# Patient Record
Sex: Male | Born: 1975
Health system: Southern US, Community
[De-identification: ages and names within clinical notes are randomized; demographics above are authoritative.]

## PROBLEM LIST (undated history)

## (undated) DIAGNOSIS — E1165 Type 2 diabetes mellitus with hyperglycemia: Secondary | ICD-10-CM

## (undated) DIAGNOSIS — I509 Heart failure, unspecified: Secondary | ICD-10-CM

## (undated) DIAGNOSIS — G4733 Obstructive sleep apnea (adult) (pediatric): Secondary | ICD-10-CM

## (undated) DIAGNOSIS — I4892 Unspecified atrial flutter: Secondary | ICD-10-CM

## (undated) DIAGNOSIS — Z950 Presence of cardiac pacemaker: Secondary | ICD-10-CM

## (undated) DIAGNOSIS — I2729 Other secondary pulmonary hypertension: Secondary | ICD-10-CM

## (undated) HISTORY — DX: Obstructive sleep apnea (adult) (pediatric): G47.33

## (undated) HISTORY — DX: Type 2 diabetes mellitus with hyperglycemia: E11.65

## (undated) HISTORY — DX: Other secondary pulmonary hypertension: I27.29

## (undated) HISTORY — DX: Unspecified atrial flutter: I48.92

## (undated) HISTORY — DX: Presence of cardiac pacemaker: Z95.0

---

## 2007-03-13 ENCOUNTER — Emergency Department (HOSPITAL_COMMUNITY): Admission: EM | Admit: 2007-03-13 | Discharge: 2007-03-13 | Payer: Self-pay | Admitting: Emergency Medicine

## 2007-05-14 ENCOUNTER — Ambulatory Visit: Payer: Self-pay | Admitting: Family Medicine

## 2008-01-12 ENCOUNTER — Ambulatory Visit: Payer: Self-pay | Admitting: Internal Medicine

## 2011-04-12 LAB — COMPREHENSIVE METABOLIC PANEL
ALT: 19
AST: 25
Albumin: 3.9
CO2: 29
Calcium: 9
Chloride: 103
GFR calc Af Amer: >60
GFR calc non Af Amer: >60
Sodium: 137

## 2011-04-12 LAB — CBC
Platelets: 291
RBC: 4.92
WBC: 7.9

## 2011-04-12 LAB — URINALYSIS, ROUTINE W REFLEX MICROSCOPIC
Bilirubin Urine: NEGATIVE
Glucose, UA: NEGATIVE
Hgb urine dipstick: NEGATIVE
Ketones, ur: NEGATIVE
Leukocytes, UA: NEGATIVE
Protein, ur: 30 — AB
pH: 7

## 2011-04-12 LAB — DIFFERENTIAL
Eosinophils Absolute: 0.2
Eosinophils Relative: 2
Lymphs Abs: 1.4
Monocytes Absolute: 0.3

## 2011-04-12 LAB — URINE MICROSCOPIC-ADD ON

## 2011-04-12 LAB — RAPID URINE DRUG SCREEN, HOSP PERFORMED
Amphetamines: NOT DETECTED
Tetrahydrocannabinol: NOT DETECTED

## 2020-03-16 ENCOUNTER — Other Ambulatory Visit: Payer: Self-pay

## 2020-03-16 ENCOUNTER — Observation Stay (HOSPITAL_COMMUNITY)
Admission: EM | Admit: 2020-03-16 | Discharge: 2020-03-17 | Disposition: A | Discharge status: Home / Self Care | Payer: No Typology Code available for payment source | Attending: Family Medicine | Admitting: Family Medicine

## 2020-03-16 ENCOUNTER — Encounter (HOSPITAL_COMMUNITY): Payer: Self-pay

## 2020-03-16 DIAGNOSIS — H748X2 Other specified disorders of left middle ear and mastoid: Secondary | ICD-10-CM | POA: Diagnosis not present

## 2020-03-16 DIAGNOSIS — R0689 Other abnormalities of breathing: Secondary | ICD-10-CM

## 2020-03-16 DIAGNOSIS — R0602 Shortness of breath: Secondary | ICD-10-CM | POA: Diagnosis not present

## 2020-03-16 DIAGNOSIS — Z20822 Contact with and (suspected) exposure to covid-19: Secondary | ICD-10-CM | POA: Insufficient documentation

## 2020-03-16 DIAGNOSIS — J36 Peritonsillar abscess: Principal | ICD-10-CM

## 2020-03-16 DIAGNOSIS — D72829 Elevated white blood cell count, unspecified: Secondary | ICD-10-CM

## 2020-03-16 DIAGNOSIS — H9202 Otalgia, left ear: Secondary | ICD-10-CM | POA: Diagnosis present

## 2020-03-16 NOTE — ED Triage Notes (Addendum)
Pt arrives to ED w/ c/o L ear pain. Pt reports 8/10 pain. Pt also endorses sore throat. Pt denies fevers. Resp e/u.

## 2020-03-17 ENCOUNTER — Other Ambulatory Visit: Payer: Self-pay

## 2020-03-17 ENCOUNTER — Emergency Department (HOSPITAL_COMMUNITY): Payer: No Typology Code available for payment source

## 2020-03-17 ENCOUNTER — Encounter (HOSPITAL_COMMUNITY): Payer: Self-pay | Admitting: Family Medicine

## 2020-03-17 DIAGNOSIS — D72829 Elevated white blood cell count, unspecified: Secondary | ICD-10-CM

## 2020-03-17 DIAGNOSIS — J36 Peritonsillar abscess: Secondary | ICD-10-CM

## 2020-03-17 LAB — CBC WITH DIFFERENTIAL/PLATELET
Abs Immature Granulocytes: 0.05 10*3/uL (ref 0.00–0.07)
Basophils Absolute: 0.1 10*3/uL (ref 0.0–0.1)
Basophils Relative: 1 %
Eosinophils Absolute: 0.1 10*3/uL (ref 0.0–0.5)
Eosinophils Relative: 1 %
HCT: 43.7 % (ref 39.0–52.0)
Hemoglobin: 14 g/dL (ref 13.0–17.0)
Immature Granulocytes: 0 %
Lymphocytes Relative: 22 %
Lymphs Abs: 2.7 10*3/uL (ref 0.7–4.0)
MCH: 29.8 pg (ref 26.0–34.0)
MCHC: 32 g/dL (ref 30.0–36.0)
MCV: 93 fL (ref 80.0–100.0)
Monocytes Absolute: 0.7 10*3/uL (ref 0.1–1.0)
Monocytes Relative: 6 %
Neutro Abs: 8.7 10*3/uL — ABNORMAL HIGH (ref 1.7–7.7)
Neutrophils Relative %: 70 %
Platelets: 275 10*3/uL (ref 150–400)
RBC: 4.7 MIL/uL (ref 4.22–5.81)
RDW: 14.7 % (ref 11.5–15.5)
WBC: 12.4 10*3/uL — ABNORMAL HIGH (ref 4.0–10.5)
nRBC: 0 % (ref 0.0–0.2)

## 2020-03-17 LAB — HIV ANTIBODY (ROUTINE TESTING W REFLEX): HIV Screen 4th Generation wRfx: NONREACTIVE

## 2020-03-17 LAB — BASIC METABOLIC PANEL
Anion gap: 8 (ref 5–15)
BUN: 11 mg/dL (ref 6–20)
CO2: 24 mmol/L (ref 22–32)
Calcium: 8.6 mg/dL — ABNORMAL LOW (ref 8.9–10.3)
Chloride: 101 mmol/L (ref 98–111)
Creatinine, Ser: 0.76 mg/dL (ref 0.61–1.24)
GFR calc Af Amer: >60 mL/min (ref >60)
GFR calc non Af Amer: >60 mL/min (ref >60)
Glucose, Bld: 172 mg/dL — ABNORMAL HIGH (ref 70–99)
Potassium: 3.9 mmol/L (ref 3.5–5.1)
Sodium: 133 mmol/L — ABNORMAL LOW (ref 135–145)

## 2020-03-17 LAB — GROUP A STREP BY PCR: Group A Strep by PCR: DETECTED — AB

## 2020-03-17 LAB — SARS CORONAVIRUS 2 BY RT PCR (HOSPITAL ORDER, PERFORMED IN ~~LOC~~ HOSPITAL LAB): SARS Coronavirus 2: NEGATIVE

## 2020-03-17 MED ORDER — ACETAMINOPHEN 325 MG PO TABS: 650.0000 mg | ORAL_TABLET | Freq: Four times a day (QID) | ORAL | Status: DC | PRN

## 2020-03-17 MED ORDER — IOHEXOL 300 MG/ML  SOLN
75.0000 mL | Freq: Once | INTRAMUSCULAR | Status: AC | PRN
  Administered 2020-03-17: 75 mL via INTRAVENOUS

## 2020-03-17 MED ORDER — SODIUM CHLORIDE 0.9 % IV BOLUS
500.0000 mL | Freq: Once | INTRAVENOUS | Status: AC
  Administered 2020-03-17: 500 mL via INTRAVENOUS

## 2020-03-17 MED ORDER — DEXAMETHASONE SODIUM PHOSPHATE 10 MG/ML IJ SOLN
10.0000 mg | Freq: Two times a day (BID) | INTRAMUSCULAR | Status: DC
  Administered 2020-03-17: 10 mg via INTRAVENOUS
  Filled 2020-03-17 (×2): qty 1

## 2020-03-17 MED ORDER — ACETAMINOPHEN 650 MG RE SUPP: 650.0000 mg | Freq: Four times a day (QID) | RECTAL | Status: DC | PRN

## 2020-03-17 MED ORDER — DEXAMETHASONE SODIUM PHOSPHATE 10 MG/ML IJ SOLN
10.0000 mg | Freq: Once | INTRAMUSCULAR | Status: AC
  Administered 2020-03-17: 10 mg via INTRAVENOUS
  Filled 2020-03-17: qty 1

## 2020-03-17 MED ORDER — CLINDAMYCIN HCL 300 MG PO CAPS: 300.0000 mg | ORAL_CAPSULE | Freq: Four times a day (QID) | ORAL | 0 refills | Status: AC

## 2020-03-17 MED ORDER — HYDROMORPHONE HCL 1 MG/ML IJ SOLN
1.0000 mg | INTRAMUSCULAR | Status: DC | PRN
  Administered 2020-03-17: 1 mg via INTRAVENOUS
  Filled 2020-03-17: qty 1

## 2020-03-17 MED ORDER — CLINDAMYCIN PHOSPHATE 600 MG/50ML IV SOLN: 600.0000 mg | Freq: Three times a day (TID) | INTRAVENOUS | Status: DC

## 2020-03-17 MED ORDER — CLINDAMYCIN PHOSPHATE 600 MG/50ML IV SOLN
600.0000 mg | Freq: Once | INTRAVENOUS | Status: AC
  Administered 2020-03-17: 600 mg via INTRAVENOUS
  Filled 2020-03-17: qty 50

## 2020-03-17 NOTE — Consult Note (Signed)
Reason for Consult: Left peritonsillar abscess Referring Physician: Dierdre Forth, PA-C  HPI:  Kyle Pearson is an 44 y.o. male who was admitted this morning for treatment of his acute tonsillitis and left peritonsillar abscess. He was complaining of gradual, persistent, progressively worsening of left-sided sore throat for 3 days. Associated symptoms include left otalgia with onset 24 hours ago.  Patient reports pain worsens with talking, swallowing and chewing. He reports his throat feels swollen and his voice has changed.  Additionally endorses left cervical adenopathy.  Patient denies fevers, chills, neck pain, neck stiffness or other concerns.  He is fully vaccinated for Covid. His CT scan shows acute left tonsillitis and a small 10 mm left peritonsillar abscess. The patient was admitted due to his inability to handle his secretion.  History reviewed. No pertinent past medical history.  History reviewed. No pertinent surgical history.  History reviewed. No pertinent family history.  Social History:  reports that he quit smoking about 20 months ago. He has never used smokeless tobacco. He reports previous alcohol use. He reports that he does not use drugs.  Allergies:  Allergies  Allergen Reactions  . Penicillins     Prior to Admission medications   Not on File    Medications:  I have reviewed the patient's current medications. Scheduled: . dexamethasone (DECADRON) injection  10 mg Intravenous Q12H   Continuous: . clindamycin (CLEOCIN) IV      Results for orders placed or performed during the hospital encounter of 03/16/20 (from the past 48 hour(s))  SARS Coronavirus 2 by RT PCR (hospital order, performed in [facility] hospital lab) Nasopharyngeal Nasopharyngeal Swab     Status: None   Collection Time: 03/17/20  1:20 AM   Specimen: Nasopharyngeal Swab  Result Value Ref Range   SARS Coronavirus 2 NEGATIVE NEGATIVE    Comment: (NOTE) SARS-CoV-2 target nucleic  acids are NOT DETECTED.  The SARS-CoV-2 RNA is generally detectable in upper and lower respiratory specimens during the acute phase of infection. The lowest concentration of SARS-CoV-2 viral copies this assay can detect is 250 copies / mL. A negative result does not preclude SARS-CoV-2 infection and should not be used as the sole basis for treatment or other patient management decisions.  A negative result may occur with improper specimen collection / handling, submission of specimen other than nasopharyngeal swab, presence of viral mutation(s) within the areas targeted by this assay, and inadequate number of viral copies (<250 copies / mL). A negative result must be combined with clinical observations, patient history, and epidemiological information.  Fact Sheet for Patients:   BoilerBrush.com.cy  Fact Sheet for Healthcare Providers: https://pope.com/  This test is not yet approved or  cleared by the [place] FDA and has been authorized for detection and/or diagnosis of SARS-CoV-2 by FDA under an Emergency Use Authorization (EUA).  This EUA will remain in effect (meaning this test can be used) for the duration of the COVID-19 declaration under Section 564(b)(1) of the Act, 21 U.S.C. section 360bbb-3(b)(1), unless the authorization is terminated or revoked sooner.  Performed at [facility], [address] [place], [place] [address]   CBC with Differential     Status: Abnormal   Collection Time: 03/17/20  1:20 AM  Result Value Ref Range   WBC 12.4 (H) 4.0 - 10.5 K/uL   RBC 4.70 4.22 - 5.81 MIL/uL   Hemoglobin 14.0 13.0 - 17.0 g/dL   HCT 56.3 39 - 52 %   MCV 93.0 80.0 -  100.0 fL   MCH 29.8 26.0 - 34.0 pg   MCHC 32.0 30.0 - 36.0 g/dL   RDW 20.9 47.0 - 96.2 %   Platelets 275 150 - 400 K/uL   nRBC 0.0 0.0 - 0.2 %   Neutrophils Relative % 70 %   Neutro Abs 8.7 (H) 1.7 - 7.7 K/uL   Lymphocytes Relative 22 %   Lymphs  Abs 2.7 0.7 - 4.0 K/uL   Monocytes Relative 6 %   Monocytes Absolute 0.7 0 - 1 K/uL   Eosinophils Relative 1 %   Eosinophils Absolute 0.1 0 - 0 K/uL   Basophils Relative 1 %   Basophils Absolute 0.1 0 - 0 K/uL   Immature Granulocytes 0 %   Abs Immature Granulocytes 0.05 0.00 - 0.07 K/uL    Comment: Performed at [facility], [address], [place], [place] [address]  Basic metabolic panel     Status: Abnormal   Collection Time: 03/17/20  1:20 AM  Result Value Ref Range   Sodium 133 (L) 135 - 145 mmol/L   Potassium 3.9 3.5 - 5.1 mmol/L   Chloride 101 98 - 111 mmol/L   CO2 24 22 - 32 mmol/L   Glucose, Bld 172 (H) 70 - 99 mg/dL    Comment: Glucose reference range applies only to samples taken after fasting for at least 8 hours.   BUN 11 6 - 20 mg/dL   Creatinine, Ser 9.47 0.61 - 1.24 mg/dL   Calcium 8.6 (L) 8.9 - 10.3 mg/dL   GFR calc non Af Amer >60 >60 mL/min   GFR calc Af Amer >60 >60 mL/min   Anion gap 8 5 - 15    Comment: Performed at [facility], [address] [place], [place] [address]  Group A Strep by PCR     Status: Abnormal   Collection Time: 03/17/20  1:22 AM   Specimen: Nasopharyngeal Swab; Sterile Swab  Result Value Ref Range   Group A Strep by PCR DETECTED (A) NOT DETECTED    Comment: Performed at [facility], [address], [place], [place] [address]    CT Soft Tissue Neck W Contrast  Result Date: 03/17/2020 CLINICAL DATA:  Left ear pain and sore throat EXAM: CT NECK WITH CONTRAST TECHNIQUE: Multidetector CT imaging of the neck was performed using the standard protocol following the bolus administration of intravenous contrast. CONTRAST:  71mL OMNIPAQUE IOHEXOL 300 MG/ML  SOLN COMPARISON:  None. FINDINGS: Pharynx and larynx: The left palatine tonsil is enlarged with ill-defined fluid collection measuring 10 mm. There is a small retropharyngeal effusion but no retropharyngeal abscess. The epiglottis is normal. Normal larynx. Salivary glands: No  inflammation, mass, or stone. Thyroid: Normal. Lymph nodes: Reactive left level 2A cervical lymphadenopathy. Vascular: Negative. Limited intracranial: Negative. Visualized orbits: Negative. Mastoids and visualized paranasal sinuses: Clear. Skeleton: No acute or aggressive process. Upper chest: Negative. Other: None. IMPRESSION: 1. Acute left palatine tonsillitis with 10 mm left peritonsillar abscess. 2. Small retropharyngeal effusion but no retropharyngeal abscess. 3. Reactive left level 2A cervical lymphadenopathy. Electronically Signed   By: Deatra Robinson M.D.   On: 03/17/2020 03:08   Review of Systems  Constitutional: Negative for appetite change, diaphoresis, fatigue, fever and unexpected weight change.  HENT: Positive for ear pain, sore throat, trouble swallowing and voice change. Negative for mouth sores.   Eyes: Negative for visual disturbance.  Respiratory: Negative for cough, chest tightness, shortness of breath and wheezing.  Cardiovascular: Negative for chest pain.  Gastrointestinal: Negative for abdominal pain, constipation, diarrhea, nausea and vomiting.  Endocrine: Negative for polydipsia, polyphagia and polyuria.  Genitourinary: Negative for dysuria, frequency, hematuria and urgency.  Musculoskeletal: Negative for back pain and neck stiffness.  Skin: Negative for rash.  Allergic/Immunologic: Negative for immunocompromised state.  Neurological: Negative for syncope, light-headedness and headaches.  Hematological: Positive for adenopathy. Does not bruise/bleed easily.  Psychiatric/Behavioral: Negative for sleep disturbance. The patient is not nervous/anxious.    Blood pressure 126/87, pulse 94, temperature 98 F (36.7 C), temperature source Oral, resp. rate 18, weight 126 kg, SpO2 95 %. General appearance: alert, cooperative and appears stated age Eyes: Pupils are equal, round, reactive to light. Extraocular motion is intact.  Ears: Examination of the ears shows normal auricles  and external auditory canals bilaterally.  Nose: Nasal examination shows normal mucosa, septum, turbinates.  Face: Facial examination shows no asymmetry. Palpation of the face elicit no significant tenderness.  Mouth: Oral cavity examination shows edematous and erythematous left tonsil. No significant uvula deviation. Neck: No mass or lesion. The trachea is midline. The thyroid is not significantly enlarged.  Neuro: Cranial nerves 2-12 are all grossly in tact.  Assessment/Plan: Left acute tonsillitis with a small peritonsillar abscess. No previous history of recurrent tonsillitis. - Pt reports significant improvement in his throat pain after IV clindamycin and decadron. - May d/c home with clindamycin 300mg  QID for 10 days. - Pt may follow up with me as an outpatient PRN.  Aarish Rockers W Lynsee Wands 03/17/2020, 7:25 AM

## 2020-03-17 NOTE — Discharge Summary (Signed)
Physician Discharge Summary  Kyle Pearson OYD:[id number] DOB: [birth year] DOA: 03/16/2020  PCP: Patient, No Pcp Per  Admit date: 03/16/2020 Discharge date: 03/17/2020  Time spent: 50 minutes  Recommendations for Outpatient Follow-up:  1. Follow-up ENT as needed  Discharge Diagnoses:  Principal Problem:   Peritonsillar abscess Active Problems:   Leukocytosis   Discharge Condition: Stable  Diet recommendation: Regular diet  Filed Weights   03/17/20 0659  Weight: 126 kg    History of present illness:  44 year old male with no medical problems presented with gradually worsening sore throat and swelling mainly on left side.  Patient said that he had sore throat for about a week and for last 3 days pain has become severe and also involved pain in the left ear.  He also had difficulty with swallowing. In the ED CT of the neck showed 10 mm peritonsillar abscess with retropharyngeal effusion but no retropharyngeal abscess on CT scan.  Patient was given Decadron 10 mg IV and started on clindamycin.  ENT was consulted.  Hospital Course:  Left acute tonsillitis/small peritonsillar abscess-patient was seen by ENT, patient had significantly improved with clindamycin and Decadron.  ENT recommends to discharge home on 300 mg p.o. 4 times daily for 10 days.  Patient can follow-up with Dr. Suszanne Conners , ENT as outpatient as needed. Will discharge on clindamycin 3 mg p.o. 4 times daily for 10 days.  Procedures:    Consultations:  ENT  Discharge Exam: Vitals:   03/17/20 0625 03/17/20 0659  BP: (!) 118/93 126/87  Pulse: 93 94  Resp: 17 18  Temp: 99 F (37.2 C) 98 F (36.7 C)  SpO2: 94% 95%    General: Appears in no acute distress Cardiovascular: S1-S2, regular Respiratory: Clear to auscultation bilaterally  Discharge Instructions   Discharge Instructions    Diet - low sodium heart healthy   Complete by: As directed    Increase activity slowly   Complete by: As directed       Allergies as of 03/17/2020      Reactions   Penicillins       Medication List    TAKE these medications   clindamycin 300 MG capsule Commonly known as: CLEOCIN Take 1 capsule (300 mg total) by mouth 4 (four) times daily for 10 days.      Allergies  Allergen Reactions   Penicillins     Follow-up Information    Newman Pies, MD Follow up.   Specialty: Otolaryngology Why: Call as needed Contact information: [address] [place] [address] [phone]                The results of significant diagnostics from this hospitalization (including imaging, microbiology, ancillary and laboratory) are listed below for reference.    Significant Diagnostic Studies: CT Soft Tissue Neck W Contrast  Result Date: 03/17/2020 CLINICAL DATA:  Left ear pain and sore throat EXAM: CT NECK WITH CONTRAST TECHNIQUE: Multidetector CT imaging of the neck was performed using the standard protocol following the bolus administration of intravenous contrast. CONTRAST:  96mL OMNIPAQUE IOHEXOL 300 MG/ML  SOLN COMPARISON:  None. FINDINGS: Pharynx and larynx: The left palatine tonsil is enlarged with ill-defined fluid collection measuring 10 mm. There is a small retropharyngeal effusion but no retropharyngeal abscess. The epiglottis is normal. Normal larynx. Salivary glands: No inflammation, mass, or stone. Thyroid: Normal. Lymph nodes: Reactive left level 2A cervical lymphadenopathy. Vascular: Negative. Limited intracranial: Negative. Visualized orbits: Negative. Mastoids and visualized paranasal sinuses: Clear.  Skeleton: No acute or aggressive process. Upper chest: Negative. Other: None. IMPRESSION: 1. Acute left palatine tonsillitis with 10 mm left peritonsillar abscess. 2. Small retropharyngeal effusion but no retropharyngeal abscess. 3. Reactive left level 2A cervical lymphadenopathy. Electronically Signed   By: Deatra Robinson M.D.   On: 03/17/2020 03:08    Microbiology: Recent Results (from  the past 240 hour(s))  SARS Coronavirus 2 by RT PCR (hospital order, performed in [facility] hospital lab) Nasopharyngeal Nasopharyngeal Swab     Status: None   Collection Time: 03/17/20  1:20 AM   Specimen: Nasopharyngeal Swab  Result Value Ref Range Status   SARS Coronavirus 2 NEGATIVE NEGATIVE Final    Comment: (NOTE) SARS-CoV-2 target nucleic acids are NOT DETECTED.  The SARS-CoV-2 RNA is generally detectable in upper and lower respiratory specimens during the acute phase of infection. The lowest concentration of SARS-CoV-2 viral copies this assay can detect is 250 copies / mL. A negative result does not preclude SARS-CoV-2 infection and should not be used as the sole basis for treatment or other patient management decisions.  A negative result may occur with improper specimen collection / handling, submission of specimen other than nasopharyngeal swab, presence of viral mutation(s) within the areas targeted by this assay, and inadequate number of viral copies (<250 copies / mL). A negative result must be combined with clinical observations, patient history, and epidemiological information.  Fact Sheet for Patients:   BoilerBrush.com.cy  Fact Sheet for Healthcare Providers: https://pope.com/  This test is not yet approved or  cleared by the [place] FDA and has been authorized for detection and/or diagnosis of SARS-CoV-2 by FDA under an Emergency Use Authorization (EUA).  This EUA will remain in effect (meaning this test can be used) for the duration of the COVID-19 declaration under Section 564(b)(1) of the Act, 21 U.S.C. section 360bbb-3(b)(1), unless the authorization is terminated or revoked sooner.  Performed at [facility], [address], [place], [place] [address]   Group A Strep by PCR     Status: Abnormal   Collection Time: 03/17/20  1:22 AM   Specimen: Nasopharyngeal Swab; Sterile Swab  Result Value  Ref Range Status   Group A Strep by PCR DETECTED (A) NOT DETECTED Final    Comment: Performed at [facility], [address] [place], [place] [address]     Labs: Basic Metabolic Panel: Recent Labs  Lab 03/17/20 0120  NA 133*  K 3.9  CL 101  CO2 24  GLUCOSE 172*  BUN 11  CREATININE 0.76  CALCIUM 8.6*   CBC: Recent Labs  Lab 03/17/20 0120  WBC 12.4*  NEUTROABS 8.7*  HGB 14.0  HCT 43.7  MCV 93.0  PLT 275       Signed:  Meredeth Ide MD.  [facility] 03/17/2020, 11:48 AM

## 2020-03-17 NOTE — H&P (Signed)
History and Physical    Kyle Pearson GGY:[id number] DOB: 08/20/1975 DOA: 03/16/2020  PCP: Patient, No Pcp Per   Patient coming from: Home   Chief Complaint: Sore throat, pain with swallowing.   HPI: Kyle Pearson is a 44 y.o. male with no past medical conditions. He presents for evaluation of gradually worsening sore throat that is mostly in the left side.  He reports he has had a sore throat for about a week but the last 3 days the pain in his throat is become worse and he is also developed left ear pain.  He reports pain is worsened when he swallows and eats.  He reports he has not had food gets stuck in his throat.  He also reports it hurts when he lays flat and he feels like he has trouble breathing at times if he lays flat.  He has not had any bleeding or drainage from the back of his throat.  He denies any drainage from his left ear.  He denies any injury or trauma to his throat or ear.  He reports his voice has been hoarse the last 2 days.  He reports the pain is a severe pain that feels like a stabbing sensation when he swallows.  He reports that pain started in the middle of his throat and is moved over to the left side and he has swollen lymph nodes in his neck that are also tender to palpation.  He states he has not had any fevers, chills, chest pain, cough, shortness of breath, abdominal pain, nausea, vomiting, diarrhea. He reports that 2 of his children have had sore throat recently but they got better after a few days he states. He has used Tylenol and Motrin at home intermittently.  He reports he is fully vaccinated for Covid.  ED Course: He is found to have a significantly swollen left tonsil and posterior oropharynx and CT scan shows a 10 mm peritonsillar abscess with retropharyngeal effusion but no retropharyngeal abscess on CT scan.  Patient was given IV antibiotic and steroid in the emergency room.  ENT was consulted by the ER provider.  Hospital service been asked to  evaluate and assist with care of patient.  Review of Systems:  General: Denies weakness, fever, chills, weight loss, night sweats.  Denies dizziness.  Denies change in appetite HENT: Denies head trauma, headache, denies change in hearing, tinnitus.  Denies nasal congestion or bleeding.  Denies sore throat, sores in mouth.  Denies difficulty swallowing Eyes: Denies blurry vision, pain in eye, drainage.  Denies discoloration of eyes. Neck: Denies pain.  Denies swelling.  Denies pain with movement. Cardiovascular: Denies chest pain, palpitations.  Denies edema.  Denies orthopnea Respiratory: Denies shortness of breath, cough.  Denies wheezing.  Denies sputum production Gastrointestinal: Denies abdominal pain, swelling.  Denies nausea, vomiting, diarrhea.  Denies melena.  Denies hematemesis. Musculoskeletal: Denies limitation of movement.  Denies deformity or swelling.  Denies pain.  Denies arthralgias or myalgias. Genitourinary: Denies pelvic pain.  Denies urinary frequency or hesitancy.  Denies dysuria.  Skin: Denies rash.  Denies petechiae, purpura, ecchymosis. Neurological: Denies headache.  Denies syncope.  Denies seizure activity.  Denies weakness or paresthesia.  Denies slurred speech, drooping face.  Denies visual change. Psychiatric: Denies depression, anxiety.  Denies suicidal thoughts or ideation.  Denies hallucinations.  History reviewed. No pertinent past medical history.  History reviewed. No pertinent surgical history.  Social History  reports that he quit smoking about 20 months ago. He  has never used smokeless tobacco. He reports previous alcohol use. He reports that he does not use drugs.  Allergies  Allergen Reactions  . Penicillins     History reviewed. No pertinent family history.   Prior to Admission medications   Not on File    Physical Exam: Vitals:   03/17/20 0124 03/17/20 0130 03/17/20 0145 03/17/20 0204  BP:  (!) 121/102 (!) 129/103   Pulse: 99 95 94    Resp: (!) 23  20   Temp: 98.9 F (37.2 C)   98.2 F (36.8 C)  TempSrc: Oral   Oral  SpO2: 99% 98% 96%     Constitutional: NAD, calm, comfortable Vitals:   03/17/20 0124 03/17/20 0130 03/17/20 0145 03/17/20 0204  BP:  (!) 121/102 (!) 129/103   Pulse: 99 95 94   Resp: (!) 23  20   Temp: 98.9 F (37.2 C)   98.2 F (36.8 C)  TempSrc: Oral   Oral  SpO2: 99% 98% 96%    General: WDWN, Alert and oriented x3.  Eyes: EOMI, PERRL, lids and conjunctivae normal.  Sclera nonicteric HENT:  /AT, external ears normal.  Nares patent without epistasis.  Mucous membranes are moist. Posterior pharynx erythematous with left tonsil being profoundly swollen.  Oropharyngeal opening diminished secondary to swelling in posterior oropharynx.  No exudate or drainage noted.  Normal dentition.  Neck: Soft, normal range of motion, supple, no masses, Trachea midline.  Left anterior lymphadenopathy palpated and mildly tender Respiratory: clear to auscultation bilaterally, no wheezing, no crackles. Normal respiratory effort. No accessory muscle use.  Cardiovascular: Regular rate and rhythm, no murmurs / rubs / gallops. Gastrointestinal: Soft, nontender, nondistended, obese. No masses palpated. Bowel sounds normoactive Musculoskeletal: FROM. no clubbing / cyanosis. Normal muscle tone.  Skin: Warm, dry, intact no rashes, lesions, ulcers. No induration Neurologic: CN 2-12 grossly intact.  Normal speech. Strength 5/5 in all extremities.   Psychiatric: Normal judgment and insight.  Normal mood.    Labs on Admission: I have personally reviewed following labs and imaging studies  CBC: Recent Labs  Lab 03/17/20 0120  WBC 12.4*  NEUTROABS 8.7*  HGB 14.0  HCT 43.7  MCV 93.0  PLT 275    Basic Metabolic Panel: Recent Labs  Lab 03/17/20 0120  NA 133*  K 3.9  CL 101  CO2 24  GLUCOSE 172*  BUN 11  CREATININE 0.76  CALCIUM 8.6*    GFR: CrCl cannot be calculated (Unknown ideal weight.).  Liver  Function Tests: No results for input(s): AST, ALT, ALKPHOS, BILITOT, PROT, ALBUMIN in the last 168 hours.  Urine analysis:    Component Value Date/Time   COLORURINE YELLOW 03/13/2007 1222   APPEARANCEUR CLEAR 03/13/2007 1222   LABSPEC 1.025 03/13/2007 1222   PHURINE 7.0 03/13/2007 1222   GLUCOSEU NEGATIVE 03/13/2007 1222   HGBUR NEGATIVE 03/13/2007 1222   BILIRUBINUR NEGATIVE 03/13/2007 1222   KETONESUR NEGATIVE 03/13/2007 1222   PROTEINUR 30 (A) 03/13/2007 1222   UROBILINOGEN 0.2 03/13/2007 1222   NITRITE NEGATIVE 03/13/2007 1222   LEUKOCYTESUR NEGATIVE 03/13/2007 1222    Radiological Exams on Admission: CT Soft Tissue Neck W Contrast  Result Date: 03/17/2020 CLINICAL DATA:  Left ear pain and sore throat EXAM: CT NECK WITH CONTRAST TECHNIQUE: Multidetector CT imaging of the neck was performed using the standard protocol following the bolus administration of intravenous contrast. CONTRAST:  58mL OMNIPAQUE IOHEXOL 300 MG/ML  SOLN COMPARISON:  None. FINDINGS: Pharynx and larynx: The left palatine tonsil is enlarged  with ill-defined fluid collection measuring 10 mm. There is a small retropharyngeal effusion but no retropharyngeal abscess. The epiglottis is normal. Normal larynx. Salivary glands: No inflammation, mass, or stone. Thyroid: Normal. Lymph nodes: Reactive left level 2A cervical lymphadenopathy. Vascular: Negative. Limited intracranial: Negative. Visualized orbits: Negative. Mastoids and visualized paranasal sinuses: Clear. Skeleton: No acute or aggressive process. Upper chest: Negative. Other: None. IMPRESSION: 1. Acute left palatine tonsillitis with 10 mm left peritonsillar abscess. 2. Small retropharyngeal effusion but no retropharyngeal abscess. 3. Reactive left level 2A cervical lymphadenopathy. Electronically Signed   By: Deatra Robinson M.D.   On: 03/17/2020 03:08    Assessment/Plan Principal Problem:   Peritonsillar abscess Mr. Westley Hummer is placed on local surgical  floor for observation. He is started on clindamycin 600 mg IV every 12 hours antibiotic coverage.  He is also placed on Decadron 10 mg IV every 12 hours to help reduce inflammation. Is been consulted and will evaluate patient in the morning.  ER provider discussed with Dr. Suszanne Conners who stated he will see patient in the morning and did not think patient needed to go to the OR urgently. Patient placed on a soft diet secondary to pain with swallowing.  Active Problems:   Leukocytosis Check CBC in morning    DVT prophylaxis: Padua score is low.  DVT prophylaxis with TED hose and ambulation Code Status:   Full code Family Communication:  Diagnosis plan discussed with patient.  Patient verbalized understanding and agrees with plan.  Further recommendation to follow as clinically indicated Disposition Plan:   Patient is from:  Home  Anticipated DC to:  Home  Anticipated DC date:  Anticipate discharge in less than 2 midnights  Anticipated DC barriers: No barriers to discharge identified at this time  Consults called:  ENT, Dr. Suszanne Conners, consulted by ER provider and will see patient this am.  Admission status:  Observation  Severity of Illness: The appropriate patient status for this patient is OBSERVATION. Observation status is judged to be reasonable and necessary in order to provide the required intensity of service to ensure the patient's safety. The patient's presenting symptoms, physical exam findings, and initial radiographic and laboratory data in the context of their medical condition is felt to place them at decreased risk for further clinical deterioration. Furthermore, it is anticipated that the patient will be medically stable for discharge from the hospital within 2 midnights of admission. The following factors support the patient status of observation.     Claudean Severance Leisel Pinette MD [facility]  How to contact the [facility] Attending or Consulting provider 7A - 7P or covering provider during  after hours 7P -7A, for this patient?   1. Check the care team in [facility] and look for a) attending/consulting [facility] provider listed and b) the [facility] team listed 2. Log into [email or web address] and use [place]'s universal password to access. If you do not have the password, please contact the hospital operator. 3. Locate the [facility] provider you are looking for under [facility] and page to a number that you can be directly reached. 4. If you still have difficulty reaching the provider, please page the [facility] (Director on Call) for the Hospitalists listed on amion for assistance.  03/17/2020, 5:03 AM

## 2020-03-17 NOTE — ED Provider Notes (Signed)
[facility] EMERGENCY DEPARTMENT Provider Note   CSN: [id number] Arrival date & time: 03/16/20  1930     History Chief Complaint  Patient presents with  . Otalgia    Kyle Pearson is a 44 y.o. male with a hx of major medical problems presents to the Emergency Department complaining of gradual, persistent, progressively worsening left-sided sore throat onset 3 days ago. Associated symptoms include left otalgia onset 24 hours ago.  Patient reports pain worsens with talking, swallowing and chewing.  He reports severe pain described as stabbing with swallowing.  He reports his throat feels swollen and his voice is changed.  Additionally endorses left cervical adenopathy.  Patient denies fevers, chills, neck pain, neck stiffness or other concerns.  He is fully vaccinated for Covid.  No known sick contacts.  No treatments prior to arrival.   The history is provided by the patient and medical records. No language interpreter was used.       History reviewed. No pertinent past medical history.  Patient Active Problem List   Diagnosis Date Noted  . Peritonsillar abscess 03/17/2020  . Leukocytosis 03/17/2020    History reviewed. No pertinent surgical history.     No family history on file.  Social History   Tobacco Use  . Smoking status: Not on file  Substance Use Topics  . Alcohol use: Not on file  . Drug use: Not on file    Home Medications Prior to Admission medications   Not on File    Allergies    Penicillins  Review of Systems   Review of Systems  Constitutional: Negative for appetite change, diaphoresis, fatigue, fever and unexpected weight change.  HENT: Positive for ear pain, sore throat, trouble swallowing and voice change. Negative for mouth sores.   Eyes: Negative for visual disturbance.  Respiratory: Negative for cough, chest tightness, shortness of breath and wheezing.   Cardiovascular: Negative for chest pain.  Gastrointestinal:  Negative for abdominal pain, constipation, diarrhea, nausea and vomiting.  Endocrine: Negative for polydipsia, polyphagia and polyuria.  Genitourinary: Negative for dysuria, frequency, hematuria and urgency.  Musculoskeletal: Negative for back pain and neck stiffness.  Skin: Negative for rash.  Allergic/Immunologic: Negative for immunocompromised state.  Neurological: Negative for syncope, light-headedness and headaches.  Hematological: Positive for adenopathy. Does not bruise/bleed easily.  Psychiatric/Behavioral: Negative for sleep disturbance. The patient is not nervous/anxious.     Physical Exam Updated Vital Signs BP (!) 129/103   Pulse 94   Temp 98.2 F (36.8 C) (Oral)   Resp 20   SpO2 96%   Physical Exam Vitals and nursing note reviewed.  Constitutional:      General: He is not in acute distress.    Appearance: He is not diaphoretic.  HENT:     Head: Normocephalic.     Jaw: No trismus.     Right Ear: Tympanic membrane, ear canal and external ear normal. No middle ear effusion.     Left Ear: Ear canal and external ear normal. A middle ear effusion is present. No hemotympanum. Tympanic membrane is not erythematous, retracted or bulging.     Nose: Nose normal.     Right Sinus: No maxillary sinus tenderness or frontal sinus tenderness.     Left Sinus: No maxillary sinus tenderness or frontal sinus tenderness.     Mouth/Throat:     Lips: Pink.     Mouth: Mucous membranes are moist.     Pharynx: Pharyngeal swelling, posterior oropharyngeal erythema and uvula  swelling present. No oropharyngeal exudate.     Tonsils: No tonsillar exudate. 4+ on the left.   Eyes:     General: No scleral icterus.    Conjunctiva/sclera: Conjunctivae normal.  Neck:     Comments: Muffled voice Cardiovascular:     Rate and Rhythm: Normal rate and regular rhythm.     Pulses: Normal pulses.          Radial pulses are 2+ on the right side and 2+ on the left side.  Pulmonary:     Effort: No  tachypnea, accessory muscle usage, prolonged expiration, respiratory distress or retractions.     Breath sounds: No stridor.     Comments: Equal chest rise. No increased work of breathing. Abdominal:     General: There is no distension.     Palpations: Abdomen is soft.     Tenderness: There is no abdominal tenderness. There is no guarding or rebound.  Musculoskeletal:     Cervical back: Normal range of motion.     Comments: Moves all extremities equally and without difficulty.  Lymphadenopathy:     Cervical: Cervical adenopathy present.     Left cervical: Superficial cervical adenopathy present.  Skin:    General: Skin is warm and dry.     Capillary Refill: Capillary refill takes less than 2 seconds.  Neurological:     Mental Status: He is alert.     GCS: GCS eye subscore is 4. GCS verbal subscore is 5. GCS motor subscore is 6.     Comments: Speech is clear and goal oriented.  Psychiatric:        Mood and Affect: Mood normal.     ED Results / Procedures / Treatments   Labs (all labs ordered are listed, but only abnormal results are displayed) Labs Reviewed  GROUP A STREP BY PCR - Abnormal; Notable for the following components:      Result Value   Group A Strep by PCR DETECTED (*)    All other components within normal limits  CBC WITH DIFFERENTIAL/PLATELET - Abnormal; Notable for the following components:   WBC 12.4 (*)    Neutro Abs 8.7 (*)    All other components within normal limits  BASIC METABOLIC PANEL - Abnormal; Notable for the following components:   Sodium 133 (*)    Glucose, Bld 172 (*)    Calcium 8.6 (*)    All other components within normal limits  SARS CORONAVIRUS 2 BY RT PCR (HOSPITAL ORDER, PERFORMED IN [facility])     Radiology CT Soft Tissue Neck W Contrast  Result Date: 03/17/2020 CLINICAL DATA:  Left ear pain and sore throat EXAM: CT NECK WITH CONTRAST TECHNIQUE: Multidetector CT imaging of the neck was performed using the standard  protocol following the bolus administration of intravenous contrast. CONTRAST:  32mL OMNIPAQUE IOHEXOL 300 MG/ML  SOLN COMPARISON:  None. FINDINGS: Pharynx and larynx: The left palatine tonsil is enlarged with ill-defined fluid collection measuring 10 mm. There is a small retropharyngeal effusion but no retropharyngeal abscess. The epiglottis is normal. Normal larynx. Salivary glands: No inflammation, mass, or stone. Thyroid: Normal. Lymph nodes: Reactive left level 2A cervical lymphadenopathy. Vascular: Negative. Limited intracranial: Negative. Visualized orbits: Negative. Mastoids and visualized paranasal sinuses: Clear. Skeleton: No acute or aggressive process. Upper chest: Negative. Other: None. IMPRESSION: 1. Acute left palatine tonsillitis with 10 mm left peritonsillar abscess. 2. Small retropharyngeal effusion but no retropharyngeal abscess. 3. Reactive left level 2A cervical lymphadenopathy. Electronically Signed  By: Deatra Robinson M.D.   On: 03/17/2020 03:08    Procedures Procedures (including critical care time)  Medications Ordered in ED Medications  dexamethasone (DECADRON) injection 10 mg (has no administration in time range)  clindamycin (CLEOCIN) IVPB 600 mg (has no administration in time range)  sodium chloride 0.9 % bolus 500 mL (0 mLs Intravenous Stopped 03/17/20 0204)  iohexol (OMNIPAQUE) 300 MG/ML solution 75 mL (75 mLs Intravenous Contrast Given 03/17/20 0258)    ED Course  I have reviewed the triage vital signs and the nursing notes.  Pertinent labs & imaging results that were available during my care of the patient were reviewed by me and considered in my medical decision making (see chart for details).    MDM Rules/Calculators/A&P                           Presents with unilateral throat swelling and otalgia.  Significant swelling and deviation of the uvula with voice changes.  Concern for possible peritonsillar abscess however with severe swelling and uvula deviation  will obtain CT scan to ensure no deep space infections.  3:45AM CT shows 43mm abscess with edema of the retropharyngeal space.  I personally evaluated he images. Upon reassessment, pt sitting at bedside stating it is becoming harder to breath.  Concern about severe swelling.  Pt will need admission.  Abscess is small, but will discuss with hospitalist and ENT.   4:26 AM  Discussed with Dr. Rachael Darby who will admit.  4:45 AM Discussed with Dr, Suszanne Conners.  He will evaluate in the AM.  Recommends decadron 10mg  q12 hrs.    The patient was discussed with Dr. who agrees with the treatment plan.     Final Clinical Impression(s) / ED Diagnoses Final diagnoses:  Peritonsillar abscess  Difficulty breathing    Rx / DC Orders ED Discharge Orders    None       Blinda Leatherwood 03/17/20 0449    03/19/20, MD 03/17/20 [phone]

## 2020-03-17 NOTE — ED Notes (Signed)
Patient transported to CT 

## 2020-05-29 ENCOUNTER — Inpatient Hospital Stay (HOSPITAL_COMMUNITY)
Admission: EM | Admit: 2020-05-29 | Discharge: 2020-06-06 | DRG: 286 | Disposition: A | Discharge status: Home / Self Care | Payer: No Typology Code available for payment source | Attending: Cardiology | Admitting: Cardiology

## 2020-05-29 ENCOUNTER — Other Ambulatory Visit: Payer: Self-pay

## 2020-05-29 ENCOUNTER — Emergency Department (HOSPITAL_COMMUNITY): Payer: No Typology Code available for payment source

## 2020-05-29 ENCOUNTER — Encounter (HOSPITAL_COMMUNITY): Payer: Self-pay

## 2020-05-29 DIAGNOSIS — I2510 Atherosclerotic heart disease of native coronary artery without angina pectoris: Secondary | ICD-10-CM | POA: Diagnosis present

## 2020-05-29 DIAGNOSIS — Z880 Allergy status to penicillin: Secondary | ICD-10-CM

## 2020-05-29 DIAGNOSIS — I509 Heart failure, unspecified: Secondary | ICD-10-CM | POA: Diagnosis not present

## 2020-05-29 DIAGNOSIS — I514 Myocarditis, unspecified: Secondary | ICD-10-CM | POA: Diagnosis present

## 2020-05-29 DIAGNOSIS — I428 Other cardiomyopathies: Secondary | ICD-10-CM | POA: Diagnosis present

## 2020-05-29 DIAGNOSIS — I248 Other forms of acute ischemic heart disease: Secondary | ICD-10-CM | POA: Diagnosis present

## 2020-05-29 DIAGNOSIS — Z79899 Other long term (current) drug therapy: Secondary | ICD-10-CM | POA: Diagnosis not present

## 2020-05-29 DIAGNOSIS — K7469 Other cirrhosis of liver: Secondary | ICD-10-CM | POA: Diagnosis not present

## 2020-05-29 DIAGNOSIS — J36 Peritonsillar abscess: Secondary | ICD-10-CM | POA: Diagnosis present

## 2020-05-29 DIAGNOSIS — Z20822 Contact with and (suspected) exposure to covid-19: Secondary | ICD-10-CM | POA: Diagnosis present

## 2020-05-29 DIAGNOSIS — Z91018 Allergy to other foods: Secondary | ICD-10-CM | POA: Diagnosis not present

## 2020-05-29 DIAGNOSIS — K761 Chronic passive congestion of liver: Secondary | ICD-10-CM | POA: Diagnosis present

## 2020-05-29 DIAGNOSIS — Z87891 Personal history of nicotine dependence: Secondary | ICD-10-CM

## 2020-05-29 DIAGNOSIS — I5023 Acute on chronic systolic (congestive) heart failure: Secondary | ICD-10-CM | POA: Diagnosis not present

## 2020-05-29 DIAGNOSIS — I313 Pericardial effusion (noninflammatory): Secondary | ICD-10-CM | POA: Diagnosis present

## 2020-05-29 DIAGNOSIS — F1020 Alcohol dependence, uncomplicated: Secondary | ICD-10-CM | POA: Diagnosis present

## 2020-05-29 DIAGNOSIS — I5021 Acute systolic (congestive) heart failure: Secondary | ICD-10-CM | POA: Diagnosis not present

## 2020-05-29 DIAGNOSIS — E66813 Obesity, class 3: Secondary | ICD-10-CM | POA: Diagnosis present

## 2020-05-29 DIAGNOSIS — E876 Hypokalemia: Secondary | ICD-10-CM | POA: Diagnosis present

## 2020-05-29 DIAGNOSIS — G4733 Obstructive sleep apnea (adult) (pediatric): Secondary | ICD-10-CM | POA: Diagnosis present

## 2020-05-29 DIAGNOSIS — I110 Hypertensive heart disease with heart failure: Principal | ICD-10-CM | POA: Diagnosis present

## 2020-05-29 DIAGNOSIS — R0602 Shortness of breath: Secondary | ICD-10-CM | POA: Diagnosis present

## 2020-05-29 DIAGNOSIS — I5043 Acute on chronic combined systolic (congestive) and diastolic (congestive) heart failure: Secondary | ICD-10-CM | POA: Diagnosis not present

## 2020-05-29 DIAGNOSIS — Z6841 Body Mass Index (BMI) 40.0 and over, adult: Secondary | ICD-10-CM

## 2020-05-29 LAB — BASIC METABOLIC PANEL
Anion gap: 7 (ref 5–15)
BUN: 10 mg/dL (ref 6–20)
CO2: 25 mmol/L (ref 22–32)
Calcium: 8.6 mg/dL — ABNORMAL LOW (ref 8.9–10.3)
Chloride: 104 mmol/L (ref 98–111)
Creatinine, Ser: 1.02 mg/dL (ref 0.61–1.24)
GFR, Estimated: >60 mL/min (ref >60)
Glucose, Bld: 122 mg/dL — ABNORMAL HIGH (ref 70–99)
Potassium: 4 mmol/L (ref 3.5–5.1)
Sodium: 136 mmol/L (ref 135–145)

## 2020-05-29 LAB — CBC
HCT: 40.4 % (ref 39.0–52.0)
Hemoglobin: 13 g/dL (ref 13.0–17.0)
MCH: 28.6 pg (ref 26.0–34.0)
MCHC: 32.2 g/dL (ref 30.0–36.0)
MCV: 88.8 fL (ref 80.0–100.0)
Platelets: 216 10*3/uL (ref 150–400)
RBC: 4.55 MIL/uL (ref 4.22–5.81)
RDW: 15.1 % (ref 11.5–15.5)
WBC: 9.3 10*3/uL (ref 4.0–10.5)
nRBC: 0 % (ref 0.0–0.2)

## 2020-05-29 LAB — RESP PANEL BY RT-PCR (FLU A&B, COVID) ARPGX2
Influenza A by PCR: NEGATIVE
Influenza B by PCR: NEGATIVE
SARS Coronavirus 2 by RT PCR: NEGATIVE

## 2020-05-29 LAB — BRAIN NATRIURETIC PEPTIDE: B Natriuretic Peptide: 1020.9 pg/mL — ABNORMAL HIGH (ref 0.0–100.0)

## 2020-05-29 MED ORDER — POTASSIUM CHLORIDE CRYS ER 20 MEQ PO TBCR
20.0000 meq | EXTENDED_RELEASE_TABLET | Freq: Every day | ORAL | Status: DC
  Administered 2020-05-30 – 2020-05-31 (×2): 20 meq via ORAL
  Filled 2020-05-29 (×3): qty 1

## 2020-05-29 MED ORDER — METOPROLOL SUCCINATE ER 25 MG PO TB24
25.0000 mg | ORAL_TABLET | Freq: Every day | ORAL | Status: DC
  Administered 2020-05-30 – 2020-06-06 (×8): 25 mg via ORAL
  Filled 2020-05-29 (×9): qty 1

## 2020-05-29 MED ORDER — BISACODYL 5 MG PO TBEC: 10.0000 mg | DELAYED_RELEASE_TABLET | Freq: Every day | ORAL | Status: DC | PRN

## 2020-05-29 MED ORDER — ADULT MULTIVITAMIN W/MINERALS CH
1.0000 | ORAL_TABLET | Freq: Every day | ORAL | Status: DC
  Administered 2020-05-30 – 2020-06-06 (×8): 1 via ORAL
  Filled 2020-05-29 (×9): qty 1

## 2020-05-29 MED ORDER — FUROSEMIDE 10 MG/ML IJ SOLN
40.0000 mg | Freq: Once | INTRAMUSCULAR | Status: AC
  Administered 2020-05-29: 40 mg via INTRAVENOUS
  Filled 2020-05-29: qty 4

## 2020-05-29 MED ORDER — LISINOPRIL 5 MG PO TABS
5.0000 mg | ORAL_TABLET | Freq: Every day | ORAL | Status: DC
  Administered 2020-05-30 – 2020-05-31 (×2): 5 mg via ORAL
  Filled 2020-05-29 (×2): qty 1

## 2020-05-29 NOTE — ED Provider Notes (Signed)
[facility] EMERGENCY DEPARTMENT Provider Note   CSN: [id number] Arrival date & time: 05/29/20  1235     History Chief Complaint  Patient presents with  . Shortness of Breath  . Congestive Heart Failure    Kyle Pearson is a 44 y.o. male recently admitted to [facility] for shortness of breath in the context of acute HFrEF exacerbation with 2D echocardiogram revealing LVEF 15 to 20%. Patient was volume overloaded on examination with mildly elevated BNP to 562. He was also noted to have a mildly elevated troponin to 54, likely demand ischemia. Patient was successfully diuresed once admitted and plan was for outpatient cardiology follow-up.  I reviewed patient's medical record and he also had a US abdomen complete obtained 04/26/2020 which revealed findings consistent with cirrhosis, but only trace ascites. Plan was for outpatient follow-up with Dr. Karren Burly, [facility], 05/11/2020. He was discharged from the hospital with 40 mg furosemide daily in addition to 25 mg metoprolol.    On my examination, patient reports that he went to his primary care provider, Dr. Wynelle Link with [facility], this morning.  It was only his second time seeing him.  He never followed up with cardiology after his last hospital admission, but instead now has a cardiology appointment with [facility] for 06/02/2020.  He states that he has been taking all of his medications as directed and has been trying to follow a low-salt diet.  He states that he was discharged from the hospital weighing 265 pounds and is now weighing 291 pounds.  He states that 15 of those pounds has been accumulating in the past several days.  He began feeling particularly short of breath roughly 48 hours ago and he suspects that it is attributed to his distended abdomen.  He feels fluid overloaded everywhere.  He denies any chest pain, fevers or chills, nausea or vomiting, hemoptysis,  history of clots or clotting disorder, or other symptoms.  HPI     History reviewed. No pertinent past medical history.  Patient Active Problem List   Diagnosis Date Noted  . Peritonsillar abscess 03/17/2020  . Leukocytosis 03/17/2020    History reviewed. No pertinent surgical history.     No family history on file.  Social History   Tobacco Use  . Smoking status: Former Smoker    Quit date: 2020    Years since quitting: 1.9  . Smokeless tobacco: Never Used  Substance Use Topics  . Alcohol use: Not Currently  . Drug use: Never    Home Medications Prior to Admission medications   Medication Sig Start Date End Date Taking? Authorizing Provider  bisacodyl (DULCOLAX) 5 MG EC tablet Take 10 mg by mouth as needed for mild constipation.    Yes [provider]  furosemide (LASIX) 40 MG tablet Take 40 mg by mouth daily. 04/27/20  Yes [provider]  lisinopril (ZESTRIL) 5 MG tablet Take 5 mg by mouth daily. 04/27/20  Yes [provider]  metoprolol succinate (TOPROL-XL) 25 MG 24 hr tablet Take 25 mg by mouth daily. 04/27/20  Yes [provider]  Multiple Vitamin (MULTI-VITAMIN) tablet Take 1 tablet by mouth daily.   Yes [provider]  potassium chloride SA (KLOR-CON) 20 MEQ tablet Take 20 mEq by mouth daily. 04/27/20  Yes [provider]    Allergies    Raspberry and Penicillins  Review of Systems   Review of Systems  All other systems reviewed  and are negative.   Physical Exam Updated Vital Signs BP 115/89   Pulse 97   Temp 98 F (36.7 C) (Oral)   Resp 18   Ht 4\' 8"  (1.422 m)   Wt 132 kg   SpO2 94%   BMI 65.24 kg/m   Physical Exam Vitals and nursing note reviewed. Exam conducted with a chaperone present.  Constitutional:      Appearance: He is ill-appearing.  HENT:     Head: Normocephalic and atraumatic.  Eyes:     General: No scleral icterus.    Conjunctiva/sclera: Conjunctivae normal.   Cardiovascular:     Rate and Rhythm: Normal rate and regular rhythm.     Pulses: Normal pulses.     Heart sounds: Normal heart sounds.  Pulmonary:     Comments: No significant increased work of breathing.  Mild wheezing auscultated on exam.  No tachypnea or distress. Abdominal:     Comments: Significant protuberance.  No significant focal tenderness or guarding.  Generalized anasarca.  Musculoskeletal:     Right lower leg: Edema present.     Left lower leg: Edema present.     Comments: Significant 3+ bilateral edema.  Skin:    General: Skin is dry.     Capillary Refill: Capillary refill takes less than 2 seconds.  Neurological:     Mental Status: He is alert and oriented to person, place, and time.     GCS: GCS eye subscore is 4. GCS verbal subscore is 5. GCS motor subscore is 6.  Psychiatric:        Mood and Affect: Mood normal.        Behavior: Behavior normal.        Thought Content: Thought content normal.     ED Results / Procedures / Treatments   Labs (all labs ordered are listed, but only abnormal results are displayed) Labs Reviewed  BASIC METABOLIC PANEL - Abnormal; Notable for the following components:      Result Value   Glucose, Bld 122 (*)    Calcium 8.6 (*)    All other components within normal limits  BRAIN NATRIURETIC PEPTIDE - Abnormal; Notable for the following components:   B Natriuretic Peptide 1,020.9 (*)    All other components within normal limits  RESP PANEL BY RT-PCR (FLU A&B, COVID) [id number]  CBC  HEPATIC FUNCTION PANEL    EKG EKG Interpretation  Date/Time:  Monday May 29 2020 12:44:34 EST Ventricular Rate:  114 PR Interval:  188 QRS Duration: 90 QT Interval:  332 QTC Calculation: 457 R Axis:   -97 Text Interpretation: Sinus tachycardia with occasional Premature ventricular complexes Right superior axis deviation Low voltage QRS Septal infarct , age undetermined No previous tracing Confirmed by 12-24-2002 (Gwyneth Sprout) on 05/29/2020  5:27:59 PM   Radiology DG Chest 2 View  Result Date: 05/29/2020 CLINICAL DATA:  Chest pain. EXAM: CHEST - 2 VIEW COMPARISON:  None. FINDINGS: Two views of the chest demonstrate cardiac enlargement. No focal airspace disease or overt pulmonary edema. No large pleural effusions. Trachea is midline. Negative for a pneumothorax. Bone structures are unremarkable. IMPRESSION: Cardiomegaly without acute lung findings. Electronically Signed   By: 05/31/2020 M.D.   On: 05/29/2020 13:11    Procedures Procedures (including critical care time)  Medications Ordered in ED Medications  furosemide (LASIX) injection 40 mg (40 mg Intravenous Given 05/29/20 1619)    ED Course  I have reviewed the triage vital signs and the nursing notes.  Pertinent  labs & imaging results that were available during my care of the patient were reviewed by me and considered in my medical decision making (see chart for details).    MDM Rules/Calculators/A&P                          Patient presents to the ED for shortness of breath and anasarca in the context of severe HFrEF to 15-20% and global hypokinesis. He states that he had been taking his recently prescribed medications, as directed.  He also states that he has been compliant with fluid restrictions and low-salt diet.  He has been checking his weights regularly and noticed a 30 pound weight gain since discharge from the hospital 04/27/2020.  Labs CBC: Unremarkable. BMP: Potassium improved at 4.0. BNP: Elevated at 1,020.9.  Plain films of chest are personally reviewed and demonstrate cardiomegaly without any other acute cardiopulmonary disease.  EKG with sinus tachycardia and intermittent PVCs.  On subsequent evaluation, patient is starting to feel some improvement after 40 mg IV Lasix injection here in the ED.  He has already voided approximately 2 L urine.  However, he remains dyspneic, particular with exertion.  Given his severe HFrEF the 15 to 20% LVEF and  global hypokinesis, suspect that he would benefit from admission for observation and continued diuresis given his acute CHF exacerbation.  Spoke with Dr. Hanley Hays who will see the patient for CHF exacerbation.  Final Clinical Impression(s) / ED Diagnoses Final diagnoses:  Acute on chronic systolic congestive heart failure [facility])    Rx / DC Orders ED Discharge Orders    None       Lorelee New, PA-C 05/29/20 1900    Gwyneth Sprout, MD 05/29/20 2155

## 2020-05-29 NOTE — H&P (Signed)
History and Physical   Kyle Pearson WNU:[id number] DOB: 10-31-1975 DOA: 05/29/2020  Referring MD/NP/PA: Dr. Gwyneth Sprout PCP: Patient, No Pcp Per   Outpatient Specialists: None  Patient coming from: Home  Chief Complaint: Shortness of breath  HPI: Kyle Pearson is a 44 y.o. male with medical history significant of systolic dysfunction CHF recently diagnosed at [facility] with EF of 15 to 20%.  Patient was set up with primary care at [facility].  He was also referred to Dr. Karren Burly of the [facility] heart center.  Patient was also diagnosed with liver cirrhosis on ultrasound on October 27.  No significant ascites at the time.  He has noticed progressive worsening of swelling in his legs his abdomen shortness of breath.  He went to see his new primary care physician today for the second time.  He was set up to have cardiology follow-up on December 3.  He has been taking all his medication but he is frustrated that the swelling has continued.  He was on Lasix 40 mg daily after the discharge.  Patient wants to have follow-up here in [place].  He has gained more than 30 pounds apparently.  All of this within the last month.  His shortness of breath is with exertion.  Some cough but no hemoptysis.  Denied any fever or chills denied any exposure to COVID-19.  Patient is being admitted with decompensated congestive heart failure systolic in nature.  He is to be an alcoholic but has not been drinking.  The cause of his cardiomyopathy has not been determined yet..  ED Course: Temperature 98 blood pressure 134/89 pulse 124 respirate 24 and oxygen sat 89% on room air.Sodium 136 glucose 122 with BNP of 1020.  CBC within normal viral screen negative chest x-ray consistent with congestive heart failure.  Patient being admitted to the hospital for decompensated systolic dysfunction CHF  Review of Systems: As per HPI otherwise 10 point review of systems  negative.    History reviewed. No pertinent past medical history.  History reviewed. No pertinent surgical history.   reports that he quit smoking about 22 months ago. He has never used smokeless tobacco. He reports previous alcohol use. He reports that he does not use drugs.  Allergies  Allergen Reactions  . Raspberry Anaphylaxis  . Penicillins Rash    No family history on file.   Prior to Admission medications   Medication Sig Start Date End Date Taking? Authorizing Provider  bisacodyl (DULCOLAX) 5 MG EC tablet Take 10 mg by mouth as needed for mild constipation.    Yes [provider]  furosemide (LASIX) 40 MG tablet Take 40 mg by mouth daily. 04/27/20  Yes [provider]  lisinopril (ZESTRIL) 5 MG tablet Take 5 mg by mouth daily. 04/27/20  Yes [provider]  metoprolol succinate (TOPROL-XL) 25 MG 24 hr tablet Take 25 mg by mouth daily. 04/27/20  Yes [provider]  Multiple Vitamin (MULTI-VITAMIN) tablet Take 1 tablet by mouth daily.   Yes [provider]  potassium chloride SA (KLOR-CON) 20 MEQ tablet Take 20 mEq by mouth daily. 04/27/20  Yes [provider]    Physical Exam: Vitals:   05/29/20 1930 05/29/20 2000 05/29/20 2030 05/29/20 2130  BP: (!) 120/109 (!) 116/94 113/84 (!) 117/97  Pulse: (!) 124 (!) 101 100 (!) 102  Resp: 17 20 17  (!) 24  Temp:      TempSrc:  SpO2: 100% 99% 99% 94%  Weight:      Height:          Constitutional: Morbidly obese, in mild respiratory distress with anasarca Vitals:   05/29/20 1930 05/29/20 2000 05/29/20 2030 05/29/20 2130  BP: (!) 120/109 (!) 116/94 113/84 (!) 117/97  Pulse: (!) 124 (!) 101 100 (!) 102  Resp: 17 20 17  (!) 24  Temp:      TempSrc:      SpO2: 100% 99% 99% 94%  Weight:      Height:       Eyes: PERRL, lids and conjunctivae normal ENMT: Mucous membranes are moist. Posterior pharynx clear of any exudate or lesions.Normal dentition.  Neck: normal,  supple, no masses, no thyromegaly Respiratory: Coarse breath sound bilaterally with marked crackles at the bases and increased respiratory effort. No accessory muscle use.  Cardiovascular: Sinus tachycardia, no murmurs / rubs / gallops.  2+ extremity edema. 2+ pedal pulses. No carotid bruits.  Abdomen: no tenderness, no masses palpated. No hepatosplenomegaly. Bowel sounds positive.  Musculoskeletal: no clubbing / cyanosis. No joint deformity upper and lower extremities. Good ROM, no contractures. Normal muscle tone.  Skin: no rashes, lesions, ulcers. No induration Neurologic: CN 2-12 grossly intact. Sensation intact, DTR normal. Strength 5/5 in all 4.  Psychiatric: Normal judgment and insight. Alert and oriented x 3. Normal mood.     Labs on Admission: I have personally reviewed following labs and imaging studies  CBC: Recent Labs  Lab 05/29/20 1255  WBC 9.3  HGB 13.0  HCT 40.4  MCV 88.8  PLT 216   Basic Metabolic Panel: Recent Labs  Lab 05/29/20 1255  NA 136  K 4.0  CL 104  CO2 25  GLUCOSE 122*  BUN 10  CREATININE 1.02  CALCIUM 8.6*   GFR: Estimated Creatinine Clearance: 102.1 mL/min (by C-G formula based on SCr of 1.02 mg/dL). Liver Function Tests: No results for input(s): AST, ALT, ALKPHOS, BILITOT, PROT, ALBUMIN in the last 168 hours. No results for input(s): LIPASE, AMYLASE in the last 168 hours. No results for input(s): AMMONIA in the last 168 hours. Coagulation Profile: No results for input(s): INR, PROTIME in the last 168 hours. Cardiac Enzymes: No results for input(s): CKTOTAL, CKMB, CKMBINDEX, TROPONINI in the last 168 hours. BNP (last 3 results) No results for input(s): PROBNP in the last 8760 hours. HbA1C: No results for input(s): HGBA1C in the last 72 hours. CBG: No results for input(s): GLUCAP in the last 168 hours. Lipid Profile: No results for input(s): CHOL, HDL, LDLCALC, TRIG, CHOLHDL, LDLDIRECT in the last 72 hours. Thyroid Function Tests: No  results for input(s): TSH, T4TOTAL, FREET4, T3FREE, THYROIDAB in the last 72 hours. Anemia Panel: No results for input(s): VITAMINB12, FOLATE, FERRITIN, TIBC, IRON, RETICCTPCT in the last 72 hours. Urine analysis:    Component Value Date/Time   COLORURINE YELLOW 03/13/2007 1222   APPEARANCEUR CLEAR 03/13/2007 1222   LABSPEC 1.025 03/13/2007 1222   PHURINE 7.0 03/13/2007 1222   GLUCOSEU NEGATIVE 03/13/2007 1222   HGBUR NEGATIVE 03/13/2007 1222   BILIRUBINUR NEGATIVE 03/13/2007 1222   KETONESUR NEGATIVE 03/13/2007 1222   PROTEINUR 30 (A) 03/13/2007 1222   UROBILINOGEN 0.2 03/13/2007 1222   NITRITE NEGATIVE 03/13/2007 1222   LEUKOCYTESUR NEGATIVE 03/13/2007 1222   Sepsis Labs: @LABRCNTIP (procalcitonin:4,lacticidven:4) ) Recent Results (from the past 240 hour(s))  Resp Panel by RT-PCR (Flu A&B, Covid) Nasopharyngeal Swab     Status: None   Collection Time: 05/29/20  6:53 PM  Specimen: Nasopharyngeal Swab; Nasopharyngeal(NP) swabs in vial transport medium  Result Value Ref Range Status   SARS Coronavirus 2 by RT PCR NEGATIVE NEGATIVE Final    Comment: (NOTE) SARS-CoV-2 target nucleic acids are NOT DETECTED.  The SARS-CoV-2 RNA is generally detectable in upper respiratory specimens during the acute phase of infection. The lowest concentration of SARS-CoV-2 viral copies this assay can detect is 138 copies/mL. A negative result does not preclude SARS-Cov-2 infection and should not be used as the sole basis for treatment or other patient management decisions. A negative result may occur with  improper specimen collection/handling, submission of specimen other than nasopharyngeal swab, presence of viral mutation(s) within the areas targeted by this assay, and inadequate number of viral copies(<138 copies/mL). A negative result must be combined with clinical observations, patient history, and epidemiological information. The expected result is Negative.  Fact Sheet for Patients:   BloggerCourse.com  Fact Sheet for Healthcare Providers:  SeriousBroker.it  This test is no t yet approved or cleared by the [place] FDA and  has been authorized for detection and/or diagnosis of SARS-CoV-2 by FDA under an Emergency Use Authorization (EUA). This EUA will remain  in effect (meaning this test can be used) for the duration of the COVID-19 declaration under Section 564(b)(1) of the Act, 21 U.S.C.section 360bbb-3(b)(1), unless the authorization is terminated  or revoked sooner.       Influenza A by PCR NEGATIVE NEGATIVE Final   Influenza B by PCR NEGATIVE NEGATIVE Final    Comment: (NOTE) The Xpert Xpress SARS-CoV-2/FLU/RSV plus assay is intended as an aid in the diagnosis of influenza from Nasopharyngeal swab specimens and should not be used as a sole basis for treatment. Nasal washings and aspirates are unacceptable for Xpert Xpress SARS-CoV-2/FLU/RSV testing.  Fact Sheet for Patients: BloggerCourse.com  Fact Sheet for Healthcare Providers: SeriousBroker.it  This test is not yet approved or cleared by the [place] FDA and has been authorized for detection and/or diagnosis of SARS-CoV-2 by FDA under an Emergency Use Authorization (EUA). This EUA will remain in effect (meaning this test can be used) for the duration of the COVID-19 declaration under Section 564(b)(1) of the Act, 21 U.S.C. section 360bbb-3(b)(1), unless the authorization is terminated or revoked.  Performed at [facility], [address] [place], [place] [address]      Radiological Exams on Admission: DG Chest 2 View  Result Date: 05/29/2020 CLINICAL DATA:  Chest pain. EXAM: CHEST - 2 VIEW COMPARISON:  None. FINDINGS: Two views of the chest demonstrate cardiac enlargement. No focal airspace disease or overt pulmonary edema. No large pleural effusions. Trachea is midline.  Negative for a pneumothorax. Bone structures are unremarkable. IMPRESSION: Cardiomegaly without acute lung findings. Electronically Signed   By: Richarda Overlie M.D.   On: 05/29/2020 13:11    EKG: Independently reviewed.  It shows sinus tachycardia with occasional premature ventricular complexes with slight right superior axis deviation and low voltage throughout.  Assessment/Plan Principal Problem:   Acute exacerbation of CHF (congestive heart failure) (HCC) Active Problems:   Obesity, Class III, BMI 40-49.9 (morbid obesity) (HCC)   Other cirrhosis of liver (HCC)     #1 acute exacerbation of systolic dysfunction CHF: Patient with acute exacerbation of CHF as indicated.  We will admit the patient and aggressively diuresis.  His EF of 15 to 20% means advanced disease.  Explained to patient that he will always have fluid overload.  He has not been educated on CHF apparently.  He will need to be on beta-blockers ACE inhibitor in addition to the Lasix at least.  Cardiology consultation outpatient was scheduled for this week.  We will get in-house consult and initiate patient on right medications with cardiology follow-up and CHF clinic.  #2 morbid obesity: Dietary counseling especially in the setting of CHF  #3 liver cirrhosis on ultrasound: Probably due to hepatic congestion from CHF.  Continue monitoring   DVT prophylaxis: Lovenox Code Status: Full code Family Communication: No family at bedside Disposition Plan: Home Consults called: None but consult cardiology in the morning Admission status: Inpatient  Severity of Illness: The appropriate patient status for this patient is INPATIENT. Inpatient status is judged to be reasonable and necessary in order to provide the required intensity of service to ensure the patient's safety. The patient's presenting symptoms, physical exam findings, and initial radiographic and laboratory data in the context of their chronic comorbidities is felt to place  them at high risk for further clinical deterioration. Furthermore, it is not anticipated that the patient will be medically stable for discharge from the hospital within 2 midnights of admission. The following factors support the patient status of inpatient.   " The patient's presenting symptoms include shortness of breath and swelling. " The worrisome physical exam findings include anasarca. " The initial radiographic and laboratory data are worrisome because of elevated BNP. " The chronic co-morbidities include systolic CHF.   * I certify that at the point of admission it is my clinical judgment that the patient will require inpatient hospital care spanning beyond 2 midnights from the point of admission due to high intensity of service, high risk for further deterioration and high frequency of surveillance required.Lonia Blood MD [facility] Pager [phone]  If 7PM-7AM, please contact night-coverage [email or web address] Password [facility]  05/29/2020, 10:46 PM

## 2020-05-29 NOTE — ED Triage Notes (Signed)
Pt reports sob, increased leg swelling and and swelling worsening over the past month. Hx of CHF. Saw his pcp this morning and refilled his lasix. Resp e.u at this time.

## 2020-05-30 ENCOUNTER — Inpatient Hospital Stay (HOSPITAL_COMMUNITY): Payer: No Typology Code available for payment source

## 2020-05-30 DIAGNOSIS — I5021 Acute systolic (congestive) heart failure: Secondary | ICD-10-CM

## 2020-05-30 LAB — CBC
HCT: 41.5 % (ref 39.0–52.0)
Hemoglobin: 13.3 g/dL (ref 13.0–17.0)
MCH: 28.7 pg (ref 26.0–34.0)
MCHC: 32 g/dL (ref 30.0–36.0)
MCV: 89.4 fL (ref 80.0–100.0)
Platelets: 220 10*3/uL (ref 150–400)
RBC: 4.64 MIL/uL (ref 4.22–5.81)
RDW: 15.3 % (ref 11.5–15.5)
WBC: 9.7 10*3/uL (ref 4.0–10.5)
nRBC: 0 % (ref 0.0–0.2)

## 2020-05-30 LAB — HEPATIC FUNCTION PANEL
ALT: 27 U/L (ref 0–44)
AST: 47 U/L — ABNORMAL HIGH (ref 15–41)
Albumin: 3.5 g/dL (ref 3.5–5.0)
Alkaline Phosphatase: 60 U/L (ref 38–126)
Bilirubin, Direct: 0.4 mg/dL — ABNORMAL HIGH (ref 0.0–0.2)
Indirect Bilirubin: 1.7 mg/dL — ABNORMAL HIGH (ref 0.3–0.9)
Total Bilirubin: 2.1 mg/dL — ABNORMAL HIGH (ref 0.3–1.2)
Total Protein: 7.3 g/dL (ref 6.5–8.1)

## 2020-05-30 LAB — ECHOCARDIOGRAM COMPLETE
Area-P 1/2: 3 cm2
Height: 56 in
S' Lateral: 6.2 cm
Weight: 4656 oz

## 2020-05-30 LAB — BASIC METABOLIC PANEL
Anion gap: 13 (ref 5–15)
BUN: 11 mg/dL (ref 6–20)
CO2: 27 mmol/L (ref 22–32)
Calcium: 9.1 mg/dL (ref 8.9–10.3)
Chloride: 99 mmol/L (ref 98–111)
Creatinine, Ser: 1.03 mg/dL (ref 0.61–1.24)
GFR, Estimated: >60 mL/min (ref >60)
Glucose, Bld: 102 mg/dL — ABNORMAL HIGH (ref 70–99)
Potassium: 3.6 mmol/L (ref 3.5–5.1)
Sodium: 139 mmol/L (ref 135–145)

## 2020-05-30 LAB — POTASSIUM: Potassium: 3.4 mmol/L — ABNORMAL LOW (ref 3.5–5.1)

## 2020-05-30 MED ORDER — PERFLUTREN LIPID MICROSPHERE
1.0000 mL | INTRAVENOUS | Status: AC | PRN
  Administered 2020-05-30: 2 mL via INTRAVENOUS
  Filled 2020-05-30: qty 10

## 2020-05-30 MED ORDER — ENOXAPARIN SODIUM 40 MG/0.4ML ~~LOC~~ SOLN
40.0000 mg | SUBCUTANEOUS | Status: DC
  Administered 2020-05-30 – 2020-06-01 (×3): 40 mg via SUBCUTANEOUS
  Filled 2020-05-30 (×3): qty 0.4

## 2020-05-30 MED ORDER — SODIUM CHLORIDE 0.9% FLUSH
3.0000 mL | INTRAVENOUS | Status: DC | PRN
  Administered 2020-05-30: 3 mL via INTRAVENOUS

## 2020-05-30 MED ORDER — SODIUM CHLORIDE 0.9% FLUSH
3.0000 mL | Freq: Two times a day (BID) | INTRAVENOUS | Status: DC
  Administered 2020-05-30 – 2020-05-31 (×4): 3 mL via INTRAVENOUS

## 2020-05-30 MED ORDER — ACETAMINOPHEN 325 MG PO TABS
650.0000 mg | ORAL_TABLET | ORAL | Status: DC | PRN
  Administered 2020-05-31 – 2020-06-03 (×6): 650 mg via ORAL
  Filled 2020-05-30 (×6): qty 2

## 2020-05-30 MED ORDER — ONDANSETRON HCL 4 MG/2ML IJ SOLN: 4.0000 mg | Freq: Four times a day (QID) | INTRAMUSCULAR | Status: DC | PRN

## 2020-05-30 MED ORDER — SODIUM CHLORIDE 0.9 % IV SOLN: 250.0000 mL | INTRAVENOUS | Status: DC | PRN

## 2020-05-30 MED ORDER — FUROSEMIDE 10 MG/ML IJ SOLN
40.0000 mg | Freq: Two times a day (BID) | INTRAMUSCULAR | Status: DC
  Administered 2020-05-30 – 2020-05-31 (×3): 40 mg via INTRAVENOUS
  Filled 2020-05-30 (×3): qty 4

## 2020-05-30 NOTE — Progress Notes (Signed)
Pt stated he was having painful arm and leg cramps. MD contacted and labs were drawn. Potassium 3.4  Will continue to monitor

## 2020-05-30 NOTE — Progress Notes (Signed)
PROGRESS NOTE  Kyle Pearson  DOB: Nov 23, 1975  PCP: Patient, No Pcp Per DPO:[id number]  DOA: 05/29/2020  LOS: 1 day   Chief Complaint  Patient presents with  . Shortness of Breath  . Congestive Heart Failure   Brief narrative: Kyle Pearson is a 43 y.o. male with PMH significant for morbid obesity, chronic alcoholic, recently diagnosed with liver cirrhosis, severe systolic CHF, last EF 15 to 20%, admitted at [facility]. At discharge he was started on CHF meds including 40 mg Lasix daily. A new PCP was established and cardiology referral was given for outpatient follow-up. Seen by PCP or the first time on 11/29.  First cardiology appointment was scheduled for December 3.  On 05/29/2020, patient presented with progressively worsening shortness of breath, edema of both legs and abdomen and 30 pound weight gain in a month despite medication compliance.  In the ED, patient was afebrile, heart rate 105, oxygen saturation was 100% on room air. Labs showed BNP more than 1000. EKG with sinus tach at 114 bpm, QTC 457 ms. Chest x-ray showed cardiomegaly without acute lung findings.  Patient was admitted for decompensated systolic CHF.  Subjective: Patient was seen and examined this morning. Lying down in bed.  Was snoring.  Oxygen saturation down to 85% on room air.  On waking up, his oxygen saturation improved to more than 90% Net negative balance of 3 L since admission.  Assessment/Plan: Acute exacerbation of severe systolic CHF  -Recently diagnosed with CHF, EF 15 to 20%. -Presented with worsening shortness of breath, pedal edema and weight gain despite medication compliance. -Home meds include Toprol 25 mg daily, Lasix 40 mg daily, lisinopril 5 mg daily. -Currently on IV Lasix 40 mg daily.  Continue Toprol and lisinopril. -May need further medication optimization by heart failure team. -Continue to monitor intake output, daily weight, blood pressure, renal  function and electrolytes. -New echo ordered  Cardiomyopathy -Ischemic versus nonischemic.  -Defer to cardiology for ischemic evaluation  History of chronic alcoholism. -Reportedly quit a month ago. -Continue monitor for CIWA protocol  Liver cirrhosis -Likely due to alcohol.  -AST only mildly elevated to 47, albumin 3.5.  Morbid obesity - Body mass index is 65.24 kg/m. Patient has been advised to make an attempt to improve diet and exercise patterns to aid in weight loss.  Obstructive sleep apnea -Reports snoring, frequent awakenings at night and not feeling fresh during the day -Recommend sleep study as an outpatient.  Mobility: Encourage ambulation Code Status:   Code Status: Full Code  Nutritional status: Body mass index is 65.24 kg/m.     Diet Order            Diet 2 gram sodium Room service appropriate? Yes; Fluid consistency: Thin  Diet effective now                 DVT prophylaxis: enoxaparin (LOVENOX) injection 40 mg Start: 05/30/20 1000   Antimicrobials:  None Fluid: None Consultants: None Family Communication:  None at bedside  Status is: Inpatient  Remains inpatient appropriate because: Ongoing IV diuresis   Dispo: The patient is from: Home              Anticipated d/c is to: Home              Anticipated d/c date is: 2 to 3 days              Patient currently is not medically stable to d/c.  Infusions:  . sodium chloride      Scheduled Meds: . enoxaparin (LOVENOX) injection  40 mg Subcutaneous Q24H  . furosemide  40 mg Intravenous BID  . lisinopril  5 mg Oral Daily  . metoprolol succinate  25 mg Oral Daily  . multivitamin with minerals  1 tablet Oral Daily  . potassium chloride SA  20 mEq Oral Daily  . sodium chloride flush  3 mL Intravenous Q12H    Antimicrobials: Anti-infectives (From admission, onward)   None      PRN meds: sodium chloride, acetaminophen, bisacodyl, ondansetron (ZOFRAN) IV, sodium chloride flush    Objective: Vitals:   05/30/20 0700 05/30/20 0800  BP: (!) 128/98 (!) 125/95  Pulse: (!) 110 100  Resp: (!) 21 17  Temp:    SpO2: 92% 95%    Intake/Output Summary (Last 24 hours) at 05/30/2020 0950 Last data filed at 05/29/2020 2059 Gross per 24 hour  Intake --  Output 3000 ml  Net -3000 ml   Filed Weights   05/29/20 1250  Weight: 132 kg   Weight change:  Body mass index is 65.24 kg/m.   Physical Exam: General exam: Pleasant.  Not in distress. Skin: No rashes, lesions or ulcers. HEENT: Atraumatic, normocephalic, no obvious bleeding Lungs: Clear to auscultation bilaterally CVS: Slightly tachycardic, no murmur GI/Abd soft, nontender, distended from obesity, bowel sound present CNS: Alert, awake, oriented x3 Psychiatry: Mood appropriate Extremities: Pedal edema 1-2+ bilaterally, no calf tenderness  Data Review: I have personally reviewed the laboratory data and studies available.  Recent Labs  Lab 05/29/20 1255 05/30/20 0359  WBC 9.3 9.7  HGB 13.0 13.3  HCT 40.4 41.5  MCV 88.8 89.4  PLT 216 220   Recent Labs  Lab 05/29/20 1255 05/30/20 0359  NA 136 139  K 4.0 3.6  CL 104 99  CO2 25 27  GLUCOSE 122* 102*  BUN 10 11  CREATININE 1.02 1.03  CALCIUM 8.6* 9.1    F/u labs ordered  Signed, Lorin Glass, MD [facility] 05/30/2020

## 2020-05-30 NOTE — ED Notes (Signed)
Lunch Tray Ordered @ 1028. 

## 2020-05-30 NOTE — Progress Notes (Signed)
  Echocardiogram 2D Echocardiogram has been performed.  Kyle Pearson 05/30/2020, 10:56 AM

## 2020-05-30 NOTE — ED Notes (Addendum)
Patient transported to echo ?

## 2020-05-30 NOTE — ED Notes (Signed)
Patient returned from echo

## 2020-05-30 NOTE — ED Notes (Signed)
Dinner Tray Ordered @ 1714. 

## 2020-05-31 DIAGNOSIS — I5043 Acute on chronic combined systolic (congestive) and diastolic (congestive) heart failure: Secondary | ICD-10-CM | POA: Diagnosis not present

## 2020-05-31 LAB — BASIC METABOLIC PANEL
Anion gap: 12 (ref 5–15)
BUN: 11 mg/dL (ref 6–20)
CO2: 29 mmol/L (ref 22–32)
Calcium: 9.1 mg/dL (ref 8.9–10.3)
Chloride: 95 mmol/L — ABNORMAL LOW (ref 98–111)
Creatinine, Ser: 1.07 mg/dL (ref 0.61–1.24)
GFR, Estimated: >60 mL/min (ref >60)
Glucose, Bld: 115 mg/dL — ABNORMAL HIGH (ref 70–99)
Potassium: 3.5 mmol/L (ref 3.5–5.1)
Sodium: 136 mmol/L (ref 135–145)

## 2020-05-31 LAB — MAGNESIUM: Magnesium: 1.6 mg/dL — ABNORMAL LOW (ref 1.7–2.4)

## 2020-05-31 LAB — PHOSPHORUS: Phosphorus: 4.5 mg/dL (ref 2.5–4.6)

## 2020-05-31 LAB — HIV ANTIBODY (ROUTINE TESTING W REFLEX): HIV Screen 4th Generation wRfx: NONREACTIVE

## 2020-05-31 LAB — PROTIME-INR
INR: 1.3 — ABNORMAL HIGH (ref 0.8–1.2)
Prothrombin Time: 15.7 seconds — ABNORMAL HIGH (ref 11.4–15.2)

## 2020-05-31 MED ORDER — GUAIFENESIN 100 MG/5ML PO SOLN
5.0000 mL | ORAL | Status: DC | PRN
  Administered 2020-05-31 – 2020-06-03 (×4): 100 mg via ORAL
  Filled 2020-05-31 (×4): qty 5

## 2020-05-31 MED ORDER — POTASSIUM CHLORIDE CRYS ER 20 MEQ PO TBCR
40.0000 meq | EXTENDED_RELEASE_TABLET | Freq: Every day | ORAL | Status: DC
  Administered 2020-06-01 – 2020-06-06 (×6): 40 meq via ORAL
  Filled 2020-05-31 (×7): qty 2

## 2020-05-31 MED ORDER — ATORVASTATIN CALCIUM 80 MG PO TABS: 80.0000 mg | ORAL_TABLET | Freq: Every day | ORAL | Status: DC

## 2020-05-31 MED ORDER — ATORVASTATIN CALCIUM 80 MG PO TABS
80.0000 mg | ORAL_TABLET | ORAL | Status: AC
  Administered 2020-05-31: 80 mg via ORAL
  Filled 2020-05-31: qty 1

## 2020-05-31 MED ORDER — MAGNESIUM SULFATE 2 GM/50ML IV SOLN
2.0000 g | Freq: Once | INTRAVENOUS | Status: AC
  Administered 2020-05-31: 2 g via INTRAVENOUS
  Filled 2020-05-31: qty 50

## 2020-05-31 MED ORDER — SODIUM CHLORIDE 0.9 % IV SOLN: INTRAVENOUS | Status: DC

## 2020-05-31 MED ORDER — SODIUM CHLORIDE 0.9 % IV SOLN: 250.0000 mL | INTRAVENOUS | Status: DC | PRN

## 2020-05-31 MED ORDER — FOLIC ACID 1 MG PO TABS
1.0000 mg | ORAL_TABLET | Freq: Every day | ORAL | Status: DC
  Administered 2020-05-31 – 2020-06-06 (×7): 1 mg via ORAL
  Filled 2020-05-31 (×8): qty 1

## 2020-05-31 MED ORDER — LOSARTAN POTASSIUM 25 MG PO TABS
25.0000 mg | ORAL_TABLET | Freq: Every day | ORAL | Status: DC
  Filled 2020-05-31 (×2): qty 1

## 2020-05-31 MED ORDER — SODIUM CHLORIDE 0.9% FLUSH: 3.0000 mL | Freq: Two times a day (BID) | INTRAVENOUS | Status: DC

## 2020-05-31 MED ORDER — SPIRONOLACTONE 12.5 MG HALF TABLET
12.5000 mg | ORAL_TABLET | Freq: Every day | ORAL | Status: DC
  Administered 2020-05-31: 12.5 mg via ORAL
  Filled 2020-05-31 (×2): qty 1

## 2020-05-31 MED ORDER — POTASSIUM CHLORIDE 20 MEQ PO PACK
40.0000 meq | PACK | Freq: Once | ORAL | Status: AC
  Administered 2020-05-31: 40 meq via ORAL
  Filled 2020-05-31: qty 2

## 2020-05-31 MED ORDER — ASPIRIN 81 MG PO CHEW
81.0000 mg | CHEWABLE_TABLET | ORAL | Status: AC
  Administered 2020-06-01: 81 mg via ORAL
  Filled 2020-05-31: qty 1

## 2020-05-31 MED ORDER — SODIUM CHLORIDE 0.9% FLUSH: 3.0000 mL | INTRAVENOUS | Status: DC | PRN

## 2020-05-31 MED ORDER — THIAMINE HCL 100 MG PO TABS
100.0000 mg | ORAL_TABLET | Freq: Every day | ORAL | Status: DC
  Administered 2020-05-31 – 2020-06-06 (×7): 100 mg via ORAL
  Filled 2020-05-31 (×8): qty 1

## 2020-05-31 MED ORDER — FUROSEMIDE 10 MG/ML IJ SOLN
80.0000 mg | Freq: Two times a day (BID) | INTRAMUSCULAR | Status: DC
  Administered 2020-05-31: 80 mg via INTRAVENOUS
  Filled 2020-05-31 (×2): qty 8

## 2020-05-31 MED ORDER — SPIRONOLACTONE 12.5 MG HALF TABLET: 12.5000 mg | ORAL_TABLET | Freq: Every day | ORAL | Status: DC

## 2020-05-31 NOTE — Progress Notes (Addendum)
PROGRESS NOTE  Kyle Pearson  DOB: December 25, 1975  PCP: Patient, No Pcp Per UEA:[id number]  DOA: 05/29/2020  LOS: 2 days   Chief Complaint  Patient presents with  . Shortness of Breath  . Congestive Heart Failure   Brief narrative: Kyle Pearson is a 44 y.o. male with PMH significant for morbid obesity, chronic alcoholic, recently diagnosed with liver cirrhosis, severe systolic CHF, last EF 15 to 20%, admitted at [facility]. At discharge he was started on CHF meds including 40 mg Lasix daily. A new PCP was established and cardiology referral was given for outpatient follow-up. Seen by PCP or the first time on 11/29.  First cardiology appointment was scheduled for December 3.  On 05/29/2020, patient presented with progressively worsening shortness of breath, edema of both legs and abdomen and 30 pound weight gain in a month despite medication compliance.  In the ED, patient was afebrile, heart rate 105, oxygen saturation was 100% on room air. Labs showed BNP more than 1000. EKG with sinus tach at 114 bpm, QTC 457 ms. Chest x-ray showed cardiomegaly without acute lung findings.  Patient was admitted for decompensated systolic CHF.  Subjective: Patient was seen and examined this morning. Lying down in bed.   Not in distress.  Not in supplemental oxygen.   Earlier this morning, patient had cramping pain in his legs.  Potassium was noted to be low at 3.4.  Net negative balance of 1.5 L in 24 hours and 5.3 L since admission.  Assessment/Plan: Acute exacerbation of severe systolic CHF  -Recently diagnosed with CHF, EF 15 to 20%. -Presented with worsening shortness of breath, pedal edema and weight gain despite medication compliance. -Home meds include Toprol 25 mg daily, Lasix 40 mg daily, lisinopril 5 mg daily. -Currently on IV Lasix 40 mg daily.   Net negative balance of 1.5 L in 24 hours and 5.3 L since admission. -Continue Toprol and lisinopril. -Continue  to monitor intake output, daily weight, blood pressure, renal function and electrolytes. -Repeat echo 11/30 showed EF low at less than 20%, global hypokinesis, severely dilated LV cavity and grade 3 diastolic dysfunction. -May need further medication optimization as well as cardiac cath.  Heart failure team consulted.  Hypokalemia/hypomagnesemia -Replacement ordered.  Recheck tomorrow. Recent Labs  Lab 05/29/20 1255 05/30/20 0359 05/30/20 2140 05/31/20 0047  K 4.0 3.6 3.4* 3.5  MG  --   --   --  1.6*  PHOS  --   --   --  4.5   Cardiomyopathy -Ischemic versus nonischemic.  -Defer to cardiology for ischemic evaluation  History of chronic alcoholism. -Reportedly quit a month ago. -Continue monitor for CIWA protocol  Liver cirrhosis -Likely due to alcohol.  -AST only mildly elevated to 47, albumin 3.5.  Morbid obesity - Body mass index is 64.25 kg/m. Patient has been advised to make an attempt to improve diet and exercise patterns to aid in weight loss.  Obstructive sleep apnea -Reports snoring, frequent awakenings at night and not feeling fresh during the day -Recommend sleep study as an outpatient.  Mobility: Encourage ambulation Code Status:   Code Status: Full Code  Nutritional status: Body mass index is 64.25 kg/m.     Diet Order            Diet 2 gram sodium Room service appropriate? Yes; Fluid consistency: Thin  Diet effective now                 DVT prophylaxis: enoxaparin (  LOVENOX) injection 40 mg Start: 05/30/20 1000   Antimicrobials:  None Fluid: None Consultants: None Family Communication:  None at bedside  Status is: Inpatient  Remains inpatient appropriate because: Ongoing IV diuresis, cardiology evaluation   Dispo: The patient is from: Home              Anticipated d/c is to: Home              Anticipated d/c date is: 2 to 3 days              Patient currently is not medically stable to d/c.   Infusions:  . sodium chloride       Scheduled Meds: . enoxaparin (LOVENOX) injection  40 mg Subcutaneous Q24H  . folic acid  1 mg Oral Daily  . furosemide  40 mg Intravenous BID  . lisinopril  5 mg Oral Daily  . metoprolol succinate  25 mg Oral Daily  . multivitamin with minerals  1 tablet Oral Daily  . potassium chloride  40 mEq Oral Once  . [START ON 06/01/2020] potassium chloride SA  40 mEq Oral Daily  . sodium chloride flush  3 mL Intravenous Q12H  . thiamine  100 mg Oral Daily    Antimicrobials: Anti-infectives (From admission, onward)   None      PRN meds: sodium chloride, acetaminophen, bisacodyl, guaiFENesin, ondansetron (ZOFRAN) IV, sodium chloride flush   Objective: Vitals:   05/31/20 0547 05/31/20 0824  BP: 105/86 121/81  Pulse: 94 97  Resp: 15 20  Temp: 98 F (36.7 C) 98.1 F (36.7 C)  SpO2: 96% 96%    Intake/Output Summary (Last 24 hours) at 05/31/2020 1143 Last data filed at 05/31/2020 1047 Gross per 24 hour  Intake 1330 ml  Output 3651 ml  Net -2321 ml   Filed Weights   05/29/20 1250 05/31/20 0002 05/31/20 0500  Weight: 132 kg 130 kg 130 kg   Weight change: -1.95 kg Body mass index is 64.25 kg/m.   Physical Exam: General exam: Pleasant.  Morbidly obese.  Not in distress. Skin: No rashes, lesions or ulcers. HEENT: Atraumatic, normocephalic, no obvious bleeding Lungs: Clear to auscultate bilaterally CVS: Slightly tachycardic, no murmur GI/Abd soft, nontender, distended from obesity, bowel sound present CNS: Alert, awake, oriented x3 Psychiatry: Mood appropriate Extremities: Pedal edema 2+ bilaterally, no calf tenderness  Data Review: I have personally reviewed the laboratory data and studies available.  Recent Labs  Lab 05/29/20 1255 05/30/20 0359  WBC 9.3 9.7  HGB 13.0 13.3  HCT 40.4 41.5  MCV 88.8 89.4  PLT 216 220   Recent Labs  Lab 05/29/20 1255 05/30/20 0359 05/30/20 2140 05/31/20 0047  NA 136 139  --  136  K 4.0 3.6 3.4* 3.5  CL 104 99  --  95*  CO2  25 27  --  29  GLUCOSE 122* 102*  --  115*  BUN 10 11  --  11  CREATININE 1.02 1.03  --  1.07  CALCIUM 8.6* 9.1  --  9.1  MG  --   --   --  1.6*  PHOS  --   --   --  4.5    F/u labs ordered  Signed, Lorin Glass, MD [facility] 05/31/2020

## 2020-05-31 NOTE — Consult Note (Signed)
  Advanced Heart Failure Team Consult Note   Primary Physician: Patient, No Pcp Per PCP-Cardiologist:  No primary care provider on file.  Reason for Consultation: Acute systolic heart failure  HPI:    Kyle Pearson is a 44 y.o. M PMH: tobacco and alcohol use, recent diagnosed with heart failure and cirrhosis.  Kyle Pearson reports no past medical history used to smoke less than 1/2 pack per day and drank about 6 pack of beer on the weekend.  He denies family history of heart disease but reported his father used to drink alcohol heavily but quit as well.  He states in 03/16/2020 he started to develop a cough and didn't want to mix medication with alcohol so he stopped drinking and started to take cough medication.  He noticed one day a very severe pain on his left side and pain with swallowing.  He was diagnosed with peritonsillar abscess and placed on clindamycin (300 mg po QID x 10 days) and decadron.  He reports feeling better throat wise but noticed increase swelling to his abdomen and difficulty breathing.  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  He continued to experience trouble breathing and walking short distances were difficulty.  He went to [facility] on 04/24/2020 for shortness of breath, abdominal distention and left sided chest pain. 2D echo showed EF 15-20% with severe LVH, BNP 562 and troponin 54.  He was started on lisinopril 5 mg, toprol-XL 25 mg PO daily and lasix IV. Blood pressure was low and required albumin 12.5 mg IV x 3 doses to help with BP.  He also had an abdominal ultrasound which showed no significant asites but note mild liver cirrhosis with mild gallbladder wall thickening. He was discharge home on 40 mg PO lasix, KCL 20 mEq, lisinopril 5 and toprol-XL 25.    He presented to 1st PCP appt on 11/29 found to have shortness of breath, bilaterally lower extremity edema and abdominal distention.  Reporting  30lb weight gain despite compliance with medication.  He was sent to [facility], where his BNP was 1020 ([facility] BNP 562), CXR showed cardiomegaly only.  Repeat echo revealed EF < 20%, severe decrease function with global hypokinesis, grade III DD, dilated LA/RA and small pericardial effusion.    He was admitted to Hospitalist services, started on IV lasix 40 mg BID, lisinopril 5 mg and toprol-XL 25 with a total of -5.3L and -5lbs since admission.    Kyle Pearson reports continued shortness of breath w/o change in leg swelling and some non-radiating chest pain and feeling hot with Mg2+ infusion.  Reports having had cath done and told he had a blockage but unable to locate study in the EMR.  He denies hx of carpal tunnel.  Denies family hx of heart disease, reports both parents are healthy just getting older.  Denies recent viral infections, reports getting a covid vaccine in June.    Kyle Pearson is seen today for evaluation of heart failure at the request of Dr. Dahal.    Review of Systems: [y] = yes, [ ] = no   . General: Weight gain [ Y]; Weight loss [ ]; Anorexia [ ]; Fatigue [ ]; Fever [ ]; Chills [ ]; Weakness [ ]  . Cardiac: Chest pain/pressure [ ]; Resting SOB [ ]; Exertional SOB [Y ]; Orthopnea [Y ]; Pedal Edema [Y ]; Palpitations [ ]; Syncope [ ]; Presyncope [ ];   Paroxysmal nocturnal dyspnea[ ]  . Pulmonary: Cough [Y ]; Wheezing[ ]; Hemoptysis[ ]; Sputum [ ]; Snoring [ ]  . GI: Vomiting[ ]; Dysphagia[ ]; Melena[ ]; Hematochezia [ ]; Heartburn[ ]; Abdominal pain [ ]; Constipation [ ]; Diarrhea [ ]; BRBPR [ ]  . GU: Hematuria[ ]; Dysuria [ ]; Nocturia[ ]  . Vascular: Pain in legs with walking [ ]; Pain in feet with lying flat [ ]; Non-healing sores [ ]; Stroke [ ]; TIA [ ]; Slurred speech [ ];  . Neuro: Headaches[ ]; Vertigo[ ]; Seizures[ ]; Paresthesias[ ];Blurred vision [ ]; Diplopia [ ]; Vision changes [ ]  . Ortho/Skin: Arthritis [Y ]; Joint pain [ ]; Muscle pain [ ]; Joint swelling [ ]; Back  Pain [ ]; Rash [ ]  . Psych: Depression[ ]; Anxiety[ ]  . Heme: Bleeding problems [ ]; Clotting disorders [ ]; Anemia [ ]  . Endocrine: Diabetes [ ]; Thyroid dysfunction[ ]  Home Medications Prior to Admission medications   Medication Sig Start Date End Date Taking? Authorizing Provider  bisacodyl (DULCOLAX) 5 MG EC tablet Take 10 mg by mouth as needed for mild constipation.    Yes [provider]  furosemide (LASIX) 40 MG tablet Take 40 mg by mouth daily. 04/27/20  Yes [provider]  lisinopril (ZESTRIL) 5 MG tablet Take 5 mg by mouth daily. 04/27/20  Yes [provider]  metoprolol succinate (TOPROL-XL) 25 MG 24 hr tablet Take 25 mg by mouth daily. 04/27/20  Yes [provider]  Multiple Vitamin (MULTI-VITAMIN) tablet Take 1 tablet by mouth daily.   Yes [provider]  potassium chloride SA (KLOR-CON) 20 MEQ tablet Take 20 mEq by mouth daily. 04/27/20  Yes [provider]    Past Medical History: History reviewed. No pertinent past medical history.  Past Surgical History: History reviewed. No pertinent surgical history.  Family History: No family history on file.  Social History: Social History   Socioeconomic History  . Marital status: Single    Spouse name: Not on file  . Number of children: Not on file  . Years of education: Not on file  . Highest education level: Not on file  Occupational History  . Not on file  Tobacco Use  . Smoking status: Former Smoker    Quit date: 2020    Years since quitting: 1.9  . Smokeless tobacco: Never Used  Substance and Sexual Activity  . Alcohol use: Not Currently  . Drug use: Never  . Sexual activity: Not on file  Other Topics Concern  . Not on file  Social History Narrative  . Not on file   Social Determinants of Health   Financial Resource Strain:   . Difficulty of Paying Living Expenses: Not on file  Food Insecurity:   . Worried About Running Out of Food in the  Last Year: Not on file  . Ran Out of Food in the Last Year: Not on file  Transportation Needs:   . Lack of Transportation (Medical): Not on file  . Lack of Transportation (Non-Medical): Not on file  Physical Activity:   . Days of Exercise per Week: Not on file  . Minutes of Exercise per Session: Not on file  Stress:   . Feeling of Stress : Not on file  Social Connections:   . Frequency of Communication with Friends and Family: Not on file  . Frequency of Social Gatherings with Friends and Family: Not on file  .   Attends Religious Services: Not on file  . Active Member of Clubs or Organizations: Not on file  . Attends Club or Organization Meetings: Not on file  . Marital Status: Not on file    Allergies:  Allergies  Allergen Reactions  . Raspberry Anaphylaxis  . Penicillins Rash    Objective:    Vital Signs:   Temp:  [97.4 F (36.3 C)-98.8 F (37.1 C)] 98.1 F (36.7 C) (12/01 0824) Pulse Rate:  [92-116] 97 (12/01 0824) Resp:  [14-24] 20 (12/01 0824) BP: (105-124)/(81-92) 121/81 (12/01 0824) SpO2:  [94 %-100 %] 96 % (12/01 0824) Weight:  [130 kg] 130 kg (12/01 0500) Last BM Date: 05/30/20  Weight change: Filed Weights   05/29/20 1250 05/31/20 0002 05/31/20 0500  Weight: 132 kg 130 kg 130 kg    Intake/Output:   Intake/Output Summary (Last 24 hours) at 05/31/2020 1005 Last data filed at 05/31/2020 0900 Gross per 24 hour  Intake 1280 ml  Output 2651 ml  Net -1371 ml      Physical Exam    General:  Well appearing. No resp difficulty HEENT: icteric sclera Neck: supple. + JVP . Carotids 2+ bilat; no bruits. No lymphadenopathy or thyromegaly appreciated. Cor: PMI nondisplaced. Regular rate & rhythm. No rubs, gallops or murmurs. Lungs: clear bilaterally.  Tattoo noted to back Abdomen: soft, obese, nontender, nondistended. No bruits or masses. Good bowel sounds. Extremities: no cyanosis, clubbing, rash.  +2 bilaterally lower extremity edema Neuro: alert & oriented  x 3, cranial nerves grossly intact. moves all 4 extremities w/o difficulty. Affect pleasant   Telemetry   1st AVB with PR 21, rates 90s.  Personally reviewed.    EKG    ST with PVC, rates 114, QTc 457.  Personally reviewed.   Labs   Basic Metabolic Panel: Recent Labs  Lab 05/29/20 1255 05/30/20 0359 05/30/20 2140 05/31/20 0047  NA 136 139  --  136  K 4.0 3.6 3.4* 3.5  CL 104 99  --  95*  CO2 25 27  --  29  GLUCOSE 122* 102*  --  115*  BUN 10 11  --  11  CREATININE 1.02 1.03  --  1.07  CALCIUM 8.6* 9.1  --  9.1  MG  --   --   --  1.6*  PHOS  --   --   --  4.5    Liver Function Tests: Recent Labs  Lab 05/30/20 0359  AST 47*  ALT 27  ALKPHOS 60  BILITOT 2.1*  PROT 7.3  ALBUMIN 3.5   No results for input(s): LIPASE, AMYLASE in the last 168 hours. No results for input(s): AMMONIA in the last 168 hours.  CBC: Recent Labs  Lab 05/29/20 1255 05/30/20 0359  WBC 9.3 9.7  HGB 13.0 13.3  HCT 40.4 41.5  MCV 88.8 89.4  PLT 216 220    Cardiac Enzymes: No results for input(s): CKTOTAL, CKMB, CKMBINDEX, TROPONINI in the last 168 hours.  BNP: BNP (last 3 results) Recent Labs    05/29/20 1255  BNP 1,020.9*    ProBNP (last 3 results) No results for input(s): PROBNP in the last 8760 hours.   CBG: No results for input(s): GLUCAP in the last 168 hours.  Coagulation Studies: No results for input(s): LABPROT, INR in the last 72 hours.   Imaging   ECHOCARDIOGRAM COMPLETE  Result Date: 05/30/2020    ECHOCARDIOGRAM REPORT   Patient Name:   Kyle Pearson Date of Exam: 05/30/2020 Medical Rec #:    [id number]           Height:       56.0 in Accession #:    [id number]          Weight:       291.0 lb Date of Birth:  10/03/1975          BSA:          2.082 m Patient Age:    43 years            BP:           131/96 mmHg Patient Gender: M                   HR:           104 bpm. Exam Location:  Inpatient Procedure: 2D Echo, Cardiac Doppler, Color Doppler and  Intracardiac            Opacification Agent STAT ECHO Indications:    CHF  History:        Patient has no prior history of Echocardiogram examinations.                 CHF, Signs/Symptoms:Shortness of Breath and LE edema; Risk                 Factors:morbid obesitry. Alcohol obese, cirrhosis.  Sonographer:    Brooke Strickland Referring Phys: [id number] BINAYA DAHAL  Sonographer Comments: Patient is morbidly obese. Image acquisition challenging due to patient body habitus. IMPRESSIONS  1. Left ventricular ejection fraction, by estimation, is <20%. The left ventricle has severely decreased function. The left ventricle demonstrates global hypokinesis. The left ventricular internal cavity size was severely dilated. Left ventricular diastolic parameters are consistent with Grade III diastolic dysfunction (restrictive).  2. Right ventricular systolic function is severely reduced. The right ventricular size is severely enlarged. There is normal pulmonary artery systolic pressure.  3. Left atrial size was severely dilated.  4. Right atrial size was severely dilated.  5. A small pericardial effusion is present. The pericardial effusion is circumferential.  6. The mitral valve is normal in structure. Mild mitral valve regurgitation. No evidence of mitral stenosis.  7. The aortic valve is tricuspid. There is mild calcification of the aortic valve. There is mild thickening of the aortic valve. Aortic valve regurgitation is trivial. No aortic stenosis is present. Comparison(s): No prior Echocardiogram. Conclusion(s)/Recommendation(s): Severely reduced LV and RV function, per chart this was previously diagnosed. FINDINGS  Left Ventricle: LV thrombus excluded by contrast. Left ventricular ejection fraction, by estimation, is <20%. The left ventricle has severely decreased function. The left ventricle demonstrates global hypokinesis. Definity contrast agent was given IV to  delineate the left ventricular endocardial borders. The left  ventricular internal cavity size was severely dilated. There is no left ventricular hypertrophy. Left ventricular diastolic parameters are consistent with Grade III diastolic dysfunction (restrictive). Right Ventricle: The right ventricular size is severely enlarged. Right vetricular wall thickness was not well visualized. Right ventricular systolic function is severely reduced. There is normal pulmonary artery systolic pressure. The tricuspid regurgitant velocity is 2.62 m/s, and with an assumed right atrial pressure of 8 mmHg, the estimated right ventricular systolic pressure is 35.5 mmHg. Left Atrium: Left atrial size was severely dilated. Right Atrium: Right atrial size was severely dilated. Pericardium: A small pericardial effusion is present. The pericardial effusion is circumferential. Mitral Valve: The mitral valve is normal in structure. Mild mitral valve regurgitation. No evidence of mitral valve stenosis. Tricuspid   Valve: The tricuspid valve is normal in structure. Tricuspid valve regurgitation is mild. Aortic Valve: The aortic valve is tricuspid. There is mild calcification of the aortic valve. There is mild thickening of the aortic valve. Aortic valve regurgitation is trivial. No aortic stenosis is present. Pulmonic Valve: The pulmonic valve was grossly normal. Pulmonic valve regurgitation is trivial. No evidence of pulmonic stenosis. Aorta: The aortic root, ascending aorta and aortic arch are all structurally normal, with no evidence of dilitation or obstruction. Venous: The inferior vena cava was not well visualized. IAS/Shunts: The interatrial septum was not well visualized.  LEFT VENTRICLE PLAX 2D LVIDd:         6.70 cm  Diastology LVIDs:         6.20 cm  LV e' medial:    4.13 cm/s LV PW:         1.10 cm  LV E/e' medial:  15.3 LV IVS:        0.90 cm  LV e' lateral:   11.60 cm/s LVOT diam:     2.80 cm  LV E/e' lateral: 5.4 LV SV:         67 LV SV Index:   32 LVOT Area:     6.16 cm  RIGHT VENTRICLE  RV Basal diam:  5.00 cm RV S prime:     5.98 cm/s TAPSE (M-mode): 2.4 cm LEFT ATRIUM              Index       RIGHT ATRIUM           Index LA diam:        5.50 cm  2.64 cm/m  RA Area:     28.90 cm LA Vol (A2C):   99.5 ml  47.79 ml/m RA Volume:   111.00 ml 53.32 ml/m LA Vol (A4C):   134.0 ml 64.36 ml/m LA Biplane Vol: 123.0 ml 59.08 ml/m  AORTIC VALVE LVOT Vmax:   62.00 cm/s LVOT Vmean:  45.900 cm/s LVOT VTI:    0.109 m  AORTA Ao Root diam: 3.40 cm MITRAL VALVE               TRICUSPID VALVE MV Area (PHT): 3.00 cm    TR Peak grad:   27.5 mmHg MV Decel Time: 253 msec    TR Vmax:        262.00 cm/s MV E velocity: 63.00 cm/s MV A velocity: 30.00 cm/s  SHUNTS MV E/A ratio:  2.10        Systemic VTI:  0.11 m                            Systemic Diam: 2.80 cm Bridgette Christopher MD Electronically signed by Bridgette Christopher MD Signature Date/Time: 05/30/2020/2:14:12 PM    Final       Medications:     Current Medications: . enoxaparin (LOVENOX) injection  40 mg Subcutaneous Q24H  . furosemide  40 mg Intravenous BID  . lisinopril  5 mg Oral Daily  . metoprolol succinate  25 mg Oral Daily  . multivitamin with minerals  1 tablet Oral Daily  . potassium chloride SA  20 mEq Oral Daily  . sodium chloride flush  3 mL Intravenous Q12H     Infusions: . sodium chloride    . magnesium sulfate bolus IVPB 2 g (05/31/20 0907)       Patient Profile   Kyle Pearson is a 43 y.o.   male with PMH significant for morbid obesity, chronic alcoholic, recently diagnosed with liver cirrhosis, severe systolic CHF EF 15 to 20% in October 2021.  Presented to [facility] on 11/29 for shortness of breaht, increase leg swelling.    Assessment/Plan   1. Acute systolic heart failure  -Presented to [facility] 04/24/2020 with shortness of breath, chest pain and abdominal distention, found to have EF 15-20% with severe LVH and global hypokinesis, restrictive filling pattern and reduced right ventricle systolic  function, mild MR/TR did not have cath, was started on lisinopril 5, toprol 25 and lasix 40.  -Chagas test pending at [facility] -Echo 11/30 showed EF < 20%, severe decrease function, global hypokinesis, grade III DD, dilated LA/RA, small pericardial effusion.   -will need R/LHC, set up for tomorrow morning -change lisinopril 5 mg to losartan 25 mg for 36H washout prior to starting entresto -Add thiamine due to hx of ETOH use -check HIV -Add spiro 12.5 mg -Increase lasix 80 BID IV  2. Liver cirrhosis  3. Hx of alcohol use  4.  OSA   Length of Stay: 2  Laura E Liggett, NP  05/31/2020, 10:05 AM  Advanced Heart Failure Team Pager [phone] (M-F; 7a - 4p)  Please contact [facility] for night-coverage after hours (4p -7a ) and weekends on [email or web address]  Patient seen with NP, agree with the above note.   History as above.  Prior to 9/21, normal exercise tolerance and no complaints.  Drank fifth of Jack Daniels in a weekend but only 1-2 drinks the entire rest of the week.  No drugs.  No family history of CHF.  He had a peri-tonsillar abscess in 9/21.  After that, he became steadily more short of breath and orthopneic. In 10/21, admitted to [facility], echo showed EF 15-20%.  Started on Lasix and lisinopril, sent home.  Still short of breath, again steadily worse and sent by his PCP to [facility] ER.    Echo here was reviewed, EF < 20% with severe LV dilation, moderate RVE with severely decreased systolic function, severe biatrial enlargement, mild MR.    General: NAD Neck: JVP 14+ cm, no thyromegaly or thyroid nodule.  Lungs: Clear to auscultation bilaterally with normal respiratory effort. CV: Lateral PMI.  Heart mildly tachy, regular S1/S2, no S3/S4, no murmur.  2+ edema to knees.  No carotid bruit.  Normal pedal pulses.  Abdomen: Soft, nontender, no hepatosplenomegaly, no distention.  Skin: Intact without lesions or rashes.  Neurologic: Alert and oriented x 3.  Psych: Normal  affect. Extremities: No clubbing or cyanosis.  HEENT: Normal.   Assessment/Plan:  1. Acute on chronic systolic CHF: Symptomatic since 9/21 (after episode of peri-tonsillar abscess).  He was a prior drinker (none since 9/21) but does not seem to describe heavy enough ETOH to cause profound cardiomyopathy (though he did have mild cirrhosis on abdominal US at [facility]).  No FH of cardiomyopathy.  Born in [place], has been in [place] x many years.  T cruzi testing sent at [facility].  HIV negative 9/21. He has atypical chest pain, RFs for CAD.  Echo this admission with EF < 20% with severe LV dilation, moderate RVE with severely decreased systolic function, severe biatrial enlargement, mild MR. He remains significantly volume overloaded on exam.  - Increase Lasix to 80 mg IV bid.  - Stop lisinopril, start losartan 25 mg daily.  After 36 hrs, transition to Entresto.  - Add spironolactone 12.5 daily.  - Send TSH.  - He will   need RHC/LHC, arrange for tomorrow. Discussed risks/benefits, he agrees to procedure.  - Cardiac MRI if no significant coronary disease.  2. ETOH: No longer drinks.  Does not seem to have been heavy enough drinker to cause CMP, but he did have early cirrhosis on abdominal US at [facility].  However, with obesity could be NASH.  3. Suspect OSA.   Jamaris Theard 05/31/2020 1:37 PM  

## 2020-05-31 NOTE — H&P (View-Only) (Signed)
Advanced Heart Failure Team Consult Note   Primary Physician: Patient, No Pcp Per PCP-Cardiologist:  No primary care provider on file.  Reason for Consultation: Acute systolic heart failure  HPI:    Kyle Pearson is a 44 y.o. M PMH: tobacco and alcohol use, recent diagnosed with heart failure and cirrhosis.  [name] reports no past medical history used to smoke less than 1/2 pack per day and drank about 6 pack of beer on the weekend.  He denies family history of heart disease but reported his father used to drink alcohol heavily but quit as well.  He states in 03/16/2020 he started to develop a cough and didn't want to mix medication with alcohol so he stopped drinking and started to take cough medication.  He noticed one day a very severe pain on his left side and pain with swallowing.  He was diagnosed with peritonsillar abscess and placed on clindamycin (300 mg po QID x 10 days) and decadron.  He reports feeling better throat wise but noticed increase swelling to his abdomen and difficulty breathing.  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  He continued to experience trouble breathing and walking short distances were difficulty.  He went to [facility] on 04/24/2020 for shortness of breath, abdominal distention and left sided chest pain. 2D echo showed EF 15-20% with severe LVH, BNP 562 and troponin 54.  He was started on lisinopril 5 mg, toprol-XL 25 mg PO daily and lasix IV. Blood pressure was low and required albumin 12.5 mg IV x 3 doses to help with BP.  He also had an abdominal ultrasound which showed no significant asites but note mild liver cirrhosis with mild gallbladder wall thickening. He was discharge home on 40 mg PO lasix, KCL 20 mEq, lisinopril 5 and toprol-XL 25.    He presented to 1st PCP appt on 11/29 found to have shortness of breath, bilaterally lower extremity edema and abdominal distention.  Reporting  30lb weight gain despite compliance with medication.  He was sent to [facility], where his BNP was [facility] BNP 562), CXR showed cardiomegaly only.  Repeat echo revealed EF < 20%, severe decrease function with global hypokinesis, grade III DD, dilated LA/RA and small pericardial effusion.    He was admitted to Hospitalist services, started on IV lasix 40 mg BID, lisinopril 5 mg and toprol-XL 25 with a total of -5.3L and -5lbs since admission.    [name] reports continued shortness of breath w/o change in leg swelling and some non-radiating chest pain and feeling hot with Mg2+ infusion.  Reports having had cath done and told he had a blockage but unable to locate study in the EMR.  He denies hx of carpal tunnel.  Denies family hx of heart disease, reports both parents are healthy just getting older.  Denies recent viral infections, reports getting a covid vaccine in June.    [name] is seen today for evaluation of heart failure at the request of Dr. Pola Corn.    Review of Systems: [y] = yes, [ ]  = no   . General: Weight gain [ Y]; Weight loss [ ] ; Anorexia [ ] ; Fatigue [ ] ; Fever [ ] ; Chills [ ] ; Weakness [ ]   . Cardiac: Chest pain/pressure [ ] ; Resting SOB [ ] ; Exertional SOB [Y ]; Orthopnea ]; Pedal Edema [Y ]; Palpitations [ ] ; Syncope [ ] ; Presyncope [ ] ;  Paroxysmal nocturnal dyspnea[ ]   . Pulmonary: Cough [Y ]; Wheezing[ ] ; Hemoptysis[ ] ; Sputum [ ] ; Snoring [ ]   . GI: Vomiting[ ] ; Dysphagia[ ] ; Melena[ ] ; Hematochezia [ ] ; Heartburn[ ] ; Abdominal pain [ ] ; Constipation [ ] ; Diarrhea [ ] ; BRBPR [ ]   . GU: Hematuria[ ] ; Dysuria [ ] ; Nocturia[ ]   . Vascular: Pain in legs with walking [ ] ; Pain in feet with lying flat [ ] ; Non-healing sores [ ] ; Stroke [ ] ; TIA [ ] ; Slurred speech [ ] ;  . Neuro: Headaches[ ] ; Vertigo[ ] ; Seizures[ ] ; Paresthesias[ ] ;Blurred vision [ ] ; Diplopia [ ] ; Vision changes [ ]   . Ortho/Skin: Arthritis [Y ]; Joint pain [ ] ; Muscle pain [ ] ; Joint swelling [ ] ; Back  Pain [ ] ; Rash [ ]   . Psych: Depression[ ] ; Anxiety[ ]   . Heme: Bleeding problems [ ] ; Clotting disorders [ ] ; Anemia [ ]   . Endocrine: Diabetes [ ] ; Thyroid dysfunction[ ]   Home Medications Prior to Admission medications   Medication Sig Start Date End Date Taking? Authorizing Provider  bisacodyl (DULCOLAX) 5 MG EC tablet Take 10 mg by mouth as needed for mild constipation.    Yes [provider]  furosemide (LASIX) 40 MG tablet Take 40 mg by mouth daily. 04/27/20  Yes [provider]  lisinopril (ZESTRIL) 5 MG tablet Take 5 mg by mouth daily. 04/27/20  Yes [provider]  metoprolol succinate (TOPROL-XL) 25 MG 24 hr tablet Take 25 mg by mouth daily. 04/27/20  Yes [provider]  Multiple Vitamin (MULTI-VITAMIN) tablet Take 1 tablet by mouth daily.   Yes [provider]  potassium chloride SA (KLOR-CON) 20 MEQ tablet Take 20 mEq by mouth daily. 04/27/20  Yes [provider]    Past Medical History: History reviewed. No pertinent past medical history.  Past Surgical History: History reviewed. No pertinent surgical history.  Family History: No family history on file.  Social History: Social History   Socioeconomic History  . Marital status: Single    Spouse name: Not on file  . Number of children: Not on file  . Years of education: Not on file  . Highest education level: Not on file  Occupational History  . Not on file  Tobacco Use  . Smoking status: Former Smoker    Quit date: 2020    Years since quitting: 1.9  . Smokeless tobacco: Never Used  Substance and Sexual Activity  . Alcohol use: Not Currently  . Drug use: Never  . Sexual activity: Not on file  Other Topics Concern  . Not on file  Social History Narrative  . Not on file   Social Determinants of Health   Financial Resource Strain:   . Difficulty of Paying Living Expenses: Not on file  Food Insecurity:   . Worried About Programme researcher, broadcasting/film/video in the  Last Year: Not on file  . Ran Out of Food in the Last Year: Not on file  Transportation Needs:   . Lack of Transportation (Medical): Not on file  . Lack of Transportation (Non-Medical): Not on file  Physical Activity:   . Days of Exercise per Week: Not on file  . Minutes of Exercise per Session: Not on file  Stress:   . Feeling of Stress : Not on file  Social Connections:   . Frequency of Communication with Friends and Family: Not on file  . Frequency of Social Gatherings with Friends and Family: Not on file  .  Attends Religious Services: Not on file  . Active Member of Clubs or Organizations: Not on file  . Attends Banker Meetings: Not on file  . Marital Status: Not on file    Allergies:  Allergies  Allergen Reactions  . Raspberry Anaphylaxis  . Penicillins Rash    Objective:    Vital Signs:   Temp:  [97.4 F (36.3 C)-98.8 F (37.1 C)] 98.1 F (36.7 C) (12/01 0824) Pulse Rate:  [92-116] 97 (12/01 0824) Resp:  [14-24] 20 (12/01 0824) BP: (105-124)/(81-92) 121/81 (12/01 0824) SpO2:  [94 %-100 %] 96 % (12/01 0824) Weight:  [130 kg] 130 kg (12/01 0500) Last BM Date: 05/30/20  Weight change: Filed Weights   05/29/20 1250 05/31/20 0002 05/31/20 0500  Weight: 132 kg 130 kg 130 kg    Intake/Output:   Intake/Output Summary (Last 24 hours) at 05/31/2020 1005 Last data filed at 05/31/2020 0900 Gross per 24 hour  Intake 1280 ml  Output 2651 ml  Net -1371 ml      Physical Exam    General:  Well appearing. No resp difficulty HEENT: icteric sclera Neck: supple. + JVP . Carotids 2+ bilat; no bruits. No lymphadenopathy or thyromegaly appreciated. Cor: PMI nondisplaced. Regular rate & rhythm. No rubs, gallops or murmurs. Lungs: clear bilaterally.  Tattoo noted to back Abdomen: soft, obese, nontender, nondistended. No bruits or masses. Good bowel sounds. Extremities: no cyanosis, clubbing, rash.  +2 bilaterally lower extremity edema Neuro: alert & oriented  x 3, cranial nerves grossly intact. moves all 4 extremities w/o difficulty. Affect pleasant   Telemetry   1st AVB with PR 21, rates 90s.  Personally reviewed.    EKG    ST with PVC, rates 114, QTc 457.  Personally reviewed.   Labs   Basic Metabolic Panel: Recent Labs  Lab 05/29/20 1255 05/30/20 0359 05/30/20 2140 05/31/20 0047  NA 136 139  --  136  K 4.0 3.6 3.4* 3.5  CL 104 99  --  95*  CO2 25 27  --  29  GLUCOSE 122* 102*  --  115*  BUN 10 11  --  11  CREATININE 1.02 1.03  --  1.07  CALCIUM 8.6* 9.1  --  9.1  MG  --   --   --  1.6*  PHOS  --   --   --  4.5    Liver Function Tests: Recent Labs  Lab 05/30/20 0359  AST 47*  ALT 27  ALKPHOS 60  BILITOT 2.1*  PROT 7.3  ALBUMIN 3.5   No results for input(s): LIPASE, AMYLASE in the last 168 hours. No results for input(s): AMMONIA in the last 168 hours.  CBC: Recent Labs  Lab 05/29/20 1255 05/30/20 0359  WBC 9.3 9.7  HGB 13.0 13.3  HCT 40.4 41.5  MCV 88.8 89.4  PLT 216 220    Cardiac Enzymes: No results for input(s): CKTOTAL, CKMB, CKMBINDEX, TROPONINI in the last 168 hours.  BNP: BNP (last 3 results) Recent Labs    05/29/20 1255  BNP 1,020.9*    ProBNP (last 3 results) No results for input(s): PROBNP in the last 8760 hours.   CBG: No results for input(s): GLUCAP in the last 168 hours.  Coagulation Studies: No results for input(s): LABPROT, INR in the last 72 hours.   Imaging   ECHOCARDIOGRAM COMPLETE  Result Date: 05/30/2020    ECHOCARDIOGRAM REPORT   Patient Name:   Kyle Pearson Date of Exam: 05/30/2020 Medical Rec #:  [id number]           Height:       56.0 in Accession #:    [id number]          Weight:       291.0 lb Date of Birth:  Apr 08, 1976          BSA:          2.082 m Patient Age:    43 years            BP:           131/96 mmHg Patient Gender: M                   HR:           104 bpm. Exam Location:  Inpatient Procedure: 2D Echo, Cardiac Doppler, Color Doppler and  Intracardiac            Opacification Agent STAT ECHO Indications:    CHF  History:        Patient has no prior history of Echocardiogram examinations.                 CHF, Signs/Symptoms:Shortness of Breath and LE edema; Risk                 Factors:morbid obesitry. Alcohol obese, cirrhosis.  Sonographer:    Lavenia Atlas Referring Phys: [id number] [facility]  Sonographer Comments: Patient is morbidly obese. Image acquisition challenging due to patient body habitus. IMPRESSIONS  1. Left ventricular ejection fraction, by estimation, is <20%. The left ventricle has severely decreased function. The left ventricle demonstrates global hypokinesis. The left ventricular internal cavity size was severely dilated. Left ventricular diastolic parameters are consistent with Grade III diastolic dysfunction (restrictive).  2. Right ventricular systolic function is severely reduced. The right ventricular size is severely enlarged. There is normal pulmonary artery systolic pressure.  3. Left atrial size was severely dilated.  4. Right atrial size was severely dilated.  5. A small pericardial effusion is present. The pericardial effusion is circumferential.  6. The mitral valve is normal in structure. Mild mitral valve regurgitation. No evidence of mitral stenosis.  7. The aortic valve is tricuspid. There is mild calcification of the aortic valve. There is mild thickening of the aortic valve. Aortic valve regurgitation is trivial. No aortic stenosis is present. Comparison(s): No prior Echocardiogram. Conclusion(s)/Recommendation(s): Severely reduced LV and RV function, per chart this was previously diagnosed. FINDINGS  Left Ventricle: LV thrombus excluded by contrast. Left ventricular ejection fraction, by estimation, is <20%. The left ventricle has severely decreased function. The left ventricle demonstrates global hypokinesis. Definity contrast agent was given IV to  delineate the left ventricular endocardial borders. The left  ventricular internal cavity size was severely dilated. There is no left ventricular hypertrophy. Left ventricular diastolic parameters are consistent with Grade III diastolic dysfunction (restrictive). Right Ventricle: The right ventricular size is severely enlarged. Right vetricular wall thickness was not well visualized. Right ventricular systolic function is severely reduced. There is normal pulmonary artery systolic pressure. The tricuspid regurgitant velocity is 2.62 m/s, and with an assumed right atrial pressure of 8 mmHg, the estimated right ventricular systolic pressure is 35.5 mmHg. Left Atrium: Left atrial size was severely dilated. Right Atrium: Right atrial size was severely dilated. Pericardium: A small pericardial effusion is present. The pericardial effusion is circumferential. Mitral Valve: The mitral valve is normal in structure. Mild mitral valve regurgitation. No evidence of mitral valve stenosis. Tricuspid  Valve: The tricuspid valve is normal in structure. Tricuspid valve regurgitation is mild. Aortic Valve: The aortic valve is tricuspid. There is mild calcification of the aortic valve. There is mild thickening of the aortic valve. Aortic valve regurgitation is trivial. No aortic stenosis is present. Pulmonic Valve: The pulmonic valve was grossly normal. Pulmonic valve regurgitation is trivial. No evidence of pulmonic stenosis. Aorta: The aortic root, ascending aorta and aortic arch are all structurally normal, with no evidence of dilitation or obstruction. Venous: The inferior vena cava was not well visualized. IAS/Shunts: The interatrial septum was not well visualized.  LEFT VENTRICLE PLAX 2D LVIDd:         6.70 cm  Diastology LVIDs:         6.20 cm  LV e' medial:    4.13 cm/s LV PW:         1.10 cm  LV E/e' medial:  15.3 LV IVS:        0.90 cm  LV e' lateral:   11.60 cm/s LVOT diam:     2.80 cm  LV E/e' lateral: 5.4 LV SV:         67 LV SV Index:   32 LVOT Area:     6.16 cm  RIGHT VENTRICLE  RV Basal diam:  5.00 cm RV S prime:     5.98 cm/s TAPSE (M-mode): 2.4 cm LEFT ATRIUM              Index       RIGHT ATRIUM           Index LA diam:        5.50 cm  2.64 cm/m  RA Area:     28.90 cm LA Vol (A2C):   99.5 ml  47.79 ml/m RA Volume:   111.00 ml 53.32 ml/m LA Vol (A4C):   134.0 ml 64.36 ml/m LA Biplane Vol: 123.0 ml 59.08 ml/m  AORTIC VALVE LVOT Vmax:   62.00 cm/s LVOT Vmean:  45.900 cm/s LVOT VTI:    0.109 m  AORTA Ao Root diam: 3.40 cm MITRAL VALVE               TRICUSPID VALVE MV Area (PHT): 3.00 cm    TR Peak grad:   27.5 mmHg MV Decel Time: 253 msec    TR Vmax:        262.00 cm/s MV E velocity: 63.00 cm/s MV A velocity: 30.00 cm/s  SHUNTS MV E/A ratio:  2.10        Systemic VTI:  0.11 m                            Systemic Diam: 2.80 cm Jodelle Red MD Electronically signed by Jodelle Red MD Signature Date/Time: 05/30/2020/2:14:12 PM    Final       Medications:     Current Medications: . enoxaparin (LOVENOX) injection  40 mg Subcutaneous Q24H  . furosemide  40 mg Intravenous BID  . lisinopril  5 mg Oral Daily  . metoprolol succinate  25 mg Oral Daily  . multivitamin with minerals  1 tablet Oral Daily  . potassium chloride SA  20 mEq Oral Daily  . sodium chloride flush  3 mL Intravenous Q12H     Infusions: . sodium chloride    . magnesium sulfate bolus IVPB 2 g (05/31/20 0907)       Patient Profile   Kyle Pearson is a 44 y.o.  male with PMH significant for morbid obesity, chronic alcoholic, recently diagnosed with liver cirrhosis, severe systolic CHF EF 15 to 20% in October 2021.  Presented to [facility] on 11/29 for shortness of breaht, increase leg swelling.    Assessment/Plan   1. Acute systolic heart failure  -Presented to [facility]/2020 with shortness of breath, chest pain and abdominal distention, found to have EF 15-20% with severe LVH and global hypokinesis, restrictive filling pattern and reduced right ventricle systolic  function, mild MR/TR did not have cath, was started on lisinopril 5, toprol 25 and lasix 40.  -Chagas test pending at [facility] -Echo 11/30 showed EF < 20%, severe decrease function, global hypokinesis, grade III DD, dilated LA/RA, small pericardial effusion.   -will need R/LHC, set up for tomorrow morning -change lisinopril 5 mg to losartan 25 mg for 36H washout prior to starting entresto -Add thiamine due to hx of ETOH use -check HIV -Add spiro 12.5 mg -Increase lasix 80 BID IV  2. Liver cirrhosis  3. Hx of alcohol use  4.  OSA   Length of Stay: 2  Terance Ice, NP  05/31/2020, 10:05 AM  Advanced Heart Failure Team Pager [phone] (M-F; 7a - 4p)  Please contact [facility] for night-coverage after hours (4p -7a ) and weekends on [email or web address]  Patient seen with NP, agree with the above note.   History as above.  Prior to 9/21, normal exercise tolerance and no complaints.  Drank fifth of [employer] in a weekend but only 1-2 drinks the entire rest of the week.  No drugs.  No family history of CHF.  He had a peri-tonsillar abscess in 9/21.  After that, he became steadily more short of breath and orthopneic. In 10/21, admitted to [facility], echo showed EF 15-20%.  Started on Lasix and lisinopril, sent home.  Still short of breath, again steadily worse and sent by his PCP to [facility] ER.    Echo here was reviewed, EF < 20% with severe LV dilation, moderate RVE with severely decreased systolic function, severe biatrial enlargement, mild MR.    General: NAD Neck: JVP 14+ cm, no thyromegaly or thyroid nodule.  Lungs: Clear to auscultation bilaterally with normal respiratory effort. CV: Lateral PMI.  Heart mildly tachy, regular S1/S2, no S3/S4, no murmur.  2+ edema to knees.  No carotid bruit.  Normal pedal pulses.  Abdomen: Soft, nontender, no hepatosplenomegaly, no distention.  Skin: Intact without lesions or rashes.  Neurologic: Alert and oriented x 3.  Psych: Normal  affect. Extremities: No clubbing or cyanosis.  HEENT: Normal.   Assessment/Plan:  1. Acute on chronic systolic CHF: Symptomatic since 9/21 (after episode of peri-tonsillar abscess).  He was a prior drinker (none since 9/21) but does not seem to describe heavy enough ETOH to cause profound cardiomyopathy (though he did have mild cirrhosis on abdominal US at [facility]).  No FH of cardiomyopathy.  Born in [place], has been in [place] x many years.  T cruzi testing sent at [facility].  HIV negative 9/21. He has atypical chest pain, RFs for CAD.  Echo this admission with EF < 20% with severe LV dilation, moderate RVE with severely decreased systolic function, severe biatrial enlargement, mild MR. He remains significantly volume overloaded on exam.  - Increase Lasix to 80 mg IV bid.  - Stop lisinopril, start losartan 25 mg daily.  After 36 hrs, transition to [employer].  - Add spironolactone 12.5 daily.  - Send TSH.  - He will  need RHC/LHC, arrange for tomorrow. Discussed risks/benefits, he agrees to procedure.  - Cardiac MRI if no significant coronary disease.  2. ETOH: No longer drinks.  Does not seem to have been heavy enough drinker to cause CMP, but he did have early cirrhosis on abdominal US at [facility].  However, with obesity could be NASH.  3. Suspect OSA.   Marca Ancona 05/31/2020 1:37 PM

## 2020-06-01 ENCOUNTER — Encounter (HOSPITAL_COMMUNITY)
Admission: EM | Disposition: A | Discharge status: Home / Self Care | Payer: Self-pay | Source: Home / Self Care | Attending: Cardiology

## 2020-06-01 ENCOUNTER — Inpatient Hospital Stay: Payer: Self-pay

## 2020-06-01 DIAGNOSIS — I5043 Acute on chronic combined systolic (congestive) and diastolic (congestive) heart failure: Secondary | ICD-10-CM | POA: Diagnosis not present

## 2020-06-01 HISTORY — PX: RIGHT/LEFT HEART CATH AND CORONARY ANGIOGRAPHY: CATH118266

## 2020-06-01 LAB — BASIC METABOLIC PANEL
Anion gap: 14 (ref 5–15)
Anion gap: 11 (ref 5–15)
BUN: 13 mg/dL (ref 6–20)
BUN: 11 mg/dL (ref 6–20)
CO2: 27 mmol/L (ref 22–32)
CO2: 33 mmol/L — ABNORMAL HIGH (ref 22–32)
Calcium: 8.9 mg/dL (ref 8.9–10.3)
Calcium: 9.2 mg/dL (ref 8.9–10.3)
Chloride: 92 mmol/L — ABNORMAL LOW (ref 98–111)
Chloride: 91 mmol/L — ABNORMAL LOW (ref 98–111)
Creatinine, Ser: 1.06 mg/dL (ref 0.61–1.24)
Creatinine, Ser: 1.08 mg/dL (ref 0.61–1.24)
GFR, Estimated: >60 mL/min (ref >60)
GFR, Estimated: >60 mL/min (ref >60)
Glucose, Bld: 107 mg/dL — ABNORMAL HIGH (ref 70–99)
Glucose, Bld: 112 mg/dL — ABNORMAL HIGH (ref 70–99)
Potassium: 3.7 mmol/L (ref 3.5–5.1)
Potassium: 3.2 mmol/L — ABNORMAL LOW (ref 3.5–5.1)
Sodium: 133 mmol/L — ABNORMAL LOW (ref 135–145)
Sodium: 135 mmol/L (ref 135–145)

## 2020-06-01 LAB — MAGNESIUM: Magnesium: 1.7 mg/dL (ref 1.7–2.4)

## 2020-06-01 LAB — POCT I-STAT EG7
Acid-Base Excess: 7 mmol/L — ABNORMAL HIGH (ref 0.0–2.0)
Acid-Base Excess: 5 mmol/L — ABNORMAL HIGH (ref 0.0–2.0)
Bicarbonate: 34.8 mmol/L — ABNORMAL HIGH (ref 20.0–28.0)
Bicarbonate: 32.3 mmol/L — ABNORMAL HIGH (ref 20.0–28.0)
Calcium, Ion: 1.17 mmol/L (ref 1.15–1.40)
Calcium, Ion: 1.09 mmol/L — ABNORMAL LOW (ref 1.15–1.40)
HCT: 42 % (ref 39.0–52.0)
HCT: 41 % (ref 39.0–52.0)
Hemoglobin: 14.3 g/dL (ref 13.0–17.0)
Hemoglobin: 13.9 g/dL (ref 13.0–17.0)
O2 Saturation: 62 %
O2 Saturation: 64 %
Potassium: 3.5 mmol/L (ref 3.5–5.1)
Potassium: 3.4 mmol/L — ABNORMAL LOW (ref 3.5–5.1)
Sodium: 137 mmol/L (ref 135–145)
Sodium: 136 mmol/L (ref 135–145)
TCO2: 37 mmol/L — ABNORMAL HIGH (ref 22–32)
TCO2: 34 mmol/L — ABNORMAL HIGH (ref 22–32)
pCO2, Ven: 59 mmHg (ref 44.0–60.0)
pCO2, Ven: 56.8 mmHg (ref 44.0–60.0)
pH, Ven: 7.378 (ref 7.250–7.430)
pH, Ven: 7.363 (ref 7.250–7.430)
pO2, Ven: 34 mmHg (ref 32.0–45.0)
pO2, Ven: 36 mmHg (ref 32.0–45.0)

## 2020-06-01 LAB — CBC
HCT: 40.1 % (ref 39.0–52.0)
Hemoglobin: 13.2 g/dL (ref 13.0–17.0)
MCH: 28.5 pg (ref 26.0–34.0)
MCHC: 32.9 g/dL (ref 30.0–36.0)
MCV: 86.6 fL (ref 80.0–100.0)
Platelets: 216 10*3/uL (ref 150–400)
RBC: 4.63 MIL/uL (ref 4.22–5.81)
RDW: 14.6 % (ref 11.5–15.5)
WBC: 7.8 10*3/uL (ref 4.0–10.5)
nRBC: 0 % (ref 0.0–0.2)

## 2020-06-01 LAB — TSH: TSH: 2.095 u[IU]/mL (ref 0.350–4.500)

## 2020-06-01 SURGERY — RIGHT/LEFT HEART CATH AND CORONARY ANGIOGRAPHY
Anesthesia: LOCAL

## 2020-06-01 MED ORDER — SODIUM CHLORIDE 0.9% FLUSH: 3.0000 mL | Freq: Two times a day (BID) | INTRAVENOUS | Status: DC

## 2020-06-01 MED ORDER — DIGOXIN 125 MCG PO TABS
0.1250 mg | ORAL_TABLET | Freq: Every day | ORAL | Status: DC
  Administered 2020-06-01 – 2020-06-06 (×6): 0.125 mg via ORAL
  Filled 2020-06-01 (×6): qty 1

## 2020-06-01 MED ORDER — METOLAZONE 2.5 MG PO TABS
2.5000 mg | ORAL_TABLET | Freq: Once | ORAL | Status: AC
  Administered 2020-06-01: 2.5 mg via ORAL
  Filled 2020-06-01: qty 1

## 2020-06-01 MED ORDER — FUROSEMIDE 10 MG/ML IJ SOLN
INTRAMUSCULAR | Status: AC
  Filled 2020-06-01: qty 4

## 2020-06-01 MED ORDER — SODIUM CHLORIDE 0.9% FLUSH: 3.0000 mL | INTRAVENOUS | Status: DC | PRN

## 2020-06-01 MED ORDER — SODIUM CHLORIDE 0.9% FLUSH
10.0000 mL | Freq: Two times a day (BID) | INTRAVENOUS | Status: DC
  Administered 2020-06-02 – 2020-06-06 (×8): 10 mL

## 2020-06-01 MED ORDER — FUROSEMIDE 10 MG/ML IJ SOLN
INTRAMUSCULAR | Status: DC | PRN
  Administered 2020-06-01: 80 mg via INTRAVENOUS

## 2020-06-01 MED ORDER — HEPARIN (PORCINE) IN NACL 1000-0.9 UT/500ML-% IV SOLN
INTRAVENOUS | Status: AC
  Filled 2020-06-01: qty 500

## 2020-06-01 MED ORDER — IOHEXOL 350 MG/ML SOLN
INTRAVENOUS | Status: DC | PRN
  Administered 2020-06-01: 30 mL

## 2020-06-01 MED ORDER — LIDOCAINE HCL (PF) 1 % IJ SOLN
INTRAMUSCULAR | Status: DC | PRN
  Administered 2020-06-01 (×2): 2 mL

## 2020-06-01 MED ORDER — ONDANSETRON HCL 4 MG/2ML IJ SOLN: 4.0000 mg | Freq: Four times a day (QID) | INTRAMUSCULAR | Status: DC | PRN

## 2020-06-01 MED ORDER — MILRINONE LACTATE IN DEXTROSE 20-5 MG/100ML-% IV SOLN
0.1250 ug/kg/min | INTRAVENOUS | Status: DC
  Administered 2020-06-01 – 2020-06-03 (×6): 0.25 ug/kg/min via INTRAVENOUS
  Administered 2020-06-04: 0.125 ug/kg/min via INTRAVENOUS
  Filled 2020-06-01 (×7): qty 100

## 2020-06-01 MED ORDER — MAGNESIUM SULFATE 2 GM/50ML IV SOLN: 2.0000 g | Freq: Once | INTRAVENOUS | Status: DC

## 2020-06-01 MED ORDER — VERAPAMIL HCL 2.5 MG/ML IV SOLN
INTRAVENOUS | Status: DC | PRN
  Administered 2020-06-01: 10 mL via INTRA_ARTERIAL

## 2020-06-01 MED ORDER — FENTANYL CITRATE (PF) 100 MCG/2ML IJ SOLN
INTRAMUSCULAR | Status: AC
  Filled 2020-06-01: qty 2

## 2020-06-01 MED ORDER — ACETAMINOPHEN 325 MG PO TABS: 650.0000 mg | ORAL_TABLET | ORAL | Status: DC | PRN

## 2020-06-01 MED ORDER — SODIUM CHLORIDE 0.9 % IV SOLN: 250.0000 mL | INTRAVENOUS | Status: DC | PRN

## 2020-06-01 MED ORDER — FUROSEMIDE 10 MG/ML IJ SOLN
12.0000 mg/h | INTRAVENOUS | Status: DC
  Administered 2020-06-01 – 2020-06-03 (×4): 12 mg/h via INTRAVENOUS
  Filled 2020-06-01 (×5): qty 20

## 2020-06-01 MED ORDER — DAPAGLIFLOZIN PROPANEDIOL 10 MG PO TABS
10.0000 mg | ORAL_TABLET | Freq: Every day | ORAL | Status: DC
  Administered 2020-06-02 – 2020-06-06 (×5): 10 mg via ORAL
  Filled 2020-06-01 (×7): qty 1

## 2020-06-01 MED ORDER — HEPARIN (PORCINE) IN NACL 1000-0.9 UT/500ML-% IV SOLN
INTRAVENOUS | Status: DC | PRN
  Administered 2020-06-01 (×2): 500 mL

## 2020-06-01 MED ORDER — LIDOCAINE HCL (PF) 1 % IJ SOLN
INTRAMUSCULAR | Status: AC
  Filled 2020-06-01: qty 30

## 2020-06-01 MED ORDER — CHLORHEXIDINE GLUCONATE CLOTH 2 % EX PADS
6.0000 | MEDICATED_PAD | Freq: Every day | CUTANEOUS | Status: DC
  Administered 2020-06-02 – 2020-06-06 (×5): 6 via TOPICAL

## 2020-06-01 MED ORDER — FENTANYL CITRATE (PF) 100 MCG/2ML IJ SOLN
INTRAMUSCULAR | Status: DC | PRN
  Administered 2020-06-01: 25 ug via INTRAVENOUS

## 2020-06-01 MED ORDER — ENOXAPARIN SODIUM 40 MG/0.4ML ~~LOC~~ SOLN
40.0000 mg | SUBCUTANEOUS | Status: DC
  Administered 2020-06-02: 40 mg via SUBCUTANEOUS
  Filled 2020-06-01: qty 0.4

## 2020-06-01 MED ORDER — LABETALOL HCL 5 MG/ML IV SOLN: 10.0000 mg | INTRAVENOUS | Status: AC | PRN

## 2020-06-01 MED ORDER — HYDRALAZINE HCL 20 MG/ML IJ SOLN: 10.0000 mg | INTRAMUSCULAR | Status: AC | PRN

## 2020-06-01 MED ORDER — VERAPAMIL HCL 2.5 MG/ML IV SOLN
INTRAVENOUS | Status: AC
  Filled 2020-06-01: qty 2

## 2020-06-01 MED ORDER — MIDAZOLAM HCL 2 MG/2ML IJ SOLN
INTRAMUSCULAR | Status: AC
  Filled 2020-06-01: qty 2

## 2020-06-01 MED ORDER — HEPARIN SODIUM (PORCINE) 1000 UNIT/ML IJ SOLN
INTRAMUSCULAR | Status: DC | PRN
  Administered 2020-06-01: 6000 [IU] via INTRAVENOUS

## 2020-06-01 MED ORDER — HEPARIN SODIUM (PORCINE) 1000 UNIT/ML IJ SOLN
INTRAMUSCULAR | Status: AC
  Filled 2020-06-01: qty 1

## 2020-06-01 MED ORDER — SODIUM CHLORIDE 0.9% FLUSH: 10.0000 mL | INTRAVENOUS | Status: DC | PRN

## 2020-06-01 MED ORDER — SPIRONOLACTONE 25 MG PO TABS
25.0000 mg | ORAL_TABLET | Freq: Every day | ORAL | Status: DC
  Administered 2020-06-01 – 2020-06-06 (×6): 25 mg via ORAL
  Filled 2020-06-01 (×6): qty 1

## 2020-06-01 MED ORDER — MIDAZOLAM HCL 2 MG/2ML IJ SOLN
INTRAMUSCULAR | Status: DC | PRN
  Administered 2020-06-01: 1 mg via INTRAVENOUS

## 2020-06-01 MED ORDER — POTASSIUM CHLORIDE CRYS ER 20 MEQ PO TBCR
40.0000 meq | EXTENDED_RELEASE_TABLET | Freq: Once | ORAL | Status: AC
  Administered 2020-06-01: 40 meq via ORAL
  Filled 2020-06-01: qty 2

## 2020-06-01 SURGICAL SUPPLY — 11 items
CATH 5FR JL3.5 JR4 ANG PIG MP (CATHETERS) ×1 IMPLANT
CATH SWAN GANZ 7F STRAIGHT (CATHETERS) ×1 IMPLANT
DEVICE RAD COMP TR BAND LRG (VASCULAR PRODUCTS) ×1 IMPLANT
GLIDESHEATH SLEND SS 6F .021 (SHEATH) ×1 IMPLANT
GLIDESHEATH SLENDER 7FR .021G (SHEATH) ×1 IMPLANT
GUIDEWIRE INQWIRE 1.5J.035X260 (WIRE) IMPLANT
INQWIRE 1.5J .035X260CM (WIRE) ×2
KIT HEART LEFT (KITS) ×2 IMPLANT
PACK CARDIAC CATHETERIZATION (CUSTOM PROCEDURE TRAY) ×2 IMPLANT
TRANSDUCER W/STOPCOCK (MISCELLANEOUS) ×2 IMPLANT
WIRE EMERALD 3MM-J .025X260CM (WIRE) ×1 IMPLANT

## 2020-06-01 NOTE — Progress Notes (Signed)
Peripherally Inserted Central Catheter Placement  The IV Nurse has discussed with the patient and/or persons authorized to consent for the patient, the purpose of this procedure and the potential benefits and risks involved with this procedure.  The benefits include less needle sticks, lab draws from the catheter, and the patient may be discharged home with the catheter. Risks include, but not limited to, infection, bleeding, blood clot (thrombus formation), and puncture of an artery; nerve damage and irregular heartbeat and possibility to perform a PICC exchange if needed/ordered by physician.  Alternatives to this procedure were also discussed.  Bard Power PICC patient education guide, fact sheet on infection prevention and patient information card has been provided to patient /or left at bedside.    PICC Placement Documentation  PICC Triple Lumen 06/01/20 PICC Right Brachial 45 cm 1 cm (Active)  Indication for Insertion or Continuance of Line Prolonged intravenous therapies 06/01/20 2140  Exposed Catheter (cm) 1 cm 06/01/20 2140  Site Assessment Clean;Dry;Intact 06/01/20 2140  Lumen #1 Status Capped (Central line);Flushed;Blood return noted 06/01/20 2140  Lumen #2 Status Capped (Central line);Flushed;Blood return noted 06/01/20 2140  Lumen #3 Status Capped (Central line);Flushed;Blood return noted 06/01/20 2140  Dressing Type Transparent;Occlusive 06/01/20 2140  Dressing Status Intact;Dry;Clean 06/01/20 2140  Antimicrobial disc in place? Yes 06/01/20 2140  Safety Lock Not Applicable 06/01/20 2140  Line Care Connections checked and tightened 06/01/20 2140  Line Adjustment (NICU/IV Team Only) No 06/01/20 2140  Dressing Intervention New dressing 06/01/20 2140  Dressing Change Due 06/08/20 06/01/20 2140       Burnard Bunting Chenice 06/01/2020, 10:20 PM

## 2020-06-01 NOTE — Progress Notes (Signed)
3 cc removed from TR band- pt r hand is neurovascularly intact- pt. Without c/o.

## 2020-06-01 NOTE — Interval H&P Note (Signed)
History and Physical Interval Note:  06/01/2020 10:25 AM  Kyle Pearson  has presented today for surgery, with the diagnosis of chest pain, heart failure.  The various methods of treatment have been discussed with the patient and family. After consideration of risks, benefits and other options for treatment, the patient has consented to  Procedure(s): RIGHT/LEFT HEART CATH AND CORONARY ANGIOGRAPHY (N/A) as a surgical intervention.  The patient's history has been reviewed, patient examined, no change in status, stable for surgery.  I have reviewed the patient's chart and labs.  Questions were answered to the patient's satisfaction.     Chiamaka Latka [employer]

## 2020-06-01 NOTE — Progress Notes (Signed)
PROGRESS NOTE  Kyle Pearson  DOB: [birth year]  PCP: Patient, No Pcp Per ZOX:[id number]  DOA: 05/29/2020  LOS: 3 days   Chief Complaint  Patient presents with  . Shortness of Breath  . Congestive Heart Failure   Brief narrative: Kyle Pearson is a 44 y.o. male with PMH significant for morbid obesity, chronic alcoholic, recently diagnosed with liver cirrhosis, severe systolic CHF, last EF 15 to 20%, admitted at [facility]. At discharge he was started on CHF meds including 40 mg Lasix daily. A new PCP was established and cardiology referral was given for outpatient follow-up. Seen by PCP or the first time on 11/29.  First cardiology appointment was scheduled for December 3.  On 05/29/2020, patient presented with progressively worsening shortness of breath, edema of both legs and abdomen and 30 pound weight gain in a month despite medication compliance.  In the ED, patient was afebrile, heart rate 105, oxygen saturation was 100% on room air. Labs showed BNP more than 1000. EKG with sinus tach at 114 bpm, QTC 457 ms. Chest x-ray showed cardiomegaly without acute lung findings.  Patient was admitted for decompensated systolic CHF.  Subjective: Patient was seen and examined this morning. Lying down in bed.   Not in distress.  Not in supplemental oxygen.    But he still remains grossly volume overloaded. Heart failure team consultation appreciated. Patient underwent cardiac cath later this morning.  No coronary disease.  Assessment/Plan: Acute exacerbation of severe systolic CHF  Nonischemic cardiomyopathy -Recently diagnosed with CHF, EF 15 to 20%. -Presented with worsening shortness of breath, pedal edema and weight gain despite medication compliance. -Heart failure team following.   -Currently on Lasix drip, milrinone drip, Aldactone, Farxiga.  Noted a plan to start Entresto.   -Unna boots for compression. -Noted a plan to do a cardiac MRI when  patient is more diuresed. -Repeat echo 11/30 showed EF low at less than 20%, global hypokinesis, severely dilated LV cavity and grade 3 diastolic dysfunction. -RHC analysis today did not show any coronary disease, showed moderately elevated filling pressures and low cardiac output. -Net negative balance of 1.9 L in 24 hours and 8.1 L since admission. -Continue to monitor daily weight, intake output, blood pressure, renal function and electrolytes.  Hypokalemia/hypomagnesemia -Replacement ordered.  Recheck tomorrow. Recent Labs  Lab 05/30/20 0359 05/30/20 2140 05/31/20 0047 06/01/20 0510 06/01/20 1047  K 3.6 3.4* 3.5 3.7 3.5  MG  --   --  1.6* 1.7  --   PHOS  --   --  4.5  --   --    History of chronic alcoholism. -Reportedly quit a month ago. -Continue monitor for CIWA protocol  Liver cirrhosis -Likely due to alcohol.  -AST only mildly elevated to 47, albumin 3.5.  Morbid obesity - Body mass index is 46.58 kg/m. Patient has been advised to make an attempt to improve diet and exercise patterns to aid in weight loss.  Obstructive sleep apnea -Reports snoring, frequent awakenings at night and not feeling fresh during the day -Recommend sleep study as an outpatient.  Mobility: Encourage ambulation Code Status:   Code Status: Full Code  Nutritional status: Body mass index is 46.58 kg/m.     Diet Order            Diet 2 gram sodium Room service appropriate? Yes; Fluid consistency: Thin  Diet effective now                 DVT  prophylaxis: enoxaparin (LOVENOX) injection 40 mg Start: 06/02/20 0800 SCD's Start: 06/01/20 1529 enoxaparin (LOVENOX) injection 40 mg Start: 05/30/20 1000   Antimicrobials:  None Fluid: None Consultants: None Family Communication:  None at bedside  Status is: Inpatient  Remains inpatient appropriate because: Ongoing IV diuresis, cardiology evaluation   Dispo: The patient is from: Home              Anticipated d/c is to: Home               Anticipated d/c date is: 2 to 3 days              Patient currently is not medically stable to d/c.   Infusions:  . sodium chloride    . sodium chloride    . furosemide (LASIX) 200 mg in dextrose 5% 100 mL (2mg /mL) infusion 12 mg/hr (06/01/20 1125)  . magnesium sulfate bolus IVPB    . milrinone 0.25 mcg/kg/min (06/01/20 1126)    Scheduled Meds: . dapagliflozin propanediol  10 mg Oral Daily  . digoxin  0.125 mg Oral Daily  . enoxaparin (LOVENOX) injection  40 mg Subcutaneous Q24H  . [START ON 06/02/2020] enoxaparin (LOVENOX) injection  40 mg Subcutaneous Q24H  . folic acid  1 mg Oral Daily  . losartan  25 mg Oral Daily  . metolazone  2.5 mg Oral Once  . metoprolol succinate  25 mg Oral Daily  . multivitamin with minerals  1 tablet Oral Daily  . potassium chloride SA  40 mEq Oral Daily  . potassium chloride  40 mEq Oral Once  . sodium chloride flush  3 mL Intravenous Q12H  . sodium chloride flush  3 mL Intravenous Q12H  . sodium chloride flush  3 mL Intravenous Q12H  . spironolactone  25 mg Oral Daily  . thiamine  100 mg Oral Daily    Antimicrobials: Anti-infectives (From admission, onward)   None      PRN meds: sodium chloride, sodium chloride, acetaminophen, acetaminophen, bisacodyl, guaiFENesin, hydrALAZINE, labetalol, ondansetron (ZOFRAN) IV, ondansetron (ZOFRAN) IV, sodium chloride flush, sodium chloride flush   Objective: Vitals:   06/01/20 1445 06/01/20 1450  BP: 128/73 128/73  Pulse: 96 (!) 104  Resp: 18 (!) 24  Temp:    SpO2: (!) 88% 95%    Intake/Output Summary (Last 24 hours) at 06/01/2020 1538 Last data filed at 06/01/2020 1500 Gross per 24 hour  Intake 480 ml  Output 3125 ml  Net -2645 ml   Filed Weights   05/31/20 0002 05/31/20 0500 06/01/20 0136  Weight: 130 kg 130 kg 130.9 kg   Weight change: 0.854 kg Body mass index is 46.58 kg/m.   Physical Exam: General exam: Pleasant.  Morbidly obese.  Not on supplemental oxygen but orthopneic. Skin:  No rashes, lesions or ulcers. HEENT: Atraumatic, normocephalic, no obvious bleeding Lungs: Clear to auscultation bilaterally CVS: Slightly tachycardic, no murmur GI/Abd soft, nontender, distended from obesity, bowel sound present CNS: Alert, awake, oriented x3 Psychiatry: Mood appropriate Extremities: Pedal edema 2+ bilaterally, no calf tenderness  Data Review: I have personally reviewed the laboratory data and studies available.  Recent Labs  Lab 05/29/20 1255 05/30/20 0359 06/01/20 1047  WBC 9.3 9.7  --   HGB 13.0 13.3 14.3  HCT 40.4 41.5 42.0  MCV 88.8 89.4  --   PLT 216 220  --    Recent Labs  Lab 05/29/20 1255 05/29/20 1255 05/30/20 0359 05/30/20 2140 05/31/20 0047 06/01/20 0510 06/01/20 1047  NA 136  --  139  --  136 133* 137  K 4.0   < > 3.6 3.4* 3.5 3.7 3.5  CL 104  --  99  --  95* 92*  --   CO2 25  --  27  --  29 27  --   GLUCOSE 122*  --  102*  --  115* 107*  --   BUN 10  --  11  --  11 13  --   CREATININE 1.02  --  1.03  --  1.07 1.06  --   CALCIUM 8.6*  --  9.1  --  9.1 8.9  --   MG  --   --   --   --  1.6* 1.7  --   PHOS  --   --   --   --  4.5  --   --    < > = values in this interval not displayed.    F/u labs ordered  Signed, Lorin Glass, MD [facility] 06/01/2020

## 2020-06-01 NOTE — Progress Notes (Signed)
3 cc removed from TR band; r hand neurovascular status intact to r hand- pt. Offers no c/o Iv sites clear ( r hand and l hand with iv infusing without difficulty on alaris pump.

## 2020-06-01 NOTE — Progress Notes (Addendum)
Patient ID: Kyle Pearson, male   DOB: [birth year], 44 y.o.   MRN: [id number]     Advanced Heart Failure Rounding Note  PCP-Cardiologist: No primary care provider on file.   Subjective:    Patient diuresed some overnight but is still short of breath and orthopneic.  Significant abdominal distention. Creatinine stable.   LHC/RHC:  Coronary Findings  Diagnostic Dominance: Right Left Main  No significant coronary disease.  Left Anterior Descending  No significant coronary disease.  Ramus Intermedius  No significant coronary disease.  Left Circumflex  Relatively small vessel in setting of large RCA. No significant coronary disease.  Right Coronary Artery  No significant coronary disease.  Intervention  No interventions have been documented. Right Heart  Right Heart Pressures RHC Procedural Findings: Hemodynamics (mmHg) RA mean 29 RV 66/32 PA 69/32, mean 49 PCWP mean 43 LV 111/47 AO 111/89  Oxygen saturations: PA 63% AO 98%  Cardiac Output (Fick) 4.91  Cardiac Index (Fick) 2.1 PVR 1.22   Cardiac Output (Thermo) 3.96  Cardiac Index (Thermo) 1.7     Objective:   Weight Range: 130.9 kg Body mass index is 46.58 kg/m.   Vital Signs:   Temp:  [97.3 F (36.3 C)-98.7 F (37.1 C)] 97.6 F (36.4 C) (12/02 0414) Pulse Rate:  [91-101] 91 (12/02 0414) Resp:  [18-20] 18 (12/02 0414) BP: (104-129)/(77-102) 123/102 (12/02 0414) SpO2:  [94 %-100 %] 100 % (12/02 1016) Weight:  [130.9 kg] 130.9 kg (12/02 0136) Last BM Date: 05/30/20  Weight change: Filed Weights   05/31/20 0002 05/31/20 0500 06/01/20 0136  Weight: 130 kg 130 kg 130.9 kg    Intake/Output:   Intake/Output Summary (Last 24 hours) at 06/01/2020 1121 Last data filed at 06/01/2020 0136 Gross per 24 hour  Intake 360 ml  Output 1625 ml  Net -1265 ml      Physical Exam    General:  Well appearing. No resp difficulty HEENT: Normal Neck: Supple. JVP 16+. Carotids 2+ bilat; no bruits. No  lymphadenopathy or thyromegaly appreciated. Cor: PMI lateral. Regular rate & rhythm. No rubs, gallops or murmurs. Lungs: Clear Abdomen: Soft, nontender, moderately distended. No hepatosplenomegaly. No bruits or masses. Good bowel sounds. Extremities: No cyanosis, clubbing, rash. 2+ edema to knees.  Neuro: Alert & orientedx3, cranial nerves grossly intact. moves all 4 extremities w/o difficulty. Affect pleasant   Telemetry   NSR 100s (personally reviewed)  Labs    CBC Recent Labs    05/29/20 1255 05/30/20 0359  WBC 9.3 9.7  HGB 13.0 13.3  HCT 40.4 41.5  MCV 88.8 89.4  PLT 216 220   Basic Metabolic Panel Recent Labs    85/63/14 0047 06/01/20 0510  NA 136 133*  K 3.5 3.7  CL 95* 92*  CO2 29 27  GLUCOSE 115* 107*  BUN 11 13  CREATININE 1.07 1.06  CALCIUM 9.1 8.9  MG 1.6* 1.7  PHOS 4.5  --    Liver Function Tests Recent Labs    05/30/20 0359  AST 47*  ALT 27  ALKPHOS 60  BILITOT 2.1*  PROT 7.3  ALBUMIN 3.5   No results for input(s): LIPASE, AMYLASE in the last 72 hours. Cardiac Enzymes No results for input(s): CKTOTAL, CKMB, CKMBINDEX, TROPONINI in the last 72 hours.  BNP: BNP (last 3 results) Recent Labs    05/29/20 1255  BNP 1,020.9*    ProBNP (last 3 results) No results for input(s): PROBNP in the last 8760 hours.   D-Dimer No results for  input(s): DDIMER in the last 72 hours. Hemoglobin A1C No results for input(s): HGBA1C in the last 72 hours. Fasting Lipid Panel No results for input(s): CHOL, HDL, LDLCALC, TRIG, CHOLHDL, LDLDIRECT in the last 72 hours. Thyroid Function Tests No results for input(s): TSH, T4TOTAL, T3FREE, THYROIDAB in the last 72 hours.  Invalid input(s): FREET3  Other results:   Imaging    CARDIAC CATHETERIZATION  Result Date: 06/01/2020 1. Markedly elevated right and left heart filling pressures. 2. Pulmonary venous hypertension. 3. Low cardiac output. 4. No significant coronary disease (minimal contrast given).  Nonischemic cardiomyopathy.   [place] EKG SITE RITE  Result Date: 06/01/2020 If Site Rite image not attached, placement could not be confirmed due to current cardiac rhythm.     Medications:     Scheduled Medications: . [START ON 06/02/2020] atorvastatin  80 mg Oral Q0600  . dapagliflozin propanediol  10 mg Oral Daily  . [MAR Hold] enoxaparin (LOVENOX) injection  40 mg Subcutaneous Q24H  . [MAR Hold] folic acid  1 mg Oral Daily  . [MAR Hold] losartan  25 mg Oral Daily  . [MAR Hold] metoprolol succinate  25 mg Oral Daily  . [MAR Hold] multivitamin with minerals  1 tablet Oral Daily  . [MAR Hold] potassium chloride SA  40 mEq Oral Daily  . [MAR Hold] sodium chloride flush  3 mL Intravenous Q12H  . [MAR Hold] sodium chloride flush  3 mL Intravenous Q12H  . spironolactone  25 mg Oral Daily  . [MAR Hold] thiamine  100 mg Oral Daily     Infusions: . [MAR Hold] sodium chloride    . sodium chloride    . sodium chloride 10 mL/hr at 06/01/20 0507  . furosemide (LASIX) 200 mg in dextrose 5% 100 mL (2mg /mL) infusion    . [MAR Hold] magnesium sulfate bolus IVPB    . milrinone       PRN Medications:  [MAR Hold] sodium chloride, sodium chloride, [MAR Hold] acetaminophen, [MAR Hold] bisacodyl, fentaNYL, furosemide, [MAR Hold] guaiFENesin, Heparin (Porcine) in NaCl, heparin sodium (porcine), iohexol, lidocaine (PF), midazolam, [MAR Hold] ondansetron (ZOFRAN) IV, Radial Cocktail/Verapamil only, [MAR Hold] sodium chloride flush, sodium chloride flush   Assessment/Plan   1. Acute on chronic systolic CHF: Symptomatic since 9/21 (after episode of peri-tonsillar abscess).  He was a prior drinker (none since 9/21) but does not seem to describe heavy enough ETOH to cause profound cardiomyopathy (though he did have mild cirrhosis on abdominal 10/21 at [facility]).  No FH of cardiomyopathy.  Born in [facility], has been in [place] x many years.  T cruzi testing sent at [facility].  HIV negative 9/21.  Echo this admission  with EF < 20% with severe LV dilation, moderate RVE with severely decreased systolic function, severe biatrial enlargement, mild MR. RHC/LHC today showed no coronary disease, markedly elevated filling pressures, and low cardiac output.  He remains short of breath and orthopneic.  - Lasix 80 mg IV given in cath lab, start 12 mg/hr.  Will also give metolazone 2.5 mg x 1.  Replace K.  - Place PICC to follow co-ox and CVP.  - Start milrinone 0.25 mcg/kg/min.  - Start digoxin 0.125 daily.  - Continue losartan today, transition to [facility] tomorrow if BP stable.  - Increase spironolactone to 25 mg daily.  - Dapagliflozin 10 mg daily.   - Unna boots - Send TSH.  - Cardiac MRI when more diuresed and can lie flat.  2. ETOH: No longer drinks.  Does  not seem to have been heavy enough drinker to cause CMP, but he did have early cirrhosis on abdominal US at [facility].  However, with obesity could be NASH.  3. Suspect OSA.   Think he may be best in ICU setting with very markedly elevated PCWP.   Length of Stay: 3  Marca Ancona, MD  06/01/2020, 11:21 AM  Advanced Heart Failure Team Pager [phone] (M-F; 7a - 4p)  Please contact [facility] for night-coverage after hours (4p -7a ) and weekends on [email or web address]

## 2020-06-01 NOTE — Progress Notes (Signed)
3 cc removed (5 cc remain), r TR band and r hand neurovascular stus  Is intact - pt offers no c/o

## 2020-06-02 ENCOUNTER — Encounter (HOSPITAL_COMMUNITY): Payer: Self-pay | Admitting: Cardiology

## 2020-06-02 DIAGNOSIS — I5023 Acute on chronic systolic (congestive) heart failure: Secondary | ICD-10-CM | POA: Diagnosis not present

## 2020-06-02 LAB — BASIC METABOLIC PANEL
Anion gap: 12 (ref 5–15)
BUN: 14 mg/dL (ref 6–20)
CO2: 35 mmol/L — ABNORMAL HIGH (ref 22–32)
Calcium: 9.1 mg/dL (ref 8.9–10.3)
Chloride: 87 mmol/L — ABNORMAL LOW (ref 98–111)
Creatinine, Ser: 1.09 mg/dL (ref 0.61–1.24)
GFR, Estimated: >60 mL/min (ref >60)
Glucose, Bld: 136 mg/dL — ABNORMAL HIGH (ref 70–99)
Potassium: 3 mmol/L — ABNORMAL LOW (ref 3.5–5.1)
Sodium: 134 mmol/L — ABNORMAL LOW (ref 135–145)

## 2020-06-02 LAB — COOXEMETRY PANEL
Carboxyhemoglobin: 1.3 % (ref 0.5–1.5)
Carboxyhemoglobin: 1.3 % (ref 0.5–1.5)
Methemoglobin: 0.7 % (ref 0.0–1.5)
Methemoglobin: 0.8 % (ref 0.0–1.5)
O2 Saturation: 72.6 %
O2 Saturation: 82.2 %
Total hemoglobin: 12.8 g/dL (ref 12.0–16.0)
Total hemoglobin: 13.1 g/dL (ref 12.0–16.0)

## 2020-06-02 LAB — MRSA PCR SCREENING: MRSA by PCR: NEGATIVE

## 2020-06-02 LAB — POTASSIUM: Potassium: 3.6 mmol/L (ref 3.5–5.1)

## 2020-06-02 LAB — MAGNESIUM: Magnesium: 1.6 mg/dL — ABNORMAL LOW (ref 1.7–2.4)

## 2020-06-02 MED ORDER — POTASSIUM CHLORIDE CRYS ER 20 MEQ PO TBCR
60.0000 meq | EXTENDED_RELEASE_TABLET | ORAL | Status: AC
  Administered 2020-06-02: 60 meq via ORAL
  Filled 2020-06-02: qty 3

## 2020-06-02 MED ORDER — ENOXAPARIN SODIUM 60 MG/0.6ML ~~LOC~~ SOLN
60.0000 mg | SUBCUTANEOUS | Status: DC
  Administered 2020-06-03 – 2020-06-06 (×4): 60 mg via SUBCUTANEOUS
  Filled 2020-06-02 (×4): qty 0.6

## 2020-06-02 MED ORDER — POTASSIUM CHLORIDE CRYS ER 20 MEQ PO TBCR
40.0000 meq | EXTENDED_RELEASE_TABLET | Freq: Once | ORAL | Status: AC
  Administered 2020-06-02: 40 meq via ORAL
  Filled 2020-06-02: qty 2

## 2020-06-02 MED ORDER — SACUBITRIL-VALSARTAN 24-26 MG PO TABS
1.0000 | ORAL_TABLET | Freq: Two times a day (BID) | ORAL | Status: DC
  Administered 2020-06-02 – 2020-06-06 (×9): 1 via ORAL
  Filled 2020-06-02 (×10): qty 1

## 2020-06-02 MED ORDER — MAGNESIUM SULFATE 4 GM/100ML IV SOLN
4.0000 g | Freq: Once | INTRAVENOUS | Status: AC
  Administered 2020-06-02: 4 g via INTRAVENOUS
  Filled 2020-06-02: qty 100

## 2020-06-02 NOTE — Progress Notes (Signed)
Pt transferred to 2C02 via wheelchair in stable condition.  All personal belongings accounted for.

## 2020-06-02 NOTE — Plan of Care (Signed)

## 2020-06-02 NOTE — Progress Notes (Signed)
Patient ID: Kyle Pearson, male   DOB: 19-Oct-1975, 44 y.o.   MRN: [id number]     Advanced Heart Failure Rounding Note  PCP-Cardiologist: No primary care provider on file.   Subjective:    Great diuresis overnight on milrinone 0.25 and Lasix gtt 12 mg/hr + metolazone.  Co-ox 82% this morning, CVP 15.   Breathing better. Still with orthopnea.   LHC/RHC:  Coronary Findings  Diagnostic Dominance: Right Left Main  No significant coronary disease.  Left Anterior Descending  No significant coronary disease.  Ramus Intermedius  No significant coronary disease.  Left Circumflex  Relatively small vessel in setting of large RCA. No significant coronary disease.  Right Coronary Artery  No significant coronary disease.  Intervention  No interventions have been documented. Right Heart  Right Heart Pressures RHC Procedural Findings: Hemodynamics (mmHg) RA mean 29 RV 66/32 PA 69/32, mean 49 PCWP mean 43 LV 111/47 AO 111/89  Oxygen saturations: PA 63% AO 98%  Cardiac Output (Fick) 4.91  Cardiac Index (Fick) 2.1 PVR 1.22   Cardiac Output (Thermo) 3.96  Cardiac Index (Thermo) 1.7     Objective:   Weight Range: 124.8 kg Body mass index is 44.41 kg/m.   Vital Signs:   Temp:  [97.8 F (36.6 C)-98.2 F (36.8 C)] 98.1 F (36.7 C) (12/03 0400) Pulse Rate:  [85-146] 95 (12/03 0700) Resp:  [8-25] 16 (12/03 0700) BP: (86-163)/(58-126) 103/77 (12/03 0700) SpO2:  [88 %-100 %] 91 % (12/03 0700) Weight:  [124.8 kg] 124.8 kg (12/03 0627) Last BM Date: 06/01/20  Weight change: Filed Weights   05/31/20 0500 06/01/20 0136 06/02/20 0627  Weight: 130 kg 130.9 kg 124.8 kg    Intake/Output:   Intake/Output Summary (Last 24 hours) at 06/02/2020 0735 Last data filed at 06/02/2020 0700 Gross per 24 hour  Intake 1311.64 ml  Output 9276 ml  Net -7964.36 ml      Physical Exam    General: NAD Neck: JVP 14 cm, no thyromegaly or thyroid nodule.  Lungs: Clear to  auscultation bilaterally with normal respiratory effort. CV: Nondisplaced PMI.  Heart regular S1/S2, no S3/S4, no murmur.  1+ edema 3/4 to knees.  Abdomen: Soft, nontender, no hepatosplenomegaly, no distention.  Skin: Intact without lesions or rashes.  Neurologic: Alert and oriented x 3.  Psych: Normal affect. Extremities: No clubbing or cyanosis.  HEENT: Normal.    Telemetry   NSR 100s (personally reviewed)  Labs    CBC Recent Labs    06/01/20 1047 06/01/20 1720  WBC  --  7.8  HGB 14.3 13.2  HCT 42.0 40.1  MCV  --  86.6  PLT  --  216   Basic Metabolic Panel Recent Labs    62/95/28 0047 05/31/20 0047 06/01/20 0510 06/01/20 1046 06/01/20 1720 06/01/20 1720 06/02/20 0016 06/02/20 0427  NA 136   < > 133*   < > 135  --   --  134*  K 3.5   < > 3.7   < > 3.2*   < > 3.6 3.0*  CL 95*   < > 92*  --  91*  --   --  87*  CO2 29   < > 27  --  33*  --   --  35*  GLUCOSE 115*   < > 107*  --  112*  --   --  136*  BUN 11   < > 13  --  11  --   --  14  CREATININE 1.07   < > 1.06  --  1.08  --   --  1.09  CALCIUM 9.1   < > 8.9  --  9.2  --   --  9.1  MG 1.6*  --  1.7  --   --   --   --   --   PHOS 4.5  --   --   --   --   --   --   --    < > = values in this interval not displayed.   Liver Function Tests No results for input(s): AST, ALT, ALKPHOS, BILITOT, PROT, ALBUMIN in the last 72 hours. No results for input(s): LIPASE, AMYLASE in the last 72 hours. Cardiac Enzymes No results for input(s): CKTOTAL, CKMB, CKMBINDEX, TROPONINI in the last 72 hours.  BNP: BNP (last 3 results) Recent Labs    05/29/20 1255  BNP 1,020.9*    ProBNP (last 3 results) No results for input(s): PROBNP in the last 8760 hours.   D-Dimer No results for input(s): DDIMER in the last 72 hours. Hemoglobin A1C No results for input(s): HGBA1C in the last 72 hours. Fasting Lipid Panel No results for input(s): CHOL, HDL, LDLCALC, TRIG, CHOLHDL, LDLDIRECT in the last 72 hours. Thyroid Function  Tests Recent Labs    06/01/20 1720  TSH 2.095    Other results:   Imaging    CARDIAC CATHETERIZATION  Result Date: 06/01/2020 1. Markedly elevated right and left heart filling pressures. 2. Pulmonary venous hypertension. 3. Low cardiac output. 4. No significant coronary disease (minimal contrast given). Nonischemic cardiomyopathy.   [place] EKG SITE RITE  Result Date: 06/01/2020 If Site Rite image not attached, placement could not be confirmed due to current cardiac rhythm.    Medications:     Scheduled Medications: . Chlorhexidine Gluconate Cloth  6 each Topical Daily  . dapagliflozin propanediol  10 mg Oral Daily  . digoxin  0.125 mg Oral Daily  . enoxaparin (LOVENOX) injection  40 mg Subcutaneous Q24H  . folic acid  1 mg Oral Daily  . losartan  25 mg Oral Daily  . metoprolol succinate  25 mg Oral Daily  . multivitamin with minerals  1 tablet Oral Daily  . potassium chloride SA  40 mEq Oral Daily  . sodium chloride flush  10-40 mL Intracatheter Q12H  . sodium chloride flush  3 mL Intravenous Q12H  . sodium chloride flush  3 mL Intravenous Q12H  . sodium chloride flush  3 mL Intravenous Q12H  . spironolactone  25 mg Oral Daily  . thiamine  100 mg Oral Daily    Infusions: . sodium chloride    . sodium chloride    . furosemide (LASIX) 200 mg in dextrose 5% 100 mL (2mg /mL) infusion 12 mg/hr (06/02/20 0700)  . magnesium sulfate bolus IVPB    . milrinone 0.25 mcg/kg/min (06/02/20 0700)    PRN Medications: sodium chloride, sodium chloride, acetaminophen, bisacodyl, guaiFENesin, ondansetron (ZOFRAN) IV, sodium chloride flush, sodium chloride flush, sodium chloride flush   Assessment/Plan   1. Acute on chronic systolic CHF: Nonischemic cardiomyopathy.  Symptomatic since 9/21 (after episode of peri-tonsillar abscess).  He was a prior drinker (none since 9/21) but does not seem to describe heavy enough ETOH to cause profound cardiomyopathy (though he did have mild  cirrhosis on abdominal 10/21 at [facility]).  No FH of cardiomyopathy.  Born in [facility], has been in [place] x many years.  T cruzi testing sent at [facility].  HIV negative 9/21.  Echo this admission with EF < 20% with severe LV dilation, moderate RVE with severely decreased systolic function, severe biatrial enlargement, mild MR. RHC/LHC 12/2 showed no coronary disease, markedly elevated filling pressures, and low cardiac output.  Milrinone 0.25 + Lasix gtt 12 mg/hr started.  Excellent diuresis yesterday, weight down. CVP remains 15.  Symptomatically improving, still orthopneic.  Co-ox 82%.  - Continue Lasix 12 mg/hr and replace K.  - Continue milrinone 0.25 while aggressively diuresing, hope to stop over weekend.  - Stop losartan and start Entresto 24/26 bid.   - Continue digoxin 0.125 daily.  - Continue Toprol XL 25 daily.   - Continue spironolactone 25 mg daily.  - Dapagliflozin 10 mg daily.   - Unna boots - Cardiac MRI when more diuresed and can lie flat => probably Monday.  2. ETOH: No longer drinks.  Does not seem to have been heavy enough drinker to cause CMP, but he did have early cirrhosis on abdominal US at [facility].  However, with obesity could be NASH.  3. Suspect OSA.   Can go to step down.   Length of Stay: 4  Marca Ancona, MD  06/02/2020, 7:35 AM  Advanced Heart Failure Team Pager [phone] (M-F; 7a - 4p)  Please contact [facility] for night-coverage after hours (4p -7a ) and weekends on [email or web address]

## 2020-06-02 NOTE — TOC Progression Note (Signed)
Transition of Care [facility]) - Progression Note    Patient Details  Name: Kyle Pearson MRN: [id number] Date of Birth: [birth year]  Transition of Care [facility]) CM/SW Contact  Leone Haven, RN Phone Number: 06/02/2020, 6:01 PM  Clinical Narrative:    CHF ex, on milrinone with picc  , cvp, unaboots. TOC team will cont to follow.         Expected Discharge Plan and Services                                                 Social Determinants of Health (SDOH) Interventions    Readmission Risk Interventions No flowsheet data found.

## 2020-06-03 DIAGNOSIS — I5023 Acute on chronic systolic (congestive) heart failure: Secondary | ICD-10-CM | POA: Diagnosis not present

## 2020-06-03 LAB — COOXEMETRY PANEL
Carboxyhemoglobin: 1.2 % (ref 0.5–1.5)
Methemoglobin: 0.8 % (ref 0.0–1.5)
O2 Saturation: 72.4 %
Total hemoglobin: 14.7 g/dL (ref 12.0–16.0)

## 2020-06-03 LAB — BASIC METABOLIC PANEL
Anion gap: 13 (ref 5–15)
BUN: 12 mg/dL (ref 6–20)
CO2: 37 mmol/L — ABNORMAL HIGH (ref 22–32)
Calcium: 9.9 mg/dL (ref 8.9–10.3)
Chloride: 81 mmol/L — ABNORMAL LOW (ref 98–111)
Creatinine, Ser: 1.08 mg/dL (ref 0.61–1.24)
GFR, Estimated: >60 mL/min (ref >60)
Glucose, Bld: 127 mg/dL — ABNORMAL HIGH (ref 70–99)
Potassium: 3.3 mmol/L — ABNORMAL LOW (ref 3.5–5.1)
Sodium: 131 mmol/L — ABNORMAL LOW (ref 135–145)

## 2020-06-03 LAB — MAGNESIUM: Magnesium: 1.9 mg/dL (ref 1.7–2.4)

## 2020-06-03 MED ORDER — FUROSEMIDE 40 MG PO TABS
40.0000 mg | ORAL_TABLET | Freq: Every day | ORAL | Status: DC
  Administered 2020-06-04 – 2020-06-06 (×3): 40 mg via ORAL
  Filled 2020-06-03 (×3): qty 1

## 2020-06-03 MED ORDER — POTASSIUM CHLORIDE CRYS ER 20 MEQ PO TBCR
40.0000 meq | EXTENDED_RELEASE_TABLET | Freq: Once | ORAL | Status: AC
  Administered 2020-06-03: 40 meq via ORAL
  Filled 2020-06-03: qty 2

## 2020-06-03 NOTE — Progress Notes (Signed)
Patient ID: Kyle Pearson, male   DOB: Apr 04, 1976, 44 y.o.   MRN: [id number]     Advanced Heart Failure Rounding Note  PCP-Cardiologist: No primary care provider on file.   Subjective:    Diuresed almost 10L overnight on 0.25 and Lasix gtt 12 mg/hr + metolazone.  Co-ox 72% this morning, CVP 6-7 Weight down 16 pounds overnight. 32 pounds total  Creatinine stable.   Breathing better. Denies orthopnea or PND. Having severe cramps.   LHC/RHC:  Coronary Findings  Diagnostic Dominance: Right Left Main  No significant coronary disease.  Left Anterior Descending  No significant coronary disease.  Ramus Intermedius  No significant coronary disease.  Left Circumflex  Relatively small vessel in setting of large RCA. No significant coronary disease.  Right Coronary Artery  No significant coronary disease.  Intervention  No interventions have been documented. Right Heart  Right Heart Pressures RHC Procedural Findings: Hemodynamics (mmHg) RA mean 29 RV 66/32 PA 69/32, mean 49 PCWP mean 43 LV 111/47 AO 111/89  Oxygen saturations: PA 63% AO 98%  Cardiac Output (Fick) 4.91  Cardiac Index (Fick) 2.1 PVR 1.22   Cardiac Output (Thermo) 3.96  Cardiac Index (Thermo) 1.7     Objective:   Weight Range: 117.9 kg Body mass index is 41.95 kg/m.   Vital Signs:   Temp:  [97.8 F (36.6 C)-99 F (37.2 C)] 98.2 F (36.8 C) (12/04 1106) Pulse Rate:  [93-108] 94 (12/04 1106) Resp:  [13-20] 17 (12/04 1106) BP: (99-111)/(53-83) 100/53 (12/04 1106) SpO2:  [91 %-94 %] 93 % (12/04 1106) Weight:  [117.9 kg] 117.9 kg (12/04 0430) Last BM Date: 06/02/20 (per patient RN did not witness)  Weight change: Filed Weights   06/01/20 0136 06/02/20 0627 06/03/20 0430  Weight: 130.9 kg 124.8 kg 117.9 kg    Intake/Output:   Intake/Output Summary (Last 24 hours) at 06/03/2020 1337 Last data filed at 06/03/2020 1107 Gross per 24 hour  Intake 955.57 ml  Output 91638 ml  Net -9069.43  ml      Physical Exam    General:  Well appearing. No resp difficulty HEENT: normal Neck: supple. JVP 6-7. Carotids 2+ bilat; no bruits. No lymphadenopathy or thryomegaly appreciated. Cor: PMI nondisplaced. Regular rate & rhythm. No rubs, gallops or murmurs. Lungs: clear Abdomen: obese soft, nontender, nondistended. No hepatosplenomegaly. No bruits or masses. Good bowel sounds. Extremities: no cyanosis, clubbing, rash, edema Neuro: alert & orientedx3, cranial nerves grossly intact. moves all 4 extremities w/o difficulty. Affect pleasant    Telemetry   NSR 90s Personally reviewed  Labs    CBC Recent Labs    06/01/20 1047 06/01/20 1720  WBC  --  7.8  HGB 14.3 13.2  HCT 42.0 40.1  MCV  --  86.6  PLT  --  216   Basic Metabolic Panel Recent Labs    46/65/99 0510 06/02/20 0427 06/02/20 0825 06/03/20 0330  NA  --  134*  --  131*  K  --  3.0*  --  3.3*  CL  --  87*  --  81*  CO2  --  35*  --  37*  GLUCOSE  --  136*  --  127*  BUN  --  14  --  12  CREATININE  --  1.09  --  1.08  CALCIUM  --  9.1  --  9.9  MG   < >  --  1.6* 1.9   < > = values in this interval not displayed.  Liver Function Tests No results for input(s): AST, ALT, ALKPHOS, BILITOT, PROT, ALBUMIN in the last 72 hours. No results for input(s): LIPASE, AMYLASE in the last 72 hours. Cardiac Enzymes No results for input(s): CKTOTAL, CKMB, CKMBINDEX, TROPONINI in the last 72 hours.  BNP: BNP (last 3 results) Recent Labs    05/29/20 1255  BNP 1,020.9*    ProBNP (last 3 results) No results for input(s): PROBNP in the last 8760 hours.   D-Dimer No results for input(s): DDIMER in the last 72 hours. Hemoglobin A1C No results for input(s): HGBA1C in the last 72 hours. Fasting Lipid Panel No results for input(s): CHOL, HDL, LDLCALC, TRIG, CHOLHDL, LDLDIRECT in the last 72 hours. Thyroid Function Tests Recent Labs    06/01/20 1720  TSH 2.095    Other results:   Imaging    No results  found.   Medications:     Scheduled Medications: . Chlorhexidine Gluconate Cloth  6 each Topical Daily  . dapagliflozin propanediol  10 mg Oral Daily  . digoxin  0.125 mg Oral Daily  . enoxaparin (LOVENOX) injection  60 mg Subcutaneous Q24H  . folic acid  1 mg Oral Daily  . metoprolol succinate  25 mg Oral Daily  . multivitamin with minerals  1 tablet Oral Daily  . potassium chloride SA  40 mEq Oral Daily  . sacubitril-valsartan  1 tablet Oral BID  . sodium chloride flush  10-40 mL Intracatheter Q12H  . spironolactone  25 mg Oral Daily  . thiamine  100 mg Oral Daily    Infusions: . sodium chloride    . furosemide (LASIX) 200 mg in dextrose 5% 100 mL (2mg /mL) infusion 12 mg/hr (06/03/20 0400)  . milrinone 0.25 mcg/kg/min (06/03/20 0939)    PRN Medications: sodium chloride, acetaminophen, bisacodyl, guaiFENesin, ondansetron (ZOFRAN) IV, sodium chloride flush   Assessment/Plan   1. Acute on chronic systolic CHF: Nonischemic cardiomyopathy.  Symptomatic since 9/21 (after episode of peri-tonsillar abscess).  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  No FH of cardiomyopathy.  Born in [facility], has been in [place] x many years.  T cruzi testing sent at [facility].  HIV negative 9/21.  Echo this admission with EF < 20% with severe LV dilation, moderate RVE with severely decreased systolic function, severe biatrial enlargement, mild MR. RHC/LHC 12/2 showed no coronary disease, markedly elevated filling pressures, and low cardiac output.  Milrinone 0.25 + Lasix gtt 12 mg/hr started.  Diuresed almost 10L overnight on 0.25 and Lasix gtt 12 mg/hr + metolazone.  Co-ox 72% this morning, CVP86-7. Weight down 16 pounds overnight. 32 pounds total  Creatinine stable.  - Will stop lasix gtt. Switch to po. Supp K - Drop milrinone to 0.125 - Continue Entresto 24/26 bid.    - Continue digoxin 0.125 daily.  - Continue Toprol XL 25 daily.   - Continue spironolactone 25 mg daily.  - Dapagliflozin 10 mg daily.   - Unna boots - Cardiac MRI on Monday  2. ETOH: No longer drinks.  Does not seem to have been heavy enough drinker to cause CMP, but he did have early cirrhosis on abdominal Thursday at [facility].  However, with obesity could be NASH.  3. Suspect OSA.  4. Hypokalemia - supp    Length of Stay: 5  [facility], MD  06/03/2020, 1:37 PM  Advanced Heart Failure  Team Pager [phone] (M-F; [place])  Please contact [facility] for night-coverage after hours (4p -7a ) and weekends on [email or web address]

## 2020-06-03 NOTE — Plan of Care (Signed)

## 2020-06-04 DIAGNOSIS — I5023 Acute on chronic systolic (congestive) heart failure: Secondary | ICD-10-CM | POA: Diagnosis not present

## 2020-06-04 LAB — BASIC METABOLIC PANEL
Anion gap: 12 (ref 5–15)
Anion gap: 11 (ref 5–15)
BUN: 13 mg/dL (ref 6–20)
BUN: 13 mg/dL (ref 6–20)
CO2: 35 mmol/L — ABNORMAL HIGH (ref 22–32)
CO2: 32 mmol/L (ref 22–32)
Calcium: 9.3 mg/dL (ref 8.9–10.3)
Calcium: 9 mg/dL (ref 8.9–10.3)
Chloride: 82 mmol/L — ABNORMAL LOW (ref 98–111)
Chloride: 85 mmol/L — ABNORMAL LOW (ref 98–111)
Creatinine, Ser: 1.06 mg/dL (ref 0.61–1.24)
Creatinine, Ser: 1.07 mg/dL (ref 0.61–1.24)
GFR, Estimated: >60 mL/min (ref >60)
GFR, Estimated: >60 mL/min (ref >60)
Glucose, Bld: 162 mg/dL — ABNORMAL HIGH (ref 70–99)
Glucose, Bld: 148 mg/dL — ABNORMAL HIGH (ref 70–99)
Potassium: 3.3 mmol/L — ABNORMAL LOW (ref 3.5–5.1)
Potassium: 3.8 mmol/L (ref 3.5–5.1)
Sodium: 129 mmol/L — ABNORMAL LOW (ref 135–145)
Sodium: 128 mmol/L — ABNORMAL LOW (ref 135–145)

## 2020-06-04 LAB — COOXEMETRY PANEL
Carboxyhemoglobin: 1.2 % (ref 0.5–1.5)
Methemoglobin: 0.7 % (ref 0.0–1.5)
O2 Saturation: 66.4 %
Total hemoglobin: 15.5 g/dL (ref 12.0–16.0)

## 2020-06-04 MED ORDER — MAGNESIUM SULFATE 2 GM/50ML IV SOLN
2.0000 g | Freq: Once | INTRAVENOUS | Status: AC
  Administered 2020-06-04: 2 g via INTRAVENOUS
  Filled 2020-06-04: qty 50

## 2020-06-04 MED ORDER — POTASSIUM CHLORIDE CRYS ER 20 MEQ PO TBCR
40.0000 meq | EXTENDED_RELEASE_TABLET | Freq: Once | ORAL | Status: AC
  Administered 2020-06-04: 40 meq via ORAL
  Filled 2020-06-04: qty 2

## 2020-06-04 MED ORDER — MENTHOL 3 MG MT LOZG
1.0000 | LOZENGE | OROMUCOSAL | Status: DC | PRN
  Administered 2020-06-04 – 2020-06-05 (×2): 3 mg via ORAL
  Filled 2020-06-04: qty 9

## 2020-06-04 NOTE — Progress Notes (Signed)
Patient ID: Kyle Pearson, male   DOB: Feb 19, 1976, 44 y.o.   MRN: [id number]     Advanced Heart Failure Rounding Note  PCP-Cardiologist: No primary care provider on file.   Subjective:    IV lasix stopped yesterday. Milrinone turned down to 0.125.   Feels good except for ab cramping. Denies orthopnea or PND. Mild DOE.   CVP 6. Co-ox 66% K 3.3. Creatinine stable  LHC/RHC:  Coronary Findings  Diagnostic Dominance: Right Left Main  No significant coronary disease.  Left Anterior Descending  No significant coronary disease.  Ramus Intermedius  No significant coronary disease.  Left Circumflex  Relatively small vessel in setting of large RCA. No significant coronary disease.  Right Coronary Artery  No significant coronary disease.  Intervention  No interventions have been documented. Right Heart  Right Heart Pressures RHC Procedural Findings: Hemodynamics (mmHg) RA mean 29 RV 66/32 PA 69/32, mean 49 PCWP mean 43 LV 111/47 AO 111/89  Oxygen saturations: PA 63% AO 98%  Cardiac Output (Fick) 4.91  Cardiac Index (Fick) 2.1 PVR 1.22   Cardiac Output (Thermo) 3.96  Cardiac Index (Thermo) 1.7     Objective:   Weight Range: 116.9 kg Body mass index is 41.59 kg/m.   Vital Signs:   Temp:  [97.9 F (36.6 C)-98.6 F (37 C)] 97.9 F (36.6 C) (12/05 1031) Pulse Rate:  [81-94] 92 (12/05 1031) Resp:  [15-19] 19 (12/05 1031) BP: (99-117)/(54-80) 100/68 (12/05 1031) SpO2:  [91 %-95 %] 91 % (12/05 1031) Weight:  [116.9 kg] 116.9 kg (12/05 0322) Last BM Date: 06/02/20 (per patient RN did not witness)  Weight change: Filed Weights   06/02/20 0627 06/03/20 0430 06/04/20 0322  Weight: 124.8 kg 117.9 kg 116.9 kg    Intake/Output:   Intake/Output Summary (Last 24 hours) at 06/04/2020 1112 Last data filed at 06/04/2020 1015 Gross per 24 hour  Intake 973.69 ml  Output 3900 ml  Net -2926.31 ml      Physical Exam    General:  Well appearing. No resp  difficulty HEENT: normal Neck: supple. CVP 6-7 Carotids 2+ bilat; no bruits. No lymphadenopathy or thryomegaly appreciated. Cor: PMI nondisplaced. Regular rate & rhythm. No rubs, gallops or murmurs. Lungs: clear Abdomen: obese soft, nontender, nondistended. No hepatosplenomegaly. No bruits or masses. Good bowel sounds. Extremities: no cyanosis, clubbing, rash, edema Neuro: alert & orientedx3, cranial nerves grossly intact. moves all 4 extremities w/o difficulty. Affect pleasant   Telemetry   NSR 90s Personally reviewed  Labs    CBC Recent Labs    06/01/20 1720  WBC 7.8  HGB 13.2  HCT 40.1  MCV 86.6  PLT 216   Basic Metabolic Panel Recent Labs    64/40/34 0427 06/02/20 0825 06/03/20 0330 06/04/20 0110  NA   < >  --  131* 129*  K   < >  --  3.3* 3.3*  CL   < >  --  81* 82*  CO2   < >  --  37* 35*  GLUCOSE   < >  --  127* 162*  BUN   < >  --  12 13  CREATININE   < >  --  1.08 1.06  CALCIUM   < >  --  9.9 9.3  MG  --  1.6* 1.9  --    < > = values in this interval not displayed.   Liver Function Tests No results for input(s): AST, ALT, ALKPHOS, BILITOT, PROT, ALBUMIN in the  last 72 hours. No results for input(s): LIPASE, AMYLASE in the last 72 hours. Cardiac Enzymes No results for input(s): CKTOTAL, CKMB, CKMBINDEX, TROPONINI in the last 72 hours.  BNP: BNP (last 3 results) Recent Labs    05/29/20 1255  BNP 1,020.9*    ProBNP (last 3 results) No results for input(s): PROBNP in the last 8760 hours.   D-Dimer No results for input(s): DDIMER in the last 72 hours. Hemoglobin A1C No results for input(s): HGBA1C in the last 72 hours. Fasting Lipid Panel No results for input(s): CHOL, HDL, LDLCALC, TRIG, CHOLHDL, LDLDIRECT in the last 72 hours. Thyroid Function Tests Recent Labs    06/01/20 1720  TSH 2.095    Other results:   Imaging    No results found.   Medications:     Scheduled Medications: . Chlorhexidine Gluconate Cloth  6 each  Topical Daily  . dapagliflozin propanediol  10 mg Oral Daily  . digoxin  0.125 mg Oral Daily  . enoxaparin (LOVENOX) injection  60 mg Subcutaneous Q24H  . folic acid  1 mg Oral Daily  . furosemide  40 mg Oral Daily  . metoprolol succinate  25 mg Oral Daily  . multivitamin with minerals  1 tablet Oral Daily  . potassium chloride SA  40 mEq Oral Daily  . sacubitril-valsartan  1 tablet Oral BID  . sodium chloride flush  10-40 mL Intracatheter Q12H  . spironolactone  25 mg Oral Daily  . thiamine  100 mg Oral Daily    Infusions: . sodium chloride      PRN Medications: sodium chloride, acetaminophen, bisacodyl, guaiFENesin, ondansetron (ZOFRAN) IV, sodium chloride flush   Assessment/Plan   1. Acute on chronic systolic CHF: Nonischemic cardiomyopathy.  Symptomatic since 9/21 (after episode of peri-tonsillar abscess).  He was a prior drinker (none since 9/21) but does not seem to describe heavy enough ETOH to cause profound cardiomyopathy (though he did have mild cirrhosis on abdominal US at [facility]).  No FH of cardiomyopathy.  Born in [place], has been in [place] x many years.  T cruzi testing sent at [facility].  HIV negative 9/21.  Echo this admission with EF < 20% with severe LV dilation, moderate RVE with severely decreased systolic function, severe biatrial enlargement, mild MR. RHC/LHC 12/2 showed no coronary disease, markedly elevated filling pressures, and low cardiac output.  Milrinone 0.25 + Lasix gtt 12 mg/hr started.  Lasix gtt stopped 12/4. Milrinone turned down to 0.125. Co-ox 66% this morning, CVP 6. Weight down 34 pounds total  Creatinine stable.  - Stop milrinone - Hold po lasix for now (got a dose this am) - Continue Entresto 24/26 bid.   - Continue digoxin 0.125 daily.  - Continue Toprol XL 25 daily.   - Continue spironolactone 25 mg daily.  - Dapagliflozin 10 mg daily.   - Unna boots - Cardiac MRI on Monday  2. ETOH: No longer drinks.  Does not seem to have been heavy enough  drinker to cause CMP, but he did have early cirrhosis on abdominal US at [facility].  However, with obesity could be NASH.  3. Suspect OSA.  4. Hypokalemia - supp this am   Length of Stay: 6  Arvilla Meres, MD  06/04/2020, 11:12 AM  Advanced Heart Failure Team Pager [phone] (M-F; 7a - 4p)  Please contact [facility] for night-coverage after hours (4p -7a ) and weekends on [email or web address]

## 2020-06-04 NOTE — Care Management (Signed)
06-04-20 5789 Benefits check submitted for [place]. Case Manager will follow for cost. Graves-Bigelow, Lamar Laundry, RN,BSN Case Manager

## 2020-06-04 NOTE — Plan of Care (Signed)

## 2020-06-04 NOTE — Progress Notes (Signed)
RN noticed the patient had a 24 beat run of V-tach followed by a 14 beat run of V tach at 1143, RN went to the room and assessed the patient who was sleeping and asymptomatic. Dr. Gala Romney called who gave the RN verbal orders for 2g of magnesium sulfate IV and 40 mEq of K with a BMP when the magesium was completed. RN will continue to monitor.

## 2020-06-05 ENCOUNTER — Inpatient Hospital Stay (HOSPITAL_COMMUNITY): Payer: No Typology Code available for payment source

## 2020-06-05 ENCOUNTER — Telehealth (HOSPITAL_COMMUNITY): Payer: Self-pay | Admitting: Pharmacist

## 2020-06-05 DIAGNOSIS — I509 Heart failure, unspecified: Secondary | ICD-10-CM

## 2020-06-05 DIAGNOSIS — I5021 Acute systolic (congestive) heart failure: Secondary | ICD-10-CM | POA: Diagnosis not present

## 2020-06-05 LAB — BASIC METABOLIC PANEL
Anion gap: 10 (ref 5–15)
BUN: 13 mg/dL (ref 6–20)
CO2: 30 mmol/L (ref 22–32)
Calcium: 9.2 mg/dL (ref 8.9–10.3)
Chloride: 93 mmol/L — ABNORMAL LOW (ref 98–111)
Creatinine, Ser: 0.96 mg/dL (ref 0.61–1.24)
GFR, Estimated: >60 mL/min (ref >60)
Glucose, Bld: 114 mg/dL — ABNORMAL HIGH (ref 70–99)
Potassium: 3.9 mmol/L (ref 3.5–5.1)
Sodium: 133 mmol/L — ABNORMAL LOW (ref 135–145)

## 2020-06-05 LAB — COOXEMETRY PANEL
Carboxyhemoglobin: 0.9 % (ref 0.5–1.5)
Methemoglobin: 0.6 % (ref 0.0–1.5)
O2 Saturation: 73.8 %
Total hemoglobin: 15.7 g/dL (ref 12.0–16.0)

## 2020-06-05 MED ORDER — IVABRADINE HCL 5 MG PO TABS
5.0000 mg | ORAL_TABLET | Freq: Two times a day (BID) | ORAL | Status: DC
  Administered 2020-06-05 – 2020-06-06 (×3): 5 mg via ORAL
  Filled 2020-06-05 (×4): qty 1

## 2020-06-05 MED ORDER — GADOBUTROL 1 MMOL/ML IV SOLN
10.0000 mL | Freq: Once | INTRAVENOUS | Status: AC | PRN
  Administered 2020-06-05: 10 mL via INTRAVENOUS

## 2020-06-05 NOTE — Progress Notes (Signed)
Orthopedic Tech Progress Note Patient Details:  Kyle Pearson July 24, 1975 [id number]  Ortho Devices Type of Ortho Device: Roland Rack boot Ortho Device/Splint Location: Bilateral Lower Extremity Ortho Device/Splint Interventions: Other (comment)   Post Interventions Patient Tolerated: Refused intervention, Patient did not want unna boots applied because he said he is being discharged tomorrow and plans on taking them off as soon as he leaves.   Gerald Stabs 06/05/2020, 8:41 PM

## 2020-06-05 NOTE — Progress Notes (Signed)
CARDIAC REHAB PHASE I   PRE:  Rate/Rhythm: 110 ST    BP: sitting 101/75    SaO2: 95 RA  MODE:  Ambulation: 480 ft   POST:  Rate/Rhythm: 118 ST    BP: sitting 130/73     SaO2: 96 RA  Pt ambulated without c/o. HR 110s. BP stable. Gave HF booklet to begin reading, will discuss tomorrow as he is eating right now. [id number]   Harriet Masson CES, ACSM 06/05/2020 2:20 PM

## 2020-06-05 NOTE — TOC Benefit Eligibility Note (Signed)
Transition of Care [facility]) Benefit Eligibility Note    Patient Details  Name: Kyle Pearson MRN: [id number] Date of Birth: 10-09-1975   Medication/Dose: 1.FARXIGA 10mg  // 2. ENTRESTO  Covered?: Yes     Prescription Coverage Preferred Pharmacy: patient can use [facility] or [facility]  Spoke with Person/Company/Phone Number:: [facility] [phone]  Co-Pay: 1. 25% of total cost of medication aat pharmacy // 2.25% of total cost of medication aat pharmacy  Prior Approval: No     [id number] Dia Donate Phone Number: 06/05/2020, 9:22 AM

## 2020-06-05 NOTE — Telephone Encounter (Signed)
Advanced Heart Failure Patient Advocate Encounter  Prior Authorization for Corlanor has been approved.    Karle Plumber, PharmD, BCPS, BCCP, CPP Heart Failure Clinic Pharmacist [phone]

## 2020-06-05 NOTE — Progress Notes (Signed)
Patient ID: Kyle Pearson, male   DOB: 1976/05/09, 44 y.o.   MRN: [id number]     Advanced Heart Failure Rounding Note  PCP-Cardiologist: No primary care provider on file.   Subjective:    Milrinone now off.  CVP 6, co-ox 74%.    No complaints today, walked in hall without dyspnea yesterday.    LHC/RHC:  Coronary Findings  Diagnostic Dominance: Right Left Main  No significant coronary disease.  Left Anterior Descending  No significant coronary disease.  Ramus Intermedius  No significant coronary disease.  Left Circumflex  Relatively small vessel in setting of large RCA. No significant coronary disease.  Right Coronary Artery  No significant coronary disease.  Intervention  No interventions have been documented. Right Heart  Right Heart Pressures RHC Procedural Findings: Hemodynamics (mmHg) RA mean 29 RV 66/32 PA 69/32, mean 49 PCWP mean 43 LV 111/47 AO 111/89  Oxygen saturations: PA 63% AO 98%  Cardiac Output (Fick) 4.91  Cardiac Index (Fick) 2.1 PVR 1.22   Cardiac Output (Thermo) 3.96  Cardiac Index (Thermo) 1.7     Objective:   Weight Range: 116.8 kg Body mass index is 41.56 kg/m.   Vital Signs:   Temp:  [97.6 F (36.4 C)-98.1 F (36.7 C)] 97.6 F (36.4 C) (12/06 0736) Pulse Rate:  [92-104] 98 (12/06 0716) Resp:  [14-20] 14 (12/06 0716) BP: (99-111)/(68-81) 99/74 (12/06 0716) SpO2:  [91 %-100 %] 96 % (12/06 0716) Weight:  [116.8 kg] 116.8 kg (12/06 0300) Last BM Date: 06/02/20 (per patient RN did not witness)  Weight change: Filed Weights   06/03/20 0430 06/04/20 0322 06/05/20 0300  Weight: 117.9 kg 116.9 kg 116.8 kg    Intake/Output:   Intake/Output Summary (Last 24 hours) at 06/05/2020 0944 Last data filed at 06/05/2020 0716 Gross per 24 hour  Intake 82.97 ml  Output 4800 ml  Net -4717.03 ml      Physical Exam    General: NAD Neck: No JVD, no thyromegaly or thyroid nodule.  Lungs: Clear to auscultation bilaterally  with normal respiratory effort. CV: Nondisplaced PMI.  Heart regular S1/S2, no S3/S4, no murmur.  No peripheral edema.   Abdomen: Soft, nontender, no hepatosplenomegaly, no distention.  Skin: Intact without lesions or rashes.  Neurologic: Alert and oriented x 3.  Psych: Normal affect. Extremities: No clubbing or cyanosis.  HEENT: Normal.    Telemetry   NSR 100s Personally reviewed  Labs    CBC No results for input(s): WBC, NEUTROABS, HGB, HCT, MCV, PLT in the last 72 hours. Basic Metabolic Panel Recent Labs    40/98/11 0330 06/04/20 0110 06/04/20 1340 06/05/20 0443  NA 131*   < > 128* 133*  K 3.3*   < > 3.8 3.9  CL 81*   < > 85* 93*  CO2 37*   < > 32 30  GLUCOSE 127*   < > 148* 114*  BUN 12   < > 13 13  CREATININE 1.08   < > 1.07 0.96  CALCIUM 9.9   < > 9.0 9.2  MG 1.9  --   --   --    < > = values in this interval not displayed.   Liver Function Tests No results for input(s): AST, ALT, ALKPHOS, BILITOT, PROT, ALBUMIN in the last 72 hours. No results for input(s): LIPASE, AMYLASE in the last 72 hours. Cardiac Enzymes No results for input(s): CKTOTAL, CKMB, CKMBINDEX, TROPONINI in the last 72 hours.  BNP: BNP (last 3 results) Recent  Labs    05/29/20 1255  BNP 1,020.9*    ProBNP (last 3 results) No results for input(s): PROBNP in the last 8760 hours.   D-Dimer No results for input(s): DDIMER in the last 72 hours. Hemoglobin A1C No results for input(s): HGBA1C in the last 72 hours. Fasting Lipid Panel No results for input(s): CHOL, HDL, LDLCALC, TRIG, CHOLHDL, LDLDIRECT in the last 72 hours. Thyroid Function Tests No results for input(s): TSH, T4TOTAL, T3FREE, THYROIDAB in the last 72 hours.  Invalid input(s): FREET3  Other results:   Imaging    No results found.   Medications:     Scheduled Medications: . Chlorhexidine Gluconate Cloth  6 each Topical Daily  . dapagliflozin propanediol  10 mg Oral Daily  . digoxin  0.125 mg Oral Daily  .  enoxaparin (LOVENOX) injection  60 mg Subcutaneous Q24H  . folic acid  1 mg Oral Daily  . furosemide  40 mg Oral Daily  . metoprolol succinate  25 mg Oral Daily  . multivitamin with minerals  1 tablet Oral Daily  . potassium chloride SA  40 mEq Oral Daily  . sacubitril-valsartan  1 tablet Oral BID  . sodium chloride flush  10-40 mL Intracatheter Q12H  . spironolactone  25 mg Oral Daily  . thiamine  100 mg Oral Daily    Infusions: . sodium chloride      PRN Medications: sodium chloride, acetaminophen, bisacodyl, guaiFENesin, menthol-cetylpyridinium, ondansetron (ZOFRAN) IV, sodium chloride flush   Assessment/Plan   1. Acute on chronic systolic CHF: Nonischemic cardiomyopathy.  Symptomatic since 9/21 (after episode of peri-tonsillar abscess).  He was a prior drinker (none since 9/21) but does not seem to describe heavy enough ETOH to cause profound cardiomyopathy (though he did have mild cirrhosis on abdominal US at [facility]).  No FH of cardiomyopathy.  Born in [place], has been in [place] x many years.  T cruzi testing sent at [facility].  HIV negative 9/21.  Echo this admission with EF < 20% with severe LV dilation, moderate RVE with severely decreased systolic function, severe biatrial enlargement, mild MR. RHC/LHC 12/2 showed no coronary disease, markedly elevated filling pressures, and low cardiac output.  Milrinone 0.25 + Lasix gtt 12 mg/hr started.  Lasix gtt stopped 12/4. Milrinone stopped 12/5. Co-ox 74% this morning, CVP 6. Creatinine stable. - Can hold Lasix today, start po tomorrow. - Continue Entresto 24/26 bid.   - Continue digoxin 0.125 daily.  - Continue Toprol XL 25 daily.   - Continue spironolactone 25 mg daily.  - Dapagliflozin 10 mg daily.   - Unna boots - Add ivabradine with mild sinus tachycardia, would not push beta blocker further with soft BP and just stopped milrinone.  - Cardiac MRI ordered.  2. ETOH: No longer drinks.  Does not seem to have been heavy enough drinker to  cause CMP, but he did have early cirrhosis on abdominal US at [facility].  However, with obesity could be NASH.  3. Suspect OSA - Outpatient sleep study.   Length of Stay: 7  Marca Ancona, MD  06/05/2020, 9:44 AM  Advanced Heart Failure Team Pager [phone] (M-F; 7a - 4p)  Please contact [facility] for night-coverage after hours (4p -7a ) and weekends on [email or web address]

## 2020-06-05 NOTE — Telephone Encounter (Signed)
Patient Advocate Encounter   Received notification from [facility] that prior authorization for Corlanor is required.   PA submitted on CoverMyMeds Key [id number] Status is pending   Will continue to follow.   Karle Plumber, PharmD, BCPS, BCCP, CPP Heart Failure Clinic Pharmacist [phone]

## 2020-06-06 LAB — BASIC METABOLIC PANEL
Anion gap: 9 (ref 5–15)
BUN: 13 mg/dL (ref 6–20)
CO2: 30 mmol/L (ref 22–32)
Calcium: 8.9 mg/dL (ref 8.9–10.3)
Chloride: 95 mmol/L — ABNORMAL LOW (ref 98–111)
Creatinine, Ser: 0.92 mg/dL (ref 0.61–1.24)
GFR, Estimated: >60 mL/min (ref >60)
Glucose, Bld: 115 mg/dL — ABNORMAL HIGH (ref 70–99)
Potassium: 3.9 mmol/L (ref 3.5–5.1)
Sodium: 134 mmol/L — ABNORMAL LOW (ref 135–145)

## 2020-06-06 LAB — COOXEMETRY PANEL
Carboxyhemoglobin: 0.9 % (ref 0.5–1.5)
Methemoglobin: 0.7 % (ref 0.0–1.5)
O2 Saturation: 69.9 %
Total hemoglobin: 15.7 g/dL (ref 12.0–16.0)

## 2020-06-06 MED ORDER — DAPAGLIFLOZIN PROPANEDIOL 10 MG PO TABS: 10.0000 mg | ORAL_TABLET | Freq: Every day | ORAL | 6 refills | Status: DC

## 2020-06-06 MED ORDER — IVABRADINE HCL 5 MG PO TABS: 5.0000 mg | ORAL_TABLET | Freq: Two times a day (BID) | ORAL | 6 refills | Status: DC

## 2020-06-06 MED ORDER — SPIRONOLACTONE 25 MG PO TABS: 25.0000 mg | ORAL_TABLET | Freq: Every day | ORAL | 6 refills | Status: DC

## 2020-06-06 MED ORDER — THIAMINE HCL 100 MG PO TABS: 100.0000 mg | ORAL_TABLET | Freq: Every day | ORAL | 1 refills | Status: DC

## 2020-06-06 MED ORDER — SACUBITRIL-VALSARTAN 24-26 MG PO TABS: 1.0000 | ORAL_TABLET | Freq: Two times a day (BID) | ORAL | 6 refills | Status: DC

## 2020-06-06 MED ORDER — POTASSIUM CHLORIDE CRYS ER 20 MEQ PO TBCR: 40.0000 meq | EXTENDED_RELEASE_TABLET | Freq: Every day | ORAL | 6 refills | Status: DC

## 2020-06-06 MED ORDER — DIGOXIN 125 MCG PO TABS: 0.1250 mg | ORAL_TABLET | Freq: Every day | ORAL | 6 refills | Status: DC

## 2020-06-06 MED FILL — SPIRONOLACTONE 25 MG TABLET: 25 | 30 days supply | Qty: 30 | Fill #0

## 2020-06-06 MED FILL — DIGOXIN 0.125 MG TABLET: 125 | 30 days supply | Qty: 30 | Fill #0

## 2020-06-06 MED FILL — FARXIGA 10 MG TABLET: 10 | 30 days supply | Qty: 30 | Fill #0

## 2020-06-06 MED FILL — CORLANOR 5 MG TABLET: 5 | 30 days supply | Qty: 60 | Fill #0

## 2020-06-06 MED FILL — ENTRESTO 24 MG-26 MG TABLET: 24-26 | 30 days supply | Qty: 60 | Fill #0

## 2020-06-06 MED FILL — VITAMIN B-1 100 MG TABS: 100 | 30 days supply | Qty: 30 | Fill #0

## 2020-06-06 NOTE — Plan of Care (Signed)
?  Problem: Education: ?Goal: Ability to demonstrate management of disease process will improve ?Outcome: Progressing ?Goal: Ability to verbalize understanding of medication therapies will improve ?Outcome: Progressing ?Goal: Individualized Educational Video(s) ?Outcome: Progressing ?  ?Problem: Activity: ?Goal: Capacity to carry out activities will improve ?Outcome: Progressing ?  ?Problem: Cardiac: ?Goal: Ability to achieve and maintain adequate cardiopulmonary perfusion will improve ?Outcome: Progressing ?  ?Problem: Education: ?Goal: Knowledge of General Education information will improve ?Description: Including pain rating scale, medication(s)/side effects and non-pharmacologic comfort measures ?Outcome: Progressing ?  ?Problem: Health Behavior/Discharge Planning: ?Goal: Ability to manage health-related needs will improve ?Outcome: Progressing ?  ?Problem: Clinical Measurements: ?Goal: Ability to maintain clinical measurements within normal limits will improve ?Outcome: Progressing ?Goal: Will remain free from infection ?Outcome: Progressing ?Goal: Diagnostic test results will improve ?Outcome: Progressing ?Goal: Respiratory complications will improve ?Outcome: Progressing ?Goal: Cardiovascular complication will be avoided ?Outcome: Progressing ?  ?Problem: Nutrition: ?Goal: Adequate nutrition will be maintained ?Outcome: Progressing ?  ?Problem: Coping: ?Goal: Level of anxiety will decrease ?Outcome: Progressing ?  ?Problem: Elimination: ?Goal: Will not experience complications related to bowel motility ?Outcome: Progressing ?Goal: Will not experience complications related to urinary retention ?Outcome: Progressing ?  ?Problem: Pain Managment: ?Goal: General experience of comfort will improve ?Outcome: Progressing ?  ?Problem: Safety: ?Goal: Ability to remain free from injury will improve ?Outcome: Progressing ?  ?Problem: Skin Integrity: ?Goal: Risk for impaired skin integrity will decrease ?Outcome:  Progressing ?  ?

## 2020-06-06 NOTE — Progress Notes (Addendum)
Patient ID: Kyle Pearson, male   DOB: Mar 01, 1976, 44 y.o.   MRN: [id number]     Advanced Heart Failure Rounding Note  PCP-Cardiologist: No primary care provider on file.   Subjective:   Feeling good today. Denies SOB.  LHC/RHC:  Coronary Findings  Diagnostic Dominance: Right Left Main  No significant coronary disease.  Left Anterior Descending  No significant coronary disease.  Ramus Intermedius  No significant coronary disease.  Left Circumflex  Relatively small vessel in setting of large RCA. No significant coronary disease.  Right Coronary Artery  No significant coronary disease.  Intervention  No interventions have been documented. Right Heart  Right Heart Pressures RHC Procedural Findings: Hemodynamics (mmHg) RA mean 29 RV 66/32 PA 69/32, mean 49 PCWP mean 43 LV 111/47 AO 111/89  Oxygen saturations: PA 63% AO 98%  Cardiac Output (Fick) 4.91  Cardiac Index (Fick) 2.1 PVR 1.22   Cardiac Output (Thermo) 3.96  Cardiac Index (Thermo) 1.7     Objective:   Weight Range: 116.2 kg Body mass index is 41.35 kg/m.   Vital Signs:   Temp:  [97.7 F (36.5 C)-98.6 F (37 C)] 97.7 F (36.5 C) (12/07 0400) Pulse Rate:  [99-104] 99 (12/07 0400) Resp:  [15-20] 16 (12/07 0400) BP: (93-107)/(76-81) 93/81 (12/07 0400) SpO2:  [93 %-96 %] 93 % (12/07 0400) Weight:  [116.2 kg] 116.2 kg (12/07 0705) Last BM Date: 06/02/20  Weight change: Filed Weights   06/04/20 0322 06/05/20 0300 06/06/20 0705  Weight: 116.9 kg 116.8 kg 116.2 kg    Intake/Output:   Intake/Output Summary (Last 24 hours) at 06/06/2020 0738 Last data filed at 06/06/2020 0320 Gross per 24 hour  Intake 10 ml  Output 2575 ml  Net -2565 ml      Physical Exam   General:  No resp difficulty HEENT: normal Neck: supple. no JVD. Carotids 2+ bilat; no bruits. No lymphadenopathy or thryomegaly appreciated. Cor: PMI nondisplaced. Regular rate & rhythm. No rubs, gallops or murmurs. Lungs:  clear Abdomen: soft, nontender, nondistended. No hepatosplenomegaly. No bruits or masses. Good bowel sounds. Extremities: no cyanosis, clubbing, rash, edema. RUE PICC  Neuro: alert & orientedx3, cranial nerves grossly intact. moves all 4 extremities w/o difficulty. Affect pleasant  Telemetry   NSR with occasional PVCs. 90-100s   Labs    CBC No results for input(s): WBC, NEUTROABS, HGB, HCT, MCV, PLT in the last 72 hours. Basic Metabolic Panel Recent Labs    97/35/32 0443 06/06/20 0347  NA 133* 134*  K 3.9 3.9  CL 93* 95*  CO2 30 30  GLUCOSE 114* 115*  BUN 13 13  CREATININE 0.96 0.92  CALCIUM 9.2 8.9   Liver Function Tests No results for input(s): AST, ALT, ALKPHOS, BILITOT, PROT, ALBUMIN in the last 72 hours. No results for input(s): LIPASE, AMYLASE in the last 72 hours. Cardiac Enzymes No results for input(s): CKTOTAL, CKMB, CKMBINDEX, TROPONINI in the last 72 hours.  BNP: BNP (last 3 results) Recent Labs    05/29/20 1255  BNP 1,020.9*    ProBNP (last 3 results) No results for input(s): PROBNP in the last 8760 hours.   D-Dimer No results for input(s): DDIMER in the last 72 hours. Hemoglobin A1C No results for input(s): HGBA1C in the last 72 hours. Fasting Lipid Panel No results for input(s): CHOL, HDL, LDLCALC, TRIG, CHOLHDL, LDLDIRECT in the last 72 hours. Thyroid Function Tests No results for input(s): TSH, T4TOTAL, T3FREE, THYROIDAB in the last 72 hours.  Invalid input(s): FREET3  Other results:   Imaging    MR CARDIAC MORPHOLOGY W WO CONTRAST  Result Date: 06/05/2020 CLINICAL DATA:  Cardiomyopathy of uncertain etiology EXAM: CARDIAC MRI TECHNIQUE: The patient was scanned on a 1.5 Tesla GE magnet. A dedicated cardiac coil was used. Functional imaging was done using Fiesta sequences. 2,3, and 4 chamber views were done to assess for RWMA's. Modified Simpson's rule using a short axis stack was used to calculate an ejection fraction on a dedicated work  [employer]. The patient received 10 cc of Gadavist. After 10 minutes inversion recovery sequences were used to assess for infiltration and scar tissue. CONTRAST:  Gadavist 10 cc FINDINGS: Limited images of the lung fields showed no gross abnormalities. Trivial pericardial effusion. Severely dilated left ventricle with diffuse hypokinesis, EF 17%. Normal wall thickness. No evidence for LV noncompaction or LV thrombus. Moderately dilated right ventricle with EF 20%. Moderately dilated left atrium, mildly dilated right atrium. Mild mitral regurgitation. Trileaflet aortic valve, no significant regurgitation or stenosis. On delayed enhancement imaging, there was diffuse mid-wall late gadolinium enhancement throughout the interventricular septum and in the basal to mid inferior and inferoseptal walls. Measurements: T2 46 in the septum LVEDV 371 mL LVSV 64 mL LVEF 17% RVEDV 217 mL RVSV 42 mL RVEF 20% IMPRESSION: 1.  Severely dilated LV with diffuse hypokinesis, EF 17%. 2.  Moderately dilated RV with EF 20%. 3. Diffuse mid-wall LGE in the septum, inferior wall, and inferolateral wall. Consider prior myocarditis with diffuse mid-wall LGE. T2 not elevated, but could be old myocarditis. Daysy Santini Electronically Signed   By: Marca Ancona M.D.   On: 06/05/2020 18:36     Medications:     Scheduled Medications: . Chlorhexidine Gluconate Cloth  6 each Topical Daily  . dapagliflozin propanediol  10 mg Oral Daily  . digoxin  0.125 mg Oral Daily  . enoxaparin (LOVENOX) injection  60 mg Subcutaneous Q24H  . folic acid  1 mg Oral Daily  . furosemide  40 mg Oral Daily  . ivabradine  5 mg Oral BID WC  . metoprolol succinate  25 mg Oral Daily  . multivitamin with minerals  1 tablet Oral Daily  . potassium chloride SA  40 mEq Oral Daily  . sacubitril-valsartan  1 tablet Oral BID  . sodium chloride flush  10-40 mL Intracatheter Q12H  . spironolactone  25 mg Oral Daily  . thiamine  100 mg Oral  Daily    Infusions: . sodium chloride      PRN Medications: sodium chloride, acetaminophen, bisacodyl, guaiFENesin, menthol-cetylpyridinium, ondansetron (ZOFRAN) IV, sodium chloride flush   Assessment/Plan   1. Acute on chronic systolic CHF: Nonischemic cardiomyopathy.  Symptomatic since 9/21 (after episode of peri-tonsillar abscess).  He was a prior drinker (none since 9/21) but does not seem to describe heavy enough ETOH to cause profound cardiomyopathy (though he did have mild cirrhosis on abdominal US at [facility]).  No FH of cardiomyopathy.  Born in [place], has been in [place] x many years.  T cruzi testing sent at [facility].  HIV negative 9/21.  Echo this admission with EF < 20% with severe LV dilation, moderate RVE with severely decreased systolic function, severe biatrial enlargement, mild MR. RHC/LHC 12/2 showed no coronary disease, markedly elevated filling pressures, and low cardiac output.  Milrinone 0.25 + Lasix gtt 12 mg/hr started.  Lasix gtt stopped 12/4. Milrinone stopped 12/5. Co-ox 70% this morning. Renal function stable.  - CVP 6. Start lasix 40 mg daily.  Continue Entresto 24/26 bid.   - Continue digoxin 0.125 daily.  - Continue Toprol XL 25 daily.   - Continue spironolactone 25 mg daily.  - Dapagliflozin 10 mg daily.   - Unna boots - Continue ivabradine with mild sinus tachycardia, would not push beta blocker further with soft BP and just stopped milrinone.  - Cardiac MRI- EF 17^ RV 20%. Possible prior myocarditis and scar noted.  2. ETOH: No longer drinks.  Does not seem to have been heavy enough drinker to cause CMP, but he did have early cirrhosis on abdominal US at [facility].  However, with obesity could be NASH.  3. Suspect OSA - Outpatient sleep study.   Due to scar will need Life Vest. Will contact [employer] rep.   Home today. Will set up follow up. .   Length of Stay: 8  Amy Clegg, NP  06/06/2020, 7:38 AM  Advanced Heart Failure Team Pager [phone] (M-F; 7a - 4p)   Please contact [facility] for night-coverage after hours (4p -7a ) and weekends on [email or web address]  Patient seen with NP, agree with the above note.   Co-ox 70%, CVP 6.  No complaints.    Cardiac MRI was done yesterday:  1.  Severely dilated LV with diffuse hypokinesis, EF 17%. 2.  Moderately dilated RV with EF 20%. 3. Diffuse mid-wall LGE in the septum, inferior wall, and inferolateral wall.  Consider prior myocarditis with diffuse mid-wall LGE. T2 not elevated, but could be old myocarditis.  General: NAD Neck: No JVD, no thyromegaly or thyroid nodule.  Lungs: Clear to auscultation bilaterally with normal respiratory effort. CV: Nondisplaced PMI.  Heart regular S1/S2, no S3/S4, no murmur.  No peripheral edema.   Abdomen: Soft, nontender, no hepatosplenomegaly, no distention.  Skin: Intact without lesions or rashes.  Neurologic: Alert and oriented x 3.  Psych: Normal affect. Extremities: No clubbing or cyanosis.  HEENT: Normal.   Doing well today, no BP room to titrate meds.   - Continue current meds without change for home.  He will be on Lasix 40 mg po daily.   Significant scar burden on MRI, will arrange for Lifevest at home while we wait to see if he needs ICD.   He will need aggressive medication titration and close followup with profound cardiomyopathy.  Discussed with patient.   Marca Ancona 06/06/2020 7:54 AM

## 2020-06-06 NOTE — Progress Notes (Signed)
Pt has been independent, feeling well. He read materials. Discussed Lifevest video, HF booklet, low sodium, exercise and CRPII. Pt very receptive. Will refer to [place] CRPII. Gave videos to view today (HF and Lifevest). [id number] Ethelda Chick CES, ACSM 11:34 AM 06/06/2020

## 2020-06-06 NOTE — Care Management (Signed)
Case management spoke with Fredirick Maudlin, CM with life vest and requested clinicals sent to [phone] to assist in obtaining life vest.  H&P, MD note and CV procedure notes sent to Fredirick Maudlin.

## 2020-06-06 NOTE — Progress Notes (Signed)
   Ordered Retail banker.   I spoke [employer] rep. He will be fitted today.  Avriel Kandel NP-C  7:51 AM

## 2020-06-06 NOTE — Discharge Summary (Signed)
Advanced Heart Failure Team  Discharge Summary   Patient ID: Kyle Pearson MRN: [id number], DOB/AGE: 08-22-1975 44 y.o. Admit date: 05/29/2020 D/C date:     06/06/2020   Primary Discharge Diagnoses:  1. A/C Systolic Heart Failure 2. ETOH 3. Suspected Sleep Apnea.   Hospital Course:  Kyle Pearson is a 44 year old with history of tobacco and alcohol use, recent diagnosed with heart failure and cirrhosis.  He went to [facility] on 04/24/2020 for shortness of breath, abdominal distention and left sided chest pain. 2D echo showed EF 15-20% with severe LVH, BNP 562 and troponin 54.  He was started on lisinopril 5 mg, toprol-XL 25 mg PO daily and lasix IV. Blood pressure was low and required albumin 12.5 mg IV x 3 doses to help with BP.  He also had an abdominal ultrasound which showed no significant asites but note mild liver cirrhosis with mild gallbladder wall thickening. He was discharge Pearson on 40 mg PO lasix, KCL 20 mEq, lisinopril 5 and toprol-XL 25.    Admitted with marked volume overload. Diuresed with IV lasix and set for cath. Cath showed elevated filling pressures and low ouput. Milrinone was added and he continued to diurese on IV. Milrinone later weaned off with stable mixed venous saturation. Started on  GDMT with 4 corner stone Kyle meds. Had CMRI with severely reduced EF and extensive scar. Due to concern for ectopy from scare he was discharged with Life Vest.   See below for detailed problems list. He will contine to be followed closely in the [facility].     1. Acute on chronic systolic CHF: Nonischemic cardiomyopathy.  Symptomatic since 9/21 (after episode of peri-tonsillar abscess). He was a prior drinker (none since 9/21) but does not seem to describe heavy enough ETOH to cause profound cardiomyopathy (though he did have mild cirrhosis on abdominal [facility] at [facility]). No FH of cardiomyopathy. Born in [place], has been in [place] x many years. T cruzi testing sent at [facility]. HIV  negative 9/21. Echo this admission with EF <20% with severe LV dilation, moderate RVE with severely decreased systolic function, severe biatrial enlargement, mild Kyle. RHC/LHC 12/2 showed no coronary disease, markedly elevated filling pressures, and low cardiac output.  Milrinone 0.25 + Lasix gtt 12 mg/hr started.  Lasix gtt stopped 12/4. Milrinone stopped 12/5.  - CO-OX remained stable. Renal function stable.  - Started back on lasix 40 mg dialy.   -Continue Entresto 24/26 bid.   - Continue digoxin 0.125 daily.  - Continue Toprol XL 25 daily.   - Continue spironolactone 25 mg daily.  - Dapagliflozin 10 mg daily.   - Continue ivabradine with mild sinus tachycardia, would not push beta blocker further with soft BP and just stopped milrinone. Prior authorization approved.  - Cardiac MRI- EF 17^ RV 20%. Possible prior myocarditis and scar noted. --> Life Vest ordered for d/c  2. ETOH: No longer drinks. Does not seem to have been heavy enough drinker to cause CMP, but he did have early cirrhosis on abdominal [facility] at [facility]. However, with obesity could be NASH.  3. Suspect OSA - Outpatient sleep study. - Set up sleep study at discharge.      Discharge Vitals: Blood pressure 104/77, pulse 90, temperature 97.6 F (36.4 C), temperature source Oral, resp. rate (!) 22, height 5\' 6"  (1.676 m), weight 116.2 kg, SpO2 94 %.  Labs: Lab Results  Component Value Date   WBC 7.8 06/01/2020   HGB 13.2  06/01/2020   HCT 40.1 06/01/2020   MCV 86.6 06/01/2020   PLT 216 06/01/2020    Recent Labs  Lab 06/06/20 0347  NA 134*  K 3.9  CL 95*  CO2 30  BUN 13  CREATININE 0.92  CALCIUM 8.9  GLUCOSE 115*   No results found for: CHOL, HDL, LDLCALC, TRIG BNP (last 3 results) Recent Labs    05/29/20 1255  BNP 1,020.9*    ProBNP (last 3 results) No results for input(s): PROBNP in the last 8760 hours.   Diagnostic Studies/Procedures   Kyle CARDIAC MORPHOLOGY W WO CONTRAST  Result Date:  06/05/2020 CLINICAL DATA:  Cardiomyopathy of uncertain etiology EXAM: CARDIAC MRI TECHNIQUE: The patient was scanned on a 1.5 Tesla GE magnet. A dedicated cardiac coil was used. Functional imaging was done using Fiesta sequences. 2,3, and 4 chamber views were done to assess for RWMA's. Modified Simpson's rule using a short axis stack was used to calculate an ejection fraction on a dedicated work Kyle Pearson. The patient received 10 cc of Gadavist. After 10 minutes inversion recovery sequences were used to assess for infiltration and scar tissue. CONTRAST:  Gadavist 10 cc FINDINGS: Limited images of the lung fields showed no gross abnormalities. Trivial pericardial effusion. Severely dilated left ventricle with diffuse hypokinesis, EF 17%. Normal wall thickness. No evidence for LV noncompaction or LV thrombus. Moderately dilated right ventricle with EF 20%. Moderately dilated left atrium, mildly dilated right atrium. Mild mitral regurgitation. Trileaflet aortic valve, no significant regurgitation or stenosis. On delayed enhancement imaging, there was diffuse mid-wall late gadolinium enhancement throughout the interventricular septum and in the basal to mid inferior and inferoseptal walls. Measurements: T2 46 in the septum LVEDV 371 mL LVSV 64 mL LVEF 17% RVEDV 217 mL RVSV 42 mL RVEF 20% IMPRESSION: 1.  Severely dilated LV with diffuse hypokinesis, EF 17%. 2.  Moderately dilated RV with EF 20%. 3. Diffuse mid-wall LGE in the septum, inferior wall, and inferolateral wall. Consider prior myocarditis with diffuse mid-wall LGE. T2 not elevated, but could be old myocarditis. Kyle Pearson Electronically Signed   By: Kyle Ancona M.D.   On: 06/05/2020 18:36   RHC 06/01/2020  RA mean 29 RV 66/32 PA 69/32, mean 49 PCWP mean 43 LV 111/47 AO 111/89 Oxygen saturations: PA 63% AO 98% Cardiac Output (Fick) 4.91  Cardiac Index (Fick) 2.1 PVR 1.22  Cardiac Output (Thermo) 3.96  Cardiac Index  (Thermo) 1. Discharge Medications   Allergies as of 06/06/2020      Reactions   Raspberry Anaphylaxis   Penicillins Rash      Medication List    STOP taking these medications   lisinopril 5 MG tablet Commonly known as: ZESTRIL     TAKE these medications   bisacodyl 5 MG EC tablet Commonly known as: DULCOLAX Take 10 mg by mouth as needed for mild constipation.   dapagliflozin propanediol 10 MG Tabs tablet Commonly known as: FARXIGA Take 1 tablet (10 mg total) by mouth daily.   digoxin 0.125 MG tablet Commonly known as: LANOXIN Take 1 tablet (0.125 mg total) by mouth daily.   furosemide 40 MG tablet Commonly known as: LASIX Take 40 mg by mouth daily.   ivabradine 5 MG Tabs tablet Commonly known as: CORLANOR Take 1 tablet (5 mg total) by mouth 2 (two) times daily with a meal.   metoprolol succinate 25 MG 24 hr tablet Commonly known as: TOPROL-XL Take 25 mg by mouth daily.   Multi-Vitamin tablet Take  1 tablet by mouth daily.   potassium chloride SA 20 MEQ tablet Commonly known as: KLOR-CON Take 2 tablets (40 mEq total) by mouth daily. What changed: how much to take   sacubitril-valsartan 24-26 MG Commonly known as: ENTRESTO Take 1 tablet by mouth 2 (two) times daily.   spironolactone 25 MG tablet Commonly known as: ALDACTONE Take 1 tablet (25 mg total) by mouth daily.   thiamine 100 MG tablet Take 1 tablet (100 mg total) by mouth daily.            Durable Medical Equipment  (From admission, onward)         Start     Ordered   06/06/20 0749  For [facility] use only DME Vest life vest  Once       Comments: Indication EF < 25%. NICM VT 150 bpm VF 200 BPM 150Jx5 Length of need: 3 months  Start Date: 06/06/2020   06/06/20 6270          Disposition   The patient will be discharged in stable condition to [facility] with Life Vest on.  Discharge Instructions    (HEART FAILURE PATIENTS) Call MD:  Anytime you have any of the following symptoms: 1) 3 pound  weight gain in 24 hours or 5 pounds in 1 week 2) shortness of breath, with or without a dry hacking cough 3) swelling in the hands, feet or stomach 4) if you have to sleep on extra pillows at night in order to breathe.   Complete by: As directed    Diet - low sodium heart healthy   Complete by: As directed    Heart Failure patients record your daily weight using the same scale at the same time of day   Complete by: As directed    Increase activity slowly   Complete by: As directed       Follow-up Information    [facility] Follow up on 06/21/2020.   Specialty: Cardiology Why: at 1000.  Contact information: [address] [id number] [place] [address] [phone]                Duration of Discharge Encounter: Greater than 35 minutes   Signed, Tonye Becket NP-C  06/06/2020, 9:28 AM

## 2020-06-06 NOTE — TOC Transition Note (Signed)
Transition of Care [facility]) - CM/SW Discharge Note   Patient Details  Name: Kyle Pearson MRN: [id number] Date of Birth: 12/14/1975  [facility] of [facility] CM/SW Contact:  Leone Haven, RN Phone Number: 06/06/2020, 1:22 PM   Clinical Narrative:    Patient is for dc today, patient does not want una boots, asked the RN to give him ted hose.  He is receiving life vest today.  He has no other needs.   Final next level of care: Home/Self Care Barriers to Discharge: No Barriers Identified   Patient Goals and CMS Choice     Choice offered to / list presented to : NA  Discharge Placement                       Discharge Plan and Services                  DME Agency: NA       HH Arranged: NA          Social Determinants of Health (SDOH) Interventions     Readmission Risk Interventions No flowsheet data found.

## 2020-06-06 NOTE — Plan of Care (Signed)

## 2020-06-07 ENCOUNTER — Encounter (HOSPITAL_COMMUNITY): Payer: Self-pay | Admitting: *Deleted

## 2020-06-07 ENCOUNTER — Encounter (HOSPITAL_COMMUNITY): Payer: Self-pay | Admitting: Adult Health

## 2020-06-07 ENCOUNTER — Telehealth (HOSPITAL_COMMUNITY): Payer: Self-pay

## 2020-06-07 NOTE — Telephone Encounter (Signed)
Attempted to call patient in regards to Cardiac Rehab - LM on VM 

## 2020-06-20 NOTE — Progress Notes (Signed)
Advanced Heart Failure Clinic Note  PCP: [facility] HF physician: Dr. Shirlee Latch  HPI: Kyle Pearson is a 44 y.o. M PMH: tobacco and alcohol use, recent diagnosed with heart failure and cirrhosis.   Kyle Pearson reports no past medical history used to smoke less than 1/2 pack per day and drank about 6 pack of beer on the weekend.  He denies family history of heart disease but reported his father used to drink alcohol heavily but quit as well.  He states in 03/16/2020 he started to develop a cough and didn't want to mix medication with alcohol so he stopped drinking and started to take cough medication.  He noticed one day a very severe pain on his left side and pain with swallowing.  He was diagnosed with peritonsillar abscess and placed on clindamycin (300 mg po QID x 10 days) and decadron.  He reports feeling better throat wise but noticed increase swelling to his abdomen and difficulty breathing.  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  He continued to experience trouble breathing and walking short distances were difficulty.    He went to [facility] on 04/24/2020 for shortness of breath, abdominal distention and left sided chest pain. 2D echo showed EF 15-20% with severe LVH, BNP 562 and troponin 54.  He was started on lisinopril 5 mg, toprol-XL 25 mg PO daily and lasix IV. Blood pressure was low and required albumin 12.5 mg IV x 3 doses to help with BP.  He also had an abdominal ultrasound which showed no significant asites but note mild liver cirrhosis with mild gallbladder wall thickening. He was discharge home on 40 mg PO lasix, KCL 20 mEq, lisinopril 5 and toprol-XL 25.    He presented to 1st PCP appt on 11/29 found to have shortness of breath, bilaterally lower extremity edema and abdominal distention.  Reporting 30lb weight gain despite compliance with medication.  He was sent to [facility] , where his BNP was 1020  [facility] BNP 562), CXR showed cardiomegaly only.  Repeat echo revealed EF < 20%, severe decrease function with global hypokinesis, grade III DD, dilated LA/RA and small pericardial effusion. He was admitted 05/29/20-06/06/20 with marked volume overload. Diuresed with IV lasix and set for cath. Cath showed elevated filling pressures and low ouput. Milrinone was added and he continued to diurese on IV. Milrinone later weaned off with stable mixed venous saturation. Started on GDMT with 4 corner stone HF meds. Had cMRI with severely reduced EF and extensive scar. Due to concern for ectopy from scar he was discharged with Life Vest.  Diuresed 35 lbs. Discharge weight 255.6 lbs.  Today he returns for post hospital follow up. Overall feeling much better. Denies SOB, dizziness, CP, and PND/Orthopnea. Walking 5-10 minutes a day. Appetite ok. No fever or chills. Weight at home ~252 pounds. Taking all medications. Stopped smoking, drinks1-2 glasses wine on weekends. 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 Wearing LifeVest. Wife says he snores.   Cardiac Studies: RHC/LHC: 06/01/20 showed no coronary disease, markedly elevated filling pressures, and low cardiac output.  Echo: 05/30/20 with EF <20% with severe LV dilation, moderate RVE with severely decreased systolic function, severe biatrial enlargement, mild MR. cMRI: 06/05/20 Severely dilated LV with diffuse hypokinesis, EF 17%., mod dilated RV with EF 20%, diffuse mid-wall LGE in  septum, inferior wall & inferolateral wall  ROS: All systems negative except as listed in HPI, PMH and Problem List.  SH:  Social History   Socioeconomic History  . Marital status: Single    Spouse name: Not on file  . Number of children: Not on file  . Years of education: Not on file  . Highest education level: Not on file  Occupational History  . Not on file  Tobacco Use  . Smoking status:  Former Smoker    Quit date: 2020    Years since quitting: 1.9  . Smokeless tobacco: Never Used  Vaping Use  . Vaping Use: Never used  Substance and Sexual Activity  . Alcohol use: Not Currently  . Drug use: Never  . Sexual activity: Not on file  Other Topics Concern  . Not on file  Social History Narrative  . Not on file   Social Determinants of Health   Financial Resource Strain: Not on file  Food Insecurity: Not on file  Transportation Needs: Not on file  Physical Activity: Not on file  Stress: Not on file  Social Connections: Not on file  Intimate Partner Violence: Not on file    FH: No family history on file.  No past medical history on file.  Current Outpatient Medications  Medication Sig Dispense Refill  . bisacodyl (DULCOLAX) 5 MG EC tablet Take 10 mg by mouth as needed for mild constipation.     . dapagliflozin propanediol (FARXIGA) 10 MG TABS tablet Take 1 tablet (10 mg total) by mouth daily. 30 tablet 6  . digoxin (LANOXIN) 0.125 MG tablet Take 1 tablet (0.125 mg total) by mouth daily. 30 tablet 6  . furosemide (LASIX) 40 MG tablet Take 40 mg by mouth daily.    . ivabradine (CORLANOR) 5 MG TABS tablet Take 1 tablet (5 mg total) by mouth 2 (two) times daily with a meal. 60 tablet 6  . metoprolol succinate (TOPROL-XL) 25 MG 24 hr tablet Take 25 mg by mouth daily.    . Multiple Vitamin (MULTI-VITAMIN) tablet Take 1 tablet by mouth daily.    . potassium chloride SA (KLOR-CON) 20 MEQ tablet Take 2 tablets (40 mEq total) by mouth daily. (Patient taking differently: Take 20 mEq by mouth daily.) 60 tablet 6  . spironolactone (ALDACTONE) 25 MG tablet Take 1 tablet (25 mg total) by mouth daily. 30 tablet 6  . thiamine 100 MG tablet Take 1 tablet (100 mg total) by mouth daily. 30 tablet 1  . sacubitril-valsartan (ENTRESTO) 49-51 MG Take 1 tablet by mouth 2 (two) times daily. 60 tablet 6   No current facility-administered medications for this encounter.   Vitals:    06/21/20 1008  BP: 109/78  Pulse: 83  SpO2: 96%  Weight: 117.2 kg (258 lb 6.4 oz)     Wt Readings from Last 3 Encounters:  06/21/20 117.2 kg (258 lb 6.4 oz)  06/06/20 116.2 kg (256 lb 2.8 oz)  03/17/20 126 kg (277 lb 12.5 oz)   ECG: SR with 1st degree, 95 bpm (personally reviewed). LifeVest: no events, average daily use 20 hours.  PHYSICAL EXAM: General:  Well appearing. No resp difficulty HEENT: normal Neck: supple. JVP flat. Carotids 2+ bilaterally; no bruits. No lymphadenopathy or thryomegaly appreciated. Cor: PMI normal. Regular rate & rhythm. No rubs, gallops or murmurs. Lungs: clear Abdomen: obese, soft, nontender, nondistended. No hepatosplenomegaly. No bruits or masses. Good bowel sounds. Extremities: no cyanosis, clubbing, rash, edema Neuro: alert & oriented x 3,  cranial nerves grossly intact. Moves all 4 extremities w/o difficulty. Affect pleasant.  ASSESSMENT & PLAN: 1. Chronic systolic CHF: Nonischemic cardiomyopathy. Symptomatic since 9/21 (after episode of peri-tonsillar abscess). He was a prior drinker (none since 9/21) but does not seem to describe heavy enough ETOH to cause profound cardiomyopathy (though he did have mild cirrhosis on abdominal US at [facility]). No FH of cardiomyopathy. Born in [place], has been in [place] x many years. T cruzi testing sent at [facility]. HIV negative 9/21. Echo this admission with EF <20% with severe LV dilation, moderate RVE with severely decreased systolic function, severe biatrial enlargement, mild MR. RHC/LHC 12/2 showed no coronary disease, markedly elevated filling pressures, and low cardiac output.  - Stable NYHA II-III, volume status good today.  - Increase Entresto to 49/51 bid.  -Continue lasix 40 mg daily.  - Continue digoxin 0.125 daily.  - Continue Toprol XL 25 daily. Consider increasing dose at next appt if HR and BP allow. - Continue spironolactone 25 mg daily.  - Continue Dapagliflozin 10 mg daily.   -Continueivabradine with mild sinus tachycardia, would not push beta blocker further with soft BP. Prior authorization approved.  - Cardiac MRI- EF 17% RV 20%. Possible prior myocarditis and scar noted.--> Continue Life Vest for 3 months. No work until seen back by provider. - BMET, dig level today; repeat in 7-10 days.  2. ETOH: No longer drinks. Does not seem to have been heavy enough drinker to cause CMP, but he did have early cirrhosis on abdominal US at [facility]. However, with obesity could be NASH.  - LFTs 11/21 ok. - Encouraged abstinence from ETOH. - No complaints of abdominal pain today.  3. Suspect OSA - Wife says he snores. - Schedule sleep study.  4. Obesity Body mass index is 41.71 kg/m.  - encouraged dietary and lifestyle changes to aid in weight loss.  Follow up in 3 weeks with PharmD for medication titration and 6 weeks in APP clinic.  Anderson [place], FNP-BC 06/21/20 2:42 PM

## 2020-06-21 ENCOUNTER — Ambulatory Visit (HOSPITAL_COMMUNITY)
Admit: 2020-06-21 | Discharge: 2020-06-21 | Disposition: A | Discharge status: Home / Self Care | Payer: No Typology Code available for payment source | Source: Ambulatory Visit | Attending: Internal Medicine | Admitting: Internal Medicine

## 2020-06-21 ENCOUNTER — Other Ambulatory Visit: Payer: Self-pay

## 2020-06-21 ENCOUNTER — Encounter (HOSPITAL_COMMUNITY): Payer: Self-pay

## 2020-06-21 ENCOUNTER — Encounter (HOSPITAL_COMMUNITY): Payer: Self-pay | Admitting: Cardiology

## 2020-06-21 VITALS — BP 109/78 | HR 83 | Wt 258.4 lb

## 2020-06-21 DIAGNOSIS — I5022 Chronic systolic (congestive) heart failure: Secondary | ICD-10-CM | POA: Insufficient documentation

## 2020-06-21 DIAGNOSIS — K7460 Unspecified cirrhosis of liver: Secondary | ICD-10-CM | POA: Diagnosis not present

## 2020-06-21 DIAGNOSIS — Z7901 Long term (current) use of anticoagulants: Secondary | ICD-10-CM | POA: Insufficient documentation

## 2020-06-21 DIAGNOSIS — I428 Other cardiomyopathies: Secondary | ICD-10-CM | POA: Insufficient documentation

## 2020-06-21 DIAGNOSIS — Z87891 Personal history of nicotine dependence: Secondary | ICD-10-CM | POA: Diagnosis not present

## 2020-06-21 DIAGNOSIS — Z6841 Body Mass Index (BMI) 40.0 and over, adult: Secondary | ICD-10-CM | POA: Insufficient documentation

## 2020-06-21 DIAGNOSIS — E669 Obesity, unspecified: Secondary | ICD-10-CM | POA: Diagnosis not present

## 2020-06-21 DIAGNOSIS — Z7289 Other problems related to lifestyle: Secondary | ICD-10-CM | POA: Diagnosis not present

## 2020-06-21 DIAGNOSIS — Z789 Other specified health status: Secondary | ICD-10-CM

## 2020-06-21 DIAGNOSIS — Z79899 Other long term (current) drug therapy: Secondary | ICD-10-CM | POA: Insufficient documentation

## 2020-06-21 DIAGNOSIS — L905 Scar conditions and fibrosis of skin: Secondary | ICD-10-CM | POA: Insufficient documentation

## 2020-06-21 DIAGNOSIS — R0683 Snoring: Secondary | ICD-10-CM | POA: Insufficient documentation

## 2020-06-21 LAB — BASIC METABOLIC PANEL
Anion gap: 10 (ref 5–15)
BUN: 10 mg/dL (ref 6–20)
CO2: 23 mmol/L (ref 22–32)
Calcium: 9.1 mg/dL (ref 8.9–10.3)
Chloride: 99 mmol/L (ref 98–111)
Creatinine, Ser: 0.84 mg/dL (ref 0.61–1.24)
GFR, Estimated: >60 mL/min (ref >60)
Glucose, Bld: 115 mg/dL — ABNORMAL HIGH (ref 70–99)
Potassium: 3.9 mmol/L (ref 3.5–5.1)
Sodium: 132 mmol/L — ABNORMAL LOW (ref 135–145)

## 2020-06-21 LAB — DIGOXIN LEVEL: Digoxin Level: 0.5 ng/mL — ABNORMAL LOW (ref 0.8–2.0)

## 2020-06-21 MED ORDER — ENTRESTO 49-51 MG PO TABS: 1.0000 | ORAL_TABLET | Freq: Two times a day (BID) | ORAL | 6 refills | Status: DC

## 2020-06-21 NOTE — Patient Instructions (Signed)
INCREASE Entresto to 49/51 mg, one tab twice a day  Labs today We will only contact you if something comes back abnormal or we need to make some changes. Otherwise no news is good news!  Labs needed in 7-10 days  Your physician has recommended that you have a sleep study. This test records several body functions during sleep, including: brain activity, eye movement, oxygen and carbon dioxide blood levels, heart rate and rhythm, breathing rate and rhythm, the flow of air through your mouth and nose, snoring, body muscle movements, and chest and belly movement.  Your physician recommends that you schedule a follow-up appointment in: 3 weeks  With the pharmacy team  Your physician recommends that you schedule a follow-up appointment in: 6 weeks  in the Advanced Practitioners (PA/NP) Clinic   If you have any questions or concerns before your next appointment please send [place] a message through [place] or call our office at [phone].    TO LEAVE A MESSAGE FOR THE NURSE SELECT OPTION 2, PLEASE LEAVE A MESSAGE INCLUDING:  YOUR NAME  DATE OF BIRTH  CALL BACK NUMBER  REASON FOR CALL**this is important as we prioritize the call backs  YOU WILL RECEIVE A CALL BACK THE SAME DAY AS LONG AS YOU CALL BEFORE 4:00 PM

## 2020-06-21 NOTE — Progress Notes (Signed)
Patient Name: Kyle Pearson        DOB: [birth year]      Height: 258    Weight: 5'6"  Office Name:Advanced Heart Failure Clinic         Referring Provider:Jessica Port Dickinson, NP/ Marca Ancona, MD  Today's Date:06/21/20     STOP BANG RISK ASSESSMENT S (snore) Have you been told that you snore?     YES   T (tired) Are you often tired, fatigued, or sleepy during the day?   NO  O (obstruction) Do you stop breathing, choke, or gasp during sleep? NO   P (pressure) Do you have or are you being treated for high blood pressure? YES   B (BMI) Is your body index greater than 35 kg/m? YES   A (age) Are you 6 years old or older? NO   N (neck) Do you have a neck circumference greater than 16 inches?   NO   G (gender) Are you a male? YES   TOTAL STOP/BANG "YES" ANSWERS                                                                        For Office Use Only              Procedure Order Form    YES to 3+ Stop Bang questions OR two clinical symptoms - patient qualifies for WatchPAT (CPT 95800)     Submit: This Form + Patient Face Sheet + Clinical Note via CloudPAT or Fax: [phone]         Clinical Notes: Will consult Sleep Specialist and refer for management of therapy due to patient increased risk of Sleep Apnea. Ordering a sleep study due to the following two clinical symptoms: Excessive daytime sleepiness G47.10Personality changes or irritability R45.4 / Loud snoring   History of high blood pressure R03.0 /    I understand that I am proceeding with a home sleep apnea test as ordered by my treating physician. I understand that untreated sleep apnea is a serious cardiovascular risk factor and it is my responsibility to perform the test and seek management for sleep apnea. I will be contacted with the results and be managed for sleep apnea by a local sleep physician. I will be receiving equipment and further instructions from [facility]. I shall promptly ship back the equipment  via the included mailing label. I understand my insurance will be billed for the test and as the patient I am responsible for any insurance related out-of-pocket costs incurred. I have been provided with written instructions and can call for additional video or telephonic instruction, with 24-hour availability of qualified personnel to answer any questions: Patient Help Desk [phone].  Patient Signature ______________________________________________________   Date______________________ Patient Telemedicine Verbal Consent

## 2020-06-28 ENCOUNTER — Telehealth (HOSPITAL_COMMUNITY): Payer: Self-pay

## 2020-06-28 ENCOUNTER — Encounter (HOSPITAL_COMMUNITY): Payer: Self-pay

## 2020-06-28 NOTE — Telephone Encounter (Signed)
Attempted to call patient in regards to Cardiac Rehab - unable to leave VM, VM full.   Mailed letter 

## 2020-06-29 ENCOUNTER — Ambulatory Visit (HOSPITAL_COMMUNITY)
Admission: RE | Admit: 2020-06-29 | Discharge: 2020-06-29 | Disposition: A | Discharge status: Home / Self Care | Payer: No Typology Code available for payment source | Source: Ambulatory Visit | Attending: Cardiology | Admitting: Cardiology

## 2020-06-29 ENCOUNTER — Other Ambulatory Visit (HOSPITAL_COMMUNITY): Payer: Self-pay | Admitting: *Deleted

## 2020-06-29 ENCOUNTER — Other Ambulatory Visit: Payer: Self-pay

## 2020-06-29 DIAGNOSIS — I5022 Chronic systolic (congestive) heart failure: Secondary | ICD-10-CM | POA: Diagnosis not present

## 2020-06-29 LAB — BASIC METABOLIC PANEL
Anion gap: 10 (ref 5–15)
BUN: 10 mg/dL (ref 6–20)
CO2: 22 mmol/L (ref 22–32)
Calcium: 9 mg/dL (ref 8.9–10.3)
Chloride: 102 mmol/L (ref 98–111)
Creatinine, Ser: 0.81 mg/dL (ref 0.61–1.24)
GFR, Estimated: >60 mL/min (ref >60)
Glucose, Bld: 109 mg/dL — ABNORMAL HIGH (ref 70–99)
Potassium: 4.2 mmol/L (ref 3.5–5.1)
Sodium: 134 mmol/L — ABNORMAL LOW (ref 135–145)

## 2020-06-29 MED ORDER — ENTRESTO 49-51 MG PO TABS
1.0000 | ORAL_TABLET | Freq: Two times a day (BID) | ORAL | 6 refills | Status: DC
Start: 2020-06-29 — End: 2020-07-31

## 2020-07-13 ENCOUNTER — Telehealth (HOSPITAL_COMMUNITY): Payer: Self-pay | Admitting: Pharmacist

## 2020-07-13 ENCOUNTER — Telehealth (HOSPITAL_COMMUNITY): Payer: Self-pay | Admitting: *Deleted

## 2020-07-13 ENCOUNTER — Telehealth (HOSPITAL_COMMUNITY): Payer: Self-pay | Admitting: Pharmacy Technician

## 2020-07-13 MED ORDER — EMPAGLIFLOZIN 10 MG PO TABS: 10.0000 mg | ORAL_TABLET | Freq: Every day | ORAL | 11 refills | Status: DC

## 2020-07-13 MED FILL — JARDIANCE 10 MG TABLET: 10 | 14 days supply | Qty: 14 | Fill #0 | Status: TO

## 2020-07-13 MED FILL — ENTRESTO 24 MG-26 MG TABLET: 24-26 | 30 days supply | Qty: 60 | Fill #1 | Status: TO

## 2020-07-13 NOTE — Telephone Encounter (Signed)
Patient Advocate Encounter   Received notification from [employer] that prior authorization for Corlanor is required.   PA submitted on CoverMyMeds Key [id number] Status is pending   Will continue to follow.   Karle Plumber, PharmD, BCPS, BCCP, CPP Heart Failure Clinic Pharmacist [phone]

## 2020-07-13 NOTE — Telephone Encounter (Signed)
Advanced Heart Failure Patient Advocate Encounter  Prior Authorizations for Entresto and Corlanor have been approved.    Effective dates: 07/13/20 through 07/13/21  Karle Plumber, PharmD, BCPS, BCCP, CPP Heart Failure Clinic Pharmacist [phone]

## 2020-07-13 NOTE — Telephone Encounter (Signed)
Medication Samples have been provided to the patient.  Drug name: Sherryll Burger       Strength: 49/51 mg        Qty: 1  LOT: [id number]  Exp.Date: 12/23  Dosing instructions: take 1 tab Twice daily   The patient has been instructed regarding the correct time, dose, and frequency of taking this medication, including desired effects and most common side effects.   Kyle Pearson 1:57 PM 07/13/2020  Medication Samples have been provided to the patient.  Drug name: Corlanor       Strength: 5 mg        Qty: 2  LOT: [id number]  Exp.Date: 7/25  Dosing instructions: take 1 tab Twice daily   The patient has been instructed regarding the correct time, dose, and frequency of taking this medication, including desired effects and most common side effects.   Kyle Pearson 1:57 PM 07/13/2020  Farxiga free-card and samples given to patient

## 2020-07-13 NOTE — Telephone Encounter (Signed)
Sent Jardiance refill to [facility]. Farxiga discontinued due to insurance preference.   Karle Plumber, PharmD, BCPS, BCCP, CPP Heart Failure Clinic Pharmacist [phone]

## 2020-07-13 NOTE — Telephone Encounter (Signed)
Patient Advocate Encounter   Received notification from [employer] that prior authorization for Sherryll Burger is required.   PA submitted on CoverMyMeds Key [id number] Status is pending   Will continue to follow.  Karle Plumber, PharmD, BCPS, BCCP, CPP Heart Failure Clinic Pharmacist [phone]

## 2020-07-13 NOTE — Telephone Encounter (Signed)
Advanced Heart Failure Patient Advocate Encounter  Prior Authorization for Sherryll Burger has been submitted and approved through [facility].    PA# [id number] Effective dates: 07/13/20 through 07/13/21  Patients co-pay is $80  Advanced Heart Failure Patient Advocate Encounter  Prior Authorization for Corlanor has been approved.    PA# [id number] Effective dates: 07/13/20 through 07/13/21  Patients co-pay is $80  Advanced Heart Failure Patient Advocate Encounter  Prior Authorization for London Pepper has been approved.    PA# [id number] Effective dates: 07/13/20 through 07/13/21  Patients co-pay is $40

## 2020-07-13 NOTE — Telephone Encounter (Signed)
Advanced Heart Failure Patient Advocate Encounter  Prior Authorization for London Pepper has been approved.    PA# [id number] Effective dates: 07/13/20 through 07/13/21  Karle Plumber, PharmD, BCPS, BCCP, CPP Heart Failure Clinic Pharmacist [phone]

## 2020-07-17 ENCOUNTER — Inpatient Hospital Stay (HOSPITAL_COMMUNITY): Admission: RE | Admit: 2020-07-17 | Payer: No Typology Code available for payment source | Source: Ambulatory Visit

## 2020-07-29 NOTE — Progress Notes (Incomplete)
***In Progress*** PCP: [facility] HF physician: Dr. Shirlee Latch  HPI:  Kyle Mcglocklin Lopezis a 45 y.o. M with a PMH of tobacco and alcohol use, recently diagnosed with heart failure and cirrhosis.   Kyle Pearson reports smoking less than 1/2 pack per day and drank about 6 pack of beer on the weekend. He denies family history of heart disease but reported his father used to drink alcohol heavily but quit as well. He states in 03/16/2020 he started to develop a cough and didn't want to mix medication with alcohol so he stopped drinking and started to take cough medication. He noticed one day a very severe pain on his left side and pain with swallowing. He was diagnosed with peritonsillar abscess and placed on clindamycin and dexamethasone. He reports feeling better throat wise but noticed increase swelling to his abdomen and difficulty breathing. 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 He continued to experience trouble breathing and walking short distances were difficulty.   He went to [facility] on 04/24/2020 for shortness of breath, abdominal distention and left sided chest pain. 2D echo showed EF 15-20% with severe LVH, BNP 562 and troponin 54. He was started on lisinopril 5 mg, metoprolol succinate 25 mg PO daily and IV furosemide. Blood pressure was low and required albumin 12.5 mg IV x 3 doses to help with BP. He also had an abdominal ultrasound which showed no significant asites but noted mild liver cirrhosis with mild gallbladder wall thickening. He was discharged home on 40 mg PO furosemide, KCL 20 mEq, lisinopril 5 mg and metoprolol succinate 25 mg.   He presented to 1st PCP appt on 11/29 found to have SOB, bilateral LEE and abdominal distention. Reporting 30-lb weight gain despite compliance with medication. He was sent to [facility], where his BNP was 10[facility] BNP 562), CXR showed cardiomegaly only.  Repeat echo revealed EF <20%, severe decrease function with global hypokinesis, grade III DD, dilated LA/RA and small pericardial effusion. He was admitted 05/29/20-06/06/20 with marked volume overload. Diuresed with IV furosemide. Cath showed elevated filling pressures and low ouput. Milrinone was added and he continued to diurese on IV furosemide. Milrinone later weaned off with stable mixed venous saturation. Started on GDMT with 4 corner stone HF meds. Had cMRI with severely reduced EF and extensive scar. Due to concern for ectopy from scar he was discharged with LifeVest. Diuresed 35 lbs. Discharge weight 255.6 lbs.  He recently returned for post hospital follow up on 06/21/20 with Kyle Rome, FNP. Overall was feeling much better. Denied SOB, dizziness, CP, or PND/Orthopnea. Walked 5-10 minutes daily. Appetite was ok. No fever or chills. Weight at home ~252 pounds. Was taking all medications. Stopped smoking, drank 1-2 glasses of wine on weekends. Was wearing LifeVest. Wife said he snores.   Today he returns to HF clinic for pharmacist medication titration. At last visit with FNP, Sherryll Burger was increased to 49/51 mg BID, .   109/78, 83, 258 lbs - don't need labs, double check furos dose Plan A: Increase metop to 25/50 pending BP Later: Increase Entresto and decr furos pend BP F/u APP 08/14/20   Overall feeling ***. Dizziness, lightheadedness, fatigue:  Chest pain or palpitations:  How is your breathing?: *** SOB: Able to complete all ADLs. Activity level ***  Weight at home pounds. Takes furosemide/torsemide/bumex *** mg *** daily.  LEE PND/Orthopnea  Appetite *** Low-salt diet:  Physical Exam Cost/affordability of meds  HF Medications: Metoprolol succinate 25 mg daily Entresto 49/51 mg BID Spironolactone 25 mg daily Jardiance 10 mg daily Digoxin 0.125 mg daily Corlanor 5 mg BID Furosemide 40 mg daily KCl 40 mEq daily  Has the patient been experiencing any side  effects to the medications prescribed?  {YES NO:22349}  Does the patient have any problems obtaining medications due to transportation or finances?   {YES GG:83662} - Has [employer]  Understanding of regimen: {excellent/good/fair/poor:19665} Understanding of indications: {excellent/good/fair/poor:19665} Potential of compliance: {excellent/good/fair/poor:19665} Patient understands to avoid NSAIDs. Patient understands to avoid decongestants.    Pertinent Lab Values: . Serum creatinine ***, BUN ***, Potassium ***, Sodium ***, BNP ***, Magnesium ***, Digoxin ***   Vital Signs: . Weight: *** (last clinic weight: ***) . Blood pressure: ***  . Heart rate: ***   Assessment/Plan: 1. Chronic systolic CHF: Nonischemic cardiomyopathy. Symptomatic since 9/21 (after episode of peri-tonsillar abscess). He was a prior drinker (none since 9/21) but does not seem to describe heavy enough ETOH to cause profound cardiomyopathy (though he did have mild cirrhosis on abdominal US at [facility]). No FH of cardiomyopathy. Born in [place], has been in [place] x many years. T cruzi testing sent at [facility]. HIV negative 9/21. Echo this admission with EF <20% with severe LV dilation, moderate RVE with severely decreased systolic function, severe biatrial enlargement, mild MR. RHC/LHC 12/2 showed no coronary disease, markedly elevated filling pressures, and low cardiac output.  - Stable NYHA II-III, volume status good today.  - Continue furosemide 40 mg daily - Continue KCl 40 mEq daily - ***Increase metoprolol succinate to 50 mg daily - Continue Entresto 49/51 mg BID - Continue spironolactone 25 mg daily - Continue Jardiance 10 mg daily - Continue digoxin 0.125 mg daily - Continue Corlanor 5 mg BID - Cardiac MRI- EF 17% RV 20%. Possible prior myocarditis and scar noted.--> Continue Life Vest for 3 months. No work until seen back by provider.  2. ETOH: No longer drinks. Does not seem to have been heavy  enough drinker to cause CMP, but he did have early cirrhosis on abdominal US at [facility]. However, with obesity could be NASH.  - LFTs 11/21 ok. - Encouraged abstinence from ETOH. - No complaints of abdominal pain today.  3. Suspect OSA - Wife says he snores. - Sleep study scheduled previously  4. Obesity Body mass index is 41.71 kg/m.  - encouraged dietary and lifestyle changes to aid in weight loss.   Karle Plumber, PharmD, BCPS, BCCP, CPP Heart Failure Clinic Pharmacist [phone]

## 2020-07-31 ENCOUNTER — Ambulatory Visit (HOSPITAL_COMMUNITY)
Admission: RE | Admit: 2020-07-31 | Discharge: 2020-07-31 | Disposition: A | Discharge status: Home / Self Care | Payer: 59 | Source: Ambulatory Visit | Attending: Cardiology | Admitting: Cardiology

## 2020-07-31 ENCOUNTER — Telehealth (HOSPITAL_COMMUNITY): Payer: Self-pay | Admitting: Pharmacist

## 2020-07-31 ENCOUNTER — Other Ambulatory Visit: Payer: Self-pay

## 2020-07-31 VITALS — BP 118/82 | HR 99 | Wt 264.0 lb

## 2020-07-31 DIAGNOSIS — K7460 Unspecified cirrhosis of liver: Secondary | ICD-10-CM | POA: Insufficient documentation

## 2020-07-31 DIAGNOSIS — R0683 Snoring: Secondary | ICD-10-CM | POA: Diagnosis not present

## 2020-07-31 DIAGNOSIS — Z79899 Other long term (current) drug therapy: Secondary | ICD-10-CM | POA: Insufficient documentation

## 2020-07-31 DIAGNOSIS — F17210 Nicotine dependence, cigarettes, uncomplicated: Secondary | ICD-10-CM | POA: Insufficient documentation

## 2020-07-31 DIAGNOSIS — I5022 Chronic systolic (congestive) heart failure: Secondary | ICD-10-CM | POA: Diagnosis present

## 2020-07-31 DIAGNOSIS — E669 Obesity, unspecified: Secondary | ICD-10-CM | POA: Insufficient documentation

## 2020-07-31 DIAGNOSIS — Z6841 Body Mass Index (BMI) 40.0 and over, adult: Secondary | ICD-10-CM | POA: Insufficient documentation

## 2020-07-31 DIAGNOSIS — I428 Other cardiomyopathies: Secondary | ICD-10-CM | POA: Diagnosis not present

## 2020-07-31 DIAGNOSIS — Z7984 Long term (current) use of oral hypoglycemic drugs: Secondary | ICD-10-CM | POA: Diagnosis not present

## 2020-07-31 MED ORDER — ENTRESTO 97-103 MG PO TABS
1.0000 | ORAL_TABLET | Freq: Two times a day (BID) | ORAL | 3 refills | Status: DC
Start: 2020-07-31 — End: 2021-01-11

## 2020-07-31 MED ORDER — IVABRADINE HCL 5 MG PO TABS
5.0000 mg | ORAL_TABLET | Freq: Two times a day (BID) | ORAL | 3 refills | Status: DC
Start: 2020-07-31 — End: 2020-08-08

## 2020-07-31 MED ORDER — EMPAGLIFLOZIN 10 MG PO TABS
10.0000 mg | ORAL_TABLET | Freq: Every day | ORAL | 3 refills | Status: DC
Start: 2020-07-31 — End: 2021-02-20

## 2020-07-31 NOTE — Telephone Encounter (Signed)
Was able to obtain a $10 Entresto co-pay card  BIN: [id number] PCN: [id number] ID:  [id number] Group:  [id number]  Jardiance co-pay card RxBIN: [id number]  RxPCN: [id number]  RxGRP: [id number]  ID: [id number]

## 2020-07-31 NOTE — Telephone Encounter (Signed)
Was able to obtain a $20 Corlanor co-pay card  BIN: [id number] PCN: [id number] ID:  [id number] Group:  [id number]  Communicated to [facility] co-pay card information for Corlanor, Entresto, and Jardiance. Patient aware of co-pays.

## 2020-07-31 NOTE — Patient Instructions (Addendum)
It was a pleasure seeing you today!  MEDICATIONS: -We are changing your medications today -Increase Entresto to 97/103 mg (1 tablet) twice daily.  -Call if you have questions about your medications.   NEXT APPOINTMENT: Return to clinic in 2 weeks with APP Clinic.  In general, to take care of your heart failure: -Limit your fluid intake to 2 Liters (half-gallon) per day.   -Limit your salt intake to ideally 2-3 grams (2000-3000 mg) per day. -Weigh yourself daily and record, and bring that "weight diary" to your next appointment.  (Weight gain of 2-3 pounds in 1 day typically means fluid weight.) -The medications for your heart are to help your heart and help you live longer.   -Please contact us before stopping any of your heart medications.  Call the clinic at [phone] with questions or to reschedule future appointments.

## 2020-07-31 NOTE — Progress Notes (Signed)
PCP: [facility] HF physician: Dr. Shirlee Latch  HPI:  Kyle Hakim Lopezis a 45 y.o. M with a PMH of tobacco and alcohol use, recently diagnosed with heart failure and cirrhosis.   Kyle Pearson reports smoking less than 1/2 pack per day and drank about 6 pack of beer on the weekend. He denies family history of heart disease but reported his father used to drink alcohol heavily but quit as well. He states in 03/16/2020 he started to develop a cough and didn't want to mix medication with alcohol so he stopped drinking and started to take cough medication. He noticed one day a very severe pain on his left side and pain with swallowing. He was diagnosed with peritonsillar abscess and placed on clindamycin and dexamethasone. He reports feeling better throat wise but noticed increase swelling to his abdomen and difficulty breathing. 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 He continued to experience trouble breathing and walking short distances were difficulty.  He went to [facility] on 04/24/2020 for shortness of breath, abdominal distention and left sided chest pain. 2D echo showed EF 15-20% with severe LVH, BNP 562 and troponin 54. He was started on lisinopril 5 mg, metoprolol succinate 25 mg PO daily and IV furosemide. Blood pressure was low and required albumin 12.5 mg IV x 3 doses to help with BP. He also had an abdominal ultrasound which showed no significant asites but noted mild liver cirrhosis with mild gallbladder wall thickening. He was discharged home on 40 mg PO furosemide, KCL 20 mEq, lisinopril 5 mg and metoprolol succinate 25 mg.   He presented to 1st PCP appt on 11/29 found to have SOB, bilateral LEE and abdominal distention. Reporting 30-lb weight gain despite compliance with medication. He was sent to [facility], where his BNP was [facility] BNP 562), CXR showed cardiomegaly only. Repeat echo revealed EF  <20%, severe decrease function with global hypokinesis, grade III DD, dilated LA/RA and small pericardial effusion. He was admitted 05/29/20-06/06/20 with marked volume overload. Diuresed with IV furosemide. Cath showed elevated filling pressures and low ouput. Milrinone was added and he continued to diurese on IV furosemide. Milrinone later weaned off with stable mixed venous saturation. Started on GDMT with 4 corner stone HF meds. Had cMRI with severely reduced EF and extensive scar. Due to concern for ectopy from scar he was discharged with LifeVest. Diuresed 35 lbs. Discharge weight 255.6 lbs.  He recently returned for post hospital follow up on 06/21/20 with [name], FNP. Overall was feeling much better. Denied SOB, dizziness, CP, or PND/Orthopnea. Walked 5-10 minutes daily. Appetite was ok. No fever or chills. Weight at home ~252 pounds. Was taking all medications. Stopped smoking, drank 1-2 glasses of wine on weekends. Was wearing LifeVest. Wife said he snores.   Today he returns to HF clinic for pharmacist medication titration. At last visit with FNP, Sherryll Burger was increased to 49/51 mg BID. Overall, he is feeling well and symptoms improved. He is now able to carry and take care of his baby which he was not able to do before. He complains of medication affordability issues. Able to get him copay cards for Jardiance, Corlanor and Jasper, so this is now resolved. He ran out of Corlanor last night and did not take it this morning.  Denies dizziness, lightheadedness, or fatigue. No chest pain or palpitations. His breathing is improved. He is not able to lie flat  on his back without getting SOB and uses 2-3 pillows at night, but this has improved as well. Weight up 6 lbs since last clinic visit. 1+ bilateral LEE. He tries to avoid salt in diet but has been drinking more water and tea recently.  HF Medications: Metoprolol succinate 25 mg daily Entresto 49/51 mg BID Spironolactone 25 mg  daily Jardiance 10 mg daily Digoxin 0.125 mg daily Corlanor 5 mg BID Furosemide 40 mg daily KCl 40 mEq daily  Has the patient been experiencing any side effects to the medications prescribed?  no  Does the patient have any problems obtaining medications due to transportation or finances?   Has [employer]. Was able to obtain copay cards for Valla Leaver, and Corlanor. Communicated information to pharmacy and copays will be $10 for Entresto (90 days), $10 for Jardiance (90 days), and $20 for Corlanor (30 days)  Understanding of regimen: fair Understanding of indications: fair Potential of compliance: fair Patient understands to avoid NSAIDs. Patient understands to avoid decongestants.    Pertinent Lab Values (06/29/20): Marland Kitchen Serum creatinine 0.81, BUN 10, Potassium 4.2, Sodium 134  Vital Signs: . Weight: 264 lbs (last clinic weight: 258 lbs) . Blood pressure: 118/82  . Heart rate: 99   Assessment/Plan: 1. Chronic systolic CHF: Nonischemic cardiomyopathy. Symptomatic since 9/21 (after episode of peri-tonsillar abscess). He was a prior drinker (none since 9/21) but does not seem to describe heavy enough ETOH to cause profound cardiomyopathy (though he did have mild cirrhosis on abdominal US at [facility]). No FH of cardiomyopathy. Born in [place], has been in [place] x many years. T cruzi testing sent at [facility]. HIV negative 9/21. Echo this admission with EF <20% with severe LV dilation, moderate RVE with severely decreased systolic function, severe biatrial enlargement, mild MR. RHC/LHC 12/2 showed no coronary disease, markedly elevated filling pressures, and low cardiac output.  - Stable NYHA II-III, volume status up today with 1+ pitting edema. - Continue furosemide 40 mg daily - Continue KCl 40 mEq daily - Continue metoprolol succinate 25 mg daily - Increase Entresto to 97/103 mg BID. Hopefully this will help with excess volume. Repeat BMET in 2 weeks.  -  Continue spironolactone 25 mg daily - Continue Jardiance 10 mg daily. Changed from [place] due to insurance coverage.  - Continue digoxin 0.125 mg daily - Continue Corlanor 5 mg BID - Cardiac MRI- EF 17% RV 20%. Possible prior myocarditis and scar noted.Plans noted to continue Life Vest for 3 months. No work until seen back by provider.  2. ETOH: No longer drinks. Does not seem to have been heavy enough drinker to cause CMP, but he did have early cirrhosis on abdominal US at [facility]. However, with obesity could be NASH.  - LFTs 11/21 ok. - Encouraged abstinence from ETOH. - No complaints of abdominal pain today.  3. Suspect OSA - Wife says he snores. - Sleep study scheduled previously  4. Obesity Body mass index is 41.71 kg/m.  - encouraged dietary and lifestyle changes to aid in weight loss.  Tama Headings, PharmD, BCPS PGY2 Cardiology Pharmacy Resident  Karle Plumber, PharmD, BCPS, [facility], CPP Heart Failure Clinic Pharmacist [phone]

## 2020-08-08 ENCOUNTER — Telehealth (HOSPITAL_COMMUNITY): Payer: Self-pay

## 2020-08-08 ENCOUNTER — Telehealth (HOSPITAL_COMMUNITY): Payer: Self-pay | Admitting: Cardiology

## 2020-08-08 MED ORDER — POTASSIUM CHLORIDE CRYS ER 20 MEQ PO TBCR: 40.0000 meq | EXTENDED_RELEASE_TABLET | Freq: Every day | ORAL | 6 refills | Status: DC

## 2020-08-08 MED ORDER — SPIRONOLACTONE 25 MG PO TABS: 25.0000 mg | ORAL_TABLET | Freq: Every day | ORAL | 6 refills | Status: DC

## 2020-08-08 MED ORDER — THIAMINE HCL 100 MG PO TABS: 100.0000 mg | ORAL_TABLET | Freq: Every day | ORAL | 1 refills | Status: DC

## 2020-08-08 MED ORDER — DIGOXIN 125 MCG PO TABS: 0.1250 mg | ORAL_TABLET | Freq: Every day | ORAL | 6 refills | Status: DC

## 2020-08-08 MED ORDER — METOPROLOL SUCCINATE ER 25 MG PO TB24
25.0000 mg | ORAL_TABLET | Freq: Every day | ORAL | 3 refills | Status: DC
Start: 2020-08-08 — End: 2020-09-27

## 2020-08-08 MED ORDER — IVABRADINE HCL 5 MG PO TABS
5.0000 mg | ORAL_TABLET | Freq: Two times a day (BID) | ORAL | 3 refills | Status: DC
Start: 2020-08-08 — End: 2022-03-15

## 2020-08-08 NOTE — Telephone Encounter (Signed)
Guardian disability forms filled out and faxed to [phone]. Tried calling patient to inform him but no answer,lmtrc. A copy will be scanned into patients chart,orginial will be left up front for patient pick up

## 2020-08-08 NOTE — Telephone Encounter (Signed)
Patient and RN with [facility] called with multiple medication concerns Reports majority of medications are almost out Refills returned to pharmacy  Advised thiamine will be refilled as a courtesy additional refills should come from PCP

## 2020-08-13 NOTE — Progress Notes (Signed)
Advanced Heart Failure Clinic Note  PCP: [facility] HF physician: Dr. Shirlee Latch  HPI: Kyle Pearson is a 45 y.o. M PMH: tobacco and alcohol use, recent diagnosed with heart failure and cirrhosis.   Kyle Pearson reports no past medical history used to smoke less than 1/2 pack per day and drank about 6 pack of beer on the weekend.  He denies family history of heart disease but reported his father used to drink alcohol heavily but quit as well.  He states in 03/16/2020 he started to develop a cough and didn't want to mix medication with alcohol so he stopped drinking and started to take cough medication.  He noticed one day a very severe pain on his left side and pain with swallowing.  He was diagnosed with peritonsillar abscess and placed on clindamycin (300 mg po QID x 10 days) and decadron.  He reports feeling better throat wise but noticed increase swelling to his abdomen and difficulty breathing.  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  He continued to experience trouble breathing and walking short distances were difficulty.    He went to [facility] on 04/24/2020 for shortness of breath, abdominal distention and left sided chest pain. 2D echo showed EF 15-20% with severe LVH, BNP 562 and troponin 54.  He was started on lisinopril 5 mg, toprol-XL 25 mg PO daily and lasix IV. Blood pressure was low and required albumin 12.5 mg IV x 3 doses to help with BP.  He also had an abdominal ultrasound which showed no significant asites but note mild liver cirrhosis with mild gallbladder wall thickening. He was discharge home on 40 mg PO lasix, KCL 20 mEq, lisinopril 5 and toprol-XL 25.    He presented to 1st PCP appt on 11/29 found to have shortness of breath, bilaterally lower extremity edema and abdominal distention.  Reporting 30lb weight gain despite compliance with medication.  He was sent to [facility] , where his BNP was [address]  [facility] BNP 562), CXR showed cardiomegaly only.  Repeat echo revealed EF < 20%, severe decrease function with global hypokinesis, grade III DD, dilated LA/RA and small pericardial effusion. He was admitted 05/29/20-06/06/20 with marked volume overload. Diuresed with IV lasix and set for cath. Cath showed elevated filling pressures and low ouput. Milrinone was added and he continued to diurese on IV. Milrinone later weaned off with stable mixed venous saturation. Started on GDMT with 4 corner stone HF meds. Had cMRI with severely reduced EF and extensive scar. Due to concern for ectopy from scar he was discharged with Life Vest.  Diuresed 35 lbs. Discharge weight 255.6 lbs.  He returned for post hospital follow up 12/21. Overall feeling much better. Denies SOB, dizziness, CP, and PND/Orthopnea. Walking 5-10 minutes a day. Appetite ok. No fever or chills. Weight at home ~252 pounds. Taking all medications. Stopped smoking, drinks1-2 glasses wine on weekends. 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 Wearing LifeVest. Wife says he snores.   Today he returns for HF follow up. Overall feeling fine. Fatigued with walking more than 20 minutes.Denies increasing SOB, CP, dizziness, edema, or PND/Orthopnea. Appetite ok. No fever or chills. Weight at home ~252-255 pounds. Taking all medications. Drinking 4 beers/day on weekends. Scheduled for renal U/S next week at [facility]. Wearing LifeVest.  Cardiac Studies:  RHC/LHC: 06/01/20 showed no coronary disease, markedly elevated filling pressures, and low cardiac output.  Echo: 05/30/20 with EF <20% with severe LV dilation, moderate RVE with severely decreased systolic function, severe biatrial enlargement, mild MR. cMRI: 06/05/20 Severely dilated LV with diffuse hypokinesis, EF 17%., mod dilated RV with EF 20%, diffuse mid-wall LGE in septum, inferior wall & inferolateral  wall  ROS: All systems negative except as listed in HPI, PMH and Problem List.  SH:  Social History   Socioeconomic History   Marital status: Single    Spouse name: Not on file   Number of children: Not on file   Years of education: Not on file   Highest education level: Not on file  Occupational History   Not on file  Tobacco Use   Smoking status: Former Smoker    Quit date: 2020    Years since quitting: 2.1   Smokeless tobacco: Never Used  Building services engineer Use: Never used  Substance and Sexual Activity   Alcohol use: Not Currently   Drug use: Never   Sexual activity: Not on file  Other Topics Concern   Not on file  Social History Narrative   Not on file   Social Determinants of Health   Financial Resource Strain: Not on file  Food Insecurity: Not on file  Transportation Needs: Not on file  Physical Activity: Not on file  Stress: Not on file  Social Connections: Not on file  Intimate Partner Violence: Not on file    FH: No family history on file.  No past medical history on file.  Current Outpatient Medications  Medication Sig Dispense Refill   bisacodyl (DULCOLAX) 5 MG EC tablet Take 10 mg by mouth as needed for mild constipation.      digoxin (LANOXIN) 0.125 MG tablet Take 1 tablet (0.125 mg total) by mouth daily. 30 tablet 6   empagliflozin (JARDIANCE) 10 MG TABS tablet Take 1 tablet (10 mg total) by mouth daily before breakfast. 90 tablet 3   furosemide (LASIX) 40 MG tablet Take 40 mg by mouth daily.     ivabradine (CORLANOR) 5 MG TABS tablet Take 1 tablet (5 mg total) by mouth 2 (two) times daily with a meal. 180 tablet 3   metoprolol succinate (TOPROL-XL) 25 MG 24 hr tablet Take 1 tablet (25 mg total) by mouth daily. 30 tablet 3   Multiple Vitamin (MULTI-VITAMIN) tablet Take 1 tablet by mouth daily.     potassium chloride SA (KLOR-CON) 20 MEQ tablet Take 20 mEq by mouth once.     sacubitril-valsartan (ENTRESTO) 97-103 MG Take 1  tablet by mouth 2 (two) times daily. 180 tablet 3   spironolactone (ALDACTONE) 25 MG tablet Take 1 tablet (25 mg total) by mouth daily. 30 tablet 6   thiamine 100 MG tablet Take 1 tablet (100 mg total) by mouth daily. 30 tablet 1   No current facility-administered medications for this encounter.   Vitals:   08/14/20 1337  BP: 100/80  Pulse: 80  SpO2: 98%  Weight: 121.3 kg     Wt Readings from Last 3 Encounters:  08/14/20 121.3 kg  07/31/20 119.7 kg  06/21/20 117.2 kg   LifeVest: no events, average daily use 12 hours, wearing 50% of the time, average daily steps 1653.  PHYSICAL EXAM: General:  NAD. No resp difficulty HEENT: Normal Neck: Supple. No JVD. Carotids 2+ bilat; no bruits. No lymphadenopathy or thryomegaly appreciated. Cor: PMI nondisplaced. Regular rate & rhythm. No rubs,  gallops or murmurs. Lungs: Clear Abdomen: Soft, nontender, nondistended. No hepatosplenomegaly. No bruits or masses. Good bowel sounds. Extremities: No cyanosis, clubbing, rash, edema Neuro: Aert & oriented x 3, cranial nerves grossly intact. Moves all 4 extremities w/o difficulty. Affect pleasant.  ASSESSMENT & PLAN: 1. Chronic systolic CHF: Nonischemic cardiomyopathy. Symptomatic since 9/21 (after episode of peri-tonsillar abscess). He was a prior drinker (none since 9/21) but does not seem to describe heavy enough ETOH to cause profound cardiomyopathy (though he did have mild cirrhosis on abdominal US at [facility]). No FH of cardiomyopathy. Born in [place], has been in [place] x many years. T cruzi testing sent at [facility]. HIV negative 9/21. Echo this admission with EF <20% with severe LV dilation, moderate RVE with severely decreased systolic function, severe biatrial enlargement, mild MR. RHC/LHC 12/2 showed no coronary disease, markedly elevated filling pressures, and low cardiac output.  - Stable NYHA II, functional status confounded by body habitus and general physical inactivity, volume status  good today.  - Continue Entresto to 97/103 mg bid.  -Continue lasix 40 mg daily.  - Continue digoxin 0.125 daily.  - Continue Toprol XL 25 daily. No BP room to increase today. - Continue spironolactone 25 mg daily.  - Continue Dapagliflozin 10 mg daily.  -Continueivabradine with mild sinus tachycardia, would not push beta blocker further with soft BP. Prior authorization approved.  - Cardiac MRI- EF 17% RV 20%. Possible prior myocarditis and scar noted.--> Continue Life Vest for 3 months. No work until seen back by provider. - BMET, dig level today. - Repeat Echo next month. 2. ETOH: Cut back on ETOH but still drinking on weekends. He did have early cirrhosis on abdominal US at [facility]. However, with obesity could be NASH.  - LFTs 11/21 ok. - Encouraged abstinence from ETOH. 3. Suspect OSA - Wife says he snores. - Sleep study ordered. 4. Obesity Body mass index is 43.16 kg/m.  - Encouraged dietary and lifestyle changes to aid in weight loss.  Follow up next month with Dr. Shirlee Latch w/ echo.  Anderson Malta [place], FNP-BC 08/14/20 1:50 PM

## 2020-08-14 ENCOUNTER — Other Ambulatory Visit: Payer: Self-pay

## 2020-08-14 ENCOUNTER — Ambulatory Visit (HOSPITAL_COMMUNITY)
Admission: RE | Admit: 2020-08-14 | Discharge: 2020-08-14 | Disposition: A | Discharge status: Home / Self Care | Payer: 59 | Source: Ambulatory Visit | Attending: Family Medicine | Admitting: Family Medicine

## 2020-08-14 ENCOUNTER — Encounter (HOSPITAL_COMMUNITY): Payer: Self-pay

## 2020-08-14 ENCOUNTER — Encounter (HOSPITAL_COMMUNITY): Payer: Self-pay | Admitting: Cardiology

## 2020-08-14 VITALS — BP 100/80 | HR 80 | Wt 267.4 lb

## 2020-08-14 DIAGNOSIS — Z7289 Other problems related to lifestyle: Secondary | ICD-10-CM | POA: Diagnosis not present

## 2020-08-14 DIAGNOSIS — Z6841 Body Mass Index (BMI) 40.0 and over, adult: Secondary | ICD-10-CM | POA: Diagnosis not present

## 2020-08-14 DIAGNOSIS — I428 Other cardiomyopathies: Secondary | ICD-10-CM | POA: Insufficient documentation

## 2020-08-14 DIAGNOSIS — K7460 Unspecified cirrhosis of liver: Secondary | ICD-10-CM | POA: Diagnosis not present

## 2020-08-14 DIAGNOSIS — Z789 Other specified health status: Secondary | ICD-10-CM

## 2020-08-14 DIAGNOSIS — I509 Heart failure, unspecified: Secondary | ICD-10-CM

## 2020-08-14 DIAGNOSIS — R0683 Snoring: Secondary | ICD-10-CM | POA: Insufficient documentation

## 2020-08-14 DIAGNOSIS — I5022 Chronic systolic (congestive) heart failure: Secondary | ICD-10-CM | POA: Insufficient documentation

## 2020-08-14 DIAGNOSIS — Z87891 Personal history of nicotine dependence: Secondary | ICD-10-CM | POA: Diagnosis not present

## 2020-08-14 DIAGNOSIS — E669 Obesity, unspecified: Secondary | ICD-10-CM | POA: Diagnosis not present

## 2020-08-14 DIAGNOSIS — Z79899 Other long term (current) drug therapy: Secondary | ICD-10-CM | POA: Diagnosis not present

## 2020-08-14 DIAGNOSIS — R000 Tachycardia, unspecified: Secondary | ICD-10-CM | POA: Diagnosis not present

## 2020-08-14 LAB — DIGOXIN LEVEL: Digoxin Level: 0.3 ng/mL — ABNORMAL LOW (ref 0.8–2.0)

## 2020-08-14 LAB — BASIC METABOLIC PANEL
Anion gap: 7 (ref 5–15)
BUN: 10 mg/dL (ref 6–20)
CO2: 25 mmol/L (ref 22–32)
Calcium: 8.9 mg/dL (ref 8.9–10.3)
Chloride: 105 mmol/L (ref 98–111)
Creatinine, Ser: 0.85 mg/dL (ref 0.61–1.24)
GFR, Estimated: >60 mL/min (ref >60)
Glucose, Bld: 113 mg/dL — ABNORMAL HIGH (ref 70–99)
Potassium: 4.3 mmol/L (ref 3.5–5.1)
Sodium: 137 mmol/L (ref 135–145)

## 2020-08-14 NOTE — Patient Instructions (Signed)
It was great to see you today! No medication changes are needed at this time.  Labs today We will only contact you if something comes back abnormal or we need to make some changes. Otherwise no news is good news!  Your physician recommends that you schedule a follow-up appointment in: 4 weeks with Dr Shirlee Latch and echo  Your physician has requested that you have an echocardiogram. Echocardiography is a painless test that uses sound waves to create images of your heart. It provides your doctor with information about the size and shape of your heart and how well your heart's chambers and valves are working. This procedure takes approximately one hour. There are no restrictions for this procedure.   At the Advanced Heart Failure Clinic, you and your health needs are our priority. As part of our continuing mission to provide you with exceptional heart care, we have created designated Provider Care Teams. These Care Teams include your primary Cardiologist (physician) and Advanced Practice Providers (APPs- Physician Assistants and Nurse Practitioners) who all work together to provide you with the care you need, when you need it.   You may see any of the following providers on your designated Care Team at your next follow up: Marland Kitchen Dr Arvilla Meres . Dr Marca Ancona . Tonye Becket, NP . Robbie Lis, PA . Shanda Bumps [place],NP . Karle Plumber, PharmD   Please be sure to bring in all your medications bottles to every appointment.     If you have any questions or concerns before your next appointment please send [place] a message through [place] or call our office at [phone].    TO LEAVE A MESSAGE FOR THE NURSE SELECT OPTION 2, PLEASE LEAVE A MESSAGE INCLUDING: . YOUR NAME . DATE OF BIRTH . CALL BACK NUMBER . REASON FOR CALL**this is important as we prioritize the call backs  YOU WILL RECEIVE A CALL BACK THE SAME DAY AS LONG AS YOU CALL BEFORE 4:00 PM

## 2020-08-19 ENCOUNTER — Other Ambulatory Visit: Payer: Self-pay

## 2020-08-19 ENCOUNTER — Ambulatory Visit (HOSPITAL_BASED_OUTPATIENT_CLINIC_OR_DEPARTMENT_OTHER): Payer: 59 | Attending: Family Medicine | Admitting: Cardiology

## 2020-08-19 DIAGNOSIS — R0683 Snoring: Secondary | ICD-10-CM | POA: Diagnosis present

## 2020-08-19 DIAGNOSIS — R0902 Hypoxemia: Secondary | ICD-10-CM | POA: Diagnosis not present

## 2020-08-19 DIAGNOSIS — G4733 Obstructive sleep apnea (adult) (pediatric): Secondary | ICD-10-CM | POA: Insufficient documentation

## 2020-08-19 DIAGNOSIS — G4736 Sleep related hypoventilation in conditions classified elsewhere: Secondary | ICD-10-CM | POA: Insufficient documentation

## 2020-08-22 NOTE — Procedures (Signed)
   Patient Name: Kyle Pearson, Kyle Pearson Date: 08/19/2020 Gender: Male D.O.B: January 08, 1976 Age (years): 39 Referring Provider: [name] FNP Height (inches): 71 Interpreting Physician: Armanda Magic MD, ABSM Weight (lbs): 260 RPSGT: Lowry Ram BMI: 36 MRN: [id number] Neck Size: 17.00  CLINICAL INFORMATION Sleep Study Type: NPSG  Indication for sleep study: Congestive Heart Failure, Daytime Fatigue, Fatigue, Morbid Obesity, Snoring, Witnesses Apnea / Gasping During Sleep  Epworth Sleepiness Score: 5  SLEEP STUDY TECHNIQUE As per the AASM Manual for the Scoring of Sleep and Associated Events v2.3 (April 2016) with a hypopnea requiring 4% desaturations.  The channels recorded and monitored were frontal, central and occipital EEG, electrooculogram (EOG), submentalis EMG (chin), nasal and oral airflow, thoracic and abdominal wall motion, anterior tibialis EMG, snore microphone, electrocardiogram, and pulse oximetry.  MEDICATIONS Medications self-administered by patient taken the night of the study : N/A  SLEEP ARCHITECTURE The study was initiated at 10:43:20 PM and ended at 5:48:07 AM.  Sleep onset time was 15.1 minutes and the sleep efficiency was 74.5%. The total sleep time was 316.5 minutes.  Stage REM latency was 128.5 minutes.  The patient spent 20.7% of the night in stage N1 sleep, 54.7% in stage N2 sleep, 0.0% in stage N3 and 24.6% in REM.  Alpha intrusion was absent.  Supine sleep was 57.96%.  RESPIRATORY PARAMETERS The overall apnea/hypopnea index (AHI) was 31.1 per hour. There were 2 total apneas, including 2 obstructive, 0 central and 0 mixed apneas. There were 162 hypopneas and 64 RERAs.  The AHI during Stage REM sleep was 40.0 per hour.  AHI while supine was 23.6 per hour.  The mean oxygen saturation was 94.0%. The minimum SpO2 during sleep was 75.0%.  loud snoring was noted during this study.  CARDIAC DATA The 2 lead EKG demonstrated sinus  rhythm. The mean heart rate was 79.4 beats per minute. Other EKG findings include: PVCs.  LEG MOVEMENT DATA The total PLMS were 0 with a resulting PLMS index of 0.0. Associated arousal with leg movement index was 0.8 .  IMPRESSIONS - Moderate obstructive sleep apnea occurred during this study (AHI = 31.1/h). - No significant central sleep apnea occurred during this study (CAI = 0.0/h). - Moderate oxygen desaturation was noted during this study (Min O2 = 75.0%). - The patient snored with loud snoring volume. - EKG findings include PVCs. - Clinically significant periodic limb movements did not occur during sleep. No significant associated arousals.  DIAGNOSIS - Obstructive Sleep Apnea (G47.33) - Nocturnal Hypoxemia (G47.36)  RECOMMENDATIONS - Therapeutic CPAP titration to determine optimal pressure required to alleviate sleep disordered breathing. - Avoid alcohol, sedatives and other CNS depressants that may worsen sleep apnea and disrupt normal sleep architecture. - Sleep hygiene should be reviewed to assess factors that may improve sleep quality. - Weight management and regular exercise should be initiated or continued if appropriate.  [Electronically signed] 08/22/2020 12:30 PM  Armanda Magic MD, ABSM Diplomate, American Board of Sleep Medicine

## 2020-08-30 ENCOUNTER — Telehealth (HOSPITAL_COMMUNITY): Payer: Self-pay

## 2020-08-30 NOTE — Telephone Encounter (Signed)
Guardian ST[employer] disability forms filled out and faxed to [phone]. Patient will pick up originals when he comes in for his appointment with Dr. Shirlee Latch 09/27/20

## 2020-09-04 ENCOUNTER — Other Ambulatory Visit (HOSPITAL_COMMUNITY): Payer: Self-pay | Admitting: Family Medicine

## 2020-09-04 ENCOUNTER — Telehealth: Payer: Self-pay | Admitting: *Deleted

## 2020-09-04 NOTE — Telephone Encounter (Signed)
-----   Message from Quintella Reichert, MD sent at 08/22/2020 12:32 PM EST ----- Please let patient know that they have sleep apnea and recommend CPAP titration. Please set up titration in the sleep lab.

## 2020-09-04 NOTE — Telephone Encounter (Signed)
Informed patient of sleep study results and patient understanding was verbalized. Patient understands his sleep study showed they have sleep apnea and recommend CPAP titration. Please set up titration in the sleep lab.   Left detailed message on voicemail and informed patient to call back with questions   cpap titration ordered

## 2020-09-05 NOTE — Telephone Encounter (Signed)
Pt is returning call.  

## 2020-09-12 ENCOUNTER — Telehealth: Payer: Self-pay | Admitting: *Deleted

## 2020-09-12 NOTE — Telephone Encounter (Signed)
Staff message sent to [place] ok to schedule CPAP titration. PA received from both [facility] and  [address] [place]. [employer] Auth # [id number]. Valid dates 09/05/20 to 11/03/20. [employer] Auth # [id number].

## 2020-09-14 ENCOUNTER — Telehealth: Payer: Self-pay | Admitting: *Deleted

## 2020-09-14 DIAGNOSIS — R0683 Snoring: Secondary | ICD-10-CM

## 2020-09-14 DIAGNOSIS — G4733 Obstructive sleep apnea (adult) (pediatric): Secondary | ICD-10-CM

## 2020-09-14 NOTE — Telephone Encounter (Signed)
-----   Message from Gaynelle Cage, CMA sent at 09/12/2020  1:46 PM EDT ----- Regarding: RE: precert Ok to schedule CPAP titration. PA received from [facility] and [employer] ----- Message ----- From: Reesa Chew, CMA Sent: 09/04/2020   6:16 PM EDT To: [facility] Sleep Studies Subject: precert                                         recommend CPAP titration.

## 2020-09-14 NOTE — Telephone Encounter (Signed)
Patient is scheduled for CPAP Titration on 11/14/20. Patient understands his titration study will be done at [facility] sleep lab. Patient understands he will receive a letter in a week or so detailing appointment, date, time, and location. Patient understands to call if he does not receive the letter  in a timely manner. Left detailed message on voicemail and informed patient to call back with questions.

## 2020-09-27 ENCOUNTER — Ambulatory Visit (HOSPITAL_BASED_OUTPATIENT_CLINIC_OR_DEPARTMENT_OTHER)
Admission: RE | Admit: 2020-09-27 | Discharge: 2020-09-27 | Disposition: A | Discharge status: Home / Self Care | Payer: 59 | Source: Ambulatory Visit | Attending: Cardiology | Admitting: Cardiology

## 2020-09-27 ENCOUNTER — Other Ambulatory Visit: Payer: Self-pay

## 2020-09-27 ENCOUNTER — Encounter (HOSPITAL_COMMUNITY): Payer: Self-pay | Admitting: Cardiology

## 2020-09-27 ENCOUNTER — Ambulatory Visit (HOSPITAL_COMMUNITY)
Admission: RE | Admit: 2020-09-27 | Discharge: 2020-09-27 | Disposition: A | Discharge status: Home / Self Care | Payer: 59 | Source: Ambulatory Visit | Attending: Cardiology | Admitting: Cardiology

## 2020-09-27 VITALS — BP 124/82 | HR 90 | Wt 269.4 lb

## 2020-09-27 DIAGNOSIS — K7460 Unspecified cirrhosis of liver: Secondary | ICD-10-CM | POA: Diagnosis not present

## 2020-09-27 DIAGNOSIS — I428 Other cardiomyopathies: Secondary | ICD-10-CM | POA: Diagnosis not present

## 2020-09-27 DIAGNOSIS — Z87891 Personal history of nicotine dependence: Secondary | ICD-10-CM | POA: Insufficient documentation

## 2020-09-27 DIAGNOSIS — Z79899 Other long term (current) drug therapy: Secondary | ICD-10-CM | POA: Insufficient documentation

## 2020-09-27 DIAGNOSIS — I509 Heart failure, unspecified: Secondary | ICD-10-CM

## 2020-09-27 DIAGNOSIS — E669 Obesity, unspecified: Secondary | ICD-10-CM | POA: Diagnosis not present

## 2020-09-27 DIAGNOSIS — L299 Pruritus, unspecified: Secondary | ICD-10-CM

## 2020-09-27 DIAGNOSIS — I5022 Chronic systolic (congestive) heart failure: Secondary | ICD-10-CM | POA: Insufficient documentation

## 2020-09-27 DIAGNOSIS — R0683 Snoring: Secondary | ICD-10-CM | POA: Diagnosis not present

## 2020-09-27 DIAGNOSIS — G4733 Obstructive sleep apnea (adult) (pediatric): Secondary | ICD-10-CM | POA: Diagnosis not present

## 2020-09-27 DIAGNOSIS — Z9114 Patient's other noncompliance with medication regimen: Secondary | ICD-10-CM

## 2020-09-27 HISTORY — DX: Heart failure, unspecified: I50.9

## 2020-09-27 LAB — BASIC METABOLIC PANEL
Anion gap: 9 (ref 5–15)
BUN: 14 mg/dL (ref 6–20)
CO2: 24 mmol/L (ref 22–32)
Calcium: 9.4 mg/dL (ref 8.9–10.3)
Chloride: 102 mmol/L (ref 98–111)
Creatinine, Ser: 0.7 mg/dL (ref 0.61–1.24)
GFR, Estimated: >60 mL/min (ref >60)
Glucose, Bld: 102 mg/dL — ABNORMAL HIGH (ref 70–99)
Potassium: 4.5 mmol/L (ref 3.5–5.1)
Sodium: 135 mmol/L (ref 135–145)

## 2020-09-27 LAB — DIGOXIN LEVEL: Digoxin Level: 0.5 ng/mL — ABNORMAL LOW (ref 0.8–2.0)

## 2020-09-27 LAB — ECHOCARDIOGRAM COMPLETE
S' Lateral: 6.1 cm
Single Plane A4C EF: 25.9 %

## 2020-09-27 MED ORDER — METOPROLOL SUCCINATE ER 50 MG PO TB24: 75.0000 mg | ORAL_TABLET | Freq: Every day | ORAL | 11 refills | Status: DC

## 2020-09-27 MED ORDER — FUROSEMIDE 40 MG PO TABS: ORAL_TABLET | ORAL | 11 refills | Status: DC

## 2020-09-27 NOTE — Progress Notes (Signed)
Advanced Heart Failure Clinic Note  PCP: [facility] HF physician: Dr. Shirlee Latch  HPI: Kyle Pearson is a 45 y.o. with history of tobacco and alcohol use, diagnosed with CHF in 10/21.    Prior to 11/21, Kyle Pearson reports no past medical history.  He used to smoke less than 1/2 pack per day and drank about 6 pack of beer on the weekend.  He denies family history of heart disease but reported his father used to drink alcohol heavily but quit as well.  He states in 03/16/2020 he started to develop a cough and didn't want to mix medication with alcohol so he stopped drinking and started to take cough medication.  He noticed one day a very severe pain on his left side and pain with swallowing.  He was diagnosed with peritonsillar abscess and placed on clindamycin (300 mg po QID x 10 days) and decadron.  He reports feeling better throat wise but noticed increase swelling to his abdomen and difficulty breathing.  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  He continued to experience trouble breathing and walking short distances were difficulty.    He went to [facility] on 04/24/2020 for shortness of breath, abdominal distention and left sided chest pain. 2D echo showed EF 15-20% with severe LVH, BNP 562 and troponin 54.  He was started on lisinopril 5 mg, toprol-XL 25 mg PO daily and lasix IV. Blood pressure was low and required albumin 12.5 mg IV x 3 doses to help with BP.  He also had an abdominal ultrasound which showed no significant asites but note mild liver cirrhosis with mild gallbladder wall thickening. He was discharged home on 40 mg PO lasix, KCL 20 mEq, lisinopril 5 and toprol-XL 25.    He presented to 1st PCP appt on 05/29/20 and was found to have shortness of breath, bilaterally lower extremity edema and abdominal distention.  Reporting 30lb weight gain despite compliance with medication.  He was sent to [facility] ,  where his BNP was [facility] BNP 562), CXR showed cardiomegaly only.  Repeat echo revealed EF < 20%, severe decrease function with global hypokinesis, grade III DD, dilated LA/RA and small pericardial effusion. He was admitted 05/29/20-06/06/20 with marked volume overload. Diuresed with IV lasix and set for cath. Cath showed elevated filling pressures and low ouput. Milrinone was added and he continued to diurese on IV. Milrinone later weaned off with stable mixed venous saturation. Started on GDMT with 4 corner stone HF meds. Had cMRI with severely reduced EF and non-coronary pattern extensive scar. Due to concern for ectopy from scar, he was discharged with Lifevest.  Diuresed 35 lbs. Discharge weight 255.6 lbs.  Echo was done today and reviewed, EF remains < 20%, moderate LVE, moderate RVE, mildly decreased RV systolic function, IVC dilated.  He returns today for followup of CHF.  He has been doing very well symptomatically.  No ETOH or smoking.  He has no significant exertional dyspnea.  No orthopnea/PND.  No chest pain.  No lightheadedness.  He wears his Lifevest most of the time.  He was recently found to have OSA by sleep study.   ECG (personally reviewed): NSR, 1st degree AVB, RPFB, iRBBB  Labs (2/22): digoxin 0.3, K 4.3, creatinine 0.85  PMH: 1. Prior smoker. 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  HIV negative.  No strong family history. Prior heavy  ETOH but quit in 9/21.   - Echo (11/21): EF < 20%, severe LV dilation, severe RVE and severely decreased RV systolic function, severe biatrial enlargement.  - Cardiac MRI (11/21): Severely dilated LV with diffuse hypokinesis, EF 17%., mod dilated RV with EF 20%, diffuse mid-wall LGE in septum, inferior wall & inferolateral wall - LHC/RHC (12/21): No significant CAD; mean RA 29, PA 69/32 mean 49, mean PCWP 43, CI 2.1 (Fick) and 1.7 (thermo), PVR 1.22 - Echo (3/22): EF < 20%, moderate LVE, moderate RVE,  mildly decreased RV systolic function, IVC dilated.  4. OSA  ROS: All systems negative except as listed in HPI, PMH and Problem List.  SH:  Social History   Socioeconomic History  . Marital status: Single    Spouse name: Not on file  . Number of children: Not on file  . Years of education: Not on file  . Highest education level: Not on file  Occupational History  . Not on file  Tobacco Use  . Smoking status: Former Smoker    Quit date: 2020    Years since quitting: 2.2  . Smokeless tobacco: Never Used  Vaping Use  . Vaping Use: Never used  Substance and Sexual Activity  . Alcohol use: Not Currently  . Drug use: Never  . Sexual activity: Not on file  Other Topics Concern  . Not on file  Social History Narrative  . Not on file   Social Determinants of Health   Financial Resource Strain: Not on file  Food Insecurity: Not on file  Transportation Needs: Not on file  Physical Activity: Not on file  Stress: Not on file  Social Connections: Not on file  Intimate Partner Violence: Not on file    FH: History reviewed. No pertinent family history.   Current Outpatient Medications  Medication Sig Dispense Refill  . bisacodyl (DULCOLAX) 5 MG EC tablet Take 10 mg by mouth as needed for mild constipation.     . digoxin (LANOXIN) 0.125 MG tablet TAKE 1 TABLET BY MOUTH DAILY 90 tablet 3  . empagliflozin (JARDIANCE) 10 MG TABS tablet Take 1 tablet (10 mg total) by mouth daily before breakfast. 90 tablet 3  . ivabradine (CORLANOR) 5 MG TABS tablet Take 1 tablet (5 mg total) by mouth 2 (two) times daily with a meal. 180 tablet 3  . Multiple Vitamin (MULTI-VITAMIN) tablet Take 1 tablet by mouth daily.    . potassium chloride SA (KLOR-CON) 20 MEQ tablet Take 20 mEq by mouth daily.    . sacubitril-valsartan (ENTRESTO) 97-103 MG Take 1 tablet by mouth 2 (two) times daily. 180 tablet 3  . spironolactone (ALDACTONE) 25 MG tablet Take 1 tablet (25 mg total) by mouth daily. 30 tablet 6   . thiamine 100 MG tablet Take 1 tablet (100 mg total) by mouth daily. 30 tablet 1  . furosemide (LASIX) 40 MG tablet Take 1 tablet (40 mg total) by mouth every morning AND 0.5 tablets (20 mg total) every evening. 45 tablet 11  . metoprolol succinate (TOPROL-XL) 50 MG 24 hr tablet Take 1.5 tablets (75 mg total) by mouth daily. 45 tablet 11   No current facility-administered medications for this encounter.   Vitals:   09/27/20 1451  BP: 124/82  Pulse: 90  SpO2: 98%  Weight: 122.2 kg (269 lb 6.4 oz)     Wt Readings from Last 3 Encounters:  09/27/20 122.2 kg (269 lb 6.4 oz)  08/19/20 117.9 kg (260 lb)  08/14/20 121.3 kg (  267 lb 6.4 oz)    PHYSICAL EXAM: General: NAD Neck: Thick. JVP 8-9 cm, no thyromegaly or thyroid nodule.  Lungs: Clear to auscultation bilaterally with normal respiratory effort. CV: Nondisplaced PMI.  Heart regular S1/S2, no S3/S4, no murmur.  Trace ankle edema.  No carotid bruit.  Normal pedal pulses.  Abdomen: Soft, nontender, no hepatosplenomegaly, no distention.  Skin: Intact without lesions or rashes.  Neurologic: Alert and oriented x 3.  Psych: Normal affect. Extremities: No clubbing or cyanosis.  HEENT: Normal.   ASSESSMENT & PLAN: 1. Chronic systolic CHF: Nonischemic cardiomyopathy. Symptomatic since 9/21 (after episode of peri-tonsillar abscess). He was a prior drinker (none since 9/21) but does not seem to describe heavy enough ETOH to cause profound cardiomyopathy (though he did have mild cirrhosis on abdominal US at [facility]). No FH of cardiomyopathy. Born in [place], has been in [place] x many years. HIV negative 9/21. Echo 11/21 with EF <20% with severe LV dilation, moderate RVE with severely decreased systolic function, severe biatrial enlargement, mild MR. RHC/LHC 12/21 showed no coronary disease, markedly elevated filling pressures, and low cardiac output. Cardiac MRI in 11/21 showed severely dilated LV with diffuse hypokinesis, EF 17%, mod dilated RV  with EF 20%, diffuse mid-wall LGE in septum, inferior wall & inferolateral wall.  LGE pattern suggests possible prior myocarditis. Echo done today showed that EF remains < 20% with mild RV dysfunction.  NYHA class I-II symptoms, mild volume overload on exam with dilated IVC on today's echo.  - Continue Entresto to 97/103 mg bid.  -Increase Lasix to 40 qam/20 qpm with BMET today and again in 10 days.  - Continue digoxin 0.125 daily, check level today.  - Increase Toprol XL to 75 mg daily.  - Continue spironolactone 25 mg daily.  - Continue Dapagliflozin 10 mg daily.  -Continueivabradine 5 mg bid.  - I will refer for ICD with persistently low EF.  He is not a CRT candidate.  Once ICD is placed, can stop Lifevest.  - I will arrange for CPX for risk stratification.  2. ETOH: He did have early cirrhosis on abdominal US at [facility]. However, with obesity could be NASH. No further ETOH (abstinence does not appear to have helped LV EF, doubt ETOH is the major cause for low EF). 3. OSA: Needs CPAP.   Followup with APP in 6 wks.   Marca Ancona,  09/27/20

## 2020-09-27 NOTE — Patient Instructions (Addendum)
Labs done today. We will contact you only if your labs are abnormal.  INCREASE Lasix to 40mg  (1 tablet) by mouth every morning and 20mg  (1/2 tablet) by mouth every evening.  INCREASE Metoprolol to 75mg  (1 & 1/2 tablets) by mouth daily.  No other medication changes were made. Please continue all current medications as prescribed.  You have been referred to Cardiac Electrophysiology. They will contact you to schedule an appointment.   Your physician recommends that you schedule a follow-up appointment in: 10 days for a lab only appointment, soon for a CPX test and in 6 weeks with our APP Clinic here in office.   Your physician has recommended that you have a cardiopulmonary stress test (CPX). CPX testing is a non-invasive measurement of heart and lung function. It replaces a traditional treadmill stress test. This type of test provides a tremendous amount of information that relates not only to your present condition but also for future outcomes. This test combines measurements of you ventilation, respiratory gas exchange in the lungs, electrocardiogram (EKG), blood pressure and physical response before, during, and following an exercise protocol. Please refer to the handout that was given to you today at your visit.  If you have any questions or concerns before your next appointment please send a message through Trainer or call our office at [phone].    TO LEAVE A MESSAGE FOR THE NURSE SELECT OPTION 2, PLEASE LEAVE A MESSAGE INCLUDING: . YOUR NAME . DATE OF BIRTH . CALL BACK NUMBER . REASON FOR CALL**this is important as we prioritize the call backs  YOU WILL RECEIVE A CALL BACK THE SAME DAY AS LONG AS YOU CALL BEFORE 4:00 PM   Do the following things EVERYDAY: 1) Weigh yourself in the morning before breakfast. Write it down and keep it in a log. 2) Take your medicines as prescribed 3) Eat low salt foods--Limit salt (sodium) to 2000 mg per day.  4) Stay as active as you can  everyday 5) Limit all fluids for the day to less than 2 liters   At the Advanced Heart Failure Clinic, you and your health needs are our priority. As part of our continuing mission to provide you with exceptional heart care, we have created designated Provider Care Teams. These Care Teams include your primary Cardiologist (physician) and Advanced Practice Providers (APPs- Physician Assistants and Nurse Practitioners) who all work together to provide you with the care you need, when you need it.   You may see any of the following providers on your designated Care Team at your next follow up: [place] [address] [place] . Dr [phone] . Marland Kitchen, NP . Arvilla Meres, PA . Marca Ancona, PharmD   Please be sure to bring in all your medications bottles to every appointment.

## 2020-09-27 NOTE — Progress Notes (Incomplete)
Advanced Heart Failure Clinic Note  PCP: [facility] HF physician: Dr. Shirlee Latch  HPI: Kyle Pearson is a 45 y.o. with history of tobacco and alcohol use, diagnosed with CHF in 10/21.    Prior to 11/21, [name] reports no past medical history.  He used to smoke less than 1/2 pack per day and drank about 6 pack of beer on the weekend.  He denies family history of heart disease but reported his father used to drink alcohol heavily but quit as well.  He states in 03/16/2020 he started to develop a cough and didn't want to mix medication with alcohol so he stopped drinking and started to take cough medication.  He noticed one day a very severe pain on his left side and pain with swallowing.  He was diagnosed with peritonsillar abscess and placed on clindamycin (300 mg po QID x 10 days) and decadron.  He reports feeling better throat wise but noticed increase swelling to his abdomen and difficulty breathing.  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  He continued to experience trouble breathing and walking short distances were difficulty.    He went to [facility] on 04/24/2020 for shortness of breath, abdominal distention and left sided chest pain. 2D echo showed EF 15-20% with severe LVH, BNP 562 and troponin 54.  He was started on lisinopril 5 mg, toprol-XL 25 mg PO daily and lasix IV. Blood pressure was low and required albumin 12.5 mg IV x 3 doses to help with BP.  He also had an abdominal ultrasound which showed no significant asites but note mild liver cirrhosis with mild gallbladder wall thickening. He was discharged home on 40 mg PO lasix, KCL 20 mEq, lisinopril 5 and toprol-XL 25.    He presented to 1st PCP appt on 05/29/20 and was found to have shortness of breath, bilaterally lower extremity edema and abdominal distention.  Reporting 30lb weight gain despite compliance with medication.  He was sent to [facility] ,  where his BNP was [facility] BNP 562), CXR showed cardiomegaly only.  Repeat echo revealed EF < 20%, severe decrease function with global hypokinesis, grade III DD, dilated LA/RA and small pericardial effusion. He was admitted 05/29/20-06/06/20 with marked volume overload. Diuresed with IV lasix and set for cath. Cath showed elevated filling pressures and low ouput. Milrinone was added and he continued to diurese on IV. Milrinone later weaned off with stable mixed venous saturation. Started on GDMT with 4 corner stone HF meds. Had cMRI with severely reduced EF and non-coronary pattern extensive scar. Due to concern for ectopy from scar, he was discharged with Lifevest.  Diuresed 35 lbs. Discharge weight 255.6 lbs.  Echo was done today and reviewed, EF remains < 20%, moderate LVE, moderate RVE, mildly decreased RV systolic function, IVC dilated.  He returns today for followup of CHF.  He has been doing very well symptomatically.  No ETOH or smoking.  He has no significant exertional dyspnea.  No orthopnea/PND.  No chest pain.  No lightheadedness.  He wears his Lifevest most of the time.  He was recently found to have OSA by sleep study.   ECG (personally reviewed): NSR, 1st degree AVB, RPFB, iRBBB  Labs (2/22): digoxin 0.3, K 4.3, creatinine 0.85  PMH: 1. Prior smoker. 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  HIV negative.  No strong family history. Prior heavy  ETOH but quit in 9/21.   - Echo (11/21): EF < 20%, severe LV dilation, severe RVE and severely decreased RV systolic function, severe biatrial enlargement.  - Cardiac MRI (11/21): Severely dilated LV with diffuse hypokinesis, EF 17%., mod dilated RV with EF 20%, diffuse mid-wall LGE in septum, inferior wall & inferolateral wall - LHC/RHC (12/21): No significant CAD; mean RA 29, PA 69/32 mean 49, mean PCWP 43, CI 2.1 (Fick) and 1.7 (thermo), PVR 1.22 - Echo (3/22): EF < 20%, moderate LVE, moderate RVE,  mildly decreased RV systolic function, IVC dilated.  4. OSA  ROS: All systems negative except as listed in HPI, PMH and Problem List.  SH:  Social History   Socioeconomic History  . Marital status: Single    Spouse name: Not on file  . Number of children: Not on file  . Years of education: Not on file  . Highest education level: Not on file  Occupational History  . Not on file  Tobacco Use  . Smoking status: Former Smoker    Quit date: 2020    Years since quitting: 2.2  . Smokeless tobacco: Never Used  Vaping Use  . Vaping Use: Never used  Substance and Sexual Activity  . Alcohol use: Not Currently  . Drug use: Never  . Sexual activity: Not on file  Other Topics Concern  . Not on file  Social History Narrative  . Not on file   Social Determinants of Health   Financial Resource Strain: Not on file  Food Insecurity: Not on file  Transportation Needs: Not on file  Physical Activity: Not on file  Stress: Not on file  Social Connections: Not on file  Intimate Partner Violence: Not on file    FH: History reviewed. No pertinent family history.   Current Outpatient Medications  Medication Sig Dispense Refill  . bisacodyl (DULCOLAX) 5 MG EC tablet Take 10 mg by mouth as needed for mild constipation.     . digoxin (LANOXIN) 0.125 MG tablet TAKE 1 TABLET BY MOUTH DAILY 90 tablet 3  . empagliflozin (JARDIANCE) 10 MG TABS tablet Take 1 tablet (10 mg total) by mouth daily before breakfast. 90 tablet 3  . ivabradine (CORLANOR) 5 MG TABS tablet Take 1 tablet (5 mg total) by mouth 2 (two) times daily with a meal. 180 tablet 3  . Multiple Vitamin (MULTI-VITAMIN) tablet Take 1 tablet by mouth daily.    . potassium chloride SA (KLOR-CON) 20 MEQ tablet Take 20 mEq by mouth daily.    . sacubitril-valsartan (ENTRESTO) 97-103 MG Take 1 tablet by mouth 2 (two) times daily. 180 tablet 3  . spironolactone (ALDACTONE) 25 MG tablet Take 1 tablet (25 mg total) by mouth daily. 30 tablet 6   . thiamine 100 MG tablet Take 1 tablet (100 mg total) by mouth daily. 30 tablet 1  . furosemide (LASIX) 40 MG tablet Take 1 tablet (40 mg total) by mouth every morning AND 0.5 tablets (20 mg total) every evening. 45 tablet 11  . metoprolol succinate (TOPROL-XL) 50 MG 24 hr tablet Take 1.5 tablets (75 mg total) by mouth daily. 45 tablet 11   No current facility-administered medications for this encounter.   Vitals:   09/27/20 1451  BP: 124/82  Pulse: 90  SpO2: 98%  Weight: 122.2 kg (269 lb 6.4 oz)     Wt Readings from Last 3 Encounters:  09/27/20 122.2 kg (269 lb 6.4 oz)  08/19/20 117.9 kg (260 lb)  08/14/20 121.3 kg (  267 lb 6.4 oz)   LifeVest: no events, average daily use 12 hours, wearing 50% of the time, average daily steps 1653.  PHYSICAL EXAM: General:  NAD. No resp difficulty HEENT: Normal Neck: Supple. No JVD. Carotids 2+ bilat; no bruits. No lymphadenopathy or thryomegaly appreciated. Cor: PMI nondisplaced. Regular rate & rhythm. No rubs, gallops or murmurs. Lungs: Clear Abdomen: Soft, nontender, nondistended. No hepatosplenomegaly. No bruits or masses. Good bowel sounds. Extremities: No cyanosis, clubbing, rash, edema Neuro: Aert & oriented x 3, cranial nerves grossly intact. Moves all 4 extremities w/o difficulty. Affect pleasant.  ASSESSMENT & PLAN: 1. Chronic systolic CHF: Nonischemic cardiomyopathy. Symptomatic since 9/21 (after episode of peri-tonsillar abscess). He was a prior drinker (none since 9/21) but does not seem to describe heavy enough ETOH to cause profound cardiomyopathy (though he did have mild cirrhosis on abdominal US at [facility]). No FH of cardiomyopathy. Born in [place], has been in [place] x many years. T cruzi testing sent at [facility]. HIV negative 9/21. Echo this admission with EF <20% with severe LV dilation, moderate RVE with severely decreased systolic function, severe biatrial enlargement, mild MR. RHC/LHC 12/2 showed no coronary disease,  markedly elevated filling pressures, and low cardiac output.  - Stable NYHA II, functional status confounded by body habitus and general physical inactivity, volume status good today.  - Continue Entresto to 97/103 mg bid.  -Continue lasix 40 mg daily.  - Continue digoxin 0.125 daily.  - Continue Toprol XL 25 daily. No BP room to increase today. - Continue spironolactone 25 mg daily.  - Continue Dapagliflozin 10 mg daily.  -Continueivabradine with mild sinus tachycardia, would not push beta blocker further with soft BP. Prior authorization approved.  - Cardiac MRI- EF 17% RV 20%. Possible prior myocarditis and scar noted.--> Continue Life Vest for 3 months. No work until seen back by provider. - BMET, dig level today. - Repeat Echo next month. 2. ETOH: Cut back on ETOH but still drinking on weekends. He did have early cirrhosis on abdominal US at [facility]. However, with obesity could be NASH.  - LFTs 11/21 ok. - Encouraged abstinence from ETOH. 3. Suspect OSA - Wife says he snores. - Sleep study ordered. 4. Obesity Body mass index is 37.57 kg/m.  - Encouraged dietary and lifestyle changes to aid in weight loss.  Follow up next month with Dr. Shirlee Latch w/ echo.  Marca Ancona, FNP-BC 09/27/20 11:51 PM

## 2020-09-27 NOTE — Progress Notes (Signed)
  Echocardiogram 2D Echocardiogram has been performed.  Delcie Roch 09/27/2020, 2:46 PM

## 2020-09-28 ENCOUNTER — Encounter (HOSPITAL_COMMUNITY): Payer: Self-pay | Admitting: *Deleted

## 2020-09-28 NOTE — Progress Notes (Signed)
Pt dropped off a [employer] for Duty Form that he needed completed before he can RTW, form completed with no restrictions and signed by Dr Shirlee Latch, form faxed to [phone], pt aware and given copy for his records

## 2020-10-07 ENCOUNTER — Other Ambulatory Visit (HOSPITAL_COMMUNITY): Payer: Self-pay | Admitting: Family Medicine

## 2020-10-09 ENCOUNTER — Other Ambulatory Visit: Payer: Self-pay

## 2020-10-09 ENCOUNTER — Ambulatory Visit (HOSPITAL_COMMUNITY)
Admission: RE | Admit: 2020-10-09 | Discharge: 2020-10-09 | Disposition: A | Discharge status: Home / Self Care | Payer: 59 | Source: Ambulatory Visit | Attending: Internal Medicine | Admitting: Internal Medicine

## 2020-10-09 DIAGNOSIS — I5022 Chronic systolic (congestive) heart failure: Secondary | ICD-10-CM | POA: Insufficient documentation

## 2020-10-09 LAB — BASIC METABOLIC PANEL
Anion gap: 6 (ref 5–15)
BUN: 15 mg/dL (ref 6–20)
CO2: 22 mmol/L (ref 22–32)
Calcium: 8.4 mg/dL — ABNORMAL LOW (ref 8.9–10.3)
Chloride: 107 mmol/L (ref 98–111)
Creatinine, Ser: 0.75 mg/dL (ref 0.61–1.24)
GFR, Estimated: >60 mL/min (ref >60)
Glucose, Bld: 100 mg/dL — ABNORMAL HIGH (ref 70–99)
Potassium: 3.9 mmol/L (ref 3.5–5.1)
Sodium: 135 mmol/L (ref 135–145)

## 2020-10-23 ENCOUNTER — Other Ambulatory Visit: Payer: Self-pay

## 2020-10-23 ENCOUNTER — Ambulatory Visit (HOSPITAL_COMMUNITY): Payer: 59 | Attending: Cardiology

## 2020-10-23 DIAGNOSIS — Z6841 Body Mass Index (BMI) 40.0 and over, adult: Secondary | ICD-10-CM | POA: Diagnosis not present

## 2020-10-23 DIAGNOSIS — I5022 Chronic systolic (congestive) heart failure: Secondary | ICD-10-CM | POA: Insufficient documentation

## 2020-10-23 DIAGNOSIS — I509 Heart failure, unspecified: Secondary | ICD-10-CM | POA: Diagnosis not present

## 2020-10-26 ENCOUNTER — Encounter (HOSPITAL_COMMUNITY): Payer: Self-pay | Admitting: *Deleted

## 2020-10-26 NOTE — Progress Notes (Signed)
Received pt's STD claim forms from [employer]. Forms completed w/RTW date of 09/28/20 with no restrictions and signed by Dr Gala Romney  Forms faxed to [employer] at [phone], copy mailed to pt's home

## 2020-10-31 ENCOUNTER — Institutional Professional Consult (permissible substitution): Payer: 59 | Admitting: Cardiology

## 2020-10-31 NOTE — Progress Notes (Deleted)
Electrophysiology Office Note   Date:  10/31/2020   ID:  Kyle Pearson, DOB 18-Feb-1976, MRN [id number]  PCP:  Patient, No Pcp Per (Inactive)  Cardiologist:  Shirlee Latch Primary Electrophysiologist:  Kyle Girdler Jorja Loa, MD    Chief Complaint: CHF   History of Present Illness: Kyle Pearson is a 45 y.o. male who is being seen today for the evaluation of CHF at the request of Kyle Morale, MD. Presenting today for electrophysiology evaluation.  He has a history significant for tobacco and alcohol abuse.  He was diagnosed with heart failure October 2021.  He used to smoke half a pack a day and drinks 6 pack of beer on the weekend.  Otherwise he had no past medical history.  He presented to [facility] 04/24/2020 with shortness of breath and abdominal distention.  An echo showed an ejection fraction of 15 to 20% with severe LVH.  He was started on optimal medical therapy.  Abdominal ultrasound showed no societies but mild cirrhosis with gallbladder wall thickening.  He was admitted to the hospital 05/29/2020 with volume overload.  He was diuresed with IV Lasix.  Left heart catheterization showed elevated filling pressures and low output.  He was started on milrinone.  He had no evidence of coronary artery disease.  Cardiac MRI showed a severely reduced EF when a noncoronary scar pattern.  Today, he denies*** symptoms of palpitations, chest pain, shortness of breath, orthopnea, PND, lower extremity edema, claudication, dizziness, presyncope, syncope, bleeding, or neurologic sequela. The patient is tolerating medications without difficulties.    Past Medical History:  Diagnosis Date  . CHF (congestive heart failure) (HCC)    Past Surgical History:  Procedure Laterality Date  . RIGHT/LEFT HEART CATH AND CORONARY ANGIOGRAPHY N/A 06/01/2020   Procedure: RIGHT/LEFT HEART CATH AND CORONARY ANGIOGRAPHY;  Surgeon: Kyle Morale, MD;  Location: [facility] INVASIVE CV LAB;  Service:  Cardiovascular;  Laterality: N/A;     Current Outpatient Medications  Medication Sig Dispense Refill  . bisacodyl (DULCOLAX) 5 MG EC tablet Take 10 mg by mouth as needed for mild constipation.     . digoxin (LANOXIN) 0.125 MG tablet TAKE 1 TABLET BY MOUTH DAILY 90 tablet 3  . empagliflozin (JARDIANCE) 10 MG TABS tablet Take 1 tablet (10 mg total) by mouth daily before breakfast. 90 tablet 3  . furosemide (LASIX) 40 MG tablet Take 1 tablet (40 mg total) by mouth every morning AND 0.5 tablets (20 mg total) every evening. 45 tablet 11  . ivabradine (CORLANOR) 5 MG TABS tablet Take 1 tablet (5 mg total) by mouth 2 (two) times daily with a meal. 180 tablet 3  . metoprolol succinate (TOPROL-XL) 50 MG 24 hr tablet Take 1.5 tablets (75 mg total) by mouth daily. 45 tablet 11  . Multiple Vitamin (MULTI-VITAMIN) tablet Take 1 tablet by mouth daily.    . potassium chloride SA (KLOR-CON) 20 MEQ tablet Take 20 mEq by mouth daily.    . sacubitril-valsartan (ENTRESTO) 97-103 MG Take 1 tablet by mouth 2 (two) times daily. 180 tablet 3  . spironolactone (ALDACTONE) 25 MG tablet Take 1 tablet (25 mg total) by mouth daily. 30 tablet 6  . thiamine 100 MG tablet TAKE 1 TABLET BY MOUTH EVERY DAY 30 tablet 11   No current facility-administered medications for this visit.    Allergies:   Raspberry and Penicillins   Social History:  The patient  reports that he quit smoking about 2 years ago. He has never  used smokeless tobacco. He reports previous alcohol use. He reports that he does not use drugs.   Family History:  The patient's*** family history is not on file.    ROS:  Please see the history of present illness.   Otherwise, review of systems is positive for none.   All other systems are reviewed and negative.    PHYSICAL EXAM: VS:  There were no vitals taken for this visit. , BMI There is no height or weight on file to calculate BMI. GEN: Well nourished, well developed, in no acute distress  HEENT:  normal  Neck: no JVD, carotid bruits, or masses Cardiac: ***RRR; no murmurs, rubs, or gallops,no edema  Respiratory:  clear to auscultation bilaterally, normal work of breathing GI: soft, nontender, nondistended, + BS MS: no deformity or atrophy  Skin: warm and dry Neuro:  Strength and sensation are intact Psych: euthymic mood, full affect  EKG:  EKG {ACTION; IS/IS ERX:[id number]} ordered today. Personal review of the ekg ordered *** shows ***  Recent Labs: 05/29/2020: B Natriuretic Peptide 1,020.9 05/30/2020: ALT 27 06/01/2020: Hemoglobin 13.2; Platelets 216; TSH 2.095 06/03/2020: Magnesium 1.9 10/09/2020: BUN 15; Creatinine, Ser 0.75; Potassium 3.9; Sodium 135    Lipid Panel  No results found for: CHOL, TRIG, HDL, CHOLHDL, VLDL, LDLCALC, LDLDIRECT   Wt Readings from Last 3 Encounters:  09/27/20 269 lb 6.4 oz (122.2 kg)  08/19/20 260 lb (117.9 kg)  08/14/20 267 lb 6.4 oz (121.3 kg)      Other studies Reviewed: Additional studies/ records that were reviewed today include: Right and left heart catheterization 06/01/2020 Personally Reviewed Review of the above and asked records today demonstrates:  1. Markedly elevated right and left heart filling pressures.  2. Pulmonary venous hypertension.  3. Low cardiac output.  4. No significant coronary disease (minimal contrast given).   TTE 09/27/2020 1. Left ventricular ejection fraction, by estimation, is <20%. The left  ventricle has severely decreased function. The left ventricle demonstrates  global hypokinesis. The left ventricular internal cavity size was  moderately dilated. Left ventricular  diastolic parameters are consistent with Grade II diastolic dysfunction  (pseudonormalization).  2. Right ventricular systolic function is moderately reduced. The right  ventricular size is moderately enlarged. There is moderately elevated  pulmonary artery systolic pressure. The estimated right ventricular  systolic pressure is 49.3  mmHg.  3. Left atrial size was moderately dilated.  4. Right atrial size was mildly dilated.  5. The mitral valve is normal in structure. Trivial mitral valve  regurgitation. No evidence of mitral stenosis.  6. The aortic valve is tricuspid. Aortic valve regurgitation is not  visualized. No aortic stenosis is present.  7. The inferior vena cava is dilated in size with <50% respiratory  variability, suggesting right atrial pressure of 15 mmHg.   ASSESSMENT AND PLAN:  1.  Chronic systolic heart failure due to nonischemic cardiomyopathy: Currently on optimal medical therapy with Entresto, Ilario Dhaliwal Toprol-XL, Aldactone, Farxiga, ivabradine.  He is also on twice daily Lasix.  His ejection fraction has remained low.  He would thus benefit from ICD implant.  Risks and benefits have been discussed which were bleeding, tamponade, infection, pneumothorax.  The patient understands these risks and is agreed to the procedure.  We did discuss S ICD implant.  He has agreed that this would be a better option.  2.  Obstructive sleep apnea: CPAP compliance encouraged.    Current medicines are reviewed at length with the patient today.   The patient {ACTIONS;  HAS/DOES NOT HAVE:19233} concerns regarding his medicines.  The following changes were made today:  {NONE DEFAULTED:18576::"none"}  Labs/ tests ordered today include: *** No orders of the defined types were placed in this encounter.    Disposition:   FU with Tametha Banning {gen number [id number]} {Days to years:10300}  Signed, Ciaira Natividad Jorja Loa, MD  10/31/2020 2:33 PM     [facility] [address] [place] [address] [phone] (office) [phone] (fax)

## 2020-11-14 ENCOUNTER — Ambulatory Visit (HOSPITAL_BASED_OUTPATIENT_CLINIC_OR_DEPARTMENT_OTHER): Payer: 59 | Attending: Cardiology | Admitting: Cardiology

## 2020-11-14 ENCOUNTER — Other Ambulatory Visit: Payer: Self-pay

## 2020-11-14 DIAGNOSIS — Z9989 Dependence on other enabling machines and devices: Secondary | ICD-10-CM | POA: Diagnosis not present

## 2020-11-14 DIAGNOSIS — G4733 Obstructive sleep apnea (adult) (pediatric): Secondary | ICD-10-CM | POA: Diagnosis not present

## 2020-11-16 ENCOUNTER — Other Ambulatory Visit (HOSPITAL_BASED_OUTPATIENT_CLINIC_OR_DEPARTMENT_OTHER): Payer: Self-pay

## 2020-11-16 ENCOUNTER — Other Ambulatory Visit: Payer: Self-pay

## 2020-11-16 DIAGNOSIS — R0683 Snoring: Secondary | ICD-10-CM

## 2020-11-16 DIAGNOSIS — G4733 Obstructive sleep apnea (adult) (pediatric): Secondary | ICD-10-CM

## 2020-11-17 ENCOUNTER — Telehealth: Payer: Self-pay

## 2020-11-17 NOTE — Telephone Encounter (Signed)
Kyle Pearson from [employer] called wanting to know if the patient is getting an ICD place and who will be placing the ICD. She needs to know who to send the records to. The patient does not currently have an ICD. [name] phone number is [phone].

## 2020-11-17 NOTE — Telephone Encounter (Signed)
Kyle Pearson upcoming appointment Doctor and fax number to send information.

## 2020-11-21 ENCOUNTER — Encounter (HOSPITAL_COMMUNITY): Payer: 59

## 2020-11-22 ENCOUNTER — Emergency Department (HOSPITAL_COMMUNITY)
Admission: EM | Admit: 2020-11-22 | Discharge: 2020-11-22 | Disposition: A | Discharge status: Home / Self Care | Payer: 59 | Attending: Emergency Medicine | Admitting: Emergency Medicine

## 2020-11-22 ENCOUNTER — Other Ambulatory Visit: Payer: Self-pay

## 2020-11-22 ENCOUNTER — Emergency Department (HOSPITAL_COMMUNITY): Payer: 59

## 2020-11-22 ENCOUNTER — Encounter (HOSPITAL_COMMUNITY): Payer: Self-pay

## 2020-11-22 DIAGNOSIS — Z87891 Personal history of nicotine dependence: Secondary | ICD-10-CM | POA: Diagnosis not present

## 2020-11-22 DIAGNOSIS — I5023 Acute on chronic systolic (congestive) heart failure: Secondary | ICD-10-CM

## 2020-11-22 DIAGNOSIS — I5043 Acute on chronic combined systolic (congestive) and diastolic (congestive) heart failure: Secondary | ICD-10-CM | POA: Insufficient documentation

## 2020-11-22 DIAGNOSIS — R0789 Other chest pain: Secondary | ICD-10-CM | POA: Diagnosis present

## 2020-11-22 DIAGNOSIS — R000 Tachycardia, unspecified: Secondary | ICD-10-CM | POA: Diagnosis not present

## 2020-11-22 DIAGNOSIS — Z20822 Contact with and (suspected) exposure to covid-19: Secondary | ICD-10-CM | POA: Insufficient documentation

## 2020-11-22 LAB — BASIC METABOLIC PANEL
Anion gap: 9 (ref 5–15)
BUN: 11 mg/dL (ref 6–20)
CO2: 22 mmol/L (ref 22–32)
Calcium: 8.7 mg/dL — ABNORMAL LOW (ref 8.9–10.3)
Chloride: 101 mmol/L (ref 98–111)
Creatinine, Ser: 0.87 mg/dL (ref 0.61–1.24)
GFR, Estimated: >60 mL/min (ref >60)
Glucose, Bld: 132 mg/dL — ABNORMAL HIGH (ref 70–99)
Potassium: 3.7 mmol/L (ref 3.5–5.1)
Sodium: 132 mmol/L — ABNORMAL LOW (ref 135–145)

## 2020-11-22 LAB — TROPONIN I (HIGH SENSITIVITY)
Troponin I (High Sensitivity): 40 ng/L — ABNORMAL HIGH (ref <18)
Troponin I (High Sensitivity): 35 ng/L — ABNORMAL HIGH (ref <18)

## 2020-11-22 LAB — RESP PANEL BY RT-PCR (FLU A&B, COVID) ARPGX2
Influenza A by PCR: NEGATIVE
Influenza B by PCR: NEGATIVE
SARS Coronavirus 2 by RT PCR: NEGATIVE

## 2020-11-22 LAB — CBC
HCT: 49.6 % (ref 39.0–52.0)
Hemoglobin: 16.1 g/dL (ref 13.0–17.0)
MCH: 29.5 pg (ref 26.0–34.0)
MCHC: 32.5 g/dL (ref 30.0–36.0)
MCV: 90.8 fL (ref 80.0–100.0)
Platelets: 245 10*3/uL (ref 150–400)
RBC: 5.46 MIL/uL (ref 4.22–5.81)
RDW: 15.2 % (ref 11.5–15.5)
WBC: 10.5 10*3/uL (ref 4.0–10.5)
nRBC: 0 % (ref 0.0–0.2)

## 2020-11-22 LAB — BRAIN NATRIURETIC PEPTIDE: B Natriuretic Peptide: 1246 pg/mL — ABNORMAL HIGH (ref 0.0–100.0)

## 2020-11-22 LAB — DIGOXIN LEVEL: Digoxin Level: <0.2 ng/mL — ABNORMAL LOW (ref 0.8–2.0)

## 2020-11-22 LAB — D-DIMER, QUANTITATIVE: D-Dimer, Quant: 0.32 ug/mL-FEU (ref 0.00–0.50)

## 2020-11-22 MED ORDER — IBUPROFEN 400 MG PO TABS
600.0000 mg | ORAL_TABLET | Freq: Once | ORAL | Status: AC
  Administered 2020-11-22: 600 mg via ORAL
  Filled 2020-11-22: qty 1

## 2020-11-22 MED ORDER — FUROSEMIDE 10 MG/ML IJ SOLN
60.0000 mg | Freq: Once | INTRAMUSCULAR | Status: AC
  Administered 2020-11-22: 60 mg via INTRAVENOUS
  Filled 2020-11-22: qty 6

## 2020-11-22 MED ORDER — DIGOXIN 125 MCG PO TABS
0.1250 mg | ORAL_TABLET | Freq: Every day | ORAL | Status: DC
  Administered 2020-11-22: 0.125 mg via ORAL
  Filled 2020-11-22: qty 1

## 2020-11-22 MED ORDER — ALBUTEROL SULFATE HFA 108 (90 BASE) MCG/ACT IN AERS
2.0000 | INHALATION_SPRAY | Freq: Once | RESPIRATORY_TRACT | Status: AC
  Administered 2020-11-22: 2 via RESPIRATORY_TRACT
  Filled 2020-11-22: qty 6.7

## 2020-11-22 MED ORDER — ONDANSETRON 4 MG PO TBDP
4.0000 mg | ORAL_TABLET | Freq: Once | ORAL | Status: AC
  Administered 2020-11-22: 4 mg via ORAL
  Filled 2020-11-22: qty 1

## 2020-11-22 MED ORDER — POTASSIUM CHLORIDE CRYS ER 20 MEQ PO TBCR
40.0000 meq | EXTENDED_RELEASE_TABLET | Freq: Two times a day (BID) | ORAL | Status: DC
  Administered 2020-11-22: 40 meq via ORAL
  Filled 2020-11-22: qty 2

## 2020-11-22 MED ORDER — METOLAZONE 5 MG PO TABS
5.0000 mg | ORAL_TABLET | Freq: Once | ORAL | Status: AC
  Administered 2020-11-22: 5 mg via ORAL
  Filled 2020-11-22: qty 1

## 2020-11-22 MED ORDER — SACUBITRIL-VALSARTAN 97-103 MG PO TABS
1.0000 | ORAL_TABLET | Freq: Two times a day (BID) | ORAL | Status: DC
  Administered 2020-11-22: 1 via ORAL
  Filled 2020-11-22 (×2): qty 1

## 2020-11-22 NOTE — ED Triage Notes (Signed)
Patient complains of chest pain and cough x 20 DAYS. Denies fever, no congestion. Patient is alert and oriented, NAD

## 2020-11-22 NOTE — Discharge Instructions (Addendum)
As discussed, it is important to follow-up with Dr. Shirlee Latch next week.  Until you have seen Dr. Shirlee Latch, please take Lasix 40 mg, in the morning and at night.  Returning for concerning changes in your condition.

## 2020-11-22 NOTE — ED Notes (Signed)
Patient Alert and oriented to baseline. Stable and ambulatory to baseline. Patient verbalized understanding of the discharge instructions.  Patient belongings were taken by the patient.   

## 2020-11-22 NOTE — ED Provider Notes (Signed)
Emergency Medicine Provider Triage Evaluation Note  Kyle Pearson , a 45 y.o. male  was evaluated in triage.  Pt complains of 3 weeks of nonproductive cough, now with 24 hours of chills, nausea, vomiting, HA and chest congestion. Has had COVID vaccine, per patient hx of blood clot, not anticoagulated. Hx of CHF, LVEF < 20%, scheduling to have ICD placed.   Review of Systems  Positive: Cough, SOB, HA, Chills, N/V Negative: D, abdominal pain, visual disturbance  Physical Exam  BP (!) 145/106 (BP Location: Right Arm)   Pulse (!) 122   Temp 98.5 F (36.9 C) (Oral)   Resp 18   SpO2 96%  Gen:   Awake, no distress   Resp:  Normal effort , wheezing.  MSK:   Moves extremities without difficulty  Other:  Tachycardic  Medical Decision Making  Medically screening exam initiated at 10:19 AM.  Appropriate orders placed.  Kyle Pearson was informed that the remainder of the evaluation will be completed by another provider, this initial triage assessment does not replace that evaluation, and the importance of remaining in the ED until their evaluation is complete.  This chart was dictated using voice recognition software, Dragon. Despite the best efforts of this provider to proofread and correct errors, errors may still occur which can change documentation meaning.    Paris Lore, PA-C 11/22/20 1021    Rozelle Logan, [place] 11/22/20 1637

## 2020-11-22 NOTE — Progress Notes (Signed)
  Good response from IV lasix and metolazone.   > 1200 cc urine  Discussed with Dr Gala Romney.   Ok to d/c home on current HF meds and increase home lasix to 40 mg twice a day  HF follow up made for next 11/29/20 at 920  Rakwon Letourneau NP-C  3:28 PM

## 2020-11-22 NOTE — Consult Note (Addendum)
Advanced Heart Failure Team Consult Note   Primary Physician: Patient, No Pcp Per (Inactive) PCP-Cardiologist:  None  Reason for Consultation: Heart Failure   HPI:    Eliezer Khawaja is seen today for evaluation of heart failure at the request of Dr Jeraldine Loots.   Mr Karen Kitchens is a 45 year old with history of tobacco and alcohol use, diagnosed with CHF in 10/21.    Diagnosed with HF back in 2021 with EF 15-20%.   Admitted at [facility] 05/29/20 with A/C systolic heart failure.  Diuresed with IV lasix and set for cath. Cath showed elevated filling pressures and low ouput. Milrinone was added and he continued to diurese on IV. Milrinone later weaned off with stable mixed venous saturation. Started on GDMT with 4 corner stone HF meds. Had cMRI with severely reduced EF and non-coronary pattern extensive scar. Due to concern for ectopy from scar, he was discharged with [employer]. Diuresed 35 lbs. Discharge weight 255.6 lbs.  Followed closely by Dr Shirlee Latch and was last seen 09/27/20. Echo repeat and showed EF <20% with mild RV dysfunction. He was referred to EP for ICD. Missed EP 11/21/20 and has rescheduled to  11/30/20.   Presented to with shortness of breath. Yesterday he missed all medications and had higher sodium foods (chicken noodle soup with crackers). Weight had gone up 4 pounds. Also reports cough and wheezing over the last week. CXR with pulmonary edema. Pertinent labs: [id number], creatinine 0.87, K 3.7, HS Trop 42, SARS2 negative. Given 60 mg IV lasix x1.   Cardiac Testing  Echo (11/21): EF < 20%, severe LV dilation, severe RVE and severely decreased RV systolic function, severe biatrial enlargement.  - Cardiac MRI (11/21): Severely dilated LV with diffuse hypokinesis, EF 17%., mod dilated RV with EF 20%, diffuse mid-wall LGE in septum, inferior wall & inferolateral wall - LHC/RHC (12/21): No significant CAD; mean RA 29, PA 69/32 mean 49, mean PCWP 43, CI 2.1 (Fick) and 1.7 (thermo), PVR  1.22 - Echo (3/22): EF < 20%, moderate LVE, moderate RVE, mildly decreased RV systolic function, IVC dilated.    Review of Systems: [y] = yes, [ ]  = no   . General: Weight gain [Y ]; Weight loss [ ] ; Anorexia [ ] ; Fatigue [ Y]; Fever [ ] ; Chills [ ] ; Weakness [ ]   . Cardiac: Chest pain/pressure [ ] ; Resting SOB [ ] ; Exertional SOB [Y ]; Orthopnea [ Y]; Pedal Edema [ ] ; Palpitations [ ] ; Syncope [ ] ; Presyncope [ ] ; Paroxysmal nocturnal dyspnea[ ]   . Pulmonary: Cough [Y ]; Wheezing[ ] ; Hemoptysis[ ] ; Sputum [Y ]; Snoring [ ]   . GI: Vomiting[ ] ; Dysphagia[ ] ; Melena[ ] ; Hematochezia [ ] ; Heartburn[ ] ; Abdominal pain [ ] ; Constipation [ ] ; Diarrhea [ ] ; BRBPR [ ]   . GU: Hematuria[ ] ; Dysuria [ ] ; Nocturia[ ]   . Vascular: Pain in legs with walking [ ] ; Pain in feet with lying flat [ ] ; Non-healing sores [ ] ; Stroke [ ] ; TIA [ ] ; Slurred speech [ ] ;  . Neuro: Headaches[ ] ; Vertigo[ ] ; Seizures[ ] ; Paresthesias[ ] ;Blurred vision [ ] ; Diplopia [ ] ; Vision changes [ ]   . Ortho/Skin: Arthritis [ ] ; Joint pain [ ] ; Muscle pain [ ] ; Joint swelling [ ] ; Back Pain [ ] ; Rash [ ]   . Psych: Depression[ ] ; Anxiety[ ]   . Heme: Bleeding problems [ ] ; Clotting disorders [ ] ; Anemia [ ]   . Endocrine: Diabetes [ ] ; Thyroid dysfunction[ ]   Home Medications Prior  to Admission medications   Medication Sig Start Date End Date Taking? Authorizing Provider  bisacodyl (DULCOLAX) 5 MG EC tablet Take 10 mg by mouth as needed for mild constipation.     [provider]  digoxin (LANOXIN) 0.125 MG tablet TAKE 1 TABLET BY MOUTH DAILY 09/04/20   [place], Anderson Malta, FNP  empagliflozin (JARDIANCE) 10 MG TABS tablet Take 1 tablet (10 mg total) by mouth daily before breakfast. 07/31/20   Laurey Morale, MD  furosemide (LASIX) 40 MG tablet Take 1 tablet (40 mg total) by mouth every morning AND 0.5 tablets (20 mg total) every evening. 09/27/20   Laurey Morale, MD  ivabradine (CORLANOR) 5 MG TABS tablet Take 1 tablet (5 mg  total) by mouth 2 (two) times daily with a meal. 08/08/20   [place], Anderson Malta, FNP  metoprolol succinate (TOPROL-XL) 50 MG 24 hr tablet Take 1.5 tablets (75 mg total) by mouth daily. 09/27/20   Laurey Morale, MD  Multiple Vitamin (MULTI-VITAMIN) tablet Take 1 tablet by mouth daily.    [provider]  potassium chloride SA (KLOR-CON) 20 MEQ tablet Take 20 mEq by mouth daily.    [provider]  sacubitril-valsartan (ENTRESTO) 97-103 MG Take 1 tablet by mouth 2 (two) times daily. 07/31/20   Laurey Morale, MD  spironolactone (ALDACTONE) 25 MG tablet Take 1 tablet (25 mg total) by mouth daily. 08/08/20   Jacklynn Ganong, FNP  thiamine 100 MG tablet TAKE 1 TABLET BY MOUTH EVERY DAY 10/11/20   Jacklynn Ganong, FNP    Past Medical History: Past Medical History:  Diagnosis Date  . CHF (congestive heart failure) (HCC)     Past Surgical History: Past Surgical History:  Procedure Laterality Date  . RIGHT/LEFT HEART CATH AND CORONARY ANGIOGRAPHY N/A 06/01/2020   Procedure: RIGHT/LEFT HEART CATH AND CORONARY ANGIOGRAPHY;  Surgeon: Laurey Morale, MD;  Location: [facility] INVASIVE CV LAB;  Service: Cardiovascular;  Laterality: N/A;    Family History: No family history on file.  Social History: Social History   Socioeconomic History  . Marital status: Single    Spouse name: Not on file  . Number of children: Not on file  . Years of education: Not on file  . Highest education level: Not on file  Occupational History  . Not on file  Tobacco Use  . Smoking status: Former Smoker    Quit date: 2020    Years since quitting: 2.3  . Smokeless tobacco: Never Used  Vaping Use  . Vaping Use: Never used  Substance and Sexual Activity  . Alcohol use: Not Currently  . Drug use: Never  . Sexual activity: Not on file  Other Topics Concern  . Not on file  Social History Narrative  . Not on file   Social Determinants of Health   Financial Resource Strain: Not on file  Food  Insecurity: Not on file  Transportation Needs: Not on file  Physical Activity: Not on file  Stress: Not on file  Social Connections: Not on file    Allergies:  Allergies  Allergen Reactions  . Raspberry Anaphylaxis  . Penicillins Rash    Objective:    Vital Signs:   Temp:  [98.5 F (36.9 C)] 98.5 F (36.9 C) (05/25 0940) Pulse Rate:  [101-122] 101 (05/25 1130) Resp:  [18-21] 19 (05/25 1130) BP: (135-145)/(94-106) 135/94 (05/25 1130) SpO2:  [93 %-96 %] 93 % (05/25 1130)    Weight change: There were no vitals filed  for this visit.  Intake/Output:  No intake or output data in the 24 hours ending 11/22/20 1233    Physical Exam    General:  No resp difficulty HEENT: normal Neck: supple. JVP difficult to assess due to body habitus.  . Carotids 2+ bilat; no bruits. No lymphadenopathy or thyromegaly appreciated. Cor: PMI nondisplaced. Regular rate & rhythm. No rubs, or murmurs. +S3  Lungs: EW on room air.  Abdomen: soft, nontender, nondistended. No hepatosplenomegaly. No bruits or masses. Good bowel sounds. Extremities: no cyanosis, clubbing, rash, edema Neuro: alert & orientedx3, cranial nerves grossly intact. moves all 4 extremities w/o difficulty. Affect pleasant   Telemetry   SR 90s   EKG   On arrival ST 120 bpm    Labs   Basic Metabolic Panel: Recent Labs  Lab 11/22/20 1020  NA 132*  K 3.7  CL 101  CO2 22  GLUCOSE 132*  BUN 11  CREATININE 0.87  CALCIUM 8.7*    Liver Function Tests: No results for input(s): AST, ALT, ALKPHOS, BILITOT, PROT, ALBUMIN in the last 168 hours. No results for input(s): LIPASE, AMYLASE in the last 168 hours. No results for input(s): AMMONIA in the last 168 hours.  CBC: Recent Labs  Lab 11/22/20 0950  WBC 10.5  HGB 16.1  HCT 49.6  MCV 90.8  PLT 245    Cardiac Enzymes: No results for input(s): CKTOTAL, CKMB, CKMBINDEX, TROPONINI in the last 168 hours.  BNP: BNP (last 3 results) Recent Labs    05/29/20 1255  11/22/20 0950  BNP 1,020.9* 1,246.0*    ProBNP (last 3 results) No results for input(s): PROBNP in the last 8760 hours.   CBG: No results for input(s): GLUCAP in the last 168 hours.  Coagulation Studies: No results for input(s): LABPROT, INR in the last 72 hours.   Imaging   DG Chest 2 View  Result Date: 11/22/2020 CLINICAL DATA:  Chest pain and cough over the last several weeks. EXAM: CHEST - 2 VIEW COMPARISON:  05/29/2020 FINDINGS: Chronically enlarged cardiac silhouette. Mild thoracic tortuosity. Pulmonary venous hypertension with interstitial pulmonary edema and a small amount of fluid in the fissures. No infiltrate, collapse or measurable effusion. No significant bone finding. IMPRESSION: Congestive heart failure. Enlarged cardiac silhouette. Pulmonary venous hypertension. Interstitial pulmonary edema. Small amount of fluid in the fissures. Electronically Signed   By: Paulina Fusi M.D.   On: 11/22/2020 10:24      Medications:     Current Medications: . furosemide  60 mg Intravenous Once     Infusions:     Assessment/Plan   1. Acute/Chronic Systolic Heart Failure, NICM --> viral  RHC/LHC 12/21 showed no coronary disease, markedly elevated filling pressures, and low cardiac output.  Cardiac MRI in 11/21 showed severely dilated LV with diffuse hypokinesis, EF 17%, mod dilated RV with EF 20%, diffuse mid-wall LGE in septum, inferior wall & inferolateral wall.  LGE pattern suggests possible prior myocarditis. Echo 08/2020  EF remains < 20% with mild RV dysfunction>> referred to EP missed appointment but has rescheduled for 11/30/20 with Dr Elberta Fortis. Needs to continue Life Vest until he sees EP.  - BNP elevated. CXR with evidence of pulmonary edema. He did miss HF meds yesterday but this could also represent low output given 3rd heart sound.  - Given 60 mg IV lasix with sluggish urine output. Give 5 mg metolazone now. If urine output improves he may be able to go home with  close follow up in the HF clinic.  -  Give 0.125 mg digoxin daily - Give entresto 97-103 mg twice a day  - renal function stable.   2.HTN  Restart entresto   3.  ETOH  He had stopped last year but now drinking 2-3 beers on the weekend.     Medication concerns reviewed with patient and pharmacy team. Barriers identified: he is able to get HF meds .   Length of Stay: 0  Tonye Becket, NP  11/22/2020, 12:33 PM  Advanced Heart Failure Team Pager [phone] (M-F; 7a - 5p)  Please contact [facility] for night-coverage after hours (4p -7a ) and weekends on [email or web address]   Patient seen and examined with the above-signed Advanced Practice Provider and/or Housestaff. I personally reviewed laboratory data, imaging studies and relevant notes. I independently examined the patient and formulated the important aspects of the plan. I have edited the note to reflect any of my changes or salient points. I have personally discussed the plan with the patient and/or family.  45 y/o male with advanced systolic HF due to NICM EF < 20%. Presents to ER with worsening SOB and 4 pound weight gain. Says overall has been pretty stable but missed his meds for 1 day and ate chicken noodle soup yesterday and developed orthopnea and wheezing.   Has been pending ICD eval but missed appt with EP recently. Not wearing ICD here. Says he wears it when he goes to bed.   Came to ER. CXR + pulmonary edema. BNP elevated  Has received 60mg  IV lasix with ~200cc of dark urine out. Still wheezing a bit on exam   General:  Lying nearly flat in bed. No resp difficulty HEENT: normal Neck: supple. JVP heard to see. Carotids 2+ bilat; no bruits. No lymphadenopathy or thryomegaly appreciated. Cor: PMI nondisplaced. Regular rate & rhythm. +s3 Lungs: clear + mild EE wheeze Abdomen: obese soft, nontender, nondistended. No hepatosplenomegaly. No bruits or masses. Good bowel sounds. Extremities: warm no cyanosis, clubbing, rash,  edema Neuro: alert & orientedx3, cranial nerves grossly intact. moves all 4 extremities w/o difficulty. Affect pleasant  On exam doesn't appear markedly volume overloaded but as prominent s3 on exam and pulmonary edema on CXR.   Will give 5mg  metolazone. If urine output doesn't pick up significantly will need admission for RHC. If urine output improves can go home with outpatient RHC and visit in HF clinic in near future. Appears to be getting close to need for advanced therapies but compliance may be an issue. D/w Dr. .   , MD  2:33 PM

## 2020-11-22 NOTE — ED Provider Notes (Signed)
[facility] EMERGENCY DEPARTMENT Provider Note   CSN: [id number] Arrival date & time: 11/22/20  0935     History No chief complaint on file.   Kyle Pearson is a 45 y.o. male.  HPI Patient presents with concern of chest pain, congestion, fatigue, diaphoresis. Patient has multiple medical issues, most notably CHF, cirrhosis. History is obtained by the patient, and chart review including documentation of echocardiogram performed last month. He notes that he has had congestion for some time, has been working with his cardiologist.  Though he missed an appointment yesterday, he has been titrating his medication under their guidance. Today, at work, he felt more weak than usual, with worsening congestion, cough, diaphoresis, and fatigue.  He had chest tightness that is persistent, seemingly worse with activity. He presents for evaluation.   Echocardiogram, earlier this year:  IMPRESSIONS   1. Left ventricular ejection fraction, by estimation, is <20%. The left  ventricle has severely decreased function. The left ventricle demonstrates  global hypokinesis. The left ventricular internal cavity size was  moderately dilated. Left ventricular  diastolic parameters are consistent with Grade II diastolic dysfunction  (pseudonormalization).   2. Right ventricular systolic function is moderately reduced. The right  ventricular size is moderately enlarged. There is moderately elevated  pulmonary artery systolic pressure. The estimated right ventricular  systolic pressure is 49.3 mmHg.   3. Left atrial size was moderately dilated.   4. Right atrial size was mildly dilated.   5. The mitral valve is normal in structure. Trivial mitral valve  regurgitation. No evidence of mitral stenosis.   6. The aortic valve is tricuspid. Aortic valve regurgitation is not  visualized. No aortic stenosis is present.   7. The inferior vena cava is dilated in size with <50% respiratory   variability, suggesting right atrial pressure of 15 mmHg.      Past Medical History:  Diagnosis Date  . CHF (congestive heart failure) [facility])     Patient Active Problem List   Diagnosis Date Noted  . OSA (obstructive sleep apnea) 11/14/2020  . Snoring 08/19/2020  . Acute exacerbation of CHF (congestive heart failure) (HCC) 05/29/2020  . Obesity, Class III, BMI 40-49.9 (morbid obesity) (HCC) 05/29/2020  . Other cirrhosis of liver (HCC) 05/29/2020  . Peritonsillar abscess 03/17/2020  . Leukocytosis 03/17/2020    Past Surgical History:  Procedure Laterality Date  . RIGHT/LEFT HEART CATH AND CORONARY ANGIOGRAPHY N/A 06/01/2020   Procedure: RIGHT/LEFT HEART CATH AND CORONARY ANGIOGRAPHY;  Surgeon: Laurey Morale, MD;  Location: [facility] INVASIVE CV LAB;  Service: Cardiovascular;  Laterality: N/A;       No family history on file.  Social History   Tobacco Use  . Smoking status: Former Smoker    Quit date: 2020    Years since quitting: 2.3  . Smokeless tobacco: Never Used  Vaping Use  . Vaping Use: Never used  Substance Use Topics  . Alcohol use: Not Currently  . Drug use: Never    Home Medications Prior to Admission medications   Medication Sig Start Date End Date Taking? Authorizing Provider  bisacodyl (DULCOLAX) 5 MG EC tablet Take 10 mg by mouth as needed for mild constipation.     [provider]  digoxin (LANOXIN) 0.125 MG tablet TAKE 1 TABLET BY MOUTH DAILY 09/04/20   Jacklynn Ganong, FNP  empagliflozin (JARDIANCE) 10 MG TABS tablet Take 1 tablet (10 mg total) by mouth daily before breakfast. 07/31/20   Laurey Morale, MD  furosemide (LASIX) 40 MG tablet Take 1 tablet (40 mg total) by mouth every morning AND 0.5 tablets (20 mg total) every evening. 09/27/20   Laurey Morale, MD  ivabradine (CORLANOR) 5 MG TABS tablet Take 1 tablet (5 mg total) by mouth 2 (two) times daily with a meal. 08/08/20   Milford, Anderson Malta, FNP  metoprolol succinate (TOPROL-XL) 50 MG  24 hr tablet Take 1.5 tablets (75 mg total) by mouth daily. 09/27/20   Laurey Morale, MD  Multiple Vitamin (MULTI-VITAMIN) tablet Take 1 tablet by mouth daily.    [provider]  potassium chloride SA (KLOR-CON) 20 MEQ tablet Take 20 mEq by mouth daily.    [provider]  sacubitril-valsartan (ENTRESTO) 97-103 MG Take 1 tablet by mouth 2 (two) times daily. 07/31/20   Laurey Morale, MD  spironolactone (ALDACTONE) 25 MG tablet Take 1 tablet (25 mg total) by mouth daily. 08/08/20   Jacklynn Ganong, FNP  thiamine 100 MG tablet TAKE 1 TABLET BY MOUTH EVERY DAY 10/11/20   Milford, Anderson Malta, FNP    Allergies    Raspberry and Penicillins  Review of Systems   Review of Systems  Constitutional:       Per HPI, otherwise negative  HENT:       Per HPI, otherwise negative  Respiratory:       Per HPI, otherwise negative  Cardiovascular:       Per HPI, otherwise negative  Gastrointestinal: Negative for vomiting.  Endocrine:       Negative aside from HPI  Genitourinary:       Neg aside from HPI   Musculoskeletal:       Per HPI, otherwise negative  Skin: Negative.   Neurological: Negative for syncope.    Physical Exam Updated Vital Signs BP 113/89   Pulse (!) 106   Temp 98.5 F (36.9 C) (Oral)   Resp 19   SpO2 95%   Physical Exam Vitals and nursing note reviewed.  Constitutional:      General: He is not in acute distress.    Appearance: He is well-developed.  HENT:     Head: Normocephalic and atraumatic.  Eyes:     Conjunctiva/sclera: Conjunctivae normal.  Cardiovascular:     Rate and Rhythm: Regular rhythm. Tachycardia present.  Pulmonary:     Breath sounds: No stridor. No wheezing.     Comments: Tachypnea Abdominal:     General: There is no distension.  Musculoskeletal:     Right lower leg: No edema.     Left lower leg: No edema.  Skin:    General: Skin is warm and dry.  Neurological:     Mental Status: He is alert and oriented to person, place,  and time.     ED Results / Procedures / Treatments   Labs (all labs ordered are listed, but only abnormal results are displayed) Labs Reviewed  BRAIN NATRIURETIC PEPTIDE - Abnormal; Notable for the following components:      Result Value   B Natriuretic Peptide 1,246.0 (*)    All other components within normal limits  BASIC METABOLIC PANEL - Abnormal; Notable for the following components:   Sodium 132 (*)    Glucose, Bld 132 (*)    Calcium 8.7 (*)    All other components within normal limits  DIGOXIN LEVEL - Abnormal; Notable for the following components:   Digoxin Level <0.2 (*)    All other components within normal limits  TROPONIN I (HIGH  SENSITIVITY) - Abnormal; Notable for the following components:   Troponin I (High Sensitivity) 40 (*)    All other components within normal limits  TROPONIN I (HIGH SENSITIVITY) - Abnormal; Notable for the following components:   Troponin I (High Sensitivity) 35 (*)    All other components within normal limits  RESP PANEL BY RT-PCR (FLU A&B, COVID) [id number]  CBC  D-DIMER, QUANTITATIVE    EKG Sinus tachycardia, rate 120, nonspecific ST wave changes, abnormal  Radiology DG Chest 2 View  Result Date: 11/22/2020 CLINICAL DATA:  Chest pain and cough over the last several weeks. EXAM: CHEST - 2 VIEW COMPARISON:  05/29/2020 FINDINGS: Chronically enlarged cardiac silhouette. Mild thoracic tortuosity. Pulmonary venous hypertension with interstitial pulmonary edema and a small amount of fluid in the fissures. No infiltrate, collapse or measurable effusion. No significant bone finding. IMPRESSION: Congestive heart failure. Enlarged cardiac silhouette. Pulmonary venous hypertension. Interstitial pulmonary edema. Small amount of fluid in the fissures. Electronically Signed   By: Paulina Fusi M.D.   On: 11/22/2020 10:24    Procedures Procedures   Medications Ordered in ED Medications  sacubitril-valsartan (ENTRESTO) 97-103 mg per tablet (1 tablet  Oral Given 11/22/20 1358)  digoxin (LANOXIN) tablet 0.125 mg (0.125 mg Oral Given 11/22/20 1358)  potassium chloride SA (KLOR-CON) CR tablet 40 mEq (40 mEq Oral Given 11/22/20 1358)  albuterol (VENTOLIN HFA) 108 (90 Base) MCG/ACT inhaler 2 puff (2 puffs Inhalation Given 11/22/20 1027)  ibuprofen (ADVIL) tablet 600 mg (600 mg Oral Given 11/22/20 1027)  ondansetron (ZOFRAN-ODT) disintegrating tablet 4 mg (4 mg Oral Given 11/22/20 1027)  furosemide (LASIX) injection 60 mg (60 mg Intravenous Given 11/22/20 1241)  metolazone (ZAROXOLYN) tablet 5 mg (5 mg Oral Given 11/22/20 1358)    ED Course  I have reviewed the triage vital signs and the nursing notes.  Pertinent labs & imaging results that were available during my care of the patient were reviewed by me and considered in my medical decision making (see chart for details).   Cardiac monitor sinus, 120s, abnormal Pulse ox 98% room air normal  Date: I discussed patient's case with her cardiology colleagues given the patient's extensive cardiac history, myopathy.  Update:, Patient has had metolazone, Lasix  3:30 PM Patient has had production of greater than 1 L of urine.  Labs, x-ray, otherwise unrevealing, presentation most consistent with acute exacerbation of congestive heart failure, systolic/diastolic. Now, after consultation with cardiology, provision of IV Lasix, metolazone, patient has appropriate response to his therapy, and with his capacity for close outpatient follow-up which has been arranged, he is going home on an increased dose of Lasix. 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, worsening   Amount and/or Complexity of Data Reviewed Clinical lab tests: ordered and reviewed Tests in the radiology section of CPT: ordered and reviewed Tests in the medicine section of CPT: reviewed and ordered Decide to obtain  previous medical records or to obtain history from someone other than the patient: yes Obtain history from someone other than the patient: yes Review and summarize past medical records: yes Discuss the patient with other providers: yes Independent visualization of images, tracings, or specimens: yes  Risk of Complications, Morbidity, and/or Mortality Presenting problems: high Diagnostic procedures: high Management options: high  Critical Care Total time providing critical care: 30-74 minutes (35)  Patient Progress Patient progress: improved  Final Clinical Impression(s) / ED Diagnoses Final diagnoses:  Acute  on chronic combined systolic and diastolic congestive heart failure (HCC)     Gerhard Munch, MD 11/22/20 [id number]

## 2020-11-29 ENCOUNTER — Encounter (HOSPITAL_COMMUNITY): Payer: Self-pay | Admitting: Cardiology

## 2020-11-29 ENCOUNTER — Other Ambulatory Visit (HOSPITAL_COMMUNITY): Payer: Self-pay

## 2020-11-29 ENCOUNTER — Telehealth (HOSPITAL_COMMUNITY): Payer: Self-pay | Admitting: Pharmacy Technician

## 2020-11-29 ENCOUNTER — Ambulatory Visit (HOSPITAL_COMMUNITY)
Admission: RE | Admit: 2020-11-29 | Discharge: 2020-11-29 | Disposition: A | Discharge status: Home / Self Care | Payer: 59 | Source: Ambulatory Visit | Attending: Cardiology | Admitting: Cardiology

## 2020-11-29 ENCOUNTER — Other Ambulatory Visit: Payer: Self-pay

## 2020-11-29 VITALS — BP 106/72 | HR 95 | Wt 266.8 lb

## 2020-11-29 DIAGNOSIS — G4733 Obstructive sleep apnea (adult) (pediatric): Secondary | ICD-10-CM | POA: Insufficient documentation

## 2020-11-29 DIAGNOSIS — E669 Obesity, unspecified: Secondary | ICD-10-CM | POA: Diagnosis not present

## 2020-11-29 DIAGNOSIS — K7460 Unspecified cirrhosis of liver: Secondary | ICD-10-CM | POA: Diagnosis not present

## 2020-11-29 DIAGNOSIS — Z79899 Other long term (current) drug therapy: Secondary | ICD-10-CM | POA: Diagnosis not present

## 2020-11-29 DIAGNOSIS — Z87891 Personal history of nicotine dependence: Secondary | ICD-10-CM | POA: Insufficient documentation

## 2020-11-29 DIAGNOSIS — R053 Chronic cough: Secondary | ICD-10-CM | POA: Diagnosis not present

## 2020-11-29 DIAGNOSIS — I428 Other cardiomyopathies: Secondary | ICD-10-CM | POA: Diagnosis not present

## 2020-11-29 DIAGNOSIS — I5022 Chronic systolic (congestive) heart failure: Secondary | ICD-10-CM | POA: Insufficient documentation

## 2020-11-29 LAB — CBC
HCT: 47.7 % (ref 39.0–52.0)
Hemoglobin: 15.9 g/dL (ref 13.0–17.0)
MCH: 29.9 pg (ref 26.0–34.0)
MCHC: 33.3 g/dL (ref 30.0–36.0)
MCV: 89.8 fL (ref 80.0–100.0)
Platelets: 271 10*3/uL (ref 150–400)
RBC: 5.31 MIL/uL (ref 4.22–5.81)
RDW: 14.7 % (ref 11.5–15.5)
WBC: 10.2 10*3/uL (ref 4.0–10.5)
nRBC: 0 % (ref 0.0–0.2)

## 2020-11-29 LAB — BASIC METABOLIC PANEL
Anion gap: 7 (ref 5–15)
BUN: 11 mg/dL (ref 6–20)
CO2: 27 mmol/L (ref 22–32)
Calcium: 8.7 mg/dL — ABNORMAL LOW (ref 8.9–10.3)
Chloride: 99 mmol/L (ref 98–111)
Creatinine, Ser: 0.67 mg/dL (ref 0.61–1.24)
GFR, Estimated: >60 mL/min (ref >60)
Glucose, Bld: 113 mg/dL — ABNORMAL HIGH (ref 70–99)
Potassium: 4.1 mmol/L (ref 3.5–5.1)
Sodium: 133 mmol/L — ABNORMAL LOW (ref 135–145)

## 2020-11-29 MED ORDER — FUROSEMIDE 40 MG PO TABS: 40.0000 mg | ORAL_TABLET | Freq: Two times a day (BID) | ORAL | 11 refills | Status: DC

## 2020-11-29 MED ORDER — METOPROLOL SUCCINATE ER 100 MG PO TB24: 100.0000 mg | ORAL_TABLET | Freq: Every day | ORAL | 11 refills | Status: DC

## 2020-11-29 NOTE — H&P (View-Only) (Signed)
Advanced Heart Failure Clinic Note  PCP: [facility] HF physician: Dr. Shirlee Latch  HPI: Kyle Pearson is a 45 y.o. with history of tobacco and alcohol use, diagnosed with CHF in 10/21.    Prior to 11/21, [name] reports no past medical history.  He used to smoke less than 1/2 pack per day and drank about 6 pack of beer on the weekend.  He denies family history of heart disease but reported his father used to drink alcohol heavily but quit as well.  He states in 03/16/2020 he started to develop a cough and didn't want to mix medication with alcohol so he stopped drinking and started to take cough medication.  He noticed one day a very severe pain on his left side and pain with swallowing.  He was diagnosed with peritonsillar abscess and placed on clindamycin (300 mg po QID x 10 days) and decadron.  He reports feeling better throat wise but noticed increase swelling to his abdomen and difficulty breathing.  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  He continued to experience trouble breathing and walking short distances were difficulty.    He went to [facility] on 04/24/2020 for shortness of breath, abdominal distention and left sided chest pain. 2D echo showed EF 15-20% with severe LVH, BNP 562 and troponin 54.  He was started on lisinopril 5 mg, toprol-XL 25 mg PO daily and lasix IV. Blood pressure was low and required albumin 12.5 mg IV x 3 doses to help with BP.  He also had an abdominal ultrasound which showed no significant asites but note mild liver cirrhosis with mild gallbladder wall thickening. He was discharged home on 40 mg PO lasix, KCL 20 mEq, lisinopril 5 and toprol-XL 25.    He presented to 1st PCP appt on 05/29/20 and was found to have shortness of breath, bilaterally lower extremity edema and abdominal distention.  Reporting 30lb weight gain despite compliance with medication.  He was sent to [facility] ,  where his BNP was [facility] BNP 562), CXR showed cardiomegaly only.  Repeat echo revealed EF < 20%, severe decrease function with global hypokinesis, grade III DD, dilated LA/RA and small pericardial effusion. He was admitted 05/29/20-06/06/20 with marked volume overload. Diuresed with IV lasix and set for cath. Cath showed elevated filling pressures and low ouput. Milrinone was added and he continued to diurese on IV. Milrinone later weaned off with stable mixed venous saturation. Started on GDMT with 4 corner stone HF meds. Had cMRI with severely reduced EF and non-coronary pattern extensive scar. Due to concern for ectopy from scar, he was discharged with Lifevest.  Diuresed 35 lbs. Discharge weight 255.6 lbs.  Echo 3/22 showed that EF remains < 20%, moderate LVE, moderate RVE, mildly decreased RV systolic function, IVC dilated.  CPX in 4/22 showed only a mild HF limitation.   He returns today for followup of CHF.  No ETOH or smoking.  He was seen in the ER on 11/22/20 with exertional dyspnea.  He says that he had missed "1 day" of his medications. He was given IV Lasix and metolazone and felt much better, sent home.  He continues to wear Lifevest sporadically (mainly at night). He says that his breathing is better than prior to going to the ER, main complaint is chronic cough, wheezing, congestion.  No dyspnea except with heavy exertion.  No chest pain.  No orthopnea/PND. He continues to work full time.    ECG (personally reviewed, 5/22): NSR, right axis deviation, poor RWP.   Labs (2/22): digoxin 0.3, K 4.3, creatinine 0.85 Labs (5/22): digoxin < 0.2, K 3.7, creatinine 0.87, BNP 1246  PMH: 1. Prior smoker. 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  HIV negative.  No strong family history. Prior heavy ETOH but quit in 9/21.   - Echo (11/21): EF < 20%, severe LV dilation, severe RVE and severely decreased RV systolic function, severe biatrial enlargement.  -  Cardiac MRI (11/21): Severely dilated LV with diffuse hypokinesis, EF 17%., mod dilated RV with EF 20%, diffuse mid-wall LGE in septum, inferior wall & inferolateral wall - LHC/RHC (12/21): No significant CAD; mean RA 29, PA 69/32 mean 49, mean PCWP 43, CI 2.1 (Fick) and 1.7 (thermo), PVR 1.22 - Echo (3/22): EF < 20%, moderate LVE, moderate RVE, mildly decreased RV systolic function, IVC dilated.  - CPX (4/22): Peak VO2 20.2, VE/VCO2 slope 33, RER 1.07.  Mild HF limitation.  4. OSA  ROS: All systems negative except as listed in HPI, PMH and Problem List.  SH:  Social History   Socioeconomic History  . Marital status: Single    Spouse name: Not on file  . Number of children: Not on file  . Years of education: Not on file  . Highest education level: Not on file  Occupational History  . Not on file  Tobacco Use  . Smoking status: Former Smoker    Quit date: 2020    Years since quitting: 2.4  . Smokeless tobacco: Never Used  Vaping Use  . Vaping Use: Never used  Substance and Sexual Activity  . Alcohol use: Not Currently  . Drug use: Never  . Sexual activity: Not on file  Other Topics Concern  . Not on file  Social History Narrative  . Not on file   Social Determinants of Health   Financial Resource Strain: Not on file  Food Insecurity: Not on file  Transportation Needs: Not on file  Physical Activity: Not on file  Stress: Not on file  Social Connections: Not on file  Intimate Partner Violence: Not on file    FH: No family history on file.   Current Outpatient Medications  Medication Sig Dispense Refill  . bisacodyl (DULCOLAX) 5 MG EC tablet Take 10 mg by mouth as needed for mild constipation.     . digoxin (LANOXIN) 0.125 MG tablet TAKE 1 TABLET BY MOUTH DAILY 90 tablet 3  . empagliflozin (JARDIANCE) 10 MG TABS tablet Take 1 tablet (10 mg total) by mouth daily before breakfast. 90 tablet 3  . ivabradine (CORLANOR) 5 MG TABS tablet Take 1 tablet (5 mg total) by  mouth 2 (two) times daily with a meal. 180 tablet 3  . Multiple Vitamin (MULTI-VITAMIN) tablet Take 1 tablet by mouth daily.    . sacubitril-valsartan (ENTRESTO) 97-103 MG Take 1 tablet by mouth 2 (two) times daily. 180 tablet 3  . spironolactone (ALDACTONE) 25 MG tablet Take 1 tablet (25 mg total) by mouth daily. 30 tablet 6  . thiamine 100 MG tablet TAKE 1 TABLET BY MOUTH EVERY DAY 30 tablet 11  . furosemide (LASIX) 40 MG tablet Take 1 tablet (40 mg total) by mouth 2 (two) times daily. 90 tablet 11  . metoprolol succinate (TOPROL-XL) 100 MG 24 hr tablet Take 1 tablet (100 mg total) by mouth daily. 30 tablet 11   No current facility-administered medications  for this encounter.   Vitals:   11/29/20 0936  BP: 106/72  Pulse: 95  SpO2: 97%  Weight: 121 kg (266 lb 12.8 oz)     Wt Readings from Last 3 Encounters:  11/29/20 121 kg (266 lb 12.8 oz)  11/14/20 117.9 kg (260 lb)  09/27/20 122.2 kg (269 lb 6.4 oz)    PHYSICAL EXAM: General: NAD Neck: JVP 8 cm with HJR, no thyromegaly or thyroid nodule.  Lungs: Clear to auscultation bilaterally with normal respiratory effort. CV: Lateral PMI.  Heart regular S1/S2, soft S3, no murmur.  No peripheral edema.  No carotid bruit.  Normal pedal pulses.  Abdomen: Soft, nontender, no hepatosplenomegaly, no distention.  Skin: Intact without lesions or rashes.  Neurologic: Alert and oriented x 3.  Psych: Normal affect. Extremities: No clubbing or cyanosis.  HEENT: Normal.   ASSESSMENT & PLAN: 1. Chronic systolic CHF: Nonischemic cardiomyopathy. Symptomatic since 9/21 (after episode of peri-tonsillar abscess). He was a prior drinker (none since 9/21) but does not seem to describe heavy enough ETOH to cause profound cardiomyopathy (though he did have mild cirrhosis on abdominal US at [facility]). No FH of cardiomyopathy. Born in [place], has been in [place] x many years. HIV negative 9/21. Echo 11/21 with EF <20% with severe LV dilation, moderate RVE with  severely decreased systolic function, severe biatrial enlargement, mild MR. RHC/LHC 12/21 showed no coronary disease, markedly elevated filling pressures, and low cardiac output. Cardiac MRI in 11/21 showed severely dilated LV with diffuse hypokinesis, EF 17%, mod dilated RV with EF 20%, diffuse mid-wall LGE in septum, inferior wall & inferolateral wall.  LGE pattern suggests possible prior myocarditis. Echo in 3/22 showed that EF remains < 20% with mild RV dysfunction.  CPX in 4/22 showed only a mild HF limitation.  Seen in the ER 11/22/20 (says he missed only 1 day of medications), got IV diuresis.  I suspect that his cough/congestion may be related to volume overload.  On exam, he is mildly volume overloaded and has soft S3.   - Continue Entresto to 97/103 mg bid.  -Increase Lasix to 40 mg bid with BMET today and in 10 days.  - Continue digoxin 0.125 daily, check level today.  - Increase Toprol XL to 100 mg daily.  - Continue spironolactone 25 mg daily.  - Continue Dapagliflozin 10 mg daily.  -Continueivabradine 5 mg bid.  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  - He has an appointment with EP to discuss ICD.  He is not a CRT candidate.  Once ICD is placed, can stop Lifevest.  - I am going to arrange for RHC to reassess filling pressures and cardiac output, we discussed risks/benefits and he agrees to procedure.  2. ETOH: He did have early cirrhosis on abdominal US at [facility]. However, with obesity could be NASH. No further ETOH (abstinence does not appear to have helped LV EF, doubt ETOH is the major cause for low EF). 3. OSA: Needs CPAP, still waiting.   Followup with APP in 4 wks.   Marca Ancona,  11/29/20

## 2020-11-29 NOTE — Telephone Encounter (Signed)
Advanced Heart Failure Patient Advocate Encounter  I received a message while the patient was in office today regarding a concern with Entresto and Corlanor. Upon investigation, the patient's insurance has slightly changed. Was able to confirm that both Corlanor and Entresto went through with no problems. Also confirmed that the previous co-pay cards on file are still active.   Archer Asa, CPhT

## 2020-11-29 NOTE — Addendum Note (Signed)
Encounter addended by: Marisa Hua, RN on: 11/29/2020 11:00 AM  Actions taken: Clinical Note Signed

## 2020-11-29 NOTE — Addendum Note (Signed)
Encounter addended by: Marisa Hua, RN on: 11/29/2020 11:15 AM  Actions taken: Clinical Note Signed, Order list changed, Diagnosis association updated

## 2020-11-29 NOTE — Procedures (Signed)
  Patient Name: Kyle Pearson, Kyle Pearson Date: 11/14/2020 Gender: Male D.O.B: 10/04/1975 Age (years): 62 Referring Provider: [name] FNP Height (inches): 71 Interpreting Physician: Armanda Magic MD, ABSM Weight (lbs): 260 RPSGT: Shelah Lewandowsky BMI: 36 MRN: [id number] Neck Size: 16.50  CLINICAL INFORMATION The patient is referred for a CPAP titration to treat sleep apnea.  SLEEP STUDY TECHNIQUE As per the AASM Manual for the Scoring of Sleep and Associated Events v2.3 (April 2016) with a hypopnea requiring 4% desaturations.  The channels recorded and monitored were frontal, central and occipital EEG, electrooculogram (EOG), submentalis EMG (chin), nasal and oral airflow, thoracic and abdominal wall motion, anterior tibialis EMG, snore microphone, electrocardiogram, and pulse oximetry. Continuous positive airway pressure (CPAP) was initiated at the beginning of the study and titrated to treat sleep-disordered breathing.  MEDICATIONS Medications self-administered by patient taken the night of the study : N/A  TECHNICIAN COMMENTS Comments added by technician: NONE Comments added by scorer: N/A  RESPIRATORY PARAMETERS Optimal PAP Pressure (cm): 14  AHI at Optimal Pressure (/hr):0 Overall Minimal O2 (%):86.0  Supine % at Optimal Pressure (%):0 Minimal O2 at Optimal Pressure (%): 92.0   SLEEP ARCHITECTURE The study was initiated at 10:04:30 PM and ended at 5:34:09 AM.  Sleep onset time was 38.5 minutes and the sleep efficiency was 85.7%. The total sleep time was 385.5 minutes.  The patient spent 7.5% of the night in stage N1 sleep, 49.3% in stage N2 sleep, 0.0%% in stage N3 and 43.2% in REM.Stage REM latency was 24.5 minutes  Wake after sleep onset was 25.7. Alpha intrusion was absent. Supine sleep was 67.44%.  CARDIAC DATA The 2 lead EKG demonstrated sinus rhythm. The mean heart rate was 57.6 beats per minute. Other EKG findings include: PVCs and nonsustained  ventricular tachycardia.  LEG MOVEMENT DATA The total Periodic Limb Movements of Sleep (PLMS) were 0. The PLMS index was 0.0. A PLMS index of <15 is considered normal in adults.  IMPRESSIONS - The optimal PAP pressure was 14 cm of water. - Moderate oxygen desaturations were observed during this titration (min O2 = 86.0%). - No snoring was audible during this study. - 2-lead EKG demonstrated: PVCs and nonsustained ventricular tachycardia - Clinically significant periodic limb movements were not noted during this study. Arousals associated with PLMs were rare.  DIAGNOSIS - Obstructive Sleep Apnea (G47.33)  RECOMMENDATIONS - Trial of CPAP therapy on 14 cm H2O with a Medium size Fisher&Paykel Full Face Mask F&P Vitera (new) mask and heated humidification. - Avoid alcohol, sedatives and other CNS depressants that may worsen sleep apnea and disrupt normal sleep architecture. - Sleep hygiene should be reviewed to assess factors that may improve sleep quality. - Weight management and regular exercise should be initiated or continued. - Return to Sleep Center for re-evaluation after 6 weeks of therapy  [Electronically signed] 11/29/2020 01:08 PM  Armanda Magic MD, ABSM Diplomate, American Board of Sleep Medicine

## 2020-11-29 NOTE — Progress Notes (Signed)
Advanced Heart Failure Clinic Note  PCP: [facility] HF physician: Dr. Shirlee Latch  HPI: Kyle Pearson is a 45 y.o. with history of tobacco and alcohol use, diagnosed with CHF in 10/21.    Prior to 11/21, Kyle Pearson reports no past medical history.  He used to smoke less than 1/2 pack per day and drank about 6 pack of beer on the weekend.  He denies family history of heart disease but reported his father used to drink alcohol heavily but quit as well.  He states in 03/16/2020 he started to develop a cough and didn't want to mix medication with alcohol so he stopped drinking and started to take cough medication.  He noticed one day a very severe pain on his left side and pain with swallowing.  He was diagnosed with peritonsillar abscess and placed on clindamycin (300 mg po QID x 10 days) and decadron.  He reports feeling better throat wise but noticed increase swelling to his abdomen and difficulty breathing.  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  He continued to experience trouble breathing and walking short distances were difficulty.    He went to [facility] on 04/24/2020 for shortness of breath, abdominal distention and left sided chest pain. 2D echo showed EF 15-20% with severe LVH, BNP 562 and troponin 54.  He was started on lisinopril 5 mg, toprol-XL 25 mg PO daily and lasix IV. Blood pressure was low and required albumin 12.5 mg IV x 3 doses to help with BP.  He also had an abdominal ultrasound which showed no significant asites but note mild liver cirrhosis with mild gallbladder wall thickening. He was discharged home on 40 mg PO lasix, KCL 20 mEq, lisinopril 5 and toprol-XL 25.    He presented to 1st PCP appt on 05/29/20 and was found to have shortness of breath, bilaterally lower extremity edema and abdominal distention.  Reporting 30lb weight gain despite compliance with medication.  He was sent to [facility] ,  where his BNP was [address] [facility] BNP 562), CXR showed cardiomegaly only.  Repeat echo revealed EF < 20%, severe decrease function with global hypokinesis, grade III DD, dilated LA/RA and small pericardial effusion. He was admitted 05/29/20-06/06/20 with marked volume overload. Diuresed with IV lasix and set for cath. Cath showed elevated filling pressures and low ouput. Milrinone was added and he continued to diurese on IV. Milrinone later weaned off with stable mixed venous saturation. Started on GDMT with 4 corner stone HF meds. Had cMRI with severely reduced EF and non-coronary pattern extensive scar. Due to concern for ectopy from scar, he was discharged with Lifevest.  Diuresed 35 lbs. Discharge weight 255.6 lbs.  Echo 3/22 showed that EF remains < 20%, moderate LVE, moderate RVE, mildly decreased RV systolic function, IVC dilated.  CPX in 4/22 showed only a mild HF limitation.   He returns today for followup of CHF.  No ETOH or smoking.  He was seen in the ER on 11/22/20 with exertional dyspnea.  He says that he had missed "1 day" of his medications. He was given IV Lasix and metolazone and felt much better, sent home.  He continues to wear Lifevest sporadically (mainly at night). He says that his breathing is better than prior to going to the ER, main complaint is chronic cough, wheezing, congestion.  No dyspnea except with heavy exertion.  No chest pain.  No orthopnea/PND. He continues to work full time.    ECG (personally reviewed, 5/22): NSR, right axis deviation, poor RWP.   Labs (2/22): digoxin 0.3, K 4.3, creatinine 0.85 Labs (5/22): digoxin < 0.2, K 3.7, creatinine 0.87, BNP 1246  PMH: 1. Prior smoker. 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  HIV negative.  No strong family history. Prior heavy ETOH but quit in 9/21.   - Echo (11/21): EF < 20%, severe LV dilation, severe RVE and severely decreased RV systolic function, severe biatrial enlargement.  -  Cardiac MRI (11/21): Severely dilated LV with diffuse hypokinesis, EF 17%., mod dilated RV with EF 20%, diffuse mid-wall LGE in septum, inferior wall & inferolateral wall - LHC/RHC (12/21): No significant CAD; mean RA 29, PA 69/32 mean 49, mean PCWP 43, CI 2.1 (Fick) and 1.7 (thermo), PVR 1.22 - Echo (3/22): EF < 20%, moderate LVE, moderate RVE, mildly decreased RV systolic function, IVC dilated.  - CPX (4/22): Peak VO2 20.2, VE/VCO2 slope 33, RER 1.07.  Mild HF limitation.  4. OSA  ROS: All systems negative except as listed in HPI, PMH and Problem List.  SH:  Social History   Socioeconomic History  . Marital status: Single    Spouse name: Not on file  . Number of children: Not on file  . Years of education: Not on file  . Highest education level: Not on file  Occupational History  . Not on file  Tobacco Use  . Smoking status: Former Smoker    Quit date: 2020    Years since quitting: 2.4  . Smokeless tobacco: Never Used  Vaping Use  . Vaping Use: Never used  Substance and Sexual Activity  . Alcohol use: Not Currently  . Drug use: Never  . Sexual activity: Not on file  Other Topics Concern  . Not on file  Social History Narrative  . Not on file   Social Determinants of Health   Financial Resource Strain: Not on file  Food Insecurity: Not on file  Transportation Needs: Not on file  Physical Activity: Not on file  Stress: Not on file  Social Connections: Not on file  Intimate Partner Violence: Not on file    FH: No family history on file.   Current Outpatient Medications  Medication Sig Dispense Refill  . bisacodyl (DULCOLAX) 5 MG EC tablet Take 10 mg by mouth as needed for mild constipation.     . digoxin (LANOXIN) 0.125 MG tablet TAKE 1 TABLET BY MOUTH DAILY 90 tablet 3  . empagliflozin (JARDIANCE) 10 MG TABS tablet Take 1 tablet (10 mg total) by mouth daily before breakfast. 90 tablet 3  . ivabradine (CORLANOR) 5 MG TABS tablet Take 1 tablet (5 mg total) by  mouth 2 (two) times daily with a meal. 180 tablet 3  . Multiple Vitamin (MULTI-VITAMIN) tablet Take 1 tablet by mouth daily.    . sacubitril-valsartan (ENTRESTO) 97-103 MG Take 1 tablet by mouth 2 (two) times daily. 180 tablet 3  . spironolactone (ALDACTONE) 25 MG tablet Take 1 tablet (25 mg total) by mouth daily. 30 tablet 6  . thiamine 100 MG tablet TAKE 1 TABLET BY MOUTH EVERY DAY 30 tablet 11  . furosemide (LASIX) 40 MG tablet Take 1 tablet (40 mg total) by mouth 2 (two) times daily. 90 tablet 11  . metoprolol succinate (TOPROL-XL) 100 MG 24 hr tablet Take 1 tablet (100 mg total) by mouth daily. 30 tablet 11   No current facility-administered medications  for this encounter.   Vitals:   11/29/20 0936  BP: 106/72  Pulse: 95  SpO2: 97%  Weight: 121 kg (266 lb 12.8 oz)     Wt Readings from Last 3 Encounters:  11/29/20 121 kg (266 lb 12.8 oz)  11/14/20 117.9 kg (260 lb)  09/27/20 122.2 kg (269 lb 6.4 oz)    PHYSICAL EXAM: General: NAD Neck: JVP 8 cm with HJR, no thyromegaly or thyroid nodule.  Lungs: Clear to auscultation bilaterally with normal respiratory effort. CV: Lateral PMI.  Heart regular S1/S2, soft S3, no murmur.  No peripheral edema.  No carotid bruit.  Normal pedal pulses.  Abdomen: Soft, nontender, no hepatosplenomegaly, no distention.  Skin: Intact without lesions or rashes.  Neurologic: Alert and oriented x 3.  Psych: Normal affect. Extremities: No clubbing or cyanosis.  HEENT: Normal.   ASSESSMENT & PLAN: 1. Chronic systolic CHF: Nonischemic cardiomyopathy. Symptomatic since 9/21 (after episode of peri-tonsillar abscess). He was a prior drinker (none since 9/21) but does not seem to describe heavy enough ETOH to cause profound cardiomyopathy (though he did have mild cirrhosis on abdominal US at [facility]). No FH of cardiomyopathy. Born in [place], has been in [place] x many years. HIV negative 9/21. Echo 11/21 with EF <20% with severe LV dilation, moderate RVE with  severely decreased systolic function, severe biatrial enlargement, mild MR. RHC/LHC 12/21 showed no coronary disease, markedly elevated filling pressures, and low cardiac output. Cardiac MRI in 11/21 showed severely dilated LV with diffuse hypokinesis, EF 17%, mod dilated RV with EF 20%, diffuse mid-wall LGE in septum, inferior wall & inferolateral wall.  LGE pattern suggests possible prior myocarditis. Echo in 3/22 showed that EF remains < 20% with mild RV dysfunction.  CPX in 4/22 showed only a mild HF limitation.  Seen in the ER 11/22/20 (says he missed only 1 day of medications), got IV diuresis.  I suspect that his cough/congestion may be related to volume overload.  On exam, he is mildly volume overloaded and has soft S3.   - Continue Entresto to 97/103 mg bid.  -Increase Lasix to 40 mg bid with BMET today and in 10 days.  - Continue digoxin 0.125 daily, check level today.  - Increase Toprol XL to 100 mg daily.  - Continue spironolactone 25 mg daily.  - Continue Dapagliflozin 10 mg daily.  -Continueivabradine 5 mg bid.  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  - He has an appointment with EP to discuss ICD.  He is not a CRT candidate.  Once ICD is placed, can stop Lifevest.  - I am going to arrange for RHC to reassess filling pressures and cardiac output, we discussed risks/benefits and he agrees to procedure.  2. ETOH: He did have early cirrhosis on abdominal US at [facility]. However, with obesity could be NASH. No further ETOH (abstinence does not appear to have helped LV EF, doubt ETOH is the major cause for low EF). 3. OSA: Needs CPAP, still waiting.   Followup with APP in 4 wks.   Marca Ancona,  11/29/20

## 2020-11-29 NOTE — Addendum Note (Signed)
Encounter addended by: Laurey Morale, MD on: 11/29/2020 11:55 PM  Actions taken: Clinical Note Signed, Level of Service modified

## 2020-11-29 NOTE — Patient Instructions (Addendum)
INCREASE LASIX to 40mg  (1 tab) twice a day  INCREASE TOPROL XL to 100mg  (1 tab) daily  Labs today and repeat next week on day of your procedure We will only contact you if something comes back abnormal or we need to make some changes. Otherwise no news is good news!  Your physician recommends that you schedule a follow-up appointment in: 1 month with the Nurse Practitioner/ Physician Assistant   [facility] [address] [facility] [place] [facility] [place] Dept: [phone] Loc: [phone]  Kyle Pearson  11/29/2020  You are scheduled for a Cardiac Catheterization on Friday, June 10 with Dr. 01/29/2021.  1. Please arrive at the [facility] (Main Entrance A) at [facility]: [address] [place], [address] [place] at 10:00 AM (This time is two hours before your procedure to ensure your preparation). Free valet parking service is available.   Special note: Every effort is made to have your procedure done on time. Please understand that emergencies sometimes delay scheduled procedures.  2. Diet: Do not eat or drink after midnight  3. Labs: Done today in office  4. Medication instructions in preparation for your procedure:   Contrast Allergy: NO   HOLD Vitamins, Jardiance, Lasix and Spironolactone on the morning of the procedure  5. Plan for one night stay--bring personal belongings. 6. Bring a current list of your medications and current insurance cards. 7. You MUST have a responsible person to drive you home. 8. Someone MUST be with you the first 24 hours after you arrive home or your discharge will be delayed. 9. Please wear clothes that are easy to get on and off and wear slip-on shoes.  Thank you for allowing [place] to care for you!   -- [facility]

## 2020-11-30 ENCOUNTER — Ambulatory Visit (INDEPENDENT_AMBULATORY_CARE_PROVIDER_SITE_OTHER): Payer: 59 | Admitting: Cardiology

## 2020-11-30 ENCOUNTER — Encounter: Payer: Self-pay | Admitting: Cardiology

## 2020-11-30 VITALS — BP 100/60 | HR 94 | Ht 66.0 in | Wt 263.0 lb

## 2020-11-30 DIAGNOSIS — Z01812 Encounter for preprocedural laboratory examination: Secondary | ICD-10-CM

## 2020-11-30 DIAGNOSIS — Z01818 Encounter for other preprocedural examination: Secondary | ICD-10-CM

## 2020-11-30 DIAGNOSIS — I428 Other cardiomyopathies: Secondary | ICD-10-CM

## 2020-11-30 LAB — MISC LABCORP TEST (SEND OUT): Labcorp test code: 94643

## 2020-11-30 NOTE — Patient Instructions (Signed)
Medication Instructions:  Your physician recommends that you continue on your current medications as directed. Please refer to the Current Medication list given to you today.  *If you need a refill on your cardiac medications before your next appointment, please call your pharmacy*   Lab Work: None ordered If you have labs (blood work) drawn today and your tests are completely normal, you will receive your results only by: Marland Kitchen MyChart Message (if you have MyChart) OR . A paper copy in the mail If you have any lab test that is abnormal or we need to change your treatment, we will call you to review the results.   Testing/Procedures: Your physician has recommended that you have a defibrillator inserted. An implantable cardioverter defibrillator (ICD) is a small device that is placed in your chest or, in rare cases, your abdomen. This device uses electrical pulses or shocks to help control life-threatening, irregular heartbeats that could lead the heart to suddenly stop beating (sudden cardiac arrest). Leads are attached to the ICD that goes into your heart. This is done in the hospital and usually requires an overnight stay. Please see the instruction sheet below located under "other instructions".    Follow-Up: At [facility], you and your health needs are our priority.  As part of our continuing mission to provide you with exceptional heart care, we have created designated Provider Care Teams.  These Care Teams include your primary Cardiologist (physician) and Advanced Practice Providers (APPs -  Physician Assistants and Nurse Practitioners) who all work together to provide you with the care you need, when you need it.  Your next appointment:   2 week(s) after your defibrillator implant  The format for your next appointment:   In Person  Provider:   device clinic for a wound check    Thank you for choosing [facility]!!   Dory Horn, RN [phone]   Other  Instructions   Implantable Device Instructions  You are scheduled for: subcutaneous implantable cardiac defibrillator on 01/26/2021 with Dr. Elberta Fortis.  1.   Pre procedure testing-             LAB WORK--- On 01/15/2021  for your pre procedure blood work.  You do NOT need to be fasting.  2. On the day of your procedure 01/26/2021 you will go to [facility] [phone]) at 7:30 am.  Bonita Quin will go to the main entrance A [employer]) and enter where the [employer] are.  You will check in at ADMITTING.  You may have one support person come in to the hospital with you.  They will be asked to wait in the waiting room.   3.   Do not eat or drink after midnight prior to your procedure.   4.   On the morning of your procedure do NOT take any medication.  5.  The night before your procedure and the morning of your procedure scrub your neck/chest with surgical scrub.  See instruction letter below.   5.  Plan for an overnight stay, but you may be discharged home after your procedure. If you use your phone frequently bring your phone charger, in case you have to stay.  If you are discharged after your procedure you will need someone to drive you home and be with your for 24 hours after your procedure.   6.  You will follow up with the [facility] Device clinic 10-14 days after your procedure. You will follow up  with Dr. Elberta Fortis 91 days after your procedure.  These appointments will be made for you.   * If you have ANY questions after you get home, please call the office [phone] and ask for Jesilyn Easom RN or send a MyChart message.   [place] - Preparing For Surgery (surgial scrub)  Before surgery, you can play an important role. Because skin is not sterile, your skin needs to be as free of germs as possible. You can reduce the number of germs on your skin by washing with CHG (chlorahexidine gluconate) Soap before surgery.  CHG is an antiseptic cleaner which kills germs and bonds  with the skin to continue killing germs even after washing.   Please do not use if you have an allergy to CHG or antibacterial soaps.  If your skin becomes reddened/irritated stop using the CHG.   Do not shave (including legs and underarms) for at least 48 hours prior to first CHG shower.  It is OK to shave your face.  Please follow these instructions carefully:  1.  Shower the night before surgery and the morning of surgery with CHG.  2.  If you choose to wash your hair, wash your hair first as usual with your normal shampoo.  3.  After you shampoo, rinse your hair and body thoroughly to remove the shampoo.  4.  Use CHG as you would any other liquid soap.  You can apply CHG directly to the skin and wash gently with a clean washcloth. 5.  Apply the CHG Soap to your body ONLY FROM THE NECK DOWN.  Do not use on open wounds or open sores.  Avoid contact with your eyes, ears, mouth and genitals (private parts).  Wash genitals (private parts) with your normal soap.  6.  Wash thoroughly, paying special attention to the area where your surgery will be performed.  7.  Thoroughly rinse your body with warm water from the neck down.   8.  DO NOT shower/wash with your normal soap after using and rinsing off the CHG soap.  9.  Pat yourself dry with a clean towel.           10.  Wear clean pajamas.           11.  Place clean sheets on your bed the night of your first shower and do not sleep with pets.  Day of Surgery: Do not apply any deodorants/lotions.  Please wear clean clothes to the hospital/surgery center.    Subcutaneous Cardioverter Defibrillator Implantation A subcutaneous implantable cardioverter defibrillator (S-ICD) is a device that identifies and corrects abnormal heart rhythms. Subcutaneous cardioverter defibrillator implantation is a surgery to place an S-ICD under the skin in your chest. An S-ICD has a battery, a small computer (pulse generator), and a device that moves electrical currents  (electrode). The S-ICD detects and corrects two types of dangerous irregular heart rhythms (arrhythmias):  A rapid heart rhythm in the lower chambers of the heart (ventricles). This is called ventricular tachycardia.  The ventricles contracting in an uncoordinated way. This is called ventricular fibrillation. When the S-ICD detects ventricular tachycardia or fibrillation, it sends a shock to the heart that attempts to restore the heartbeat to normal (defibrillation). The shock may feel like a strong jolt in the chest. Your health care provider may recommend S-ICD implantation if:  You had an arrhythmia that started in the ventricles.  Your heart has damage from heart disease or a heart condition.  Your heart muscle is  weak.  You have congenital heart disease. Your health care provider may recommend implantation of an S-ICD instead of a traditional ICD if:  You have a blood vessel disorder (vascular disease).  You have medical conditions that increase your risk for infection or you have had a prior ICD infection.  You do not need a pacemaker.  You are younger in age and are expected to have a long-term need for defibrillation. Talk with your health care provider about the benefits of an S-ICD. Tell a health care provider about:  Any allergies you have.  All medicines you are taking, including vitamins, herbs, eye drops, creams, and over-the-counter medicines.  Any problems you or family members have had with anesthetic medicines.  Any blood disorders you have.  Any surgeries you have had.  Any medical conditions you have.  Whether you are pregnant or may be pregnant. What are the risks? Generally, this is a safe procedure. However, problems may occur, including:  Infection.  Bleeding.  Allergic reactions to medicines or dyes.  Failure to shock or correct the arrhythmia.  Swelling and bruising.  Blood clots. What happens before the procedure? Staying  hydrated Follow instructions from your health care provider about hydration, which may include:  Up to 2 hours before the procedure - you may continue to drink clear liquids, such as water, clear fruit juice, black coffee, and plain tea.   Eating and drinking restrictions Follow instructions from your health care provider about eating and drinking, which may include:  8 hours before the procedure - stop eating heavy meals or foods, such as meat, fried foods, or fatty foods.  6 hours before the procedure - stop eating light meals or foods, such as toast or cereal.  6 hours before the procedure - stop drinking milk or drinks that contain milk.  2 hours before the procedure - stop drinking clear liquids. Medicines Ask your health care provider about:  Changing or stopping your regular medicines. This is especially important if you are taking diabetes medicines or blood thinners.  Taking medicines such as aspirin and ibuprofen. These medicines can thin your blood. Do not take these medicines unless your health care provider tells you to take them.  Taking over-the-counter medicines, vitamins, herbs, and supplements. Tests You may have tests, such as:  Blood tests.  A test to check the electrical signals in your heart (electrocardiogram, ECG).  Imaging tests, such as a chest X-ray. General instructions  Do not use any products that contain nicotine or tobacco for at least 4 weeks before the procedure. These products include cigarettes, chewing tobacco, and vaping devices, such as e-cigarettes. If you need help quitting, ask your health care provider.  Ask your health care provider: ? How your procedure site will be marked. ? What steps will be taken to help prevent infection. These may include:  Removing hair at the surgery site.  Washing skin with a germ-killing soap.  Taking antibiotic medicine.  Plan to have a responsible adult care for you for the time you are told after  you leave the hospital or clinic. This is important. What happens during the procedure?  Small monitors will be put on your body. They will be used to check your heart, blood pressure, and oxygen level.  An IV will be inserted into one of your veins.  You will be given one or more of the following: ? A medicine to help you relax (sedative). ? A medicine to numb the area (local anesthetic). ?  A medicine to make you fall asleep (general anesthetic).  A small incision will be made on the left side of your chest near your rib cage, under your arm.  A pocket (pouch) will be made in the skin on the left lower part of your chest. The S-ICD pulse generator will be inserted into this pocket.  An electrode will be placed under the skin in your chest, near your breastbone (sternum).  The electrode will be attached to the S-ICD pulse generator.  The S-ICD will be tested, and your health care provider will program the S-ICD for the condition being treated.  Your incision will be closed with stitches (sutures) or glue and adhesive strips.  A bandage (dressing) may be placed over your incision. The procedure may vary among health care providers and hospitals. What happens after the procedure?  Your blood pressure, heart rate, breathing rate, and blood oxygen level will be monitored until you leave the hospital or clinic.  You will be given pain medicine as needed.  You may have a chest X-ray to check the S-ICD.  You will get an identification card explaining that you have an S-ICD. You will need to keep this card with you at all times.  You will be given a remote home monitoring device to use with your S-ICD to allow your device to communicate with your clinic.  Do not drive until your health care provider approves. Summary  A subcutaneous cardioverter defibrillator implantation is a type of surgery to implant a device that corrects dangerous irregular heart rhythms (arrhythmias).  During  the surgery, a subcutaneous implantable cardioverter defibrillator (S-ICD) is placed under the skin in the chest.  The S-ICD electrode rests in the chest, but the electrode does not directly attach to the heart or blood vessels. This information is not intended to replace advice given to you by your health care provider. Make sure you discuss any questions you have with your health care provider. Document Revised: 12/15/2019 Document Reviewed: 12/15/2019 Elsevier Patient Education  2021 [employer].

## 2020-11-30 NOTE — Progress Notes (Signed)
Electrophysiology Office Note   Date:  11/30/2020   ID:  Kyle Pearson, DOB 1975-08-31, MRN [id number]  PCP:  Patient, No Pcp Per (Inactive)  Cardiologist:  Kyle Pearson Primary Electrophysiologist:  Kyle Meidinger Jorja Loa, MD    Chief Complaint: CHF   History of Present Illness: Kyle Pearson is a 45 y.o. male who is being seen today for the evaluation of CHF at the request of Kyle Morale, MD. Presenting today for electrophysiology evaluation.  He has a history significant for tobacco abuse, alcohol abuse, and CHF.  He presented to [facility] 04/24/2020 with shortness of breath and abdominal distention as well as left-sided chest pain.  Echo showed an ejection fraction of 15 to 20% with severe LVH.  His BMP was 562.  He presented to his PCP 05/29/2020 was found to have shortness of breath and bilateral lower extremity edema.  He had a 30 pound weight gain.  He was sent to the hospital with a BNP of 1020.  Echo showed an ejection fraction of less than 20%.  He was admitted 05/29/2020 to 06/06/2020 with marked volume overload.  Left heart catheterization showed no evidence of coronary artery disease.  Cardiac MRI showed a severely reduced EF with a noncoronary extensive scar pattern.  Repeat echo unfortunately showed his ejection fraction to continue to be low.  Today, he denies symptoms of palpitations, chest pain, shortness of breath, orthopnea, PND, lower extremity edema, claudication, dizziness, presyncope, syncope, bleeding, or neurologic sequela. The patient is tolerating medications without difficulties.    Past Medical History:  Diagnosis Date  . CHF (congestive heart failure) (HCC)    Past Surgical History:  Procedure Laterality Date  . RIGHT/LEFT HEART CATH AND CORONARY ANGIOGRAPHY N/A 06/01/2020   Procedure: RIGHT/LEFT HEART CATH AND CORONARY ANGIOGRAPHY;  Surgeon: Kyle Morale, MD;  Location: [facility] INVASIVE CV LAB;  Service: Cardiovascular;  Laterality: N/A;      Current Outpatient Medications  Medication Sig Dispense Refill  . bisacodyl (DULCOLAX) 5 MG EC tablet Take 10 mg by mouth as needed for mild constipation.     . digoxin (LANOXIN) 0.125 MG tablet TAKE 1 TABLET BY MOUTH DAILY 90 tablet 3  . empagliflozin (JARDIANCE) 10 MG TABS tablet Take 1 tablet (10 mg total) by mouth daily before breakfast. 90 tablet 3  . furosemide (LASIX) 40 MG tablet Take 1 tablet (40 mg total) by mouth 2 (two) times daily. 90 tablet 11  . ivabradine (CORLANOR) 5 MG TABS tablet Take 1 tablet (5 mg total) by mouth 2 (two) times daily with a meal. 180 tablet 3  . metoprolol succinate (TOPROL-XL) 100 MG 24 hr tablet Take 1 tablet (100 mg total) by mouth daily. 30 tablet 11  . Multiple Vitamin (MULTI-VITAMIN) tablet Take 1 tablet by mouth daily.    . sacubitril-valsartan (ENTRESTO) 97-103 MG Take 1 tablet by mouth 2 (two) times daily. 180 tablet 3  . spironolactone (ALDACTONE) 25 MG tablet Take 1 tablet (25 mg total) by mouth daily. 30 tablet 6  . thiamine 100 MG tablet TAKE 1 TABLET BY MOUTH EVERY DAY 30 tablet 11   No current facility-administered medications for this visit.    Allergies:   Raspberry and Penicillins   Social History:  The patient  reports that he quit smoking about 2 years ago. He has never used smokeless tobacco. He reports previous alcohol use. He reports that he does not use drugs.   Family History:  The patient's family history includes  Diabetes in his mother and sister; Stroke in his father.    ROS:  Please see the history of present illness.   Otherwise, review of systems is positive for none.   All other systems are reviewed and negative.    PHYSICAL EXAM: VS:  BP 100/60   Pulse 94   Ht 5\' 6"  (1.676 m)   Wt 263 lb (119.3 kg)   SpO2 95%   BMI 42.45 kg/m  , BMI Body mass index is 42.45 kg/m. GEN: Well nourished, well developed, in no acute distress  HEENT: normal  Neck: no JVD, carotid bruits, or masses Cardiac: RRR; no murmurs,  rubs, or gallops,no edema  Respiratory:  clear to auscultation bilaterally, normal work of breathing GI: soft, nontender, nondistended, + BS MS: no deformity or atrophy  Skin: warm and dry Neuro:  Strength and sensation are intact Psych: euthymic mood, full affect  EKG:  EKG is not ordered today. Personal review of the ekg ordered 11/22/20 shows sinus tachycardia, rate 120  Recent Labs: 05/30/2020: ALT 27 06/01/2020: TSH 2.095 06/03/2020: Magnesium 1.9 11/22/2020: B Natriuretic Peptide 1,246.0 11/29/2020: BUN 11; Creatinine, Ser 0.67; Hemoglobin 15.9; Platelets 271; Potassium 4.1; Sodium 133    Lipid Panel  No results found for: CHOL, TRIG, HDL, CHOLHDL, VLDL, LDLCALC, LDLDIRECT   Wt Readings from Last 3 Encounters:  11/30/20 263 lb (119.3 kg)  11/29/20 266 lb 12.8 oz (121 kg)  11/14/20 260 lb (117.9 kg)      Other studies Reviewed: Additional studies/ records that were reviewed today include: TTE 09/27/20  Review of the above records today demonstrates:  1. Left ventricular ejection fraction, by estimation, is <20%. The left  ventricle has severely decreased function. The left ventricle demonstrates  global hypokinesis. The left ventricular internal cavity size was  moderately dilated. Left ventricular  diastolic parameters are consistent with Grade II diastolic dysfunction  (pseudonormalization).  2. Right ventricular systolic function is moderately reduced. The right  ventricular size is moderately enlarged. There is moderately elevated  pulmonary artery systolic pressure. The estimated right ventricular  systolic pressure is 49.3 mmHg.  3. Left atrial size was moderately dilated.  4. Right atrial size was mildly dilated.  5. The mitral valve is normal in structure. Trivial mitral valve  regurgitation. No evidence of mitral stenosis.  6. The aortic valve is tricuspid. Aortic valve regurgitation is not  visualized. No aortic stenosis is present.  7. The inferior  vena cava is dilated in size with <50% respiratory  variability, suggesting right atrial pressure of 15 mmHg.   RHC/LHC 06/01/20 1. Markedly elevated right and left heart filling pressures.  2. Pulmonary venous hypertension.  3. Low cardiac output.  4. No significant coronary disease (minimal contrast given).   CMRI 06/05/20 1.  Severely dilated LV with diffuse hypokinesis, EF 17%.  2.  Moderately dilated RV with EF 20%.  3. Diffuse mid-wall LGE in the septum, inferior wall, and inferolateral wall.  Consider prior myocarditis with diffuse mid-wall LGE. T2 not elevated, but could be old myocarditis.  ASSESSMENT AND PLAN:  1.  Chronic systolic heart failure secondary to nonischemic cardiomyopathy: Prior alcohol abuse, but unlikely heavy enough to cause his cardiomyopathy.  Currently on auto medical therapy with Entresto, Lasix, Toprol-XL, Aldactone, ivabradine, Dapagliflozin.  His QRS is narrow.  He would thus benefit from ICD implant.  Risk and benefits of been discussed risk of bleeding, tamponade, infection, pneumothorax.  The patient understands these risks and is agreed to the procedure.  We Sherle Mello plan for S ICD implant.  2.  Alcohol abuse: Abstinence encouraged  3.  Obstructive sleep apnea: Awaiting CPAP.  Compliance encouraged.  Case discussed with primary cardiology  Current medicines are reviewed at length with the patient today.   The patient does not have concerns regarding his medicines.  The following changes were made today:  none  Labs/ tests ordered today include:  Orders Placed This Encounter  Procedures  . Basic metabolic panel  . CBC     Disposition:   FU with Satoru Milich 3 months  Signed, Shamar Kracke Jorja Loa, MD  11/30/2020 11:50 AM     [facility] [address] [place] [address] [phone] (office) [phone] (fax)

## 2020-12-05 ENCOUNTER — Telehealth: Payer: Self-pay | Admitting: *Deleted

## 2020-12-05 NOTE — Telephone Encounter (Signed)
The patient has been notified of the result and verbalized understanding.  All questions (if any) were answered. Latrelle Dodrill, CMA 12/05/2020 11:18 AM   Upon patient request DME selection is [facility] Patient understands he will be contacted by [facility] to set up his cpap. Patient understands to call if [facility] does not contact him with new setup in a timely manner. Patient understands they will be called once confirmation has been received from [facility] that they have received their new machine to schedule 10 week follow up appointment.   [facility] notified of new cpap order  Please add to airview Patient was grateful for the call and thanked me54

## 2020-12-05 NOTE — Telephone Encounter (Signed)
-----   Message from Quintella Reichert, MD sent at 11/29/2020  1:10 PM EDT ----- Please let patient know that they had a successful PAP titration and let DME know that orders are in EPIC.  Please set up 6 week OV with me.

## 2020-12-08 ENCOUNTER — Inpatient Hospital Stay (HOSPITAL_COMMUNITY)
Admission: RE | Disposition: A | Discharge status: Home / Self Care | Payer: Self-pay | Source: Home / Self Care | Attending: Cardiology

## 2020-12-08 ENCOUNTER — Observation Stay (HOSPITAL_COMMUNITY)
Admission: RE | Admit: 2020-12-08 | Discharge: 2020-12-10 | Disposition: A | Discharge status: Home / Self Care | Payer: 59 | Attending: Cardiology | Admitting: Cardiology

## 2020-12-08 ENCOUNTER — Encounter (HOSPITAL_COMMUNITY): Payer: Self-pay | Admitting: Cardiology

## 2020-12-08 ENCOUNTER — Other Ambulatory Visit: Payer: Self-pay

## 2020-12-08 DIAGNOSIS — I272 Pulmonary hypertension, unspecified: Secondary | ICD-10-CM | POA: Insufficient documentation

## 2020-12-08 DIAGNOSIS — I5043 Acute on chronic combined systolic (congestive) and diastolic (congestive) heart failure: Secondary | ICD-10-CM | POA: Diagnosis not present

## 2020-12-08 DIAGNOSIS — I5022 Chronic systolic (congestive) heart failure: Secondary | ICD-10-CM | POA: Diagnosis not present

## 2020-12-08 DIAGNOSIS — Z23 Encounter for immunization: Secondary | ICD-10-CM | POA: Diagnosis not present

## 2020-12-08 DIAGNOSIS — I509 Heart failure, unspecified: Secondary | ICD-10-CM

## 2020-12-08 DIAGNOSIS — Z79899 Other long term (current) drug therapy: Secondary | ICD-10-CM | POA: Diagnosis not present

## 2020-12-08 DIAGNOSIS — Z87891 Personal history of nicotine dependence: Secondary | ICD-10-CM | POA: Diagnosis not present

## 2020-12-08 HISTORY — PX: RIGHT HEART CATH: CATH118263

## 2020-12-08 LAB — BRAIN NATRIURETIC PEPTIDE: B Natriuretic Peptide: 774.1 pg/mL — ABNORMAL HIGH (ref 0.0–100.0)

## 2020-12-08 LAB — POCT I-STAT EG7
Acid-Base Excess: 0 mmol/L (ref 0.0–2.0)
Acid-Base Excess: 1 mmol/L (ref 0.0–2.0)
Bicarbonate: 27 mmol/L (ref 20.0–28.0)
Bicarbonate: 27.6 mmol/L (ref 20.0–28.0)
Calcium, Ion: 1.17 mmol/L (ref 1.15–1.40)
Calcium, Ion: 1.2 mmol/L (ref 1.15–1.40)
HCT: 45 % (ref 39.0–52.0)
HCT: 46 % (ref 39.0–52.0)
Hemoglobin: 15.3 g/dL (ref 13.0–17.0)
Hemoglobin: 15.6 g/dL (ref 13.0–17.0)
O2 Saturation: 65 %
O2 Saturation: 68 %
Potassium: 3.7 mmol/L (ref 3.5–5.1)
Potassium: 3.8 mmol/L (ref 3.5–5.1)
Sodium: 139 mmol/L (ref 135–145)
Sodium: 140 mmol/L (ref 135–145)
TCO2: 28 mmol/L (ref 22–32)
TCO2: 29 mmol/L (ref 22–32)
pCO2, Ven: 50.8 mmHg (ref 44.0–60.0)
pCO2, Ven: 51.1 mmHg (ref 44.0–60.0)
pH, Ven: 7.332 (ref 7.250–7.430)
pH, Ven: 7.341 (ref 7.250–7.430)
pO2, Ven: 36 mmHg (ref 32.0–45.0)
pO2, Ven: 38 mmHg (ref 32.0–45.0)

## 2020-12-08 LAB — CBC WITH DIFFERENTIAL/PLATELET
Abs Immature Granulocytes: 0.05 10*3/uL (ref 0.00–0.07)
Basophils Absolute: 0.1 10*3/uL (ref 0.0–0.1)
Basophils Relative: 1 %
Eosinophils Absolute: 0.2 10*3/uL (ref 0.0–0.5)
Eosinophils Relative: 2 %
HCT: 48.2 % (ref 39.0–52.0)
Hemoglobin: 16.2 g/dL (ref 13.0–17.0)
Immature Granulocytes: 1 %
Lymphocytes Relative: 20 %
Lymphs Abs: 2.1 10*3/uL (ref 0.7–4.0)
MCH: 30.3 pg (ref 26.0–34.0)
MCHC: 33.6 g/dL (ref 30.0–36.0)
MCV: 90.3 fL (ref 80.0–100.0)
Monocytes Absolute: 0.5 10*3/uL (ref 0.1–1.0)
Monocytes Relative: 5 %
Neutro Abs: 7.8 10*3/uL — ABNORMAL HIGH (ref 1.7–7.7)
Neutrophils Relative %: 71 %
Platelets: 225 10*3/uL (ref 150–400)
RBC: 5.34 MIL/uL (ref 4.22–5.81)
RDW: 14.8 % (ref 11.5–15.5)
WBC: 10.7 10*3/uL — ABNORMAL HIGH (ref 4.0–10.5)
nRBC: 0 % (ref 0.0–0.2)

## 2020-12-08 LAB — CREATININE, SERUM
Creatinine, Ser: 0.81 mg/dL (ref 0.61–1.24)
GFR, Estimated: >60 mL/min (ref >60)

## 2020-12-08 LAB — MAGNESIUM: Magnesium: 1.9 mg/dL (ref 1.7–2.4)

## 2020-12-08 LAB — BASIC METABOLIC PANEL
Anion gap: 8 (ref 5–15)
BUN: 14 mg/dL (ref 6–20)
CO2: 24 mmol/L (ref 22–32)
Calcium: 8.5 mg/dL — ABNORMAL LOW (ref 8.9–10.3)
Chloride: 102 mmol/L (ref 98–111)
Creatinine, Ser: 0.72 mg/dL (ref 0.61–1.24)
GFR, Estimated: >60 mL/min (ref >60)
Glucose, Bld: 112 mg/dL — ABNORMAL HIGH (ref 70–99)
Potassium: 3.9 mmol/L (ref 3.5–5.1)
Sodium: 134 mmol/L — ABNORMAL LOW (ref 135–145)

## 2020-12-08 LAB — GLUCOSE, CAPILLARY: Glucose-Capillary: 111 mg/dL — ABNORMAL HIGH (ref 70–99)

## 2020-12-08 LAB — TSH: TSH: 0.897 u[IU]/mL (ref 0.350–4.500)

## 2020-12-08 SURGERY — RIGHT HEART CATH
Anesthesia: LOCAL

## 2020-12-08 MED ORDER — POTASSIUM CHLORIDE CRYS ER 20 MEQ PO TBCR
40.0000 meq | EXTENDED_RELEASE_TABLET | Freq: Once | ORAL | Status: AC
  Administered 2020-12-08: 40 meq via ORAL
  Filled 2020-12-08: qty 2

## 2020-12-08 MED ORDER — FUROSEMIDE 10 MG/ML IJ SOLN
80.0000 mg | Freq: Three times a day (TID) | INTRAMUSCULAR | Status: AC
  Administered 2020-12-08 – 2020-12-09 (×4): 80 mg via INTRAVENOUS
  Filled 2020-12-08 (×4): qty 8

## 2020-12-08 MED ORDER — HEPARIN (PORCINE) IN NACL 1000-0.9 UT/500ML-% IV SOLN
INTRAVENOUS | Status: DC | PRN
  Administered 2020-12-08: 500 mL

## 2020-12-08 MED ORDER — SACUBITRIL-VALSARTAN 97-103 MG PO TABS
1.0000 | ORAL_TABLET | Freq: Two times a day (BID) | ORAL | Status: DC
  Administered 2020-12-08 – 2020-12-10 (×4): 1 via ORAL
  Filled 2020-12-08 (×5): qty 1

## 2020-12-08 MED ORDER — ASPIRIN 81 MG PO CHEW
81.0000 mg | CHEWABLE_TABLET | ORAL | Status: AC
Start: 2020-12-09 — End: 2020-12-08
  Administered 2020-12-08: 81 mg via ORAL
  Filled 2020-12-08: qty 1

## 2020-12-08 MED ORDER — LIDOCAINE HCL (PF) 1 % IJ SOLN
INTRAMUSCULAR | Status: DC | PRN
  Administered 2020-12-08: 1 mL

## 2020-12-08 MED ORDER — FUROSEMIDE 10 MG/ML IJ SOLN: 80.0000 mg | Freq: Two times a day (BID) | INTRAMUSCULAR | Status: DC

## 2020-12-08 MED ORDER — SODIUM CHLORIDE 0.9 % IV SOLN: 250.0000 mL | INTRAVENOUS | Status: DC | PRN

## 2020-12-08 MED ORDER — ONDANSETRON HCL 4 MG/2ML IJ SOLN: 4.0000 mg | Freq: Four times a day (QID) | INTRAMUSCULAR | Status: DC | PRN

## 2020-12-08 MED ORDER — ACETAMINOPHEN 325 MG PO TABS: 650.0000 mg | ORAL_TABLET | ORAL | Status: DC | PRN

## 2020-12-08 MED ORDER — ENOXAPARIN SODIUM 40 MG/0.4ML IJ SOSY
40.0000 mg | PREFILLED_SYRINGE | INTRAMUSCULAR | Status: DC
  Administered 2020-12-08 – 2020-12-09 (×2): 40 mg via SUBCUTANEOUS
  Filled 2020-12-08 (×2): qty 0.4

## 2020-12-08 MED ORDER — POTASSIUM CHLORIDE CRYS ER 20 MEQ PO TBCR: 40.0000 meq | EXTENDED_RELEASE_TABLET | Freq: Every day | ORAL | Status: DC

## 2020-12-08 MED ORDER — ALBUTEROL SULFATE (2.5 MG/3ML) 0.083% IN NEBU: 2.5000 mg | INHALATION_SOLUTION | Freq: Four times a day (QID) | RESPIRATORY_TRACT | Status: DC | PRN

## 2020-12-08 MED ORDER — MENTHOL 3 MG MT LOZG
1.0000 | LOZENGE | OROMUCOSAL | Status: DC | PRN
  Administered 2020-12-08 – 2020-12-09 (×5): 3 mg via ORAL
  Filled 2020-12-08 (×2): qty 9

## 2020-12-08 MED ORDER — PNEUMOCOCCAL VAC POLYVALENT 25 MCG/0.5ML IJ INJ
0.5000 mL | INJECTION | INTRAMUSCULAR | Status: AC
  Administered 2020-12-09: 0.5 mL via INTRAMUSCULAR
  Filled 2020-12-08: qty 0.5

## 2020-12-08 MED ORDER — EMPAGLIFLOZIN 10 MG PO TABS
10.0000 mg | ORAL_TABLET | Freq: Every day | ORAL | Status: DC
  Administered 2020-12-09 – 2020-12-10 (×2): 10 mg via ORAL
  Filled 2020-12-08 (×2): qty 1

## 2020-12-08 MED ORDER — METOPROLOL SUCCINATE ER 100 MG PO TB24
100.0000 mg | ORAL_TABLET | Freq: Every day | ORAL | Status: DC
  Administered 2020-12-08 – 2020-12-10 (×3): 100 mg via ORAL
  Filled 2020-12-08 (×3): qty 1

## 2020-12-08 MED ORDER — HEPARIN (PORCINE) IN NACL 1000-0.9 UT/500ML-% IV SOLN
INTRAVENOUS | Status: AC
  Filled 2020-12-08: qty 500

## 2020-12-08 MED ORDER — SODIUM CHLORIDE 0.9% FLUSH
3.0000 mL | Freq: Two times a day (BID) | INTRAVENOUS | Status: DC
  Administered 2020-12-08 – 2020-12-09 (×2): 3 mL via INTRAVENOUS

## 2020-12-08 MED ORDER — IVABRADINE HCL 5 MG PO TABS
5.0000 mg | ORAL_TABLET | Freq: Two times a day (BID) | ORAL | Status: DC
  Administered 2020-12-08 – 2020-12-10 (×4): 5 mg via ORAL
  Filled 2020-12-08 (×5): qty 1

## 2020-12-08 MED ORDER — VERAPAMIL HCL 2.5 MG/ML IV SOLN
INTRAVENOUS | Status: AC
  Filled 2020-12-08: qty 2

## 2020-12-08 MED ORDER — THIAMINE HCL 100 MG PO TABS
100.0000 mg | ORAL_TABLET | Freq: Every day | ORAL | Status: DC
  Administered 2020-12-09 – 2020-12-10 (×2): 100 mg via ORAL
  Filled 2020-12-08 (×3): qty 1

## 2020-12-08 MED ORDER — SODIUM CHLORIDE 0.9% FLUSH: 3.0000 mL | INTRAVENOUS | Status: DC | PRN

## 2020-12-08 MED ORDER — SODIUM CHLORIDE 0.9% FLUSH
3.0000 mL | Freq: Two times a day (BID) | INTRAVENOUS | Status: DC
  Administered 2020-12-09 – 2020-12-10 (×3): 3 mL via INTRAVENOUS

## 2020-12-08 MED ORDER — DIGOXIN 125 MCG PO TABS
125.0000 ug | ORAL_TABLET | Freq: Every day | ORAL | Status: DC
  Administered 2020-12-08 – 2020-12-10 (×3): 125 ug via ORAL
  Filled 2020-12-08 (×3): qty 1

## 2020-12-08 MED ORDER — LIDOCAINE HCL (PF) 1 % IJ SOLN
INTRAMUSCULAR | Status: AC
  Filled 2020-12-08: qty 30

## 2020-12-08 MED ORDER — SODIUM CHLORIDE 0.9 % IV SOLN: INTRAVENOUS | Status: DC

## 2020-12-08 MED ORDER — FUROSEMIDE 10 MG/ML IJ SOLN
INTRAMUSCULAR | Status: AC
  Filled 2020-12-08: qty 8

## 2020-12-08 MED ORDER — BISACODYL 5 MG PO TBEC: 10.0000 mg | DELAYED_RELEASE_TABLET | Freq: Every day | ORAL | Status: DC | PRN

## 2020-12-08 MED ORDER — ADULT MULTIVITAMIN W/MINERALS CH
1.0000 | ORAL_TABLET | Freq: Every day | ORAL | Status: DC
  Administered 2020-12-09 – 2020-12-10 (×2): 1 via ORAL
  Filled 2020-12-08 (×2): qty 1

## 2020-12-08 MED ORDER — SPIRONOLACTONE 25 MG PO TABS
25.0000 mg | ORAL_TABLET | Freq: Every day | ORAL | Status: DC
  Administered 2020-12-08 – 2020-12-10 (×3): 25 mg via ORAL
  Filled 2020-12-08 (×3): qty 1

## 2020-12-08 SURGICAL SUPPLY — 8 items
CATH SWAN GANZ 7F STRAIGHT (CATHETERS) ×2 IMPLANT
GLIDESHEATH SLENDER 7FR .021G (SHEATH) ×2 IMPLANT
KIT HEART LEFT (KITS) ×2 IMPLANT
PACK CARDIAC CATHETERIZATION (CUSTOM PROCEDURE TRAY) ×2 IMPLANT
SHEATH GLIDE SLENDER 4/5FR (SHEATH) ×2 IMPLANT
TRANSDUCER W/STOPCOCK (MISCELLANEOUS) ×2 IMPLANT
TUBING ART PRESS 72  MALE/FEM (TUBING) ×1
TUBING ART PRESS 72 MALE/FEM (TUBING) ×1 IMPLANT

## 2020-12-08 NOTE — Interval H&P Note (Signed)
History and Physical Interval Note:  12/08/2020 12:11 PM  Kyle Pearson  has presented today for surgery, with the diagnosis of CHF.  The various methods of treatment have been discussed with the patient and family. After consideration of risks, benefits and other options for treatment, the patient has consented to  Procedure(s): RIGHT HEART CATH (N/A) as a surgical intervention.  The patient's history has been reviewed, patient examined, no change in status, stable for surgery.  I have reviewed the patient's chart and labs.  Questions were answered to the patient's satisfaction.     Talib Headley [employer]

## 2020-12-08 NOTE — Progress Notes (Signed)
Assumed patient care at 1515hrs, report received from [facility].  Reviewed plan of care and post cath instructions with patient, he verbalized understanding.   Right brachial site level zero, +2 radial pulse.

## 2020-12-08 NOTE — H&P (Signed)
Advanced Heart Failure Team History and Physical Note   PCP:  Patient, No Pcp Per (Inactive)  PCP-Cardiology: None     Reason for Admission: CHF   HPI:    Kyle Pearson is a 45 y.o. with history of tobacco and alcohol use, diagnosed with CHF in 10/21.     Prior to 11/21, [name] reports no past medical history.  He used to smoke less than 1/2 pack per day and drank about 6 pack of beer on the weekend.  He denies family history of heart disease but reported his father used to drink alcohol heavily but quit as well.  He states in 03/16/2020 he started to develop a cough and didn't want to mix medication with alcohol so he stopped drinking and started to take cough medication.  He noticed one day a very severe pain on his left side and pain with swallowing.  He was diagnosed with peritonsillar abscess and placed on clindamycin (300 mg po QID x 10 days) and decadron.  He reports feeling better throat wise but noticed increase swelling to his abdomen and difficulty breathing.  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  He continued to experience trouble breathing and walking short distances were difficulty.     He went to [facility] on 04/24/2020 for shortness of breath, abdominal distention and left sided chest pain. 2D echo showed EF 15-20% with severe LVH, BNP 562 and troponin 54.  He was started on lisinopril 5 mg, toprol-XL 25 mg PO daily and lasix IV. Blood pressure was low and required albumin 12.5 mg IV x 3 doses to help with BP.  He also had an abdominal ultrasound which showed no significant asites but note mild liver cirrhosis with mild gallbladder wall thickening. He was discharged home on 40 mg PO lasix, KCL 20 mEq, lisinopril 5 and toprol-XL 25.     He presented to 1st PCP appt on 05/29/20 and was found to have shortness of breath, bilaterally lower extremity edema and abdominal distention.  Reporting 30lb weight  gain despite compliance with medication.  He was sent to [facility] , where his BNP was [facility] BNP 562), CXR showed cardiomegaly only.  Repeat echo revealed EF < 20%, severe decrease function with global hypokinesis, grade III DD, dilated LA/RA and small pericardial effusion. He was admitted 05/29/20-06/06/20 with marked volume overload. Diuresed with IV lasix and set for cath. Cath showed elevated filling pressures and low ouput. Milrinone was added and he continued to diurese on IV. Milrinone later weaned off with stable mixed venous saturation. Started on GDMT with 4 corner stone HF meds. Had cMRI with severely reduced EF and non-coronary pattern extensive scar. Due to concern for ectopy from scar, he was discharged with Lifevest.  Diuresed 35 lbs. Discharge weight 255.6 lbs.   Echo 3/22 showed that EF remains < 20%, moderate LVE, moderate RVE, mildly decreased RV systolic function, IVC dilated.  CPX in 4/22 showed only a mild HF limitation.  He has been seen by EP, planned for implantation of [place] ICD.    Patient has continued to be dyspneic with moderate exertion.  Main complaint has been worsening cough.  He also has orthopnea.  No chest pain, no lightheadedness.  RHC was done as below.  I decided to admit him based on markedly elevated filling pressures.   RHC Procedural Findings: Hemodynamics (mmHg) RA mean 14 RV 64/18  PA 61/32, mean 42 PCWP mean 30 Oxygen saturations: PA 67% AO 99% Cardiac Output (Fick) 6.7  Cardiac Index (Fick) 3 PVR 1.8 WU Cardiac Output (Thermo) 6.36  Cardiac Index (Thermo) 2.83  PVR 1.9 WU   PMH: 1. Prior smoker. 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  HIV negative.  No strong family history. Prior heavy ETOH but quit in 9/21.   - Echo (11/21): EF < 20%, severe LV dilation, severe RVE and severely decreased RV systolic function, severe biatrial enlargement. - Cardiac MRI (11/21): Severely dilated LV with diffuse  hypokinesis, EF 17%., mod dilated RV with EF 20%, diffuse mid-wall LGE in septum, inferior wall & inferolateral wall - LHC/RHC (12/21): No significant CAD; mean RA 29, PA 69/32 mean 49, mean PCWP 43, CI 2.1 (Fick) and 1.7 (thermo), PVR 1.22 - Echo (3/22): EF < 20%, moderate LVE, moderate RVE, mildly decreased RV systolic function, IVC dilated.  - CPX (4/22): Peak VO2 20.2, VE/VCO2 slope 33, RER 1.07.  Mild HF limitation. 4. OSA  Review of Systems: All systems reviewed and negative except as per HPI.   Home Medications Prior to Admission medications   Medication Sig Start Date End Date Taking? Authorizing Provider  albuterol (VENTOLIN HFA) 108 (90 Base) MCG/ACT inhaler Inhale 2 puffs into the lungs every 6 (six) hours as needed (Cough).   Yes [provider]  bisacodyl (DULCOLAX) 5 MG EC tablet Take 10 mg by mouth daily as needed for mild constipation.   Yes [provider]  digoxin (LANOXIN) 0.125 MG tablet TAKE 1 TABLET BY MOUTH DAILY Patient taking differently: Take 0.125 mg by mouth daily. 09/04/20  Yes Milford, Anderson Malta, FNP  empagliflozin (JARDIANCE) 10 MG TABS tablet Take 1 tablet (10 mg total) by mouth daily before breakfast. 07/31/20  Yes Laurey Morale, MD  furosemide (LASIX) 40 MG tablet Take 1 tablet (40 mg total) by mouth 2 (two) times daily. 11/29/20  Yes Laurey Morale, MD  ivabradine (CORLANOR) 5 MG TABS tablet Take 1 tablet (5 mg total) by mouth 2 (two) times daily with a meal. 08/08/20  Yes Milford, Anderson Malta, FNP  metoprolol succinate (TOPROL-XL) 100 MG 24 hr tablet Take 1 tablet (100 mg total) by mouth daily. Patient taking differently: Take 25 mg by mouth daily. 11/29/20  Yes Laurey Morale, MD  Multiple Vitamin (MULTI-VITAMIN) tablet Take 1 tablet by mouth daily.   Yes [provider]  potassium chloride SA (KLOR-CON) 20 MEQ tablet Take 40 mEq by mouth daily.   Yes [provider]  sacubitril-valsartan (ENTRESTO) 97-103 MG Take 1 tablet by  mouth 2 (two) times daily. 07/31/20  Yes Laurey Morale, MD  spironolactone (ALDACTONE) 25 MG tablet Take 1 tablet (25 mg total) by mouth daily. 08/08/20  Yes Milford, Anderson Malta, FNP  thiamine 100 MG tablet TAKE 1 TABLET BY MOUTH EVERY DAY Patient taking differently: Take 100 mg by mouth daily. 10/11/20  Yes Jacklynn Ganong, FNP   Past Surgical History: Past Surgical History:  Procedure Laterality Date   RIGHT/LEFT HEART CATH AND CORONARY ANGIOGRAPHY N/A 06/01/2020   Procedure: RIGHT/LEFT HEART CATH AND CORONARY ANGIOGRAPHY;  Surgeon: Laurey Morale, MD;  Location: [facility] INVASIVE CV LAB;  Service: Cardiovascular;  Laterality: N/A;    Family History:  Family History  Problem Relation Age of Onset   Diabetes Mother    Stroke Father    Diabetes Sister     Social History: Social History   Socioeconomic History  Marital status: Single    Spouse name: Not on file   Number of children: Not on file   Years of education: Not on file   Highest education level: Not on file  Occupational History   Not on file  Tobacco Use   Smoking status: Former    Pack years: 0.00    Types: Cigarettes    Quit date: 2020    Years since quitting: 2.4   Smokeless tobacco: Never  Vaping Use   Vaping Use: Never used  Substance and Sexual Activity   Alcohol use: Not Currently   Drug use: Never   Sexual activity: Not on file  Other Topics Concern   Not on file  Social History Narrative   Not on file   Social Determinants of Health   Financial Resource Strain: Not on file  Food Insecurity: Not on file  Transportation Needs: Not on file  Physical Activity: Not on file  Stress: Not on file  Social Connections: Not on file    Allergies:  Allergies  Allergen Reactions   Raspberry Anaphylaxis   Penicillins Rash    Reaction: Childhood    Objective:    Vital Signs:   Temp:  [98.6 F (37 C)] 98.6 F (37 C) (06/10 1006) Pulse Rate:  [78-87] 84 (06/10 1300) Resp:  [15-22] 22 (06/10  1300) BP: (120-143)/(82-106) 133/94 (06/10 1300) SpO2:  [97 %-100 %] 97 % (06/10 1300) Weight:  [119.3 kg] 119.3 kg (06/10 1006)   Filed Weights   12/08/20 1006  Weight: 119.3 kg     Physical Exam     General:  Well appearing. No respiratory difficulty HEENT: Normal Neck: Thick.  JVP 14 cm. Carotids 2+ bilat; no bruits. No lymphadenopathy or thyromegaly appreciated. Cor: PMI nondisplaced. Regular rate & rhythm. No rubs, gallops or murmurs. Lungs: Clear Abdomen: Soft, nontender, nondistended. No hepatosplenomegaly. No bruits or masses. Good bowel sounds. Extremities: No cyanosis, clubbing, rash. 1+ ankle edema.  Neuro: Alert & oriented x 3, cranial nerves grossly intact. moves all 4 extremities w/o difficulty. Affect pleasant.   Telemetry   NSR (personally reviewed)  Labs     Basic Metabolic Panel: Recent Labs  Lab 12/08/20 1046 12/08/20 1229  NA 134* 139  140  K 3.9 3.8  3.7  CL 102  --   CO2 24  --   GLUCOSE 112*  --   BUN 14  --   CREATININE 0.72  --   CALCIUM 8.5*  --     Liver Function Tests: No results for input(s): AST, ALT, ALKPHOS, BILITOT, PROT, ALBUMIN in the last 168 hours. No results for input(s): LIPASE, AMYLASE in the last 168 hours. No results for input(s): AMMONIA in the last 168 hours.  CBC: Recent Labs  Lab 12/08/20 1229  HGB 15.6  15.3  HCT 46.0  45.0    Cardiac Enzymes: No results for input(s): CKTOTAL, CKMB, CKMBINDEX, TROPONINI in the last 168 hours.  BNP: BNP (last 3 results) Recent Labs    05/29/20 1255 11/22/20 0950  BNP 1,020.9* 1,246.0*    ProBNP (last 3 results) No results for input(s): PROBNP in the last 8760 hours.   CBG: No results for input(s): GLUCAP in the last 168 hours.  Coagulation Studies: No results for input(s): LABPROT, INR in the last 72 hours.  Imaging: CARDIAC CATHETERIZATION  Result Date: 12/08/2020 1. Markedly elevated PCWP. 2. Moderate pulmonary venous hypertension. 3. Preserved  cardiac output. I will admit him for diuresis.  Assessment/Plan   1. Acute on chronic systolic CHF: Nonischemic cardiomyopathy.  Symptomatic since 9/21 (after episode of peri-tonsillar abscess).  He was a prior drinker (none since 9/21) but does not seem to describe heavy enough ETOH to cause profound cardiomyopathy (though he did have mild cirrhosis on abdominal US at [facility]).  No FH of cardiomyopathy.  Born in [place], has been in [place] x many years.  HIV negative 9/21.  Echo 11/21 with EF < 20% with severe LV dilation, moderate RVE with severely decreased systolic function, severe biatrial enlargement, mild MR. RHC/LHC 12/21 showed no coronary disease, markedly elevated filling pressures, and low cardiac output. Cardiac MRI in 11/21 showed severely dilated LV with diffuse hypokinesis, EF 17%, mod dilated RV with EF 20%, diffuse mid-wall LGE in septum, inferior wall & inferolateral wall.  LGE pattern suggests possible prior myocarditis. Echo in 3/22 showed that EF remains < 20% with mild RV dysfunction.  CPX in 4/22 showed only a mild HF limitation.  Seen in the ER 11/22/20 (says he missed only 1 day of medications), got IV diuresis.  RHC today showed preserved cardiac output but severely elevated filling pressures.  He remains volume overloaded despite increase in Lasix as outpatient, NYHA class III symptoms with prominent cough.  - Lasix 80 mg IV every 8 hrs, replace K.  - Continue Entresto to 97/103 mg bid.   - Continue digoxin 0.125 daily, check level. - Continue Toprol XL 100 mg daily. - Continue spironolactone 25 mg daily. - Continue Dapagliflozin 10 mg daily.   - Continue ivabradine 5 mg bid.  - With h/o neurocysticercosis, have sent Trypanosoma cruzi antibody panel => cannot totally rule out CMP related to trypanosomiasis.  - EP has seen, plan subcutaneous ICD placement.  He is not a CRT candidate.  Once ICD is placed, can stop Lifevest. 2. ETOH:  He did have early cirrhosis on abdominal US at  [facility].  However, with obesity could be NASH. No further ETOH (abstinence does not appear to have helped LV EF, doubt ETOH is the major cause for low EF). 3. OSA: Will give him CPAP qhs.   Marca Ancona, MD 12/08/2020, 1:19 PM  Advanced Heart Failure Team Pager [phone] (M-F; 7a - 5p)  Please contact [facility] for night-coverage after hours (4p -7a ) and weekends on [email or web address]

## 2020-12-09 ENCOUNTER — Inpatient Hospital Stay (HOSPITAL_COMMUNITY): Payer: 59

## 2020-12-09 DIAGNOSIS — I5043 Acute on chronic combined systolic (congestive) and diastolic (congestive) heart failure: Secondary | ICD-10-CM | POA: Diagnosis not present

## 2020-12-09 DIAGNOSIS — I5022 Chronic systolic (congestive) heart failure: Secondary | ICD-10-CM | POA: Diagnosis not present

## 2020-12-09 LAB — BASIC METABOLIC PANEL
Anion gap: 10 (ref 5–15)
BUN: 12 mg/dL (ref 6–20)
CO2: 27 mmol/L (ref 22–32)
Calcium: 9 mg/dL (ref 8.9–10.3)
Chloride: 98 mmol/L (ref 98–111)
Creatinine, Ser: 0.81 mg/dL (ref 0.61–1.24)
GFR, Estimated: >60 mL/min (ref >60)
Glucose, Bld: 119 mg/dL — ABNORMAL HIGH (ref 70–99)
Potassium: 3.4 mmol/L — ABNORMAL LOW (ref 3.5–5.1)
Sodium: 135 mmol/L (ref 135–145)

## 2020-12-09 LAB — DIGOXIN LEVEL: Digoxin Level: 0.3 ng/mL — ABNORMAL LOW (ref 0.8–2.0)

## 2020-12-09 MED ORDER — TORSEMIDE 20 MG PO TABS
40.0000 mg | ORAL_TABLET | Freq: Every day | ORAL | Status: DC
  Administered 2020-12-10: 40 mg via ORAL
  Filled 2020-12-09: qty 2

## 2020-12-09 MED ORDER — POTASSIUM CHLORIDE CRYS ER 20 MEQ PO TBCR
40.0000 meq | EXTENDED_RELEASE_TABLET | Freq: Every day | ORAL | Status: DC
  Administered 2020-12-09 – 2020-12-10 (×2): 40 meq via ORAL
  Filled 2020-12-09 (×2): qty 2

## 2020-12-09 MED ORDER — MAGNESIUM SULFATE 2 GM/50ML IV SOLN
2.0000 g | Freq: Once | INTRAVENOUS | Status: AC
  Administered 2020-12-09: 2 g via INTRAVENOUS
  Filled 2020-12-09: qty 50

## 2020-12-09 NOTE — Progress Notes (Signed)
Patient ID: Kyle Pearson, male   DOB: [birth year], 45 y.o.   MRN: [id number]     Advanced Heart Failure Rounding Note  PCP-Cardiologist: None   Subjective:    Excellent diuresis yesterday, weight down 11 lbs.  Breathing better, still has a cough.    Objective:   Weight Range: 114.4 kg Body mass index is 40.72 kg/m.   Vital Signs:   Temp:  [97.9 F (36.6 C)-98.6 F (37 C)] 98.2 F (36.8 C) (06/10 2330) Pulse Rate:  [71-91] 71 (06/11 0545) Resp:  [12-22] 18 (06/11 0545) BP: (107-152)/(76-106) 109/76 (06/10 2330) SpO2:  [96 %-100 %] 98 % (06/10 2330) Weight:  [114.4 kg-119.3 kg] 114.4 kg (06/11 0545) Last BM Date: 12/07/20  Weight change: Filed Weights   12/08/20 1006 12/09/20 0545  Weight: 119.3 kg 114.4 kg    Intake/Output:   Intake/Output Summary (Last 24 hours) at 12/09/2020 0839 Last data filed at 12/09/2020 0500 Gross per 24 hour  Intake 1111.85 ml  Output 5900 ml  Net -4788.15 ml      Physical Exam    General:  Well appearing. No resp difficulty HEENT: Normal Neck: Supple. JVP 8-9 cm. Carotids 2+ bilat; no bruits. No lymphadenopathy or thyromegaly appreciated. Cor: PMI nondisplaced. Regular rate & rhythm. No rubs, gallops or murmurs. Lungs: Clear Abdomen: Soft, nontender, nondistended. No hepatosplenomegaly. No bruits or masses. Good bowel sounds. Extremities: No cyanosis, clubbing, rash. Trace ankle edema.  Neuro: Alert & orientedx3, cranial nerves grossly intact. moves all 4 extremities w/o difficulty. Affect pleasant   Telemetry   NSR (personally reviewed)   Labs    CBC Recent Labs    12/08/20 1229 12/08/20 1452  WBC  --  10.7*  NEUTROABS  --  7.8*  HGB 15.6  15.3 16.2  HCT 46.0  45.0 48.2  MCV  --  90.3  PLT  --  225   Basic Metabolic Panel Recent Labs    74/16/38 1046 12/08/20 1229 12/08/20 1452 12/09/20 0230  NA 134* 139  140  --  135  K 3.9 3.8  3.7  --  3.4*  CL 102  --   --  98  CO2 24  --   --  27  GLUCOSE  112*  --   --  119*  BUN 14  --   --  12  CREATININE 0.72  --  0.81 0.81  CALCIUM 8.5*  --   --  9.0  MG  --   --  1.9  --    Liver Function Tests No results for input(s): AST, ALT, ALKPHOS, BILITOT, PROT, ALBUMIN in the last 72 hours. No results for input(s): LIPASE, AMYLASE in the last 72 hours. Cardiac Enzymes No results for input(s): CKTOTAL, CKMB, CKMBINDEX, TROPONINI in the last 72 hours.  BNP: BNP (last 3 results) Recent Labs    05/29/20 1255 11/22/20 0950 12/08/20 1452  BNP 1,020.9* 1,246.0* 774.1*    ProBNP (last 3 results) No results for input(s): PROBNP in the last 8760 hours.   D-Dimer No results for input(s): DDIMER in the last 72 hours. Hemoglobin A1C No results for input(s): HGBA1C in the last 72 hours. Fasting Lipid Panel No results for input(s): CHOL, HDL, LDLCALC, TRIG, CHOLHDL, LDLDIRECT in the last 72 hours. Thyroid Function Tests Recent Labs    12/08/20 1452  TSH 0.897    Other results:   Imaging    DG Chest 2 View  Result Date: 12/09/2020 CLINICAL DATA:  Congestive heart failure EXAM:  CHEST - 2 VIEW COMPARISON:  11/22/2020 FINDINGS: Motion and arm position degraded lateral view. Midline trachea. Moderate cardiomegaly. No pleural effusion or pneumothorax. Significant improvement in pulmonary interstitial thickening. No lobar consolidation. IMPRESSION: Cardiomegaly with significantly improved pulmonary interstitial thickening. Residual mild interstitial thickening could represent venous congestion or the sequelae of smoking/chronic bronchitis (ex-smoker). Electronically Signed   By: Jeronimo Greaves M.D.   On: 12/09/2020 07:49   CARDIAC CATHETERIZATION  Result Date: 12/08/2020 1. Markedly elevated PCWP. 2. Moderate pulmonary venous hypertension. 3. Preserved cardiac output. I will admit him for diuresis.     Medications:     Scheduled Medications:  digoxin  125 mcg Oral Daily   empagliflozin  10 mg Oral QAC breakfast   enoxaparin (LOVENOX)  injection  40 mg Subcutaneous Q24H   furosemide  80 mg Intravenous Q8H   ivabradine  5 mg Oral BID WC   metoprolol succinate  100 mg Oral Daily   multivitamin with minerals  1 tablet Oral Daily   pneumococcal 23 valent vaccine  0.5 mL Intramuscular Tomorrow-1000   potassium chloride SA  40 mEq Oral Daily   sacubitril-valsartan  1 tablet Oral BID   sodium chloride flush  3 mL Intravenous Q12H   sodium chloride flush  3 mL Intravenous Q12H   spironolactone  25 mg Oral Daily   thiamine  100 mg Oral Daily    Infusions:  sodium chloride     sodium chloride Stopped (12/08/20 1845)   sodium chloride     magnesium sulfate bolus IVPB      PRN Medications: sodium chloride, sodium chloride, acetaminophen, albuterol, bisacodyl, menthol-cetylpyridinium, ondansetron (ZOFRAN) IV, sodium chloride flush, sodium chloride flush   Assessment/Plan   1. Acute on chronic systolic CHF: Nonischemic cardiomyopathy.  Symptomatic since 9/21 (after episode of peri-tonsillar abscess).  He was a prior drinker (none since 9/21) but does not seem to describe heavy enough ETOH to cause profound cardiomyopathy (though he did have mild cirrhosis on abdominal US at [facility]).  No FH of cardiomyopathy.  Born in [place], has been in [place] x many years.  HIV negative 9/21.  Echo 11/21 with EF < 20% with severe LV dilation, moderate RVE with severely decreased systolic function, severe biatrial enlargement, mild MR. RHC/LHC 12/21 showed no coronary disease, markedly elevated filling pressures, and low cardiac output. Cardiac MRI in 11/21 showed severely dilated LV with diffuse hypokinesis, EF 17%, mod dilated RV with EF 20%, diffuse mid-wall LGE in septum, inferior wall & inferolateral wall.  LGE pattern suggests possible prior myocarditis. Echo in 3/22 showed that EF remains < 20% with mild RV dysfunction.  CPX in 4/22 showed only a mild HF limitation.  Seen in the ER 11/22/20 (says he missed only 1 day of medications), got IV diuresis.   RHC 6/10 showed preserved cardiac output but severely elevated filling pressures.  He has diuresed well so far on Lasix IV, still with cough and some dyspnea. Suspect he needs 1 more day IV diuresis.  - Lasix 80 mg IV for 2 more doses today.  Will transition to torsemide 40 mg daily tomorrow for home. Replace K and Mg.  - Continue Entresto to 97/103 mg bid.   - Continue digoxin 0.125 daily, check level. - Continue Toprol XL 100 mg daily. - Continue spironolactone 25 mg daily. - Continue Dapagliflozin 10 mg daily.   - Continue ivabradine 5 mg bid.  - With h/o neurocysticercosis, have sent Trypanosoma cruzi antibody panel => cannot totally rule out CMP  related to trypanosomiasis.  - EP has seen, plan subcutaneous ICD placement.  He is not a CRT candidate.  Once ICD is placed, can stop Lifevest. 2. ETOH:  He did have early cirrhosis on abdominal US at [facility].  However, with obesity could be NASH. No further ETOH (abstinence does not appear to have helped LV EF, doubt ETOH is the major cause for low EF). 3. OSA: Will give him CPAP qhs.   Length of Stay: 1  Marca Ancona, MD  12/09/2020, 8:39 AM  Advanced Heart Failure Team Pager [phone] (M-F; 7a - 5p)  Please contact [facility] for night-coverage after hours (5p -7a ) and weekends on [email or web address]

## 2020-12-10 ENCOUNTER — Other Ambulatory Visit (HOSPITAL_COMMUNITY): Payer: Self-pay | Admitting: Family Medicine

## 2020-12-10 DIAGNOSIS — I5022 Chronic systolic (congestive) heart failure: Secondary | ICD-10-CM | POA: Diagnosis not present

## 2020-12-10 DIAGNOSIS — I5023 Acute on chronic systolic (congestive) heart failure: Secondary | ICD-10-CM | POA: Diagnosis not present

## 2020-12-10 LAB — BASIC METABOLIC PANEL
Anion gap: 9 (ref 5–15)
BUN: 19 mg/dL (ref 6–20)
CO2: 25 mmol/L (ref 22–32)
Calcium: 8.8 mg/dL — ABNORMAL LOW (ref 8.9–10.3)
Chloride: 99 mmol/L (ref 98–111)
Creatinine, Ser: 0.92 mg/dL (ref 0.61–1.24)
GFR, Estimated: >60 mL/min (ref >60)
Glucose, Bld: 107 mg/dL — ABNORMAL HIGH (ref 70–99)
Potassium: 4 mmol/L (ref 3.5–5.1)
Sodium: 133 mmol/L — ABNORMAL LOW (ref 135–145)

## 2020-12-10 MED ORDER — TORSEMIDE 40 MG PO TABS: 40.0000 mg | ORAL_TABLET | Freq: Every day | ORAL | 1 refills | Status: DC

## 2020-12-10 MED ORDER — POTASSIUM CHLORIDE CRYS ER 20 MEQ PO TBCR: 40.0000 meq | EXTENDED_RELEASE_TABLET | Freq: Every day | ORAL | 0 refills | Status: DC

## 2020-12-10 NOTE — Discharge Summary (Signed)
Discharge Summary    Patient ID: Kyle Pearson MRN: [id number]; DOB: 21-Apr-1976  Admit date: 12/08/2020 Discharge date: 12/10/2020  PCP:  Patient, No Pcp Per (Inactive)   [facility] Providers Cardiologist: Dr. Shirlee Latch   Discharge Diagnoses    Active Problems:   CHF (congestive heart failure) [facility])    Diagnostic Studies/Procedures    RHC 12/08/20: noted below _____________   History of Present Illness     Kyle Pearson is a 45 y.o. male with history of tobacco and alcohol use, diagnosed with CHF in 10/21.     Prior to 11/21, [name] reported no past medical history.  He used to smoke less than 1/2 pack per day and drank about 6 pack of beer on the weekend.  He denied family history of heart disease but reported his father used to drink alcohol heavily but quit as well.  He stated in 03/16/2020 he started to develop a cough and didn't want to mix medication with alcohol so he stopped drinking and started to take cough medication.  He noticed one day a very severe pain on his left side and pain with swallowing.  He was diagnosed with peritonsillar abscess and placed on clindamycin (300 mg po QID x 10 days) and decadron.  He reports feeling better throat wise but noticed increase swelling to his abdomen and difficulty breathing.  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  He continued to experience trouble breathing and walking short distances were difficulty.     He went to [facility] on 04/24/2020 for shortness of breath, abdominal distention and left sided chest pain. 2D echo showed EF 15-20% with severe LVH, BNP 562 and troponin 54.  He was started on lisinopril 5 mg, toprol-XL 25 mg PO daily and lasix IV. Blood pressure was low and required albumin 12.5 mg IV x 3 doses to help with BP.  He also had an abdominal ultrasound which showed no significant asites but note mild liver cirrhosis with mild  gallbladder wall thickening. He was discharged home on 40 mg PO lasix, KCL 20 mEq, lisinopril 5 and toprol-XL 25.     He presented to 1st PCP appt on 05/29/20 and was found to have shortness of breath, bilaterally lower extremity edema and abdominal distention.  Reporting 30lb weight gain despite compliance with medication.  He was sent to [facility] , where his BNP was [facility] BNP 562), CXR showed cardiomegaly only.  Repeat echo revealed EF < 20%, severe decrease function with global hypokinesis, grade III DD, dilated LA/RA and small pericardial effusion. He was admitted 05/29/20-06/06/20 with marked volume overload. Diuresed with IV lasix and set for cath. Cath showed elevated filling pressures and low ouput. Milrinone was added and he continued to diurese on IV. Milrinone later weaned off with stable mixed venous saturation. Started on GDMT with 4 corner stone HF meds. Had cMRI with severely reduced EF and non-coronary pattern extensive scar. Due to concern for ectopy from scar, he was discharged with Lifevest.  Diuresed 35 lbs. Discharge weight 255.6 lbs.   Echo 3/22 showed that EF remained < 20%, moderate LVE, moderate RVE, mildly decreased RV systolic function, IVC dilated.  CPX in 4/22 showed only a mild HF limitation.  He had been seen by EP, planned for implantation of [place] ICD.   Patient had continued to be dyspneic with moderate exertion.  Main complaint had been worsening cough.  He also had orthopnea.  No chest pain, no lightheadedness.  He was set up for outpatient RHC noted below, and admitted based on markedly elevated filling pressures.    RHC Procedural Findings: Hemodynamics (mmHg) RA mean 14 RV 64/18 PA 61/32, mean 42 PCWP mean 30 Oxygen saturations: PA 67% AO 99% Cardiac Output (Fick) 6.7  Cardiac Index (Fick) 3 PVR 1.8 WU Cardiac Output (Thermo) 6.36  Cardiac Index (Thermo) 2.83  PVR 1.9 WU  Hospital Course     1. Acute on chronic systolic CHF: Nonischemic  cardiomyopathy.  Symptomatic since 9/21 (after episode of peri-tonsillar abscess).  He was a prior drinker (none since 9/21) but does not seem to describe heavy enough ETOH to cause profound cardiomyopathy (though he did have mild cirrhosis on abdominal US at [facility]).  No FH of cardiomyopathy.  Born in [place], has been in [place] x many years.  HIV negative 9/21.   -- Echo 11/21 with EF < 20% with severe LV dilation, moderate RVE with severely decreased systolic function, severe biatrial enlargement, mild MR. RHC/LHC 12/21 showed no coronary disease, markedly elevated filling pressures, and low cardiac output.  -- Cardiac MRI in 11/21 showed severely dilated LV with diffuse hypokinesis, EF 17%, mod dilated RV with EF 20%, diffuse mid-wall LGE in septum, inferior wall & inferolateral wall.  LGE pattern suggests possible prior myocarditis. Echo in 3/22 showed that EF remains < 20% with mild RV dysfunction.  CPX in 4/22 showed only a mild HF limitation.   -- RHC 6/10 showed preserved cardiac output but severely elevated filling pressures.  Diuresed well with IV lasix, and transitioned to torsemide 40mg  daily at time of discharge.   -- Continue Entresto to 97/103 mg bid.   -- Continue digoxin 0.125 daily -- Continue Toprol XL 100 mg daily. -- Continue spironolactone 25 mg daily. -- Continue Dapagliflozin 10 mg daily.   -- Continue ivabradine 5 mg bid.  -- With h/o neurocysticercosis, Trypanosoma cruzi antibody panel was sent per Dr. McLean=> cannot totally rule out CMP related to trypanosomiasis. This was pending at the time of discharge. -- seen by EP 6/2 with plans for subcutaneous ICD placement.  He is not a CRT candidate.  Plan to DC lifevest once ICD placed  2. 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  It was felt with his obesity could be NASH.  -- No further ETOH (abstinence does not appear to have helped LV EF, doubt ETOH is the major cause for low EF).  3. OSA: outpatient Cpap to be  arranged via [facility]  Patient was seen by Dr. Eden Emms and deemed stable for discharge home. Follow up in the AHF noted. Medication changes sent to patient preferred pharmacy.   Did the patient have an acute coronary syndrome (MI, NSTEMI, STEMI, etc) this admission?:  No                               Did the patient have a percutaneous coronary intervention (stent / angioplasty)?:  No.       _____________  Discharge Vitals Blood pressure 106/70, pulse 85, temperature 98.3 F (36.8 C), temperature source Oral, resp. rate 18, height 5\' 6"  (1.676 m), weight 114.7 kg, SpO2 97 %.  Filed Weights   12/08/20 1006 12/09/20 0545 12/10/20 0444  Weight: 119.3 kg 114.4 kg 114.7 kg    Labs & Radiologic Studies    CBC  Recent Labs    12/08/20 1229 12/08/20 1452  WBC  --  10.7*  NEUTROABS  --  7.8*  HGB 15.6  15.3 16.2  HCT 46.0  45.0 48.2  MCV  --  90.3  PLT  --  225   Basic Metabolic Panel Recent Labs    42/68/34 1452 12/09/20 0230 12/10/20 0133  NA  --  135 133*  K  --  3.4* 4.0  CL  --  98 99  CO2  --  27 25  GLUCOSE  --  119* 107*  BUN  --  12 19  CREATININE 0.81 0.81 0.92  CALCIUM  --  9.0 8.8*  MG 1.9  --   --    Liver Function Tests No results for input(s): AST, ALT, ALKPHOS, BILITOT, PROT, ALBUMIN in the last 72 hours. No results for input(s): LIPASE, AMYLASE in the last 72 hours. High Sensitivity Troponin:   Recent Labs  Lab 11/22/20 1020 11/22/20 1214  TROPONINIHS 40* 35*    BNP Invalid input(s): POCBNP D-Dimer No results for input(s): DDIMER in the last 72 hours. Hemoglobin A1C No results for input(s): HGBA1C in the last 72 hours. Fasting Lipid Panel No results for input(s): CHOL, HDL, LDLCALC, TRIG, CHOLHDL, LDLDIRECT in the last 72 hours. Thyroid Function Tests Recent Labs    12/08/20 1452  TSH 0.897   _____________  DG Chest 2 View  Result Date: 12/09/2020 CLINICAL DATA:  Congestive heart failure EXAM: CHEST - 2 VIEW COMPARISON:   11/22/2020 FINDINGS: Motion and arm position degraded lateral view. Midline trachea. Moderate cardiomegaly. No pleural effusion or pneumothorax. Significant improvement in pulmonary interstitial thickening. No lobar consolidation. IMPRESSION: Cardiomegaly with significantly improved pulmonary interstitial thickening. Residual mild interstitial thickening could represent venous congestion or the sequelae of smoking/chronic bronchitis (ex-smoker). Electronically Signed   By: Jeronimo Greaves M.D.   On: 12/09/2020 07:49   DG Chest 2 View  Result Date: 11/22/2020 CLINICAL DATA:  Chest pain and cough over the last several weeks. EXAM: CHEST - 2 VIEW COMPARISON:  05/29/2020 FINDINGS: Chronically enlarged cardiac silhouette. Mild thoracic tortuosity. Pulmonary venous hypertension with interstitial pulmonary edema and a small amount of fluid in the fissures. No infiltrate, collapse or measurable effusion. No significant bone finding. IMPRESSION: Congestive heart failure. Enlarged cardiac silhouette. Pulmonary venous hypertension. Interstitial pulmonary edema. Small amount of fluid in the fissures. Electronically Signed   By: Paulina Fusi M.D.   On: 11/22/2020 10:24   CARDIAC CATHETERIZATION  Result Date: 12/08/2020 1. Markedly elevated PCWP. 2. Moderate pulmonary venous hypertension. 3. Preserved cardiac output. I will admit him for diuresis.   SLEEP STUDY DOCUMENTS  Result Date: 11/20/2020 Ordered by an unspecified provider.  Disposition   Pt is being discharged home today in good condition.  Follow-up Plans & Appointments     Follow-up Information     [facility] Follow up on 12/27/2020.   Specialty: Cardiology Why: at 3pm for your follow up appt. Contact information: [address] [id number] mc [place] [address] [phone]               Discharge Instructions     (HEART FAILURE PATIENTS) Call MD:  Anytime you have any  of the following symptoms: 1) 3 pound weight gain in 24 hours or 5 pounds in 1 week 2) shortness of breath, with or without a dry hacking cough 3) swelling in the hands, feet or stomach 4) if you have to sleep  on extra pillows at night in order to breathe.   Complete by: As directed    Call MD for:  difficulty breathing, headache or visual disturbances   Complete by: As directed    Call MD for:  redness, tenderness, or signs of infection (pain, swelling, redness, odor or green/yellow discharge around incision site)   Complete by: As directed    Diet - low sodium heart healthy   Complete by: As directed    Increase activity slowly   Complete by: As directed        Discharge Medications   Allergies as of 12/10/2020       Reactions   Raspberry Anaphylaxis   Penicillins Rash   Reaction: Childhood        Medication List     STOP taking these medications    furosemide 40 MG tablet Commonly known as: LASIX       TAKE these medications    albuterol 108 (90 Base) MCG/ACT inhaler Commonly known as: VENTOLIN HFA Inhale 2 puffs into the lungs every 6 (six) hours as needed (Cough).   bisacodyl 5 MG EC tablet Commonly known as: DULCOLAX Take 10 mg by mouth daily as needed for mild constipation.   digoxin 0.125 MG tablet Commonly known as: LANOXIN TAKE 1 TABLET BY MOUTH DAILY   empagliflozin 10 MG Tabs tablet Commonly known as: Jardiance Take 1 tablet (10 mg total) by mouth daily before breakfast.   Entresto 97-103 MG Generic drug: sacubitril-valsartan Take 1 tablet by mouth 2 (two) times daily.   ivabradine 5 MG Tabs tablet Commonly known as: CORLANOR Take 1 tablet (5 mg total) by mouth 2 (two) times daily with a meal.   metoprolol succinate 100 MG 24 hr tablet Commonly known as: TOPROL-XL Take 1 tablet (100 mg total) by mouth daily. What changed: how much to take   Multi-Vitamin tablet Take 1 tablet by mouth daily.   potassium chloride SA 20 MEQ  tablet Commonly known as: KLOR-CON Take 40 mEq by mouth daily.   spironolactone 25 MG tablet Commonly known as: ALDACTONE Take 1 tablet (25 mg total) by mouth daily.   thiamine 100 MG tablet TAKE 1 TABLET BY MOUTH EVERY DAY   Torsemide 40 MG Tabs Take 40 mg by mouth daily. Start taking on: December 11, 2020        Outstanding Labs/Studies   BMET at follow up appt  Duration of Discharge Encounter   Greater than 30 minutes including physician time.  Signed, Laverda Page, NP 12/10/2020, 2:07 PM

## 2020-12-10 NOTE — Progress Notes (Signed)
Patient ID: Kyle Pearson, male   DOB: Oct 02, 1975, 45 y.o.   MRN: [id number]    Primary Cardiologist:  [facility]  Subjective:  Breathing improved good diuresis wants to go home   Objective:  Vitals:   12/10/20 0432 12/10/20 0433 12/10/20 0444 12/10/20 0837  BP:  101/80  106/70  Pulse:    85  Resp:  18    Temp:  98.3 F (36.8 C)    TempSrc:  Oral    SpO2: 97%     Weight:   114.7 kg   Height:        Intake/Output from previous day:  Intake/Output Summary (Last 24 hours) at 12/10/2020 1329 Last data filed at 12/10/2020 1139 Gross per 24 hour  Intake 720 ml  Output 3475 ml  Net -2755 ml    Physical Exam: Overweight Hispanic male Lungs clear Tattoos  JVP elevated no obvious murmur Plus one LE edema   Lab Results: Basic Metabolic Panel: Recent Labs    12/08/20 1452 12/09/20 0230 12/10/20 0133  NA  --  135 133*  K  --  3.4* 4.0  CL  --  98 99  CO2  --  27 25  GLUCOSE  --  119* 107*  BUN  --  12 19  CREATININE 0.81 0.81 0.92  CALCIUM  --  9.0 8.8*  MG 1.9  --   --    Liver Function Tests: No results for input(s): AST, ALT, ALKPHOS, BILITOT, PROT, ALBUMIN in the last 72 hours. No results for input(s): LIPASE, AMYLASE in the last 72 hours. CBC: Recent Labs    12/08/20 1229 12/08/20 1452  WBC  --  10.7*  NEUTROABS  --  7.8*  HGB 15.6  15.3 16.2  HCT 46.0  45.0 48.2  MCV  --  90.3  PLT  --  225    Thyroid Function Tests: Recent Labs    12/08/20 1452  TSH 0.897   Anemia Panel: No results for input(s): VITAMINB12, FOLATE, FERRITIN, TIBC, IRON, RETICCTPCT in the last 72 hours.  Imaging: DG Chest 2 View  Result Date: 12/09/2020 CLINICAL DATA:  Congestive heart failure EXAM: CHEST - 2 VIEW COMPARISON:  11/22/2020 FINDINGS: Motion and arm position degraded lateral view. Midline trachea. Moderate cardiomegaly. No pleural effusion or pneumothorax. Significant improvement in pulmonary interstitial thickening. No lobar consolidation.  IMPRESSION: Cardiomegaly with significantly improved pulmonary interstitial thickening. Residual mild interstitial thickening could represent venous congestion or the sequelae of smoking/chronic bronchitis (ex-smoker). Electronically Signed   By: Jeronimo Greaves M.D.   On: 12/09/2020 07:49    Cardiac Studies:  ECG:  Orders placed or performed during the hospital encounter of 12/08/20   EKG 12-Lead   EKG 12-Lead     Telemetry:  SR  Echo: March EF <20% RV dysfunction trivial MR  Medications:    digoxin  125 mcg Oral Daily   empagliflozin  10 mg Oral QAC breakfast   enoxaparin (LOVENOX) injection  40 mg Subcutaneous Q24H   ivabradine  5 mg Oral BID WC   metoprolol succinate  100 mg Oral Daily   multivitamin with minerals  1 tablet Oral Daily   potassium chloride SA  40 mEq Oral Daily   sacubitril-valsartan  1 tablet Oral BID   sodium chloride flush  3 mL Intravenous Q12H   sodium chloride flush  3 mL Intravenous Q12H   spironolactone  25 mg Oral Daily   thiamine  100 mg Oral Daily   torsemide  40  mg Oral Daily      sodium chloride     sodium chloride Stopped (12/08/20 1845)   sodium chloride      Assessment/Plan:   CHF:  nonischemic DCM. Admitted for iv diuresis Weight down on PO Demedex 40 mg now. Appears euvolemic except mild LE edema. Plans for subcutaneous AICD latter this month Has lifevest he will where at home Labs ok Needs home CPAP arranged Outpatient f/u with EP and CHF clinic to be arranged by Geoffry Paradise PA  Charlton Haws 12/10/2020, 1:29 PM

## 2020-12-11 ENCOUNTER — Encounter (HOSPITAL_COMMUNITY): Payer: Self-pay | Admitting: Cardiology

## 2020-12-11 MED FILL — Heparin Sod (Porcine)-NaCl IV Soln 1000 Unit/500ML-0.9%: INTRAVENOUS | Qty: 500 | Status: AC

## 2020-12-26 NOTE — Progress Notes (Deleted)
Error

## 2020-12-27 ENCOUNTER — Other Ambulatory Visit: Payer: Self-pay

## 2020-12-27 ENCOUNTER — Ambulatory Visit (HOSPITAL_COMMUNITY)
Admission: RE | Admit: 2020-12-27 | Discharge: 2020-12-27 | Disposition: A | Discharge status: Home / Self Care | Payer: 59 | Source: Ambulatory Visit | Attending: Internal Medicine | Admitting: Internal Medicine

## 2020-12-28 ENCOUNTER — Telehealth (HOSPITAL_COMMUNITY): Payer: Self-pay | Admitting: *Deleted

## 2020-12-28 NOTE — Telephone Encounter (Signed)
Hospital admission denied per [facility] with pre service center. Fax denial information to [place] at [phone]. Paperwork faxed.

## 2021-01-11 ENCOUNTER — Other Ambulatory Visit (HOSPITAL_COMMUNITY): Payer: Self-pay | Admitting: *Deleted

## 2021-01-11 MED ORDER — ENTRESTO 97-103 MG PO TABS: 1.0000 | ORAL_TABLET | Freq: Two times a day (BID) | ORAL | 3 refills | Status: DC

## 2021-01-15 ENCOUNTER — Other Ambulatory Visit: Payer: Self-pay

## 2021-01-15 ENCOUNTER — Telehealth (HOSPITAL_COMMUNITY): Payer: Self-pay | Admitting: Pharmacy Technician

## 2021-01-15 ENCOUNTER — Other Ambulatory Visit (HOSPITAL_COMMUNITY): Payer: Self-pay

## 2021-01-15 ENCOUNTER — Other Ambulatory Visit: Payer: BC Managed Care – PPO | Admitting: *Deleted

## 2021-01-15 DIAGNOSIS — I428 Other cardiomyopathies: Secondary | ICD-10-CM

## 2021-01-15 DIAGNOSIS — Z01812 Encounter for preprocedural laboratory examination: Secondary | ICD-10-CM

## 2021-01-15 LAB — CBC
Hematocrit: 41.6 % (ref 37.5–51.0)
Hemoglobin: 14 g/dL (ref 13.0–17.7)
MCH: 30.1 pg (ref 26.6–33.0)
MCHC: 33.7 g/dL (ref 31.5–35.7)
MCV: 90 fL (ref 79–97)
Platelets: 226 10*3/uL (ref 150–450)
RBC: 4.65 x10E6/uL (ref 4.14–5.80)
RDW: 15.2 % (ref 11.6–15.4)
WBC: 8.4 10*3/uL (ref 3.4–10.8)

## 2021-01-15 LAB — BASIC METABOLIC PANEL
BUN/Creatinine Ratio: 19 (ref 9–20)
BUN: 15 mg/dL (ref 6–24)
CO2: 30 mmol/L — ABNORMAL HIGH (ref 20–29)
Calcium: 9.6 mg/dL (ref 8.7–10.2)
Chloride: 101 mmol/L (ref 96–106)
Creatinine, Ser: 0.81 mg/dL (ref 0.76–1.27)
Glucose: 133 mg/dL — ABNORMAL HIGH (ref 65–99)
Potassium: 4 mmol/L (ref 3.5–5.2)
Sodium: 140 mmol/L (ref 134–144)
eGFR: 111 mL/min/{1.73_m2} (ref >59)

## 2021-01-15 NOTE — Telephone Encounter (Signed)
Advanced Heart Failure Patient Advocate Encounter  Patient called in stating that he could not afford Entresto. Had a co-pay card but did not know the information. Called and spoke with the patient, emailed co-pay card information. 30 day co-pay should be $10.  BIN [id number] PCN OHCP ID [id number] Group [id number]  Archer Asa, CPhT

## 2021-01-17 ENCOUNTER — Other Ambulatory Visit (HOSPITAL_COMMUNITY): Payer: Self-pay

## 2021-01-18 ENCOUNTER — Other Ambulatory Visit (HOSPITAL_COMMUNITY): Payer: Self-pay

## 2021-01-23 ENCOUNTER — Encounter (HOSPITAL_COMMUNITY): Payer: 59

## 2021-01-23 ENCOUNTER — Telehealth: Payer: Self-pay | Admitting: Cardiology

## 2021-01-23 ENCOUNTER — Encounter (HOSPITAL_COMMUNITY): Payer: Self-pay

## 2021-01-23 NOTE — Telephone Encounter (Signed)
[facility] called to get a prior authorization for the patient for his procedure on 7/29. Please advise

## 2021-01-24 ENCOUNTER — Telehealth: Payer: Self-pay | Admitting: *Deleted

## 2021-01-24 NOTE — Telephone Encounter (Signed)
Pt advised to arrive at 5:30 am for his procedure Friday. Patient verbalized understanding and agreeable to plan.

## 2021-01-25 NOTE — Pre-Procedure Instructions (Signed)
Instructed patient on the following items: Arrival time 0530 Nothing to eat or drink after midnight No meds AM of procedure Responsible person to drive you home and stay with you for 24 hrs Wash with special soap night before and morning of procedure  

## 2021-01-26 ENCOUNTER — Other Ambulatory Visit: Payer: Self-pay

## 2021-01-26 ENCOUNTER — Ambulatory Visit (HOSPITAL_COMMUNITY): Payer: BC Managed Care – PPO

## 2021-01-26 ENCOUNTER — Encounter (HOSPITAL_COMMUNITY): Payer: Self-pay | Admitting: Cardiology

## 2021-01-26 ENCOUNTER — Ambulatory Visit (HOSPITAL_COMMUNITY)
Admission: RE | Admit: 2021-01-26 | Discharge: 2021-01-26 | Disposition: A | Discharge status: Home / Self Care | Payer: BC Managed Care – PPO | Attending: Cardiology | Admitting: Cardiology

## 2021-01-26 ENCOUNTER — Ambulatory Visit (HOSPITAL_COMMUNITY): Payer: BC Managed Care – PPO | Admitting: Certified Registered Nurse Anesthetist

## 2021-01-26 ENCOUNTER — Encounter (HOSPITAL_COMMUNITY)
Admission: RE | Disposition: A | Discharge status: Home / Self Care | Payer: 59 | Source: Home / Self Care | Attending: Cardiology

## 2021-01-26 DIAGNOSIS — I428 Other cardiomyopathies: Secondary | ICD-10-CM | POA: Diagnosis not present

## 2021-01-26 DIAGNOSIS — Z880 Allergy status to penicillin: Secondary | ICD-10-CM | POA: Diagnosis not present

## 2021-01-26 DIAGNOSIS — I255 Ischemic cardiomyopathy: Secondary | ICD-10-CM | POA: Diagnosis not present

## 2021-01-26 DIAGNOSIS — Z95818 Presence of other cardiac implants and grafts: Secondary | ICD-10-CM

## 2021-01-26 DIAGNOSIS — Z87891 Personal history of nicotine dependence: Secondary | ICD-10-CM | POA: Diagnosis not present

## 2021-01-26 DIAGNOSIS — I5022 Chronic systolic (congestive) heart failure: Secondary | ICD-10-CM | POA: Diagnosis not present

## 2021-01-26 HISTORY — PX: SUBQ ICD IMPLANT: EP1223

## 2021-01-26 LAB — GLUCOSE, CAPILLARY: Glucose-Capillary: 132 mg/dL — ABNORMAL HIGH (ref 70–99)

## 2021-01-26 SURGERY — SUBQ ICD IMPLANT
Anesthesia: General

## 2021-01-26 MED ORDER — ACETAMINOPHEN 325 MG PO TABS: 325.0000 mg | ORAL_TABLET | ORAL | Status: DC | PRN

## 2021-01-26 MED ORDER — ONDANSETRON HCL 4 MG/2ML IJ SOLN: 4.0000 mg | Freq: Four times a day (QID) | INTRAMUSCULAR | Status: DC | PRN

## 2021-01-26 MED ORDER — VANCOMYCIN HCL IN DEXTROSE 1-5 GM/200ML-% IV SOLN
INTRAVENOUS | Status: AC
  Filled 2021-01-26: qty 200

## 2021-01-26 MED ORDER — LIDOCAINE HCL (PF) 1 % IJ SOLN: INTRAMUSCULAR | Status: DC | PRN

## 2021-01-26 MED ORDER — SODIUM CHLORIDE 0.9 % IV SOLN
80.0000 mg | INTRAVENOUS | Status: AC
  Administered 2021-01-26: 80 mg
  Filled 2021-01-26: qty 2

## 2021-01-26 MED ORDER — FENTANYL CITRATE (PF) 100 MCG/2ML IJ SOLN
25.0000 ug | INTRAMUSCULAR | Status: DC | PRN
  Administered 2021-01-26: 25 ug via INTRAVENOUS

## 2021-01-26 MED ORDER — DEXAMETHASONE SODIUM PHOSPHATE 10 MG/ML IJ SOLN
INTRAMUSCULAR | Status: DC | PRN
  Administered 2021-01-26: 5 mg via INTRAVENOUS

## 2021-01-26 MED ORDER — ONDANSETRON HCL 4 MG/2ML IJ SOLN
INTRAMUSCULAR | Status: DC | PRN
  Administered 2021-01-26: 4 mg via INTRAVENOUS

## 2021-01-26 MED ORDER — ETOMIDATE 2 MG/ML IV SOLN
INTRAVENOUS | Status: DC | PRN
  Administered 2021-01-26: 20 mg via INTRAVENOUS

## 2021-01-26 MED ORDER — BUPIVACAINE HCL (PF) 0.25 % IJ SOLN
INTRAMUSCULAR | Status: AC
  Filled 2021-01-26: qty 30

## 2021-01-26 MED ORDER — SODIUM CHLORIDE 0.9 % IV SOLN: INTRAVENOUS | Status: DC

## 2021-01-26 MED ORDER — SUGAMMADEX SODIUM 200 MG/2ML IV SOLN
INTRAVENOUS | Status: DC | PRN
  Administered 2021-01-26: 200 mg via INTRAVENOUS

## 2021-01-26 MED ORDER — FENTANYL CITRATE (PF) 100 MCG/2ML IJ SOLN
INTRAMUSCULAR | Status: DC | PRN
  Administered 2021-01-26: 100 ug via INTRAVENOUS

## 2021-01-26 MED ORDER — HEPARIN (PORCINE) IN NACL 1000-0.9 UT/500ML-% IV SOLN
INTRAVENOUS | Status: DC | PRN
  Administered 2021-01-26: 500 mL

## 2021-01-26 MED ORDER — SODIUM CHLORIDE 0.9 % IV SOLN
INTRAVENOUS | Status: AC
  Filled 2021-01-26: qty 2

## 2021-01-26 MED ORDER — HEPARIN (PORCINE) IN NACL 1000-0.9 UT/500ML-% IV SOLN
INTRAVENOUS | Status: AC
  Filled 2021-01-26: qty 500

## 2021-01-26 MED ORDER — FENTANYL CITRATE (PF) 100 MCG/2ML IJ SOLN
INTRAMUSCULAR | Status: AC
  Filled 2021-01-26: qty 2

## 2021-01-26 MED ORDER — VANCOMYCIN HCL 1500 MG/300ML IV SOLN
1500.0000 mg | INTRAVENOUS | Status: AC
  Administered 2021-01-26: 1000 mg via INTRAVENOUS
  Filled 2021-01-26: qty 300

## 2021-01-26 MED ORDER — BUPIVACAINE HCL (PF) 0.25 % IJ SOLN
INTRAMUSCULAR | Status: AC
  Filled 2021-01-26: qty 90

## 2021-01-26 MED ORDER — VANCOMYCIN HCL IN DEXTROSE 1-5 GM/200ML-% IV SOLN: 1000.0000 mg | Freq: Two times a day (BID) | INTRAVENOUS | Status: DC

## 2021-01-26 MED ORDER — PHENYLEPHRINE HCL-NACL 10-0.9 MG/250ML-% IV SOLN
INTRAVENOUS | Status: DC | PRN
  Administered 2021-01-26: 25 ug/min via INTRAVENOUS

## 2021-01-26 MED ORDER — ROCURONIUM BROMIDE 10 MG/ML (PF) SYRINGE
PREFILLED_SYRINGE | INTRAVENOUS | Status: DC | PRN
Start: 2021-01-26 — End: 2021-01-26
  Administered 2021-01-26: 20 mg via INTRAVENOUS
  Administered 2021-01-26: 60 mg via INTRAVENOUS
  Administered 2021-01-26: 10 mg via INTRAVENOUS

## 2021-01-26 MED ORDER — PROPOFOL 10 MG/ML IV BOLUS
INTRAVENOUS | Status: DC | PRN
  Administered 2021-01-26: 20 mg via INTRAVENOUS
  Administered 2021-01-26: 120 mg via INTRAVENOUS

## 2021-01-26 MED ORDER — OXYCODONE HCL 5 MG PO TABS
ORAL_TABLET | ORAL | Status: AC
  Filled 2021-01-26: qty 1

## 2021-01-26 MED ORDER — LIDOCAINE 2% (20 MG/ML) 5 ML SYRINGE
INTRAMUSCULAR | Status: DC | PRN
  Administered 2021-01-26: 60 mg via INTRAVENOUS

## 2021-01-26 MED ORDER — OXYCODONE HCL 5 MG/5ML PO SOLN: 5.0000 mg | Freq: Once | ORAL | Status: AC | PRN

## 2021-01-26 MED ORDER — BUPIVACAINE HCL (PF) 0.25 % IJ SOLN
INTRAMUSCULAR | Status: DC | PRN
  Administered 2021-01-26: 120 mL

## 2021-01-26 MED ORDER — SODIUM CHLORIDE 0.9 % IV SOLN: INTRAVENOUS | Status: DC | PRN

## 2021-01-26 MED ORDER — OXYCODONE HCL 5 MG PO TABS
5.0000 mg | ORAL_TABLET | Freq: Once | ORAL | Status: AC | PRN
  Administered 2021-01-26: 5 mg via ORAL

## 2021-01-26 SURGICAL SUPPLY — 4 items
ICD SUBCU MRI EMBLEM A219 (ICD Generator) ×2 IMPLANT
LEAD SUBQU EMBLEM 3501 (Pacemaker) ×2 IMPLANT
PAD PRO RADIOLUCENT 2001M-C (PAD) ×4 IMPLANT
TRAY PACEMAKER INSERTION (PACKS) ×2 IMPLANT

## 2021-01-26 NOTE — Transfer of Care (Signed)
Immediate Anesthesia Transfer of Care Note  Patient: Chivas Notz  Procedure(s) Performed: SUBQ ICD IMPLANT  Patient Location: PACU and Cath Lab  Anesthesia Type:General  Level of Consciousness: drowsy and patient cooperative  Airway & Oxygen Therapy: Patient Spontanous Breathing and Patient connected to face mask oxygen  Post-op Assessment: Report given to RN, Post -op Vital signs reviewed and stable and Patient moving all extremities  Post vital signs: Reviewed and stable  Last Vitals:  Vitals Value Taken Time  BP 126/75 01/26/21 1012  Temp    Pulse 86 01/26/21 1015  Resp 18 01/26/21 1015  SpO2 95 % 01/26/21 1015  Vitals shown include unvalidated device data.  Last Pain:  Vitals:   01/26/21 1006  TempSrc: Temporal  PainSc: Asleep         Complications: There were no known notable events for this encounter.

## 2021-01-26 NOTE — Anesthesia Procedure Notes (Signed)
Procedure Name: Intubation Date/Time: 01/26/2021 7:42 AM Performed by: Lowella Dell, CRNA Pre-anesthesia Checklist: Patient identified, Emergency Drugs available, Suction available and Patient being monitored Patient Re-evaluated:Patient Re-evaluated prior to induction Oxygen Delivery Method: Circle System Utilized Preoxygenation: Pre-oxygenation with 100% oxygen Induction Type: IV induction Ventilation: Two handed mask ventilation required and Oral airway inserted - appropriate to patient size Laryngoscope Size: Mac and 4 Grade View: Grade I Tube type: Oral Tube size: 7.5 mm Number of attempts: 2 Airway Equipment and Method: Stylet and Oral airway Placement Confirmation: ETT inserted through vocal cords under direct vision, positive ETCO2 and breath sounds checked- equal and bilateral Secured at: 23 cm Tube secured with: Tape Dental Injury: Teeth and Oropharynx as per pre-operative assessment  Comments: 1st attempt esophageal intubation, immediately recognized and ETT withdrawn.  2nd attempt tracheal intubation without difficulty.

## 2021-01-26 NOTE — Discharge Instructions (Signed)
Post procedure wound care instructions Keep incision clean and dry, no showers until cleared to at your wound check visit. No driving for 2 days.  You can remove outer PLASTIC dressings tomorrow. Leave steri-strips (little pieces of tape) on until seen in the office for wound check appointment. Avoid vigorous use of your left arm/activities until incisions are completely healed Call the office [phone]) for redness, drainage, swelling, or fever.

## 2021-01-26 NOTE — Progress Notes (Signed)
Client c/o left hand being numb; Dr Elberta Fortis in and notified and no new orders noted

## 2021-01-26 NOTE — Progress Notes (Signed)
Client c/o cont numbness left hand; Dr Elberta Fortis notified and in to see client; no new orders

## 2021-01-26 NOTE — Anesthesia Preprocedure Evaluation (Signed)
Anesthesia Evaluation  Patient identified by MRN, date of birth, ID band Patient awake    Reviewed: Allergy & Precautions, H&P , NPO status , Patient's Chart, lab work & pertinent test results  Airway Mallampati: II   Neck ROM: full    Dental   Pulmonary sleep apnea , former smoker,    breath sounds clear to auscultation       Cardiovascular +CHF   Rhythm:regular Rate:Normal  NICM with EF 20%   Neuro/Psych    GI/Hepatic   Endo/Other  Morbid obesity  Renal/GU      Musculoskeletal   Abdominal   Peds  Hematology   Anesthesia Other Findings   Reproductive/Obstetrics                             Anesthesia Physical Anesthesia Plan  ASA: 3  Anesthesia Plan: General   Post-op Pain Management:    Induction: Intravenous  PONV Risk Score and Plan: 2 and Ondansetron, Dexamethasone, Midazolam and Treatment may vary due to age or medical condition  Airway Management Planned: Oral ETT  Additional Equipment: Arterial line  Intra-op Plan:   Post-operative Plan: Extubation in OR  Informed Consent: I have reviewed the patients History and Physical, chart, labs and discussed the procedure including the risks, benefits and alternatives for the proposed anesthesia with the patient or authorized representative who has indicated his/her understanding and acceptance.     Dental advisory given  Plan Discussed with: CRNA, Anesthesiologist and Surgeon  Anesthesia Plan Comments:         Anesthesia Quick Evaluation

## 2021-01-26 NOTE — H&P (Signed)
Electrophysiology Office Note   Date:  01/26/2021   ID:  Kyle Pearson, DOB [birth year], MRN [id number]  PCP:  Patient, No Pcp Per (Inactive)  Cardiologist:  Shirlee Latch Primary Electrophysiologist:  Doralyn Kirkes Jorja Loa, MD    Chief Complaint: CHF   History of Present Illness: Kyle Pearson is a 45 y.o. male who is being seen today for the evaluation of CHF at the request of No ref. provider found. Presenting today for electrophysiology evaluation.  He has a history significant for tobacco abuse, alcohol abuse, and CHF.  He presented to [facility]/24/2020 with shortness of breath and abdominal distention as well as left-sided chest pain.  Echo showed an ejection fraction of 15 to 20% with severe LVH.  His BMP was 562.  He presented to his PCP 05/29/2020 was found to have shortness of breath and bilateral lower extremity edema.  He had a 30 pound weight gain.  He was sent to the hospital with a BNP of 1020.  Echo showed an ejection fraction of less than 20%.  He was admitted 05/29/2020 to 06/06/2020 with marked volume overload.  Left heart catheterization showed no evidence of coronary artery disease.  Cardiac MRI showed a severely reduced EF with a noncoronary extensive scar pattern.  Repeat echo unfortunately showed his ejection fraction to continue to be low.  Today, he denies symptoms of palpitations, chest pain, shortness of breath, orthopnea, PND, lower extremity edema, claudication, dizziness, presyncope, syncope, bleeding, or neurologic sequela. The patient is tolerating medications without difficulties.    Past Medical History:  Diagnosis Date   CHF (congestive heart failure) [facility])    Past Surgical History:  Procedure Laterality Date   RIGHT HEART CATH N/A 12/08/2020   Procedure: RIGHT HEART CATH;  Surgeon: Laurey Morale, MD;  Location: [facility] INVASIVE CV LAB;  Service: Cardiovascular;  Laterality: N/A;   RIGHT/LEFT HEART CATH AND CORONARY ANGIOGRAPHY N/A  06/01/2020   Procedure: RIGHT/LEFT HEART CATH AND CORONARY ANGIOGRAPHY;  Surgeon: Laurey Morale, MD;  Location: [facility] INVASIVE CV LAB;  Service: Cardiovascular;  Laterality: N/A;     Current Facility-Administered Medications  Medication Dose Route Frequency Provider Last Rate Last Admin   0.9 %  sodium chloride infusion   Intravenous Continuous Regan Lemming, MD 50 mL/hr at 01/26/21 0659 New Bag at 01/26/21 0659   gentamicin (GARAMYCIN) 80 mg in sodium chloride 0.9 % 500 mL irrigation  80 mg Irrigation On Call Juvenal Umar Daphine Deutscher, MD       vancomycin (VANCOREADY) IVPB 1500 mg/300 mL  1,500 mg Intravenous On Call Regan Lemming, MD        Allergies:   Raspberry and Penicillins   Social History:  The patient  reports that he quit smoking about 2 years ago. He has never used smokeless tobacco. He reports previous alcohol use. He reports that he does not use drugs.   Family History:  The patient's family history includes Diabetes in his mother and sister; Stroke in his father.    ROS:  Please see the history of present illness.   Otherwise, review of systems is positive for none.   All other systems are reviewed and negative.    PHYSICAL EXAM: VS:  BP 123/79   Pulse 98   Temp 98.2 F (36.8 C) (Oral)   Ht 5\' 6"  (1.676 m)   Wt 118.8 kg   SpO2 98%   BMI 42.29 kg/m  , BMI Body mass index is 42.29 kg/m. GEN:  Well nourished, well developed, in no acute distress  HEENT: normal  Neck: no JVD, carotid bruits, or masses Cardiac: RRR; no murmurs, rubs, or gallops,no edema  Respiratory:  clear to auscultation bilaterally, normal work of breathing GI: soft, nontender, nondistended, + BS MS: no deformity or atrophy  Skin: warm and dry Neuro:  Strength and sensation are intact Psych: euthymic mood, full affect  EKG:  EKG is not ordered today. Personal review of the ekg ordered 11/22/20 shows sinus tachycardia, rate 120  Recent Labs: 05/30/2020: ALT 27 12/08/2020: B  Natriuretic Peptide 774.1; Magnesium 1.9; TSH 0.897 01/15/2021: BUN 15; Creatinine, Ser 0.81; Hemoglobin 14.0; Platelets 226; Potassium 4.0; Sodium 140    Lipid Panel  No results found for: CHOL, TRIG, HDL, CHOLHDL, VLDL, LDLCALC, LDLDIRECT   Wt Readings from Last 3 Encounters:  01/26/21 118.8 kg  12/10/20 114.7 kg  11/30/20 119.3 kg      Other studies Reviewed: Additional studies/ records that were reviewed today include: TTE 09/27/20  Review of the above records today demonstrates:   1. Left ventricular ejection fraction, by estimation, is <20%. The left  ventricle has severely decreased function. The left ventricle demonstrates  global hypokinesis. The left ventricular internal cavity size was  moderately dilated. Left ventricular  diastolic parameters are consistent with Grade II diastolic dysfunction  (pseudonormalization).   2. Right ventricular systolic function is moderately reduced. The right  ventricular size is moderately enlarged. There is moderately elevated  pulmonary artery systolic pressure. The estimated right ventricular  systolic pressure is 49.3 mmHg.   3. Left atrial size was moderately dilated.   4. Right atrial size was mildly dilated.   5. The mitral valve is normal in structure. Trivial mitral valve  regurgitation. No evidence of mitral stenosis.   6. The aortic valve is tricuspid. Aortic valve regurgitation is not  visualized. No aortic stenosis is present.   7. The inferior vena cava is dilated in size with <50% respiratory  variability, suggesting right atrial pressure of 15 mmHg.   RHC/LHC 06/01/20 1. Markedly elevated right and left heart filling pressures.  2. Pulmonary venous hypertension.  3. Low cardiac output.  4. No significant coronary disease (minimal contrast given).   CMRI 06/05/20 1.  Severely dilated LV with diffuse hypokinesis, EF 17%.   2.  Moderately dilated RV with EF 20%.   3. Diffuse mid-wall LGE in the septum, inferior wall,  and inferolateral wall.   Consider prior myocarditis with diffuse mid-wall LGE. T2 not elevated, but could be old myocarditis.  ASSESSMENT AND PLAN:  1.  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 Within 12 months prior to implant: Yes   Heart failure history: Yes, Class II  Cardiomyopathy history: Yes, Non-Ischemic Cardiomyopathy.  Atrial Fibrillation/Atrial Flutter: No.  Ventricular tachycardia history: No.  Cardiac arrest history: No.  History of syndromes with risk of sudden death: No.  Previous ICD: No.  Current ICD indication: Primary  PPM indication: No.  Class I or II Bradycardia indication present: No  Beta Blocker therapy for 3 or more months: Yes, prescribed.   Ace Inhibitor/ARB therapy for 3 or more months: Yes, prescribed.    I have seen Kyle Pearson is a 45 y.o. malepre-procedural and has been referred by Shirlee Latch for consideration of ICD implant for primary prevention of sudden death.  The patient's chart has been reviewed and they meet criteria for ICD implant.  I have had a thorough discussion with  the patient reviewing options.  The patient and their family (if available) have had opportunities to ask questions and have them answered. The patient and I have decided together through the [facility] Care Share Decision Support Tool to implant ICD at this time.  Risks, benefits, alternatives to ICD implantation were discussed in detail with the patient today. The patient  understands that the risks include but are not limited to bleeding, infection, pneumothorax, perforation, tamponade, vascular damage, renal failure, MI, stroke, death, inappropriate shocks, and lead dislodgement and wishes to proceed.

## 2021-01-27 NOTE — Anesthesia Postprocedure Evaluation (Signed)
Anesthesia Post Note  Patient: Kyle Pearson  Procedure(s) Performed: SUBQ ICD IMPLANT     Patient location during evaluation: Cath Lab Anesthesia Type: General Level of consciousness: awake and alert Pain management: pain level controlled Vital Signs Assessment: post-procedure vital signs reviewed and stable Respiratory status: spontaneous breathing, nonlabored ventilation, respiratory function stable and patient connected to nasal cannula oxygen Cardiovascular status: blood pressure returned to baseline and stable Postop Assessment: no apparent nausea or vomiting Anesthetic complications: no   There were no known notable events for this encounter.  Last Vitals:  Vitals:   01/26/21 1115 01/26/21 1130  BP: 115/83 113/76  Pulse: 88 92  Resp: 17 14  Temp:    SpO2: 96% 94%    Last Pain:  Vitals:   01/26/21 1200  TempSrc:   PainSc: 3                  Craige Patel S

## 2021-01-29 ENCOUNTER — Telehealth: Payer: Self-pay

## 2021-01-29 NOTE — Telephone Encounter (Signed)
-----   Message from [facility], [place] sent at 01/26/2021 12:25 PM EDT ----- BSCi SQ ICD  Same day d/c  WC

## 2021-01-29 NOTE — Telephone Encounter (Signed)
Attempted to reach pt, no answer.  LVM requesting patient call back with device clinic # and hours.     Follow-up after same day discharge: Implant date: 01/26/21 MD: Dr. Elberta Fortis Device: BSX S-ICD Location: Left Axillary   Wound check visit: 02/07/21 90 day MD follow-up: 05/01/21  Remote Transmission received:yes  Dressing removed: TBD

## 2021-01-30 NOTE — Telephone Encounter (Signed)
Transmission was received 01/29/21. Dressing was removed with no signs of swelling, redness, warmth, drainage, fever or chills. Patient advised to call with any further questions or concerns. Direct phone number given.

## 2021-02-07 ENCOUNTER — Ambulatory Visit: Payer: BC Managed Care – PPO

## 2021-02-14 ENCOUNTER — Other Ambulatory Visit: Payer: Self-pay

## 2021-02-14 ENCOUNTER — Ambulatory Visit (INDEPENDENT_AMBULATORY_CARE_PROVIDER_SITE_OTHER): Payer: 59

## 2021-02-14 DIAGNOSIS — I428 Other cardiomyopathies: Secondary | ICD-10-CM | POA: Diagnosis not present

## 2021-02-14 LAB — CUP PACEART INCLINIC DEVICE CHECK
Date Time Interrogation Session: 20220817150938
Implantable Lead Implant Date: 20220729
Implantable Lead Location: 753862
Implantable Lead Model: 3501
Implantable Lead Serial Number: 213685
Implantable Pulse Generator Implant Date: 20220729
Pulse Gen Serial Number: 163995

## 2021-02-14 NOTE — Progress Notes (Signed)
Wound check in-clinic. Wound appears well healed. No redness, swelling or drainage noted. Subcutaneous ICD check in clinic. 0 untreated episodes; 0 treated episodes; 0 shocks delivered. Electrode impedance status okay. No programming changes. Remaining longevity to ERI 100%.  See scanned report. Error in saving.

## 2021-02-14 NOTE — Patient Instructions (Addendum)
   After Your ICD (Implantable Cardiac Defibrillator)    Monitor your defibrillator site for redness, swelling, and drainage. Call the device clinic at [phone] if you experience these symptoms or fever/chills.  Your incision was closed with Dermabond:  You may shower 1 day after your defibrillator implant and wash your incision with soap and water. Avoid lotions, ointments, or perfumes over your incision until it is well-healed.  You may use a hot tub or a pool after your wound check appointment if the incision is completely closed.  Do not lift, push or pull greater than 10 pounds with the affected arm until 6 weeks after your procedure. There are no other restrictions in arm movement after your wound check appointment.   Your ICD is designed to protect you from life threatening heart rhythms. Because of this, you may receive a shock.   1 shock with no symptoms:  Call the office during business hours. 1 shock with symptoms (chest pain, chest pressure, dizziness, lightheadedness, shortness of breath, overall feeling unwell):  Call 911. If you experience 2 or more shocks in 24 hours:  Call 911. If you receive a shock, you should not drive.  [place] DMV - no driving for 6 months if you receive appropriate therapy from your ICD.   ICD Alerts:  Some alerts are vibratory and others beep. These are NOT emergencies. Please call our office to let us know. If this occurs at night or on weekends, it can wait until the next business day. Send a remote transmission.   Remote monitoring is used to monitor your ICD from home. This monitoring is scheduled every 91 days by our office. It allows [place] to keep an eye on the functioning of your device to ensure it is working properly. You will routinely see your Electrophysiologist annually (more often if necessary).   .h

## 2021-02-15 ENCOUNTER — Other Ambulatory Visit: Payer: Self-pay | Admitting: Cardiology

## 2021-02-16 ENCOUNTER — Other Ambulatory Visit (HOSPITAL_COMMUNITY): Payer: Self-pay

## 2021-02-18 ENCOUNTER — Other Ambulatory Visit (HOSPITAL_COMMUNITY): Payer: Self-pay | Admitting: Cardiology

## 2021-02-20 ENCOUNTER — Other Ambulatory Visit: Payer: Self-pay | Admitting: Cardiology

## 2021-03-06 ENCOUNTER — Other Ambulatory Visit (HOSPITAL_COMMUNITY): Payer: Self-pay | Admitting: Family Medicine

## 2021-05-01 ENCOUNTER — Encounter: Payer: BC Managed Care – PPO | Admitting: Cardiology

## 2021-05-01 NOTE — Progress Notes (Deleted)
Electrophysiology Office Note   Date:  05/01/2021   ID:  Kyle Pearson, DOB 04-10-1976, MRN [id number]  PCP:  Patient, No Pcp Per (Inactive)  Cardiologist:  Shirlee Latch Primary Electrophysiologist:  Mosetta Ferdinand Jorja Loa, MD    Chief Complaint: CHF   History of Present Illness: Kyle Pearson is a 45 y.o. male who is being seen today for the evaluation of CHF at the request of No ref. provider found. Presenting today for electrophysiology evaluation.  He has a history significant for tobacco abuse, alcohol abuse, CHF.  He presented to [facility] 04/24/2020 with shortness of breath and abdominal distention as well as left-sided chest pain.  Echo showed an ejection fraction of 15 to 20% and severe LVH.  BNP was found to be 562.  He presented to his PCPs office 05/29/2020 was found to have shortness of breath and bilateral lower extremity edema and a 30 pound weight gain.  He was admitted to the hospital.  Left heart catheterization showed no evidence of coronary artery disease.  Cardiac MRI showed a severely reduced ejection fraction with a noncoronary scar pattern.  He is now status post S ICD implant 01/26/2021.  Today, denies symptoms of palpitations, chest pain, shortness of breath, orthopnea, PND, lower extremity edema, claudication, dizziness, presyncope, syncope, bleeding, or neurologic sequela. The patient is tolerating medications without difficulties. ***    Past Medical History:  Diagnosis Date   CHF (congestive heart failure) (HCC)    Past Surgical History:  Procedure Laterality Date   RIGHT HEART CATH N/A 12/08/2020   Procedure: RIGHT HEART CATH;  Surgeon: Laurey Morale, MD;  Location: [facility] INVASIVE CV LAB;  Service: Cardiovascular;  Laterality: N/A;   RIGHT/LEFT HEART CATH AND CORONARY ANGIOGRAPHY N/A 06/01/2020   Procedure: RIGHT/LEFT HEART CATH AND CORONARY ANGIOGRAPHY;  Surgeon: Laurey Morale, MD;  Location: [facility] INVASIVE CV LAB;  Service: Cardiovascular;   Laterality: N/A;   SUBQ ICD IMPLANT N/A 01/26/2021   Procedure: SUBQ ICD IMPLANT;  Surgeon: Regan Lemming, MD;  Location: [facility] INVASIVE CV LAB;  Service: Cardiovascular;  Laterality: N/A;     Current Outpatient Medications  Medication Sig Dispense Refill   albuterol (VENTOLIN HFA) 108 (90 Base) MCG/ACT inhaler Inhale 2 puffs into the lungs every 6 (six) hours as needed (Cough).     bisacodyl (DULCOLAX) 5 MG EC tablet Take 10 mg by mouth daily as needed for mild constipation.     digoxin (LANOXIN) 0.125 MG tablet TAKE 1 TABLET BY MOUTH DAILY 90 tablet 3   ivabradine (CORLANOR) 5 MG TABS tablet Take 1 tablet (5 mg total) by mouth 2 (two) times daily with a meal. 180 tablet 3   JARDIANCE 10 MG TABS tablet TAKE 1 TABLET BY MOUTH DAILY BEFORE BREAKFAST. 90 tablet 3   KLOR-CON M20 20 MEQ tablet TAKE 2 TABLETS BY MOUTH DAILY 180 tablet 0   metoprolol succinate (TOPROL-XL) 100 MG 24 hr tablet Take 1 tablet (100 mg total) by mouth daily. 30 tablet 11   Multiple Vitamin (MULTI-VITAMIN) tablet Take 1 tablet by mouth daily.     sacubitril-valsartan (ENTRESTO) 97-103 MG Take 1 tablet by mouth 2 (two) times daily. 180 tablet 3   spironolactone (ALDACTONE) 25 MG tablet TAKE 1 TABLET (25 MG TOTAL) BY MOUTH DAILY. 90 tablet 2   thiamine 100 MG tablet TAKE 1 TABLET BY MOUTH EVERY DAY 30 tablet 11   torsemide 40 MG TABS Take 40 mg by mouth daily. 90 tablet 1  No current facility-administered medications for this visit.    Allergies:   Raspberry and Penicillins   Social History:  The patient  reports that he quit smoking about 2 years ago. He has never used smokeless tobacco. He reports that he does not currently use alcohol. He reports that he does not use drugs.   Family History:  The patient's family history includes Diabetes in his mother and sister; Stroke in his father.   ROS:  Please see the history of present illness.   Otherwise, review of systems is positive for none.   All other systems are  reviewed and negative.   PHYSICAL EXAM: VS:  There were no vitals taken for this visit. , BMI There is no height or weight on file to calculate BMI. GEN: Well nourished, well developed, in no acute distress  HEENT: normal  Neck: no JVD, carotid bruits, or masses Cardiac: ***RRR; no murmurs, rubs, or gallops,no edema  Respiratory:  clear to auscultation bilaterally, normal work of breathing GI: soft, nontender, nondistended, + BS MS: no deformity or atrophy  Skin: warm and dry, device site well healed Neuro:  Strength and sensation are intact Psych: euthymic mood, full affect  EKG:  EKG {ACTION; IS/IS [id number]} ordered today. Personal review of the ekg ordered *** shows ***  Personal review of the device interrogation today. Results in [facility]   Recent Labs: 05/30/2020: ALT 27 12/08/2020: B Natriuretic Peptide 774.1; Magnesium 1.9; TSH 0.897 01/15/2021: BUN 15; Creatinine, Ser 0.81; Hemoglobin 14.0; Platelets 226; Potassium 4.0; Sodium 140    Lipid Panel  No results found for: CHOL, TRIG, HDL, CHOLHDL, VLDL, LDLCALC, LDLDIRECT   Wt Readings from Last 3 Encounters:  01/26/21 262 lb (118.8 kg)  12/10/20 252 lb 12.8 oz (114.7 kg)  11/30/20 263 lb (119.3 kg)      Other studies Reviewed: Additional studies/ records that were reviewed today include: TTE 09/27/20  Review of the above records today demonstrates:   1. Left ventricular ejection fraction, by estimation, is <20%. The left  ventricle has severely decreased function. The left ventricle demonstrates  global hypokinesis. The left ventricular internal cavity size was  moderately dilated. Left ventricular  diastolic parameters are consistent with Grade II diastolic dysfunction  (pseudonormalization).   2. Right ventricular systolic function is moderately reduced. The right  ventricular size is moderately enlarged. There is moderately elevated  pulmonary artery systolic pressure. The estimated right ventricular   systolic pressure is 49.3 mmHg.   3. Left atrial size was moderately dilated.   4. Right atrial size was mildly dilated.   5. The mitral valve is normal in structure. Trivial mitral valve  regurgitation. No evidence of mitral stenosis.   6. The aortic valve is tricuspid. Aortic valve regurgitation is not  visualized. No aortic stenosis is present.   7. The inferior vena cava is dilated in size with <50% respiratory  variability, suggesting right atrial pressure of 15 mmHg.   RHC/LHC 06/01/20 1. Markedly elevated right and left heart filling pressures.  2. Pulmonary venous hypertension.  3. Low cardiac output.  4. No significant coronary disease (minimal contrast given).   CMRI 06/05/20 1.  Severely dilated LV with diffuse hypokinesis, EF 17%.   2.  Moderately dilated RV with EF 20%.   3. Diffuse mid-wall LGE in the septum, inferior wall, and inferolateral wall.   Consider prior myocarditis with diffuse mid-wall LGE. T2 not elevated, but could be old myocarditis.  ASSESSMENT AND PLAN:  1.  Chronic systolic  heart failure secondary to nonischemic cardiomyopathy: Currently on optimal medical therapy with Entresto, Lasix, Toprol-XL, Aldactone, ivabradine, Farxiga.  He is status post [place] Jude S ICD implanted at 01/26/2021.  Device imaging appropriately.  No changes.***  2.  Alcohol abuse: Complete cessation encouraged  3.  Obstructive sleep apnea: CPAP compliance encouraged  Current medicines are reviewed at length with the patient today.   The patient does not have concerns regarding his medicines.  The following changes were made today:  ***  Labs/ tests ordered today include:  No orders of the defined types were placed in this encounter.    Disposition:   FU with Normand Damron*** months  Signed, Polette Nofsinger Jorja Loa, MD  05/01/2021 1:49 PM     [facility] [address] [place] [address] [phone] (office) [phone] (fax)

## 2021-05-20 ENCOUNTER — Other Ambulatory Visit: Payer: Self-pay | Admitting: Cardiology

## 2021-06-27 ENCOUNTER — Other Ambulatory Visit: Payer: Self-pay | Admitting: Cardiology

## 2021-09-15 ENCOUNTER — Other Ambulatory Visit (HOSPITAL_COMMUNITY): Payer: Self-pay | Admitting: Cardiology

## 2022-01-16 ENCOUNTER — Other Ambulatory Visit (HOSPITAL_COMMUNITY): Payer: Self-pay | Admitting: Cardiology

## 2022-01-21 ENCOUNTER — Other Ambulatory Visit: Payer: Self-pay | Admitting: Cardiology

## 2022-03-01 ENCOUNTER — Other Ambulatory Visit (HOSPITAL_COMMUNITY): Payer: Self-pay | Admitting: Cardiology

## 2022-03-15 ENCOUNTER — Telehealth (HOSPITAL_COMMUNITY): Payer: Self-pay | Admitting: Cardiology

## 2022-03-15 ENCOUNTER — Other Ambulatory Visit (HOSPITAL_COMMUNITY): Payer: Self-pay | Admitting: Cardiology

## 2022-03-15 MED ORDER — TORSEMIDE 40 MG PO TABS: 40.0000 mg | ORAL_TABLET | Freq: Every day | ORAL | 1 refills | Status: DC

## 2022-03-15 MED ORDER — DIGOXIN 125 MCG PO TABS: 125.0000 ug | ORAL_TABLET | Freq: Every day | ORAL | 3 refills | Status: DC

## 2022-03-15 MED ORDER — IVABRADINE HCL 5 MG PO TABS: 5.0000 mg | ORAL_TABLET | Freq: Two times a day (BID) | ORAL | 3 refills | Status: DC

## 2022-03-15 MED ORDER — METOPROLOL SUCCINATE ER 100 MG PO TB24: 100.0000 mg | ORAL_TABLET | Freq: Every day | ORAL | 11 refills | Status: DC

## 2022-03-15 MED ORDER — POTASSIUM CHLORIDE CRYS ER 20 MEQ PO TBCR: 40.0000 meq | EXTENDED_RELEASE_TABLET | Freq: Every day | ORAL | 0 refills | Status: DC

## 2022-03-15 MED ORDER — SPIRONOLACTONE 25 MG PO TABS: 25.0000 mg | ORAL_TABLET | Freq: Every day | ORAL | 2 refills | Status: DC

## 2022-03-15 MED ORDER — ENTRESTO 97-103 MG PO TABS: 1.0000 | ORAL_TABLET | Freq: Two times a day (BID) | ORAL | 3 refills | Status: DC

## 2022-03-15 NOTE — Telephone Encounter (Signed)
Meds refilled as requested and follow up appt scheduled

## 2022-04-02 ENCOUNTER — Telehealth (HOSPITAL_COMMUNITY): Payer: Self-pay

## 2022-04-02 NOTE — Telephone Encounter (Signed)
Called and left patient a voice message to confirm/remind patient of their appointment at the [facility] on 04/03/22.

## 2022-04-03 ENCOUNTER — Encounter (HOSPITAL_COMMUNITY): Payer: 59

## 2022-04-17 ENCOUNTER — Telehealth (HOSPITAL_COMMUNITY): Payer: Self-pay

## 2022-04-17 NOTE — Telephone Encounter (Signed)
Called and left patient a voice message to confirm/remind patient of their appointment at the [facility] on 04/18/22.    

## 2022-04-18 ENCOUNTER — Telehealth (HOSPITAL_COMMUNITY): Payer: Self-pay | Admitting: *Deleted

## 2022-04-18 ENCOUNTER — Other Ambulatory Visit (HOSPITAL_COMMUNITY): Payer: Self-pay

## 2022-04-18 ENCOUNTER — Encounter (HOSPITAL_COMMUNITY): Payer: Self-pay

## 2022-04-18 ENCOUNTER — Ambulatory Visit (HOSPITAL_COMMUNITY)
Admission: RE | Admit: 2022-04-18 | Discharge: 2022-04-18 | Disposition: A | Discharge status: Home / Self Care | Payer: 59 | Source: Ambulatory Visit | Attending: Adult Health | Admitting: Adult Health

## 2022-04-18 VITALS — BP 114/76 | HR 105 | Wt 302.4 lb

## 2022-04-18 DIAGNOSIS — E669 Obesity, unspecified: Secondary | ICD-10-CM | POA: Diagnosis not present

## 2022-04-18 DIAGNOSIS — K828 Other specified diseases of gallbladder: Secondary | ICD-10-CM | POA: Insufficient documentation

## 2022-04-18 DIAGNOSIS — Z7901 Long term (current) use of anticoagulants: Secondary | ICD-10-CM | POA: Insufficient documentation

## 2022-04-18 DIAGNOSIS — G4733 Obstructive sleep apnea (adult) (pediatric): Secondary | ICD-10-CM | POA: Insufficient documentation

## 2022-04-18 DIAGNOSIS — I428 Other cardiomyopathies: Secondary | ICD-10-CM | POA: Insufficient documentation

## 2022-04-18 DIAGNOSIS — K7460 Unspecified cirrhosis of liver: Secondary | ICD-10-CM | POA: Diagnosis not present

## 2022-04-18 DIAGNOSIS — Z87891 Personal history of nicotine dependence: Secondary | ICD-10-CM | POA: Insufficient documentation

## 2022-04-18 DIAGNOSIS — I5022 Chronic systolic (congestive) heart failure: Secondary | ICD-10-CM | POA: Insufficient documentation

## 2022-04-18 DIAGNOSIS — Z6841 Body Mass Index (BMI) 40.0 and over, adult: Secondary | ICD-10-CM | POA: Insufficient documentation

## 2022-04-18 DIAGNOSIS — Z79899 Other long term (current) drug therapy: Secondary | ICD-10-CM | POA: Diagnosis not present

## 2022-04-18 DIAGNOSIS — R0602 Shortness of breath: Secondary | ICD-10-CM | POA: Insufficient documentation

## 2022-04-18 DIAGNOSIS — I3139 Other pericardial effusion (noninflammatory): Secondary | ICD-10-CM | POA: Insufficient documentation

## 2022-04-18 LAB — BASIC METABOLIC PANEL
Anion gap: 8 (ref 5–15)
BUN: 10 mg/dL (ref 6–20)
CO2: 24 mmol/L (ref 22–32)
Calcium: 9.2 mg/dL (ref 8.9–10.3)
Chloride: 104 mmol/L (ref 98–111)
Creatinine, Ser: 0.75 mg/dL (ref 0.61–1.24)
GFR, Estimated: >60 mL/min (ref >60)
Glucose, Bld: 117 mg/dL — ABNORMAL HIGH (ref 70–99)
Potassium: 3.4 mmol/L — ABNORMAL LOW (ref 3.5–5.1)
Sodium: 136 mmol/L (ref 135–145)

## 2022-04-18 LAB — BRAIN NATRIURETIC PEPTIDE: B Natriuretic Peptide: 121.2 pg/mL — ABNORMAL HIGH (ref 0.0–100.0)

## 2022-04-18 MED ORDER — ENTRESTO 24-26 MG PO TABS: 1.0000 | ORAL_TABLET | Freq: Two times a day (BID) | ORAL | 11 refills | Status: DC

## 2022-04-18 MED ORDER — POTASSIUM CHLORIDE CRYS ER 20 MEQ PO TBCR: 40.0000 meq | EXTENDED_RELEASE_TABLET | Freq: Every day | ORAL | 3 refills | Status: DC

## 2022-04-18 MED ORDER — IVABRADINE HCL 5 MG PO TABS: 5.0000 mg | ORAL_TABLET | Freq: Two times a day (BID) | ORAL | 11 refills | Status: DC

## 2022-04-18 NOTE — Telephone Encounter (Signed)
Pt aware, agreeable, and verbalized understanding, new rx sent in 

## 2022-04-18 NOTE — Progress Notes (Signed)
Advanced Heart Failure Clinic Note  PCP: [facility] HF physician: Dr. Shirlee Latch  HPI: Kyle Pearson is a 46 y.o. with history of tobacco and alcohol use, diagnosed with CHF in 10/21.    Prior to 11/21, no past medical history. He used to smoke less than 1/2 pack per day and drank about 6 pack of beer on the weekend.  He denies family history of heart disease but reported his father used to drink alcohol heavily but quit as well.  He states in 03/16/2020 he started to develop a cough and didn't want to mix medication with alcohol so he stopped drinking and started to take cough medication.  He noticed one day a very severe pain on his left side and pain with swallowing.  He was diagnosed with peritonsillar abscess and placed on clindamycin (300 mg po QID x 10 days) and decadron.  He reports feeling better throat wise but noticed increase swelling to his abdomen and difficulty breathing.  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  He continued to experience trouble breathing and walking short distances were difficulty.    He went to [facility] on 04/24/2020 for shortness of breath, abdominal distention and left sided chest pain. 2D echo showed EF 15-20% with severe LVH, BNP 562 and troponin 54.  He was started on lisinopril 5 mg, toprol-XL 25 mg PO daily and lasix IV. Blood pressure was low and required albumin 12.5 mg IV x 3 doses to help with BP.  He also had an abdominal ultrasound which showed no significant asites but note mild liver cirrhosis with mild gallbladder wall thickening. He was discharged home on 40 mg PO lasix, KCL 20 mEq, lisinopril 5 and toprol-XL 25.     He presented to 1st PCP appt on 05/29/20 and was found to have shortness of breath, bilaterally lower extremity edema and abdominal distention.  Reporting 30lb weight gain despite compliance with medication.  He was sent to [facility] , where his BNP was  [facility] BNP 562), CXR showed cardiomegaly only.  Repeat echo revealed EF < 20%, severe decrease function with global hypokinesis, grade III DD, dilated LA/RA and small pericardial effusion. He was admitted 05/29/20-06/06/20 with marked volume overload. Diuresed with IV lasix and set for cath. Cath showed elevated filling pressures and low ouput. Milrinone was added and he continued to diurese on IV. Milrinone later weaned off with stable mixed venous saturation. Started on GDMT with 4 corner stone HF meds. Had cMRI with severely reduced EF and non-coronary pattern extensive scar. Due to concern for ectopy from scar, he was discharged with Lifevest.  Diuresed 35 lbs. Discharge weight 255.6 lbs.  Echo 3/22 showed that EF remains < 20%, moderate LVE, moderate RVE, mildly decreased RV systolic function, IVC dilated.  CPX in 4/22 showed only a mild HF limitation.   Admitted 11/2020 with volume overload.   Today he returns for HF follow up.He has not been seen in over 1 year. Overall feeling fine. SOB carrying heavy items Denies PND/Orthopnea. Appetite ok. No fever or chills. He has not been weighing at home.  Drinking 6 pack per week.  Taking all medications except for corlanor and entresto. He has been out of  corlanor and entresto for a few months. Medical insurance has changed. Working full time Customer service manager.   Labs (2/22): digoxin 0.3, K 4.3, creatinine 0.85 Labs (5/22): digoxin <  0.2, K 3.7, creatinine 0.87, BNP 1246  PMH: 1. Prior smoker. 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  HIV negative.  No strong family history. Prior heavy ETOH but quit in 9/21.   - Echo (11/21): EF < 20%, severe LV dilation, severe RVE and severely decreased RV systolic function, severe biatrial enlargement.  - Cardiac MRI (11/21): Severely dilated LV with diffuse hypokinesis, EF 17%., mod dilated RV with EF 20%, diffuse mid-wall LGE in septum, inferior wall & inferolateral  wall - LHC/RHC (12/21): No significant CAD; mean RA 29, PA 69/32 mean 49, mean PCWP 43, CI 2.1 (Fick) and 1.7 (thermo), PVR 1.22 - Echo (3/22): EF < 20%, moderate LVE, moderate RVE, mildly decreased RV systolic function, IVC dilated.  - CPX (4/22): Peak VO2 20.2, VE/VCO2 slope 33, RER 1.07.  Mild HF limitation.  4. OSA   ROS: All systems negative except as listed in HPI, PMH and Problem List.  SH:  Social History   Socioeconomic History   Marital status: Married    Spouse name: Not on file   Number of children: Not on file   Years of education: Not on file   Highest education level: Not on file  Occupational History   Not on file  Tobacco Use   Smoking status: Former    Types: Cigarettes    Quit date: 2020    Years since quitting: 3.8   Smokeless tobacco: Never  Vaping Use   Vaping Use: Never used  Substance and Sexual Activity   Alcohol use: Not Currently   Drug use: Never   Sexual activity: Not on file  Other Topics Concern   Not on file  Social History Narrative   Lives with spouse   Social Determinants of Health   Financial Resource Strain: Not on file  Food Insecurity: Not on file  Transportation Needs: Not on file  Physical Activity: Not on file  Stress: Not on file  Social Connections: Not on file  Intimate Partner Violence: Not on file    FH:  Family History  Problem Relation Age of Onset   Diabetes Mother    Stroke Father    Diabetes Sister      Current Outpatient Medications  Medication Sig Dispense Refill   albuterol (VENTOLIN HFA) 108 (90 Base) MCG/ACT inhaler Inhale 2 puffs into the lungs every 6 (six) hours as needed (Cough).     bisacodyl (DULCOLAX) 5 MG EC tablet Take 10 mg by mouth daily as needed for mild constipation.     digoxin (LANOXIN) 0.125 MG tablet Take 1 tablet (125 mcg total) by mouth daily. 90 tablet 3   JARDIANCE 10 MG TABS tablet TAKE 1 TABLET BY MOUTH EVERY DAY BEFORE BREAKFAST 90 tablet 3   metoprolol succinate (TOPROL-XL)  100 MG 24 hr tablet Take 1 tablet (100 mg total) by mouth daily. 30 tablet 11   Multiple Vitamin (MULTI-VITAMIN) tablet Take 1 tablet by mouth daily.     potassium chloride SA (KLOR-CON M20) 20 MEQ tablet Take 2 tablets (40 mEq total) by mouth daily. NEEDS FOLLOW APPOINTMENT FOR ANYMORE REFILLS 180 tablet 0   spironolactone (ALDACTONE) 25 MG tablet Take 1 tablet (25 mg total) by mouth daily. 90 tablet 2   thiamine 100 MG tablet TAKE 1 TABLET BY MOUTH EVERY DAY 30 tablet 11   Torsemide 40 MG TABS Take 40 mg by mouth daily. 90 tablet 1   No current facility-administered medications for this encounter.   Vitals:  04/18/22 1519  BP: 114/76  Pulse: (!) 105  SpO2: 96%  Weight: (!) 137.2 kg (302 lb 6.4 oz)     Wt Readings from Last 3 Encounters:  04/18/22 (!) 137.2 kg (302 lb 6.4 oz)  01/26/21 118.8 kg (262 lb)  12/10/20 114.7 kg (252 lb 12.8 oz)    PHYSICAL EXAM: General:  Walked in the clinic. No resp difficulty HEENT: normal Neck: supple. no JVD. Carotids 2+ bilat; no bruits. No lymphadenopathy or thryomegaly appreciated. Cor: PMI nondisplaced. Regular rate & rhythm. No rubs, gallops or murmurs. Lungs: clear Abdomen: soft, nontender, distended. No hepatosplenomegaly. No bruits or masses. Good bowel sounds. Extremities: no cyanosis, clubbing, rash, edema Neuro: alert & orientedx3, cranial nerves grossly intact. moves all 4 extremities w/o difficulty. Affect pleasant  ASSESSMENT & PLAN: 1. Chronic systolic CHF: Nonischemic cardiomyopathy.  Symptomatic since 9/21 (after episode of peri-tonsillar abscess).  He was a prior drinker (none since 9/21) but does not seem to describe heavy enough ETOH to cause profound cardiomyopathy (though he did have mild cirrhosis on abdominal US at [facility]).  No FH of cardiomyopathy.  Born in [place], has been in [place] x many years.  HIV negative 9/21.  Echo 11/21 with EF < 20% with severe LV dilation, moderate RVE with severely decreased systolic function, severe  biatrial enlargement, mild MR. RHC/LHC 12/21 showed no coronary disease, markedly elevated filling pressures, and low cardiac output. Cardiac MRI in 11/21 showed severely dilated LV with diffuse hypokinesis, EF 17%, mod dilated RV with EF 20%, diffuse mid-wall LGE in septum, inferior wall & inferolateral wall.  LGE pattern suggests possible prior myocarditis. Echo in 3/22 showed that EF remains < 20% with mild RV dysfunction.  CPX in 4/22 showed only a mild HF limitation.   - NYHA II. Volume status stable. Continue torsemide 40 mg daily  - Restart entresto  24-26 mg twice a day   - Continue digoxin 0.125 daily.  - Continue Toprol XL to 100 mg daily.  - Continue spironolactone 25 mg daily.  - Continue Dapagliflozin 10 mg daily.   - Restarted ivabradine 5 mg bid.  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 ? Needs to obtain.   2. ETOH:  He did have early cirrhosis on abdominal US at [facility].  However, with obesity could be NASH. Started drinking again. Discussed cessation.    3. OSA: Needs CPAP  4. Obesity  Body mass index is 48.81 kg/m. Discussed portion control.   Check BMET/BNP today   Follow up in 2 weeks with pharmacy /check BMET .   Follow up 4 months with Dr Aundra Dubin and an ECHO.    Darrick Grinder, NP-C  04/18/22

## 2022-04-18 NOTE — Telephone Encounter (Signed)
-----   Message from Conrad [place], NP sent at 04/18/2022  4:57 PM EDT ----- Please call K low.

## 2022-04-18 NOTE — Patient Instructions (Addendum)
Labs done today. We will contact you only if your labs are abnormal.  START Entresto 24-26mg  (1 tablet) by mouth 2 times daily.   START Corlanor 5mg  (1 tablet) by mouth 2 times daily.   No other medication changes were made. Please continue all current medications as prescribed.  Your physician recommends that you schedule a follow-up appointment in: 2 weeks with Pharmacy clinic and in 3 months with Dr. Aundra Dubin with an echo prior to your exam. Please contact our office in November to schedule a January appointment.   Your physician has requested that you have an echocardiogram. Echocardiography is a painless test that uses sound waves to create images of your heart. It provides your doctor with information about the size and shape of your heart and how well your heart's chambers and valves are working. This procedure takes approximately one hour. There are no restrictions for this procedure. Please do NOT wear cologne, perfume, aftershave, or lotions (deodorant is allowed). Please arrive 15 minutes prior to your appointment time.  If you have any questions or concerns before your next appointment please send [place] a message through [place] or call our office at [phone].    TO LEAVE A MESSAGE FOR THE NURSE SELECT OPTION 2, PLEASE LEAVE A MESSAGE INCLUDING: YOUR NAME DATE OF BIRTH CALL BACK NUMBER REASON FOR CALL**this is important as we prioritize the call backs  YOU WILL RECEIVE A CALL BACK THE SAME DAY AS LONG AS YOU CALL BEFORE 4:00 PM   Do the following things EVERYDAY: Weigh yourself in the morning before breakfast. Write it down and keep it in a log. Take your medicines as prescribed Eat low salt foods--Limit salt (sodium) to 2000 mg per day.  Stay as active as you can everyday Limit all fluids for the day to less than 2 liters   At the [facility], you and your health needs are our priority. As part of our continuing mission to provide you with exceptional  heart care, we have created designated Provider Care Teams. These Care Teams include your primary Cardiologist (physician) and Advanced Practice Providers (APPs- Physician Assistants and Nurse Practitioners) who all work together to provide you with the care you need, when you need it.   You may see any of the following providers on your designated Care Team at your next follow up: Dr Glori Bickers Dr Haynes Kerns, NP Lyda Jester, [place] Audry Riles, PharmD   Please be sure to bring in all your medications bottles to every appointment.

## 2022-05-14 ENCOUNTER — Telehealth (HOSPITAL_COMMUNITY): Payer: Self-pay | Admitting: Pharmacist

## 2022-05-14 ENCOUNTER — Other Ambulatory Visit (HOSPITAL_COMMUNITY): Payer: Self-pay

## 2022-05-14 ENCOUNTER — Ambulatory Visit (HOSPITAL_COMMUNITY)
Admission: RE | Admit: 2022-05-14 | Discharge: 2022-05-14 | Disposition: A | Discharge status: Home / Self Care | Payer: 59 | Source: Ambulatory Visit | Attending: Cardiology | Admitting: Cardiology

## 2022-05-14 VITALS — BP 118/84 | HR 83 | Wt 302.8 lb

## 2022-05-14 DIAGNOSIS — E669 Obesity, unspecified: Secondary | ICD-10-CM | POA: Diagnosis not present

## 2022-05-14 DIAGNOSIS — Z79899 Other long term (current) drug therapy: Secondary | ICD-10-CM | POA: Diagnosis not present

## 2022-05-14 DIAGNOSIS — Z6841 Body Mass Index (BMI) 40.0 and over, adult: Secondary | ICD-10-CM | POA: Diagnosis not present

## 2022-05-14 DIAGNOSIS — Z7984 Long term (current) use of oral hypoglycemic drugs: Secondary | ICD-10-CM | POA: Diagnosis not present

## 2022-05-14 DIAGNOSIS — I428 Other cardiomyopathies: Secondary | ICD-10-CM | POA: Insufficient documentation

## 2022-05-14 DIAGNOSIS — G4733 Obstructive sleep apnea (adult) (pediatric): Secondary | ICD-10-CM | POA: Insufficient documentation

## 2022-05-14 DIAGNOSIS — Z87891 Personal history of nicotine dependence: Secondary | ICD-10-CM | POA: Insufficient documentation

## 2022-05-14 DIAGNOSIS — I5022 Chronic systolic (congestive) heart failure: Secondary | ICD-10-CM | POA: Insufficient documentation

## 2022-05-14 LAB — BASIC METABOLIC PANEL
Anion gap: 10 (ref 5–15)
BUN: 11 mg/dL (ref 6–20)
CO2: 30 mmol/L (ref 22–32)
Calcium: 8.8 mg/dL — ABNORMAL LOW (ref 8.9–10.3)
Chloride: 97 mmol/L — ABNORMAL LOW (ref 98–111)
Creatinine, Ser: 0.84 mg/dL (ref 0.61–1.24)
GFR, Estimated: >60 mL/min (ref >60)
Glucose, Bld: 109 mg/dL — ABNORMAL HIGH (ref 70–99)
Potassium: 3.9 mmol/L (ref 3.5–5.1)
Sodium: 137 mmol/L (ref 135–145)

## 2022-05-14 MED ORDER — TORSEMIDE 20 MG PO TABS: 40.0000 mg | ORAL_TABLET | Freq: Every day | ORAL | 1 refills | Status: DC

## 2022-05-14 MED ORDER — DAPAGLIFLOZIN PROPANEDIOL 10 MG PO TABS: 10.0000 mg | ORAL_TABLET | Freq: Every day | ORAL | 3 refills | Status: DC

## 2022-05-14 NOTE — Telephone Encounter (Signed)
Advanced Heart Failure Patient Advocate Encounter  Prior Authorization for Corlanor has been approved.    PA# [id number] Effective dates: 05/14/22 through 05/14/23  Karle Plumber, PharmD, BCPS, BCCP, CPP Heart Failure Clinic Pharmacist [phone]

## 2022-05-14 NOTE — Patient Instructions (Signed)
It was a pleasure seeing you today!  MEDICATIONS: -We are changing your medications today -Continue Entresto 24/26 mg twice daily, metoprolol succinate 100 mg daily, Spironolactone 25 mg daily, digoxin 0.125 mg daily, and torsemide 40 mg daily with potassium supplement  -Start Farxiga 10 mg daily. I have provided you a copay card today. It does NOT require activation.  -Please call and active the Entresto and Corlanor copay cards.  -Please pick up Corlanor from your pharmacy once you activate the card.  -Call if you have questions about your medications.  LABS: -We will call you if your labs need attention.  NEXT APPOINTMENT: Return to clinic in 4 weeks with pharmacy clinic.  In general, to take care of your heart failure: -Limit your fluid intake to 2 Liters (half-gallon) per day.   -Limit your salt intake to ideally 2-3 grams (2000-3000 mg) per day. -Weigh yourself daily and record, and bring that "weight diary" to your next appointment.  (Weight gain of 2-3 pounds in 1 day typically means fluid weight.) -The medications for your heart are to help your heart and help you live longer.   -Please contact us before stopping any of your heart medications.  Call the clinic at [phone] with questions or to reschedule future appointments.

## 2022-05-14 NOTE — Telephone Encounter (Signed)
Patient Advocate Encounter   Received notification from [employer] that prior authorization for Corlanor is required.   PA submitted on CoverMyMeds Key [id number] Status is pending   Will continue to follow.   Karle Plumber, PharmD, BCPS, BCCP, CPP Heart Failure Clinic Pharmacist [phone]

## 2022-05-14 NOTE — Progress Notes (Addendum)
Advanced Heart Failure Clinic Note   PCP: [facility] HF physician: Dr. Shirlee Latch  HPI:  Kyle Pearson is a 46 y.o. with history of tobacco and alcohol use, diagnosed with CHF in 03/2020.     Prior to 05/2020, no past medical history. He used to smoke less than 1/2 pack per day and drank about 6 pack of beer on the weekend. He denies family history of heart disease but reported his father used to drink alcohol heavily but quit as well. He states in 03/16/2020 he started to develop a cough and didn't want to mix medication with alcohol so he stopped drinking and started to take cough medication. He noticed one day a very severe pain on his left side and pain with swallowing. He was diagnosed with peritonsillar abscess and placed on clindamycin (300 mg po QID x 10 days) and decadron. He reported feeling better throat wise but noticed increase swelling to his abdomen and difficulty breathing. He continued to experience trouble breathing and walking short distances was difficult.     He went to [facility] on 04/24/2020 for shortness of breath, abdominal distention and left sided chest pain. 2D echo showed EF 15-20% with severe LVH, BNP 562 and troponin 54. He was started on lisinopril 5 mg, metoprolol-XL 25 mg PO daily and Lasix IV. Blood pressure was low and required albumin 12.5 mg IV x 3 doses to help with BP. He also had an abdominal ultrasound which showed no significant asites but noted mild liver cirrhosis with mild gallbladder wall thickening. He was discharged home on 40 mg PO Lasix, KCL 20 mEq, lisinopril 5 and metoprolol-XL 25.     He presented to 1st PCP appt on 05/29/2020 and was found to have shortness of breath, bilateral lower extremity edema and abdominal distention. Reported 30 lb weight gain despite compliance with medication. He was sent to [facility], where his BNP was [facility] BNP 562), CXR showed cardiomegaly only. Repeat echo revealed EF < 20%, severely decrease  function with global hypokinesis, grade III DD, dilated LA/RA and small pericardial effusion. He was admitted 05/29/2020-06/06/2020 with marked volume overload. Diuresed with IV Lasix and set for cath. Cath showed elevated filling pressures and low ouput. Milrinone was added and he continued to diurese on IV. Milrinone later weaned off with stable mixed venous saturation. Started on GDMT with 4 corner stone HF meds. Had cMRI with severely reduced EF and non-coronary pattern extensive scar. Due to concern for ectopy from scar, he was discharged with Lifevest. Diuresed 35 lbs. Discharge weight 255.6 lbs.   Echo 08/2020 showed that EF remained < 20%, moderate LVE, moderate RVE, mildly decreased RV systolic function, IVC dilated. CPX in 09/2020 showed only a mild HF limitation.    Admitted 11/2020 with volume overload.    Returned for HF follow up with APP clinic on 04/18/2022. He had not been seen in over 1 year. Overall was feeling fine. SOB carrying heavy items. Denied PND/Orthopnea. Appetite was ok. No fever or chills. He had not been weighing at home. Drinking 6 pack per week. He was taking all medications except for Corlanor and Entresto (out for a few months). Medical insurance had changed. He was working full time installing windows.   Today he returns to HF clinic for pharmacist medication titration. At last visit with APP, Entresto 24/26 mg BID and Corlanor 5 mg BID were restarted. However, he did not start Corlanor as it required a prior authorization which  was approved today. He does not have his medicine bottles with him today and he has a lot of confusion regarding his medications. He is unable to recall the names of his medications, says its ~6 medications/day. He is unable to tell me if he is on Entresto 24/26 mg BID or 97/103 mg BID (both were picked up last month, per [facility], on 04/18/22 and 04/17/22 respectively). Overall feeling the same since last visit. He does not regularly check his BP at home.  Denies dizziness, lightheadedness, fatigue, chest pain or palpitations. Gets breathless carrying heavy objects, but does fine lifting 30-40 lbs at work. He is able to walk on flat ground and go up stairs without getting winded. Weight at home stable at 290 pounds. Takes torsemide 40 mg daily and has not needed any additional doses. No LEE. Denies PND/Orthopnea  HF Medications: Metoprolol succinate 100 mg daily Entresto 24/26 mg BID Spironolactone 25 mg daily Digoxin 0.125 mg daily Torsemide 40 mg daily + KCl 40 mEq daily  Has the patient been experiencing any side effects to the medications prescribed? No  Does the patient have any problems obtaining medications due to transportation or finances? [employer]; copay card provided for [place] today. Instructed patient to call and active Entresto and Corlanor copay cards.   Understanding of regimen: fair Understanding of indications: fair Potential of compliance: poor Patient understands to avoid NSAIDs. Patient understands to avoid decongestants.    Pertinent Lab Values: (04/18/2022) Serum creatinine 0.75, BUN 10, Potassium 3.4, Sodium 136, Magnesium 1.9, Digoxin 0.3   Vital Signs: Weight: 302.8 lbs (last clinic weight: 302.4 lbs) Blood pressure: 118/84 mmHg  Heart rate: 83 bpm   Assessment/Plan: 1. Chronic systolic CHF: Nonischemic cardiomyopathy.  Symptomatic since 03/2020 (after episode of peri-tonsillar abscess). He was a prior drinker (none since 03/2020) but does not seem to describe heavy enough ETOH to cause profound cardiomyopathy (though he did have mild cirrhosis on abdominal US at [facility]). No FH of cardiomyopathy. Born in [place], has been in [place] x many years. HIV negative 03/2020. Echo 05/2020 with EF < 20% with severe LV dilation, moderate RVE with severely decreased systolic function, severe biatrial enlargement, mild MR. RHC/LHC 05/2020 showed no coronary disease, markedly elevated filling pressures, and low cardiac output. Cardiac MRI  in 05/2020 showed severely dilated LV with diffuse hypokinesis, EF 17%, mod dilated RV with EF 20%, diffuse mid-wall LGE in septum, inferior wall & inferolateral wall. LGE pattern suggests possible prior myocarditis. Echo in 08/2020 showed that EF remains < 20% with mild RV dysfunction. CPX in 09/2020 showed only a mild HF limitation.   - NYHA II. Euvolemic on exam today.  - Continue torsemide 40 mg daily + KCl 40 mEq daily. BMET today pending.  - Continue metoprolol succinate 100 mg daily.   - Continue Entresto 24/26 mg BID for now. Will attempt to titrate at next visit. Instructed patient to bring all medication bottles with him to his follow-up.  - Continue spironolactone 25 mg daily.  - Start Farxiga 10 mg daily. Changed from Jardiance due to new insurance coverage.  - Continue digoxin 0.125 mg daily.  - Start Corlanor 5 mg BID (never started as instructed after last visit). PA now approved.  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 ? Needs to obtain.    2. ETOH:  He did have early cirrhosis on abdominal US at [facility].  However, with obesity could be NASH. Started  drinking again. Discussed cessation.    3. OSA: Needs CPAP   4. Obesity  Body mass index is 48.81 kg/m.  Follow up 06/11/2022 with pharmacy clinic.   Karle Plumber, PharmD, BCPS, BCCP, CPP Heart Failure Clinic Pharmacist [phone]

## 2022-06-04 NOTE — Progress Notes (Incomplete)
***In Progress***    Advanced Heart Failure Clinic Note   PCP: [facility] HF physician: Dr. Aundra Dubin   HPI:  Kyle Pearson is a 46 y.o. with history of tobacco and alcohol use, diagnosed with CHF in 03/2020.     Prior to 05/2020, no past medical history. He used to smoke less than 1/2 pack per day and drank about 6 pack of beer on the weekend. He denies family history of heart disease but reported his father used to drink alcohol heavily but quit as well. He states in 03/16/2020 he started to develop a cough and didn't want to mix medication with alcohol so he stopped drinking and started to take cough medication. He noticed one day a very severe pain on his left side and pain with swallowing. He was diagnosed with peritonsillar abscess and placed on clindamycin (300 mg po QID x 10 days) and decadron. He reported feeling better throat wise but noticed increase swelling to his abdomen and difficulty breathing. He continued to experience trouble breathing and walking short distances was difficult.     He went to [facility] on 04/24/2020 for shortness of breath, abdominal distention and left sided chest pain. 2D echo showed EF 15-20% with severe LVH, BNP 562 and troponin 54. He was started on lisinopril 5 mg, metoprolol-XL 25 mg PO daily and Lasix IV. Blood pressure was low and required albumin 12.5 mg IV x 3 doses to help with BP. He also had an abdominal ultrasound which showed no significant asites but noted mild liver cirrhosis with mild gallbladder wall thickening. He was discharged home on 40 mg PO Lasix, KCL 20 mEq, lisinopril 5 and metoprolol-XL 25.     He presented to 1st PCP appt on 05/29/2020 and was found to have shortness of breath, bilateral lower extremity edema and abdominal distention. Reported 30 lb weight gain despite compliance with medication. He was sent to [facility] where his BNP was [address] [facility] BNP 562), CXR showed cardiomegaly only. Repeat echo revealed EF < 20%,  severely decrease function with global hypokinesis, grade III DD, dilated LA/RA and small pericardial effusion. He was admitted 05/29/2020-06/06/2020 with marked volume overload. Diuresed with IV Lasix and set for cath. Cath showed elevated filling pressures and low ouput. Milrinone was added and he continued to diurese on IV. Milrinone later weaned off with stable mixed venous saturation. Started on GDMT with 4 corner stone HF meds. Had cMRI with severely reduced EF and non-coronary pattern extensive scar. Due to concern for ectopy from scar, he was discharged with Lifevest. Diuresed 35 lbs. Discharge weight 255.6 lbs.   Echo 08/2020 showed that EF remained < 20%, moderate LVE, moderate RVE, mildly decreased RV systolic function, IVC dilated. CPX in 09/2020 showed only a mild HF limitation.    Admitted 11/2020 with volume overload.    Returned for HF follow up with APP clinic on 04/18/2022. He had not been seen in over 1 year. Overall was feeling fine. SOB carrying heavy items. Denied PND/Orthopnea. Appetite was ok. No fever or chills. He had not been weighing at home. Drinking 6 pack per week. He was taking all medications except for Corlanor and Entresto (out for a few months). Medical insurance had changed. He was working full time installing windows. Entresto 24/26 mg BID and Corlanor 5 mg BID were restarted.    Returned to HF clinic for pharmacist medication titration on 05/14/2022. He stated he had not start Corlanor yet (required a PA). He was  confused regarding his medications. He was unable to say if he was on Entresto 24/26 mg BID or 97/103 mg BID (both were picked [facility], on 04/18/22 and 04/17/22 respectively). Denied dizziness, lightheadedness, fatigue, chest pain or palpitations. He was breathless carrying heavy objects, but did fine lifting 30-40 lbs at work. He was able to walk on flat ground and up stairs without getting winded. Weight at home was stable at 290 pounds. He was taking  torsemide 40 mg daily and did not need any additional doses. No LEE. Denied PND/Orthopnea  Today he returns to HF clinic for pharmacist medication titration. At last visit with pharmacy, Wilder Glade was switched to Jardiance due to insurance coverage, Entresto 24/26 mg BID was continued with plans to titrate at next visit, and patient was instructed to pick up Corlanor now that a PA was approved.   Overall feeling ***. Dizziness, lightheadedness, fatigue:  Chest pain or palpitations:  How is your breathing?: *** SOB: Able to complete all ADLs. Activity level ***  Weight at home pounds. Takes furosemide/torsemide/bumex *** mg *** daily.  LEE PND/Orthopnea  Appetite *** Low-salt diet:   Physical Exam Cost/affordability of meds   HF Medications: Metoprolol succinate 100 mg daily Entresto 24/26 mg BID Spironolactone 25 mg daily Jardiance 10 mg daily Digoxin 0.125 mg daily Torsemide 40 mg daily + KCl 40 mEq daily  Understanding of regimen: {excellent/good/fair/poor:19665} Understanding of indications: {excellent/good/fair/poor:19665} Potential of compliance: {excellent/good/fair/poor:19665} Patient understands to avoid NSAIDs. Patient understands to avoid decongestants.    Pertinent Lab Values: Serum creatinine ***, BUN ***, Potassium ***, Sodium ***, BNP ***, Magnesium ***, Digoxin ***   Vital Signs: Weight: *** (last clinic weight: ***) Blood pressure: ***  Heart rate: ***   Assessment/Plan: 1. Chronic systolic CHF: Nonischemic cardiomyopathy.  Symptomatic since 03/2020 (after episode of peri-tonsillar abscess). He was a prior drinker (none since 03/2020) but does not seem to describe heavy enough ETOH to cause profound cardiomyopathy (though he did have mild cirrhosis on abdominal US at [facility]). No FH of cardiomyopathy. Born in [place], has been in [place] x many years. HIV negative 03/2020. Echo 05/2020 with EF < 20% with severe LV dilation, moderate RVE with severely decreased systolic  function, severe biatrial enlargement, mild MR. RHC/LHC 05/2020 showed no coronary disease, markedly elevated filling pressures, and low cardiac output. Cardiac MRI in 05/2020 showed severely dilated LV with diffuse hypokinesis, EF 17%, mod dilated RV with EF 20%, diffuse mid-wall LGE in septum, inferior wall & inferolateral wall. LGE pattern suggests possible prior myocarditis. Echo in 08/2020 showed that EF remains < 20% with mild RV dysfunction. CPX in 09/2020 showed only a mild HF limitation.   - NYHA II. Euvolemic on exam today.  - Continue torsemide 40 mg daily + KCl 40 mEq daily. BMET stable 05/14/2022. - Continue metoprolol succinate 100 mg daily.   - Continue Entresto 24/26 mg BID for now. Will attempt to titrate at next visit. Instructed patient to bring all medication bottles with him to his follow-up.  - Continue spironolactone 25 mg daily.  - Continue Farxiga 10 mg daily (changed from [place] due to insurance coverage).  - Continue digoxin 0.125 mg daily.  - Start Corlanor 5 mg BID (never started as instructed after last visit). PA now approved.  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 ? Needs to obtain.    2. ETOH:  He did have early cirrhosis on abdominal US  at [facility].  However, with obesity could be NASH. Started drinking again. Discussed cessation.    3. OSA: Needs CPAP   4. Obesity  Body mass index is 48.81 kg/m.  Follow up *** OV + echo with Dr. Shirlee Latch in February 2024   Karle Plumber, PharmD, BCPS, BCCP, CPP Heart Failure Clinic Pharmacist [phone]

## 2022-06-11 ENCOUNTER — Encounter (HOSPITAL_COMMUNITY): Payer: Self-pay

## 2022-06-11 ENCOUNTER — Ambulatory Visit (HOSPITAL_COMMUNITY)
Admission: RE | Admit: 2022-06-11 | Discharge: 2022-06-11 | Disposition: A | Discharge status: Home / Self Care | Payer: 59 | Source: Ambulatory Visit | Attending: Internal Medicine | Admitting: Internal Medicine

## 2022-06-11 VITALS — BP 122/72 | HR 84 | Wt 308.2 lb

## 2022-06-11 DIAGNOSIS — Z87891 Personal history of nicotine dependence: Secondary | ICD-10-CM | POA: Insufficient documentation

## 2022-06-11 DIAGNOSIS — Z7984 Long term (current) use of oral hypoglycemic drugs: Secondary | ICD-10-CM | POA: Diagnosis not present

## 2022-06-11 DIAGNOSIS — Z79899 Other long term (current) drug therapy: Secondary | ICD-10-CM | POA: Insufficient documentation

## 2022-06-11 DIAGNOSIS — Z6841 Body Mass Index (BMI) 40.0 and over, adult: Secondary | ICD-10-CM | POA: Insufficient documentation

## 2022-06-11 DIAGNOSIS — G4733 Obstructive sleep apnea (adult) (pediatric): Secondary | ICD-10-CM | POA: Insufficient documentation

## 2022-06-11 DIAGNOSIS — I428 Other cardiomyopathies: Secondary | ICD-10-CM | POA: Diagnosis not present

## 2022-06-11 DIAGNOSIS — I5022 Chronic systolic (congestive) heart failure: Secondary | ICD-10-CM | POA: Diagnosis present

## 2022-06-11 DIAGNOSIS — K7460 Unspecified cirrhosis of liver: Secondary | ICD-10-CM | POA: Diagnosis not present

## 2022-06-11 DIAGNOSIS — E669 Obesity, unspecified: Secondary | ICD-10-CM | POA: Insufficient documentation

## 2022-06-11 NOTE — Patient Instructions (Addendum)
It was a pleasure seeing you today!  MEDICATIONS: -We are changing your medications today -Start Farxiga 10 mg daily. Please pick this up from the pharmacy as soon as possible.  -Call if you have questions about your medications.   NEXT APPOINTMENT: Return to clinic in February with Dr. Shirlee Latch.  In general, to take care of your heart failure: -Limit your fluid intake to 2 Liters (half-gallon) per day.   -Limit your salt intake to ideally 2-3 grams (2000-3000 mg) per day. -Weigh yourself daily and record, and bring that "weight diary" to your next appointment.  (Weight gain of 2-3 pounds in 1 day typically means fluid weight.) -The medications for your heart are to help your heart and help you live longer.   -Please contact us before stopping any of your heart medications.  Call the clinic at [phone] with questions or to reschedule future appointments.

## 2022-06-11 NOTE — Progress Notes (Signed)
Advanced Heart Failure Clinic Note   PCP: [facility] HF physician: Dr. Shirlee Latch   HPI:  Kyle Pearson is a 46 y.o. with history of tobacco and alcohol use, diagnosed with CHF in 03/2020.     Prior to 05/2020, no past medical history. He used to smoke less than 1/2 pack per day and drank about 6 pack of beer on the weekend. He denies family history of heart disease but reported his father used to drink alcohol heavily but quit as well. He states in 03/16/2020 he started to develop a cough and didn't want to mix medication with alcohol so he stopped drinking and started to take cough medication. He noticed one day a very severe pain on his left side and pain with swallowing. He was diagnosed with peritonsillar abscess and placed on clindamycin (300 mg po QID x 10 days) and decadron. He reported feeling better throat wise but noticed increase swelling to his abdomen and difficulty breathing. He continued to experience trouble breathing and walking short distances was difficult.     He went to [facility] on 04/24/2020 for shortness of breath, abdominal distention and left sided chest pain. 2D echo showed EF 15-20% with severe LVH, BNP 562 and troponin 54. He was started on lisinopril 5 mg, metoprolol-XL 25 mg PO daily and Lasix IV. Blood pressure was low and required albumin 12.5 mg IV x 3 doses to help with BP. He also had an abdominal ultrasound which showed no significant asites but noted mild liver cirrhosis with mild gallbladder wall thickening. He was discharged home on 40 mg PO Lasix, KCL 20 mEq, lisinopril 5 and metoprolol-XL 25.     He presented to 1st PCP appt on 05/29/2020 and was found to have shortness of breath, bilateral lower extremity edema and abdominal distention. Reported 30 lb weight gain despite compliance with medication. He was sent to [facility], where his BNP was [address] [facility] BNP 562), CXR showed cardiomegaly only. Repeat echo revealed EF < 20%, severely  decrease function with global hypokinesis, grade III DD, dilated LA/RA and small pericardial effusion. He was admitted 05/29/2020-06/06/2020 with marked volume overload. Diuresed with IV Lasix and set for cath. Cath showed elevated filling pressures and low ouput. Milrinone was added and he continued to diurese on IV. Milrinone later weaned off with stable mixed venous saturation. Started on GDMT with 4 corner stone HF meds. Had cMRI with severely reduced EF and non-coronary pattern extensive scar. Due to concern for ectopy from scar, he was discharged with Lifevest. Diuresed 35 lbs. Discharge weight 255.6 lbs.   Echo 08/2020 showed that EF remained < 20%, moderate LVE, moderate RVE, mildly decreased RV systolic function, IVC dilated. CPX in 09/2020 showed only a mild HF limitation.    Admitted 11/2020 with volume overload.     Returned to HF clinic for pharmacist medication titration on 05/14/2022. He stated he had not start Corlanor yet (required a PA). He was confused regarding his medications. He was unable to say if he was on Entresto 24/26 mg BID or 97/103 mg BID (both were [facility], on 04/18/22 and 04/17/22 respectively). Denied dizziness, lightheadedness, fatigue, chest pain or palpitations. He was breathless carrying heavy objects, but did fine lifting 30-40 lbs at work. He was able to walk on flat ground and up stairs without getting winded. Weight at home was stable at 290 pounds. He was taking torsemide 40 mg daily and did not need any additional  doses. No LEE. Denied PND/Orthopnea  Today he returns to HF clinic for pharmacist medication titration. At last visit with pharmacy, Marcelline Deist was switched to Jardiance due to insurance coverage and patient was instructed to pick up Corlanor and [place] using the copay cards we provided at that visit. Today, he states he was not able to pick up Corlanor or Marcelline Deist due to issues with the copay cards. Called [facility] during our visit, provided copay card  information, and confirmed Marcelline Deist is a $0 copay. Also confirmed with [facility] he is able to refill his Entresto 24/26 mg BID. Overall feeling good. Denies dizziness, lightheadedness, fatigue, chest pain or palpitations. Breathing unchanged from last visit. Weight at home stable at 299-305 pounds. Takes torsemide 40 mg daily and he has not needed any additional doses. No LEE. Denies PND/Orthopnea. He does notice that when his wife cooks fried food, he has more fluid accumulation. He has been using lime juice to flavor his food instead of salt. BP in clinic stable at 122/72 mmHg.   HF Medications: Metoprolol succinate 100 mg daily Entresto 24/26 mg BID Spironolactone 25 mg daily Digoxin 0.125 mg daily Torsemide 40 mg daily + KCl 40 mEq daily  Understanding of regimen: fair Understanding of indications: fair Potential of compliance: fair Patient understands to avoid NSAIDs. Patient understands to avoid decongestants.  Pertinent Lab Values: (05/14/2022) Serum creatinine 0.84, BUN 11, Potassium 3.9, Sodium 137, (04/18/2022) BNP 121.2  Vital Signs: Weight: 308.2 (last clinic weight: 302.8 lbs) Blood pressure: 122/72 mmHg  Heart rate: 84 bpm   Assessment/Plan: 1. Chronic systolic CHF: Nonischemic cardiomyopathy.  Symptomatic since 03/2020 (after episode of peri-tonsillar abscess). He was a prior drinker (none since 03/2020) but does not seem to describe heavy enough ETOH to cause profound cardiomyopathy (though he did have mild cirrhosis on abdominal US at [facility]). No FH of cardiomyopathy. Born in [place], has been in [place] x many years. HIV negative 03/2020. Echo 05/2020 with EF < 20% with severe LV dilation, moderate RVE with severely decreased systolic function, severe biatrial enlargement, mild MR. RHC/LHC 05/2020 showed no coronary disease, markedly elevated filling pressures, and low cardiac output. Cardiac MRI in 05/2020 showed severely dilated LV with diffuse hypokinesis, EF 17%, mod dilated RV with EF  20%, diffuse mid-wall LGE in septum, inferior wall & inferolateral wall. LGE pattern suggests possible prior myocarditis. Echo in 08/2020 showed that EF remains < 20% with mild RV dysfunction. CPX in 09/2020 showed only a mild HF limitation.   - NYHA II. Euvolemic on exam today.  - Continue torsemide 40 mg daily + KCl 40 mEq daily. BMET stable 05/14/2022. - Continue metoprolol succinate 100 mg daily.   - Continue Entresto 24/26 mg BID. BP at goal in clinic today.  - Continue spironolactone 25 mg daily.  - Start Farxiga 10 mg daily. Confirmed [facility] copay with [facility]. Re-emphasized the importance of picking up and starting this medication.  - Continue digoxin 0.125 mg daily.  - Stop Corlanor 5 mg BID given patient has not started since being prescribed on 04/18/2022 and HR has been 83-84 bpm at his last two visits.  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 ? Needs to obtain.    2. ETOH:  He did have early cirrhosis on abdominal US at [facility].  However, with obesity could be NASH. Started drinking again.    3. OSA: Needs CPAP   4. Obesity  Body mass index is  49.74 kg/m.  Follow up 08/26/2021 with Dr. Shirlee Latch for echo + OV  Karle Plumber, PharmD, BCPS, [facility], CPP Heart Failure Clinic Pharmacist [phone]

## 2022-07-29 IMAGING — CR DG CHEST 2V
2 series · 2 of 2 positions shown · non-contrast
Comparison: None.

CLINICAL DATA: Chest pain.

EXAM:
CHEST - 2 VIEW

[chest pa]
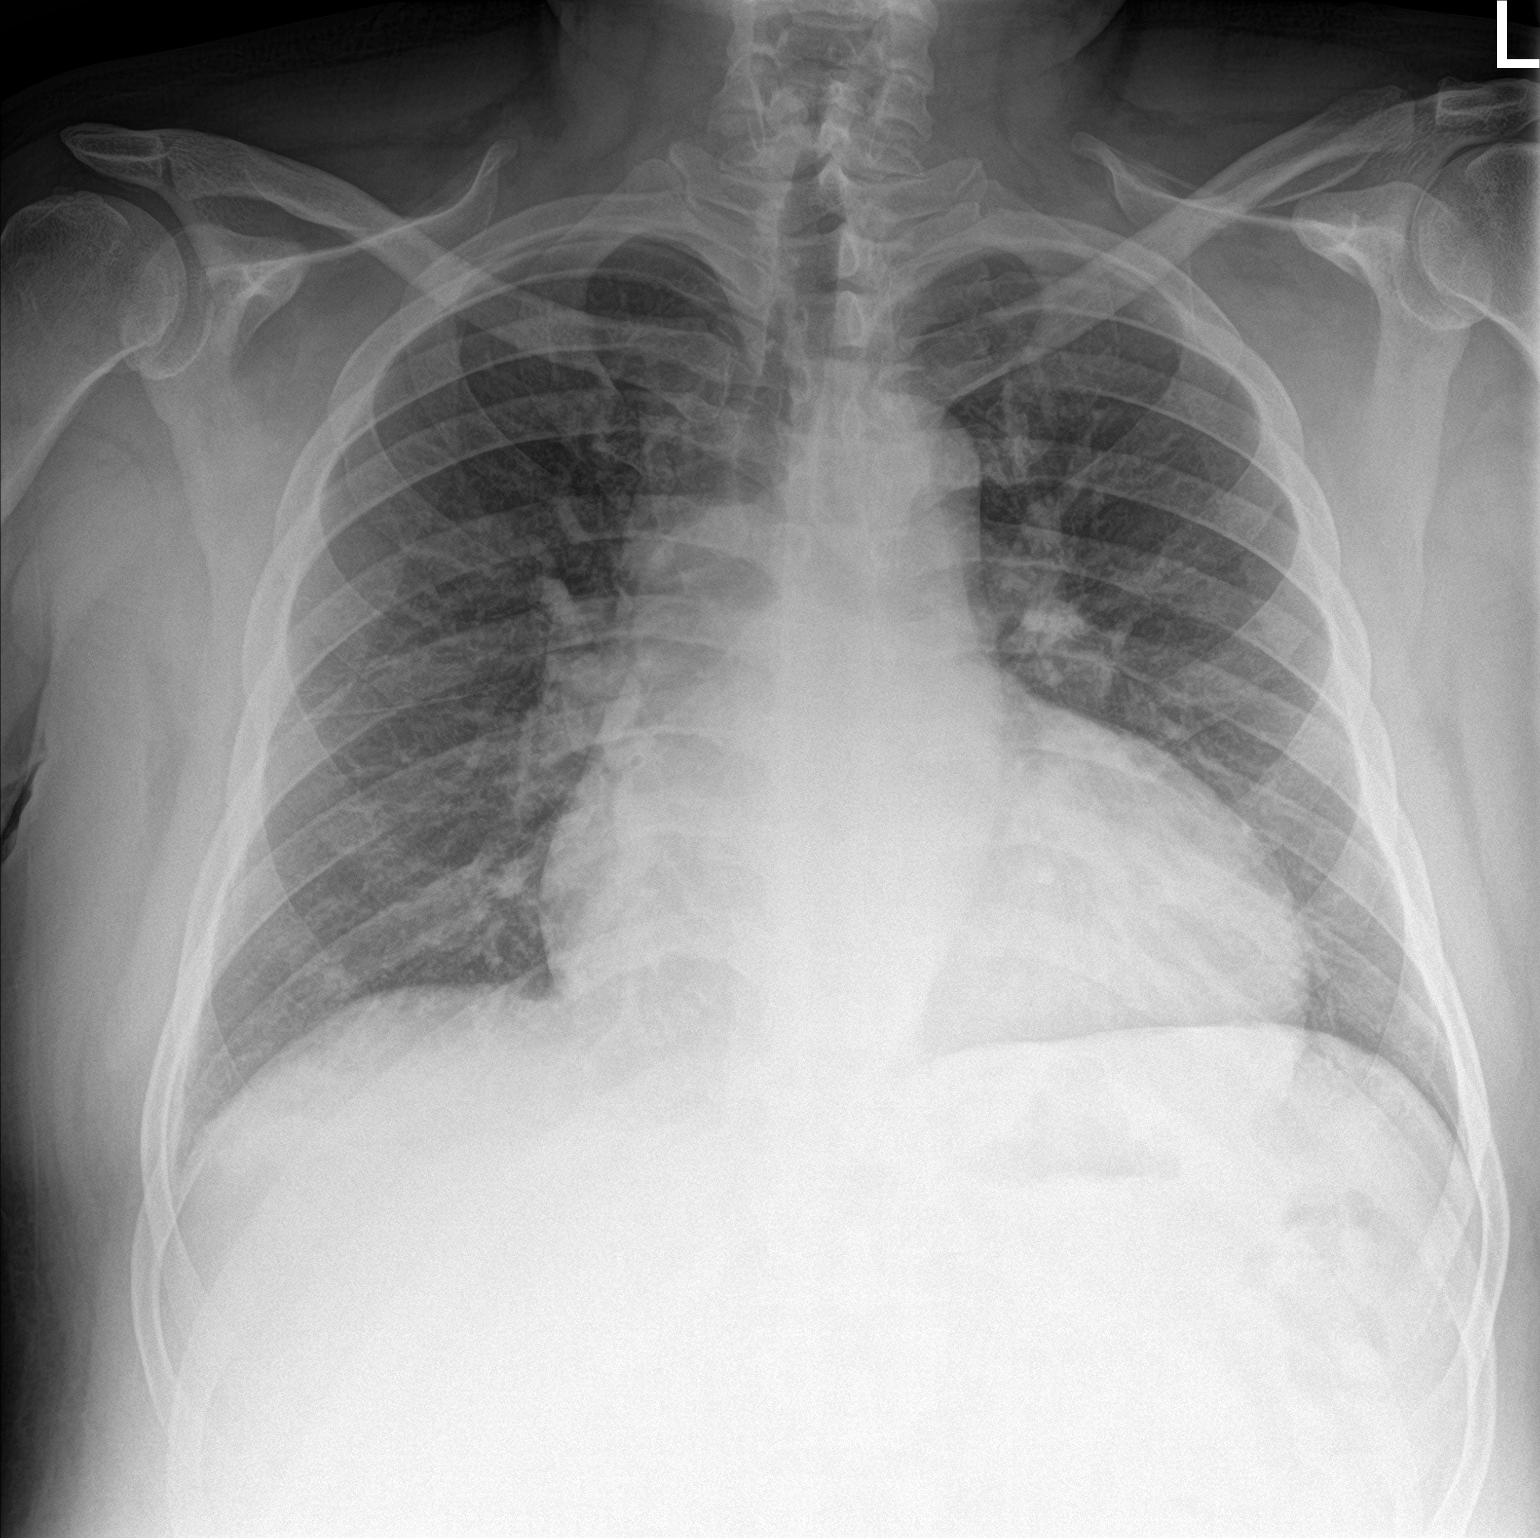

[chest lat]
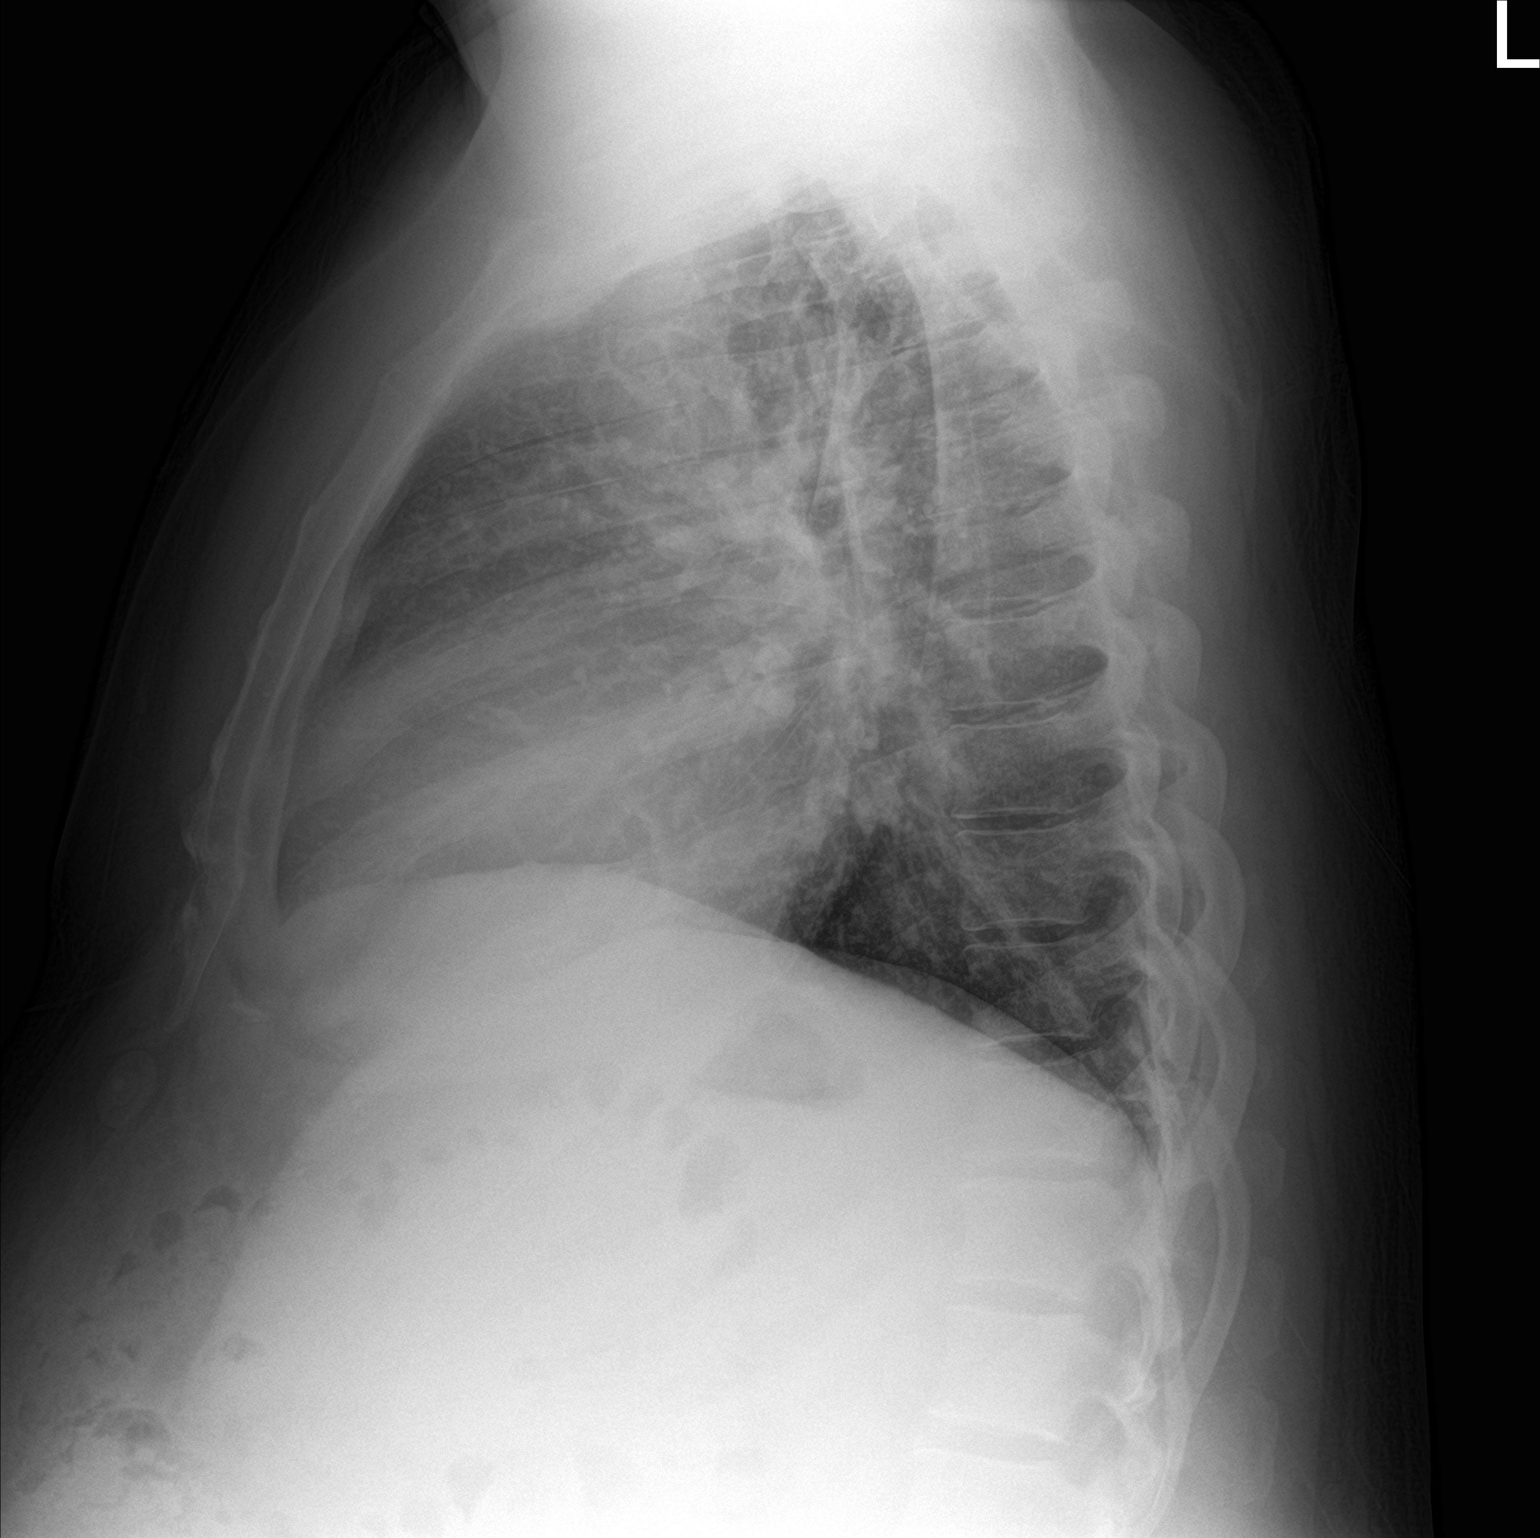

[2 of 2 positions shown; findings below may reference images not displayed]

FINDINGS: Two views of the chest demonstrate cardiac enlargement. No focal
airspace disease or overt pulmonary edema. No large pleural
effusions. Trachea is midline. Negative for a pneumothorax. Bone
structures are unremarkable.
IMPRESSION: Cardiomegaly without acute lung findings.

## 2022-08-23 ENCOUNTER — Other Ambulatory Visit (HOSPITAL_COMMUNITY): Payer: Self-pay | Admitting: Cardiology

## 2022-08-26 ENCOUNTER — Encounter (HOSPITAL_COMMUNITY): Payer: 59 | Admitting: Cardiology

## 2022-08-26 ENCOUNTER — Ambulatory Visit (HOSPITAL_COMMUNITY): Payer: 59

## 2022-09-24 ENCOUNTER — Encounter: Payer: Self-pay | Admitting: Cardiology

## 2022-09-24 ENCOUNTER — Telehealth: Payer: Self-pay

## 2022-09-24 ENCOUNTER — Ambulatory Visit: Payer: BLUE CROSS/BLUE SHIELD | Attending: Cardiology | Admitting: Cardiology

## 2022-09-24 VITALS — BP 100/58 | HR 108 | Ht 66.0 in | Wt 316.0 lb

## 2022-09-24 DIAGNOSIS — I5022 Chronic systolic (congestive) heart failure: Secondary | ICD-10-CM | POA: Diagnosis not present

## 2022-09-24 DIAGNOSIS — I472 Ventricular tachycardia, unspecified: Secondary | ICD-10-CM | POA: Diagnosis not present

## 2022-09-24 DIAGNOSIS — G4733 Obstructive sleep apnea (adult) (pediatric): Secondary | ICD-10-CM

## 2022-09-24 NOTE — Patient Instructions (Signed)
Medication Instructions:  Your physician recommends that you continue on your current medications as directed. Please refer to the Current Medication list given to you today. *If you need a refill on your cardiac medications before your next appointment, please call your pharmacy*   Lab Work: BMET, BNP, and Mg TOMORROW If you have labs (blood work) drawn today and your tests are completely normal, you will receive your results only by: [place] (if you have MyChart) OR A paper copy in the mail If you have any lab test that is abnormal or we need to change your treatment, we will call you to review the results.  Follow-Up: At [facility], you and your health needs are our priority.  As part of our continuing mission to provide you with exceptional heart care, we have created designated Provider Care Teams.  These Care Teams include your primary Cardiologist (physician) and Advanced Practice Providers (APPs -  Physician Assistants and Nurse Practitioners) who all work together to provide you with the care you need, when you need it.  We recommend signing up for the patient portal called "MyChart".  Sign up information is provided on this After Visit Summary.  MyChart is used to connect with patients for Virtual Visits (Telemedicine).  Patients are able to view lab/test results, encounter notes, upcoming appointments, etc.  Non-urgent messages can be sent to your provider as well.   To learn more about what you can do with MyChart, go to [email or web address].    Your next appointment:   1 month(s)  Provider:   You will see one of the following Advanced Practice Providers on your designated Care Team:   Tommye Standard, [place] [facility] [place], [place]

## 2022-09-24 NOTE — Progress Notes (Signed)
Electrophysiology Office Note   Date:  09/24/2022   ID:  Kyle Pearson, Kyle Pearson, Kyle Pearson, Kyle Pearson  PCP:  Patient, No Pcp Per  Cardiologist:  Aundra Dubin Primary Electrophysiologist:  Katharina Jehle Meredith Leeds, MD    Chief Complaint: CHF   History of Present Illness: Kyle Pearson is a 47 y.o. male who is being seen today for the evaluation of CHF at the request of No ref. provider found. Presenting today for electrophysiology evaluation.  He has a history significant for tobacco abuse, alcohol abuse, CHF.  He presented to [facility] 04/24/2020 with shortness of breath and abdominal distention as well as left-sided chest pain.  Echo showed an ejection fraction of 15 to 20% with severe LVH.  He then presented to his PCP and was found to have shortness of breath and bilateral lower extremity edema.  He had a 30 pound weight gain.  He was sent to the hospital with a BNP of 1000.  Echo showed an ejection fraction again of less than 20%.  He had a cardiac MRI that showed a severely reduced ejection fraction with noncoronary scar pattern.  He is now status post Reeds implanted 10/23/2020.  Today, denies symptoms of palpitations, chest pain, shortness of breath, orthopnea, PND, lower extremity edema, claudication, dizziness, presyncope, syncope, bleeding, or neurologic sequela. The patient is tolerating medications without difficulties.  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  A few seconds later, he received an ICD shock.  He has not had any shortness of breath, chest pain.  He was completely asymptomatic prior to the shock and has felt well other than some chest soreness since the shock.   Past Medical History:  Diagnosis Date   CHF (congestive heart failure) [facility])    Past Surgical History:  Procedure Laterality Date   RIGHT HEART CATH N/A 12/08/2020   Procedure: RIGHT HEART CATH;  Surgeon: Larey Dresser,  MD;  Location: [place] [facility];  Service: Cardiovascular;  Laterality: N/A;   RIGHT/LEFT HEART CATH AND CORONARY ANGIOGRAPHY N/A 06/01/2020   Procedure: RIGHT/LEFT HEART CATH AND CORONARY ANGIOGRAPHY;  Surgeon: Larey Dresser, MD;  Location: [place] [facility];  Service: Cardiovascular;  Laterality: N/A;   SUBQ ICD IMPLANT N/A 01/26/2021   Procedure: SUBQ ICD IMPLANT;  Surgeon: Constance Haw, MD;  Location: [place] [facility];  Service: Cardiovascular;  Laterality: N/A;     Current Outpatient Medications  Medication Sig Dispense Refill   albuterol (VENTOLIN HFA) 108 (90 Base) MCG/ACT inhaler Inhale 2 puffs into the lungs every 6 (six) hours as needed (Cough).     bisacodyl (DULCOLAX) 5 MG EC tablet Take 10 mg by mouth daily as needed for mild constipation.     dapagliflozin propanediol (FARXIGA) 10 MG TABS tablet Take 1 tablet (10 mg total) by mouth daily before breakfast. 90 tablet 3   digoxin (LANOXIN) 0.125 MG tablet TAKE 1 TABLET BY MOUTH EVERY DAY 90 tablet 1   metoprolol succinate (TOPROL-XL) 100 MG 24 hr tablet Take 1 tablet (100 mg total) by mouth daily. 30 tablet 11   Multiple Vitamin (MULTI-VITAMIN) tablet Take 1 tablet by mouth daily.     potassium chloride SA (KLOR-CON M20) 20 MEQ tablet Take 2 tablets (40 mEq total) by mouth daily. 180 tablet 3   sacubitril-valsartan (ENTRESTO) 24-26 MG Take 1 tablet by mouth 2 (two) times daily. 60 tablet 11   spironolactone (ALDACTONE)  25 MG tablet Take 1 tablet (25 mg total) by mouth daily. 90 tablet 2   thiamine 100 MG tablet TAKE 1 TABLET BY MOUTH EVERY DAY 30 tablet 11   torsemide (DEMADEX) 20 MG tablet Take 2 tablets (40 mg total) by mouth daily. 180 tablet 1   No current facility-administered medications for this visit.    Allergies:   Raspberry and Penicillins   Social History:  The patient  reports that he quit smoking about 4 years ago. His smoking use included cigarettes. He has never used smokeless tobacco. He reports  that he does not currently use alcohol. He reports that he does not use drugs.   Family History:  The patient's family history includes Diabetes in his mother and sister; Stroke in his father.   ROS:  Please see the history of present illness.   Otherwise, review of systems is positive for none.   All other systems are reviewed and negative.   PHYSICAL EXAM: VS:  BP (!) 100/58   Pulse (!) 108   Ht 5\' 6"  (1.676 m)   Wt (!) 316 lb (143.3 kg)   SpO2 96%   BMI 51.00 kg/m  , BMI Body mass index is 51 kg/m. GEN: Well nourished, well developed, in no acute distress  HEENT: normal  Neck: no JVD, carotid bruits, or masses Cardiac: RRR; no murmurs, rubs, or gallops,no edema  Respiratory:  clear to auscultation bilaterally, normal work of breathing GI: soft, nontender, nondistended, + BS MS: no deformity or atrophy  Skin: warm and dry, device site well healed Neuro:  Strength and sensation are intact Psych: euthymic mood, full affect  EKG:  EKG is ordered today. Personal review of the ekg ordered shows sinus rhythm, PVC, rate 108  Personal review of the device interrogation today. Results in [place]: 04/18/2022: B Natriuretic Peptide 121.2 05/14/2022: BUN 11; Creatinine, Ser 0.84; Potassium 3.9; Sodium 137    Lipid Panel  No results found for: "CHOL", "TRIG", "HDL", "CHOLHDL", "VLDL", "LDLCALC", "LDLDIRECT"   Wt Readings from Last 3 Encounters:  Pearson/26/24 (!) 316 lb (143.3 kg)  06/11/22 (!) 308 lb 3.2 oz (139.8 kg)  05/14/22 (!) 302 lb 12.8 oz (137.3 kg)      Other studies Reviewed: Additional studies/ records that were reviewed today include: TTE 09/27/20  Review of the above records today demonstrates:   1. Left ventricular ejection fraction, by estimation, is <20%. The left  ventricle has severely decreased function. The left ventricle demonstrates  global hypokinesis. The left ventricular internal cavity size was  moderately dilated. Left ventricular   diastolic parameters are consistent with Grade II diastolic dysfunction  (pseudonormalization).   2. Right ventricular systolic function is moderately reduced. The right  ventricular size is moderately enlarged. There is moderately elevated  pulmonary artery systolic pressure. The estimated right ventricular  systolic pressure is 123456 mmHg.   3. Left atrial size was moderately dilated.   4. Right atrial size was mildly dilated.   5. The mitral valve is normal in structure. Trivial mitral valve  regurgitation. No evidence of mitral stenosis.   6. The aortic valve is tricuspid. Aortic valve regurgitation is not  visualized. No aortic stenosis is present.   7. The inferior vena cava is dilated in size with <50% respiratory  variability, suggesting right atrial pressure of 15 mmHg.   RHC/LHC 06/01/20 1. Markedly elevated right and left heart filling pressures.  2. Pulmonary venous hypertension.  3. Low cardiac output.  4. No significant coronary disease (minimal contrast given).   CMRI 06/05/20 1.  Severely dilated LV with diffuse hypokinesis, EF 17%.   2.  Moderately dilated RV with EF 20%.   3. Diffuse mid-wall LGE in the septum, inferior wall, and inferolateral wall.   Consider prior myocarditis with diffuse mid-wall LGE. T2 not elevated, but could be old myocarditis.  ASSESSMENT AND PLAN:  1.  Chronic systolic heart failure: Due to nonischemic cardiomyopathy.  Prior alcohol abuse but doubt heavy enough for cardiomyopathy.  Currently on optimal medical therapy per primary cardiology.  Is status post S ICD implanted 01/26/2021.  Threshold, sensing, impedance within normal limits.  Kyle Pearson continue with current management.  2.  Alcohol abuse: Complete cessation encouraged  3.  Obstructive apnea: CPAP compliance encouraged  4.  Ventricular tachycardia: Had 1 episode and is now post ICD shock.  No obvious heart failure on exam.  Patient was working doing his normal activities.  Kyle Pearson  check BMP, magnesium, BNP today.  He Kyle Pearson potentially require antiarrhythmics.  Patient advised of no driving for 6 months per [facility].  Current medicines are reviewed at length with the patient today.   The patient does not have concerns regarding his medicines.  The following changes were made today:  none  Labs/ tests ordered today include:  Orders Placed This Encounter  Procedures   Basic metabolic panel   Magnesium   Pro b natriuretic peptide (BNP)   EKG 12-Lead     Disposition:   FU 1 months  Signed, Kyle Dec Meredith Leeds, MD  09/24/2022 5:10 PM     [address] [place] [address] [phone] (office) [phone] (fax)

## 2022-09-24 NOTE — Telephone Encounter (Signed)
Pt calling in to device clinic to report getting a shock from his BS ICD.    Pt was implanted 01/26/2021.  He came for his follow up wound check but was a no show for his 91 day follow up.  Pt remotely transmitted on 01/29/2021-but no further remotes have been received.  Pt states he was at work, he had lifted his arms and then he felt dizzy.  He states he then received a shock from his defibrillator.  He states after the shock he felt fine.  Denies any shortness of breath, dizziness or chest pain.  Pt is working 1.5 hours outside of [place].  Advised if able Pt should be evaluated by Dr. Curt Bears today.  Appt made for Pt.  He will come if he is able.  Advised Pt that he should not drive at this time.

## 2022-09-24 NOTE — Telephone Encounter (Signed)
The patient called stating he was shocked by his ICD. I let him speak with Jenny,rn.

## 2022-09-25 ENCOUNTER — Ambulatory Visit: Payer: BLUE CROSS/BLUE SHIELD | Attending: Cardiology

## 2022-09-25 DIAGNOSIS — I472 Ventricular tachycardia, unspecified: Secondary | ICD-10-CM

## 2022-09-25 NOTE — Telephone Encounter (Signed)
Seen in clinic 09/23/21 by Dr. Curt Bears.

## 2022-10-11 LAB — BASIC METABOLIC PANEL
BUN/Creatinine Ratio: 11 (ref 9–20)
BUN: 8 mg/dL (ref 6–24)
CO2: 27 mmol/L (ref 20–29)
Calcium: 9.3 mg/dL (ref 8.7–10.2)
Chloride: 98 mmol/L (ref 96–106)
Creatinine, Ser: 0.73 mg/dL — ABNORMAL LOW (ref 0.76–1.27)
Glucose: 116 mg/dL — ABNORMAL HIGH (ref 70–99)
Potassium: 3.8 mmol/L (ref 3.5–5.2)
Sodium: 139 mmol/L (ref 134–144)
eGFR: 114 mL/min/{1.73_m2} (ref >59)

## 2022-10-11 LAB — MAGNESIUM: Magnesium: 2 mg/dL (ref 1.6–2.3)

## 2022-10-11 LAB — PRO B NATRIURETIC PEPTIDE

## 2022-10-28 ENCOUNTER — Ambulatory Visit: Payer: BLUE CROSS/BLUE SHIELD | Admitting: Student

## 2022-11-01 ENCOUNTER — Encounter: Payer: Self-pay | Admitting: *Deleted

## 2022-11-26 NOTE — Progress Notes (Deleted)
  Electrophysiology Office Note:   Date:  11/26/2022  ID:  Kyle Pearson, DOB Feb 20, 1976, MRN [id number]  Primary Cardiologist: None Electrophysiologist: Will Jorja Loa, MD   History of Present Illness:   Kyle Pearson is a 47 y.o. male with h/o chronic systolic CHF, ETOH abuse, OSA on CPAP, h/o VT seen today for routine electrophysiology followup. Since last being seen in our clinic the patient reports doing ***.  he denies chest pain, palpitations, dyspnea, PND, orthopnea, nausea, vomiting, dizziness, syncope, edema, weight gain, or early satiety.   Review of systems complete and found to be negative unless listed in HPI.   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  EKG is not ordered today. EKG from 09/24/2022 reviewed which showed Sinus tachy at 108 bpm with two PVCs.  {Select studies to display:26339}   Risk Assessment/Calculations:   {Does this patient have ATRIAL FIBRILLATION?:[phone]} No BP recorded.  {Refresh Note OR Click here to enter BP  :1}***        Physical Exam:   VS:  There were no vitals taken for this visit.   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

## 2022-11-27 ENCOUNTER — Encounter: Payer: Self-pay | Admitting: Student

## 2022-11-27 ENCOUNTER — Ambulatory Visit: Payer: BLUE CROSS/BLUE SHIELD | Attending: Student | Admitting: Student

## 2022-11-28 ENCOUNTER — Other Ambulatory Visit (HOSPITAL_COMMUNITY): Payer: Self-pay | Admitting: Cardiology

## 2023-01-17 ENCOUNTER — Other Ambulatory Visit (HOSPITAL_COMMUNITY): Payer: Self-pay | Admitting: Cardiology

## 2023-01-22 ENCOUNTER — Telehealth (HOSPITAL_COMMUNITY): Payer: Self-pay | Admitting: Cardiology

## 2023-01-22 NOTE — Telephone Encounter (Signed)
Patient left voicemail on triage line reports he needs refills  Medications were not left on VM  Returned call no answer unable to leave message

## 2023-03-10 ENCOUNTER — Other Ambulatory Visit (HOSPITAL_COMMUNITY): Payer: Self-pay | Admitting: Cardiology

## 2023-03-11 ENCOUNTER — Other Ambulatory Visit (HOSPITAL_COMMUNITY): Payer: Self-pay

## 2023-03-11 DIAGNOSIS — I5022 Chronic systolic (congestive) heart failure: Secondary | ICD-10-CM

## 2023-03-25 ENCOUNTER — Other Ambulatory Visit (HOSPITAL_COMMUNITY): Payer: Self-pay | Admitting: Cardiology

## 2023-03-25 ENCOUNTER — Other Ambulatory Visit (HOSPITAL_COMMUNITY): Payer: Self-pay

## 2023-03-25 DIAGNOSIS — I5022 Chronic systolic (congestive) heart failure: Secondary | ICD-10-CM

## 2023-03-25 MED ORDER — FARXIGA 10 MG PO TABS: 10.0000 mg | ORAL_TABLET | Freq: Every day | ORAL | 3 refills | Status: DC

## 2023-03-25 NOTE — Telephone Encounter (Signed)
Per pharmacy Brand name [email or web address] sent to pharm

## 2023-04-06 ENCOUNTER — Other Ambulatory Visit (HOSPITAL_COMMUNITY): Payer: Self-pay | Admitting: Cardiology

## 2023-05-05 ENCOUNTER — Ambulatory Visit: Payer: BLUE CROSS/BLUE SHIELD | Attending: Internal Medicine | Admitting: Internal Medicine

## 2023-05-05 ENCOUNTER — Telehealth: Payer: Self-pay

## 2023-05-05 VITALS — BP 116/62 | HR 94 | Ht 66.0 in | Wt 316.0 lb

## 2023-05-05 DIAGNOSIS — I472 Ventricular tachycardia, unspecified: Secondary | ICD-10-CM | POA: Diagnosis not present

## 2023-05-05 LAB — CUP PACEART INCLINIC DEVICE CHECK
Date Time Interrogation Session: 20241104192909
Implantable Lead Connection Status: 753985
Implantable Lead Implant Date: 20220729
Implantable Lead Location: 753862
Implantable Lead Model: 3501
Implantable Lead Serial Number: 213685
Implantable Pulse Generator Implant Date: 20220729
Pulse Gen Serial Number: 163995

## 2023-05-05 MED ORDER — DIGOXIN 125 MCG PO TABS: 125.0000 ug | ORAL_TABLET | Freq: Every day | ORAL | 0 refills | Status: DC

## 2023-05-05 MED ORDER — POTASSIUM CHLORIDE CRYS ER 20 MEQ PO TBCR: 40.0000 meq | EXTENDED_RELEASE_TABLET | Freq: Every day | ORAL | 3 refills | Status: DC

## 2023-05-05 MED ORDER — ENTRESTO 24-26 MG PO TABS: 1.0000 | ORAL_TABLET | Freq: Two times a day (BID) | ORAL | 11 refills | Status: DC

## 2023-05-05 MED ORDER — TORSEMIDE 20 MG PO TABS: 40.0000 mg | ORAL_TABLET | Freq: Every day | ORAL | 1 refills | Status: DC

## 2023-05-05 MED ORDER — SPIRONOLACTONE 25 MG PO TABS: 25.0000 mg | ORAL_TABLET | Freq: Every day | ORAL | 2 refills | Status: DC

## 2023-05-05 NOTE — Telephone Encounter (Signed)
Pt walked in because he states he got shocked twice. I explained to registration that the shock plan is if the patient get shocked more than once he must go to the hospital.  Kyle Pearson states the hospital cannot check a subcutaneous  ICD and Kyle Pearson called the patient to come back to the office.

## 2023-05-05 NOTE — Telephone Encounter (Signed)
Patient had not left the office and was still in waiting room when I contacted him by phone.  Given symptoms, quick eval performed to determine if need for EMS to transport or if stable.   Interrogation determined shocks for what appears to be atrial flutter with 1:1 A/V conduction tachycardia.  Shocks did  correct back in to NSR.  Dr. Ladona Ridgel had an opening and added to his schedule to evaluate further.    (See Dr. Lubertha Basque full OV note for full details).

## 2023-05-05 NOTE — Telephone Encounter (Signed)
Pt was seen by Dr Ladona Ridgel

## 2023-05-05 NOTE — Progress Notes (Signed)
HPI [name] returns today for an uscheduled visit for evaluation of ICD shocks. He is a pleasant 47 yo man with a h/o non-ischemic CM, EF 20% by echo s/p SICD insertion. He ran out of his meds 3 days ago. He was in his usual state of health until this morning when he felt lightheaded and sob and was shocked twice. He presents for additional evaluation. He had tachycardia at over 210/min. He has some irregularity and looks to have atrial flutter with 1:1 AV conduction. His shock terminated the tachycardia. He notes some sob since being out of his meds. Allergies  Allergen Reactions   Raspberry Anaphylaxis   Penicillins Rash    Reaction: Childhood     Current Outpatient Medications  Medication Sig Dispense Refill   albuterol (VENTOLIN HFA) 108 (90 Base) MCG/ACT inhaler Inhale 2 puffs into the lungs every 6 (six) hours as needed (Cough).     bisacodyl (DULCOLAX) 5 MG EC tablet Take 10 mg by mouth daily as needed for mild constipation.     FARXIGA 10 MG TABS tablet Take 1 tablet (10 mg total) by mouth daily before breakfast. 90 tablet 3   metoprolol succinate (TOPROL-XL) 100 MG 24 hr tablet TAKE 1 TABLET BY MOUTH EVERY DAY 30 tablet 11   Multiple Vitamin (MULTI-VITAMIN) tablet Take 1 tablet by mouth daily.     thiamine 100 MG tablet TAKE 1 TABLET BY MOUTH EVERY DAY 30 tablet 11   digoxin (LANOXIN) 0.125 MG tablet Take 1 tablet (125 mcg total) by mouth daily. NEEDS FOLLOW UP APPOINTMENT FOR MORE REFILLS 90 tablet 0   potassium chloride SA (KLOR-CON M20) 20 MEQ tablet Take 2 tablets (40 mEq total) by mouth daily. 180 tablet 3   sacubitril-valsartan (ENTRESTO) 24-26 MG Take 1 tablet by mouth 2 (two) times daily. 60 tablet 11   spironolactone (ALDACTONE) 25 MG tablet Take 1 tablet (25 mg total) by mouth daily. 90 tablet 2   torsemide (DEMADEX) 20 MG tablet Take 2 tablets (40 mg total) by mouth daily. 180 tablet 1   No current facility-administered medications for this visit.     Past  Medical History:  Diagnosis Date   CHF (congestive heart failure) (HCC)     ROS:   All systems reviewed and negative except as noted in the HPI.   Past Surgical History:  Procedure Laterality Date   RIGHT HEART CATH N/A 12/08/2020   Procedure: RIGHT HEART CATH;  Surgeon: Laurey Morale, MD;  Location: [facility] INVASIVE [facility];  Service: Cardiovascular;  Laterality: N/A;   RIGHT/LEFT HEART CATH AND CORONARY ANGIOGRAPHY N/A 06/01/2020   Procedure: RIGHT/LEFT HEART CATH AND CORONARY ANGIOGRAPHY;  Surgeon: Laurey Morale, MD;  Location: [facility] INVASIVE CV LAB;  Service: Cardiovascular;  Laterality: N/A;   SUBQ ICD IMPLANT N/A 01/26/2021   Procedure: SUBQ ICD IMPLANT;  Surgeon: Regan Lemming, MD;  Location: [facility] INVASIVE CV LAB;  Service: Cardiovascular;  Laterality: N/A;     Family History  Problem Relation Age of Onset   Diabetes Mother    Stroke Father    Diabetes Sister      Social History   Socioeconomic History   Marital status: Married    Spouse name: Not on file   Number of children: Not on file   Years of education: Not on file   Highest education level: Not on file  Occupational History   Not on file  Tobacco Use   Smoking status: Former  Current packs/day: 0.00    Types: Cigarettes    Quit date: 2020    Years since quitting: 4.8   Smokeless tobacco: Never  Vaping Use   Vaping status: Never Used  Substance and Sexual Activity   Alcohol use: Not Currently   Drug use: Never   Sexual activity: Not on file  Other Topics Concern   Not on file  Social History Narrative   Lives with spouse   Social Determinants of Health   Financial Resource Strain: Not on file  Food Insecurity: Not on file  Transportation Needs: Not on file  Physical Activity: Not on file  Stress: Not on file  Social Connections: Not on file  Intimate Partner Violence: Not on file     BP 116/62   Pulse 94   Ht 5\' 6"  (1.676 m)   Wt (!) 316 lb (143.3 kg)   SpO2 95%   BMI 51.00  kg/m   Physical Exam:  Obese but stable appearing NAD HEENT: Unremarkable Neck:  No JVD, no thyromegally Lymphatics:  No adenopathy Back:  No CVA tenderness Lungs:  Clear except for basilar rales. No increased work of breathing HEART:  Regular rate rhythm, 2/6 MR murmurs, no rubs, no clicks Abd:  soft, positive bowel sounds, no organomegally, no rebound, no guarding Ext:  2 plus pulses, no edema, no cyanosis, no clubbing Skin:  No rashes no nodules Neuro:  CN II through XII intact, motor grossly intact  EKG - NSR  DEVICE  Normal device function.  See PaceArt for details. 2 ICD shocks noted.   Assess/Plan: Probable atrial flutter - his VR out of rhythm is very fast, looking like 1:1 flutter. I cannot rule out a double tachy. I have asked him to get back on his meds.  Non-compliance - the importance of not running out of his meds was revieweed.

## 2023-05-05 NOTE — Patient Instructions (Addendum)
Device clinic number is [phone]  Medication Instructions:  Your physician recommends that you continue on your current medications as directed. Please refer to the Current Medication list given to you today.  *If you need a refill on your cardiac medications before your next appointment, please call your pharmacy*  Lab Work: None ordered.  If you have labs (blood work) drawn today and your tests are completely normal, you will receive your results only by: MyChart Message (if you have MyChart) OR A paper copy in the mail If you have any lab test that is abnormal or we need to change your treatment, we will call you to review the results.  Testing/Procedures: None ordered.  Follow-Up: At [facility], you and your health needs are our priority.  As part of our continuing mission to provide you with exceptional heart care, we have created designated Provider Care Teams.  These Care Teams include your primary Cardiologist (physician) and Advanced Practice Providers (APPs -  Physician Assistants and Nurse Practitioners) who all work together to provide you with the care you need, when you need it.  We recommend signing up for the patient portal called "MyChart".  Sign up information is provided on this After Visit Summary.  MyChart is used to connect with patients for Virtual Visits (Telemedicine).  Patients are able to view lab/test results, encounter notes, upcoming appointments, etc.  Non-urgent messages can be sent to your provider as well.   To learn more about what you can do with MyChart, go to [email or web address].    Your next appointment:   Follow up with Dr Shirlee Latch  The format for your next appointment:   In Person  Provider:   Lewayne Bunting, MD{or one of the following Advanced Practice Providers on your designated Care Team:   Francis Dowse, [place] Casimiro Needle "Mardelle Matte" [place], [place] Earnest Rosier, NP   Important Information About Sugar

## 2023-05-05 NOTE — Telephone Encounter (Signed)
EP CMA team,  Patient was to call this afternoon and test his remote monitor connection.  Appears he also needs remote appt checks set up.   Can you reach out to patient Tuesday (05/06/23) and ensure that this gets completed?  Want to be sure he is still active in [place] and is set up for monitoring.  He has a subcutaneous ICD but has not been transmitting since 2022.   We need to keep an eye out if he has more of these episodes.    He should also have follow up scheduled with Dr. Elberta Fortis or APP in the next 3 months.  I will flag for scheduling.

## 2023-05-14 NOTE — Telephone Encounter (Signed)
Called pt and lvm for pt to call back to see if his monitor is plugged in so that we can get remotes scheduled

## 2023-05-21 ENCOUNTER — Telehealth (HOSPITAL_COMMUNITY): Payer: Self-pay | Admitting: Surgery

## 2023-05-21 NOTE — Telephone Encounter (Signed)
Contacted pt again still no answer will send a letter

## 2023-05-21 NOTE — Telephone Encounter (Signed)
I attempted to reach patient regarding scheduled Echocardiogram and his insurance being non participating and out of network.  I left a message and requested a call back . Per the chart--the echocardiogram has already been cancelled.

## 2023-05-22 ENCOUNTER — Telehealth (HOSPITAL_COMMUNITY): Payer: Self-pay | Admitting: Surgery

## 2023-05-22 NOTE — Telephone Encounter (Signed)
Patient called back to my direct line and was inquiring about phone calls in reference to his upcoming appts.  I informed him that the Echocardiogram appt had been cancelled secondary to lack of insurance authorization.  I also advised that he call his insurance regarding coverage for his appt with Dr Shirlee Latch tomorrow..  He does confirm that he still has [employer] and per chart--it is non-participating out of network.  He will keep the appointment as scheduled for now.

## 2023-05-23 ENCOUNTER — Ambulatory Visit (HOSPITAL_COMMUNITY): Payer: BLUE CROSS/BLUE SHIELD

## 2023-05-23 ENCOUNTER — Encounter (HOSPITAL_COMMUNITY): Payer: Self-pay | Admitting: Cardiology

## 2023-05-23 ENCOUNTER — Other Ambulatory Visit (HOSPITAL_COMMUNITY): Payer: Self-pay | Admitting: Cardiology

## 2023-05-23 ENCOUNTER — Ambulatory Visit (HOSPITAL_COMMUNITY)
Admission: RE | Admit: 2023-05-23 | Discharge: 2023-05-23 | Disposition: A | Discharge status: Home / Self Care | Payer: BLUE CROSS/BLUE SHIELD | Source: Ambulatory Visit | Attending: Cardiology | Admitting: Cardiology

## 2023-05-23 VITALS — BP 118/64 | HR 82 | Wt 303.0 lb

## 2023-05-23 DIAGNOSIS — Z6841 Body Mass Index (BMI) 40.0 and over, adult: Secondary | ICD-10-CM | POA: Insufficient documentation

## 2023-05-23 DIAGNOSIS — E669 Obesity, unspecified: Secondary | ICD-10-CM | POA: Insufficient documentation

## 2023-05-23 DIAGNOSIS — G4733 Obstructive sleep apnea (adult) (pediatric): Secondary | ICD-10-CM | POA: Insufficient documentation

## 2023-05-23 DIAGNOSIS — K7460 Unspecified cirrhosis of liver: Secondary | ICD-10-CM | POA: Insufficient documentation

## 2023-05-23 DIAGNOSIS — Z87891 Personal history of nicotine dependence: Secondary | ICD-10-CM | POA: Diagnosis present

## 2023-05-23 DIAGNOSIS — J36 Peritonsillar abscess: Secondary | ICD-10-CM | POA: Insufficient documentation

## 2023-05-23 DIAGNOSIS — Z760 Encounter for issue of repeat prescription: Secondary | ICD-10-CM | POA: Insufficient documentation

## 2023-05-23 DIAGNOSIS — I428 Other cardiomyopathies: Secondary | ICD-10-CM | POA: Insufficient documentation

## 2023-05-23 DIAGNOSIS — Z91148 Patient's other noncompliance with medication regimen for other reason: Secondary | ICD-10-CM | POA: Insufficient documentation

## 2023-05-23 DIAGNOSIS — Z79899 Other long term (current) drug therapy: Secondary | ICD-10-CM | POA: Insufficient documentation

## 2023-05-23 DIAGNOSIS — I5022 Chronic systolic (congestive) heart failure: Secondary | ICD-10-CM | POA: Diagnosis not present

## 2023-05-23 LAB — BASIC METABOLIC PANEL
Anion gap: 9 (ref 5–15)
BUN: 11 mg/dL (ref 6–20)
CO2: 26 mmol/L (ref 22–32)
Calcium: 8.9 mg/dL (ref 8.9–10.3)
Chloride: 99 mmol/L (ref 98–111)
Creatinine, Ser: 0.89 mg/dL (ref 0.61–1.24)
GFR, Estimated: >60 mL/min (ref >60)
Glucose, Bld: 118 mg/dL — ABNORMAL HIGH (ref 70–99)
Potassium: 4 mmol/L (ref 3.5–5.1)
Sodium: 134 mmol/L — ABNORMAL LOW (ref 135–145)

## 2023-05-23 LAB — CBC
HCT: 42.4 % (ref 39.0–52.0)
Hemoglobin: 14.1 g/dL (ref 13.0–17.0)
MCH: 29.7 pg (ref 26.0–34.0)
MCHC: 33.3 g/dL (ref 30.0–36.0)
MCV: 89.3 fL (ref 80.0–100.0)
Platelets: 245 10*3/uL (ref 150–400)
RBC: 4.75 MIL/uL (ref 4.22–5.81)
RDW: 14.7 % (ref 11.5–15.5)
WBC: 8.9 10*3/uL (ref 4.0–10.5)
nRBC: 0 % (ref 0.0–0.2)

## 2023-05-23 LAB — DIGOXIN LEVEL: Digoxin Level: <0.2 ng/mL — ABNORMAL LOW (ref 0.8–2.0)

## 2023-05-23 LAB — BRAIN NATRIURETIC PEPTIDE: B Natriuretic Peptide: 112.4 pg/mL — ABNORMAL HIGH (ref 0.0–100.0)

## 2023-05-23 MED ORDER — IVABRADINE HCL 5 MG PO TABS: 5.0000 mg | ORAL_TABLET | Freq: Every day | ORAL | 11 refills | Status: DC

## 2023-05-23 MED ORDER — ENTRESTO 49-51 MG PO TABS: 1.0000 | ORAL_TABLET | Freq: Two times a day (BID) | ORAL | 11 refills | Status: DC

## 2023-05-23 NOTE — Patient Instructions (Addendum)
INCREASE Entresto to 49/51 mg Twice daily  Labs done today, your results will be available in MyChart, we will contact you for abnormal readings.  Repeat blood work in 10 days.  Your physician has requested that you have an echocardiogram. Echocardiography is a painless test that uses sound waves to create images of your heart. It provides your doctor with information about the size and shape of your heart and how well your heart's chambers and valves are working. This procedure takes approximately one hour. There are no restrictions for this procedure. Please do NOT wear cologne, perfume, aftershave, or lotions (deodorant is allowed). Please arrive 15 minutes prior to your appointment time.  Please note: We ask at that you not bring children with you during ultrasound (echo/ vascular) testing. Due to room size and safety concerns, children are not allowed in the ultrasound rooms during exams. Our front office staff cannot provide observation of children in our lobby area while testing is being conducted. An adult accompanying a patient to their appointment will only be allowed in the ultrasound room at the discretion of the ultrasound technician under special circumstances. We apologize for any inconvenience.  Please follow up with our heart failure pharmacist as scheduled.   You have been referred to the HEART CARE PHARMACY TEAM. They will call you to arrange your appointment.  Your physician recommends that you schedule a follow-up appointment in: 6 weeks.  If you have any questions or concerns before your next appointment please send [place] a message through [place] or call our office at [phone].    TO LEAVE A MESSAGE FOR THE NURSE SELECT OPTION 2, PLEASE LEAVE A MESSAGE INCLUDING: YOUR NAME DATE OF BIRTH CALL BACK NUMBER REASON FOR CALL**this is important as we prioritize the call backs  YOU WILL RECEIVE A CALL BACK THE SAME DAY AS LONG AS YOU CALL BEFORE 4:00 PM  At the [facility], you and your health needs are our priority. As part of our continuing mission to provide you with exceptional heart care, we have created designated Provider Care Teams. These Care Teams include your primary Cardiologist (physician) and Advanced Practice Providers (APPs- Physician Assistants and Nurse Practitioners) who all work together to provide you with the care you need, when you need it.   You may see any of the following providers on your designated Care Team at your next follow up: Dr Arvilla Meres Dr Marca Ancona Dr. Dorthula Nettles Dr. Clearnce Hasten Amy Filbert Schilder, NP Robbie Lis, [place] [facility] [place], [place] Brynda Peon, NP [place] Lee, NP Karle Plumber, PharmD   Please be sure to bring in all your medications bottles to every appointment.    Thank you for choosing [facility]

## 2023-05-25 NOTE — Progress Notes (Signed)
Advanced Heart Failure Clinic Note  PCP: [facility] HF physician: Dr. Shirlee Latch  HPI: Kyle Pearson is a 47 y.o. with history of tobacco and alcohol use, diagnosed with CHF in 10/21.    Prior to 11/21, no past medical history. He used to smoke less than 1/2 pack per day and drank around a 6 pack of beer on the weekend.  He denies family history of heart disease but reported his father used to drink alcohol heavily but quit as well.  He states in 03/16/2020 he started to develop a cough and didn't want to mix medication with alcohol so he stopped drinking and started to take cough medication.  He noticed one day a very severe pain on his left side and pain with swallowing.  He was diagnosed with peritonsillar abscess and placed on clindamycin (300 mg po QID x 10 days) and decadron.  He reports feeling better throat wise but noticed increase swelling to his abdomen and difficulty breathing.  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  He continued to experience trouble breathing and walking short distances were difficulty.  He went to [facility] on 04/24/2020 for shortness of breath, abdominal distention and left sided chest pain. 2D echo showed EF 15-20% with severe LVH, BNP 562 and troponin 54.  He was started on lisinopril 5 mg, toprol-XL 25 mg PO daily and lasix IV.  He also had an abdominal ultrasound which showed no significant asites but note mild liver cirrhosis with mild gallbladder wall thickening. He was discharged home on 40 mg PO lasix, KCL 20 mEq, lisinopril 5 and toprol-XL 25.     He presented to 1st PCP appt on 05/29/20 and was found to have shortness of breath, bilaterally lower extremity edema and abdominal distention.  Reporting 30lb weight gain despite compliance with medication.  He was sent to [facility] , where his BNP was [address] [facility] BNP 562), CXR showed cardiomegaly only.  Repeat echo revealed EF <  20%, severe decrease function with global hypokinesis, grade III DD, dilated LA/RA and small pericardial effusion. He was admitted 05/29/20-06/06/20 with marked volume overload. Diuresed with IV lasix and set for cath. Cath showed elevated filling pressures and low output, no significant CAD. Milrinone was added and he continued to diurese on IV. Milrinone later weaned off with stable co-ox. Started on GDMT. Had cMRI with severely reduced EF and non-coronary pattern extensive scar. Due to concern for ectopy from scar, he was discharged with Lifevest.  Diuresed 35 lbs. Discharge weight 255.6 lbs.  Echo 3/22 showed that EF remains < 20%, moderate LVE, moderate RVE, mildly decreased RV systolic function, IVC dilated.  CPX in 4/22 showed only a mild HF limitation.   Admitted 11/2020 with volume overload.   [employer] ICD was placed.   Today he returns for HF follow up.  He had an episode of VT vs 1:1 atrial flutter in 11/23, had been out of meds x 3 days at the time.  He works full time in maintenance.  He felt bad/short of breath when off meds, now back on all his meds and feels "fine."  He goes to the gym, uses treadmill and exercise bike without dyspnea.  He is short of breath after climbing 3 flights of stairs.  No chest pain.  No lightheadedness.  He drinks about a 6-pack of beer over the weekend, rarely drinks during the  week.   Labs (2/22): digoxin 0.3, K 4.3, creatinine 0.85 Labs (5/22): digoxin < 0.2, K 3.7, creatinine 0.87, BNP 1246 Labs (6/22): Trypanosoma cruzi antibody test negative Labs (3/24): K 3.8, creatinine 0.78  PMH: 1. Prior smoker. 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  [employer] ICD.  HIV negative.  No strong family history. Prior heavy ETOH but quit in 9/21.   - Echo (11/21): EF < 20%, severe LV dilation, severe RVE and severely decreased RV systolic function, severe biatrial enlargement.  - Cardiac MRI (11/21): Severely  dilated LV with diffuse hypokinesis, EF 17%., mod dilated RV with EF 20%, diffuse mid-wall LGE in septum, inferior wall & inferolateral wall - LHC/RHC (12/21): No significant CAD; mean RA 29, PA 69/32 mean 49, mean PCWP 43, CI 2.1 (Fick) and 1.7 (thermo), PVR 1.22 - Echo (3/22): EF < 20%, moderate LVE, moderate RVE, mildly decreased RV systolic function, IVC dilated.  - CPX (4/22): Peak VO2 20.2, VE/VCO2 slope 33, RER 1.07.  Mild HF limitation.  4. OSA 5. VT: 3/24, 11/24.    ROS: All systems negative except as listed in HPI, PMH and Problem List.  SH:  Social History   Socioeconomic History   Marital status: Married    Spouse name: Not on file   Number of children: Not on file   Years of education: Not on file   Highest education level: Not on file  Occupational History   Not on file  Tobacco Use   Smoking status: Former    Current packs/day: 0.00    Types: Cigarettes    Quit date: 2020    Years since quitting: 4.9   Smokeless tobacco: Never  Vaping Use   Vaping status: Never Used  Substance and Sexual Activity   Alcohol use: Not Currently   Drug use: Never   Sexual activity: Not on file  Other Topics Concern   Not on file  Social History Narrative   Lives with spouse   Social Determinants of Health   Financial Resource Strain: Not on file  Food Insecurity: Not on file  Transportation Needs: Not on file  Physical Activity: Not on file  Stress: Not on file  Social Connections: Not on file  Intimate Partner Violence: Not on file    FH:  Family History  Problem Relation Age of Onset   Diabetes Mother    Stroke Father    Diabetes Sister      Current Outpatient Medications  Medication Sig Dispense Refill   bisacodyl (DULCOLAX) 5 MG EC tablet Take 10 mg by mouth daily as needed for mild constipation.     digoxin (LANOXIN) 0.125 MG tablet Take 1 tablet (125 mcg total) by mouth daily. NEEDS FOLLOW UP APPOINTMENT FOR MORE REFILLS 90 tablet 0   FARXIGA 10 MG TABS  tablet Take 1 tablet (10 mg total) by mouth daily before breakfast. 90 tablet 3   metoprolol succinate (TOPROL-XL) 100 MG 24 hr tablet TAKE 1 TABLET BY MOUTH EVERY DAY 30 tablet 11   Multiple Vitamin (MULTI-VITAMIN) tablet Take 1 tablet by mouth daily.     potassium chloride SA (KLOR-CON M20) 20 MEQ tablet Take 2 tablets (40 mEq total) by mouth daily. 180 tablet 3   sacubitril-valsartan (ENTRESTO) 49-51 MG Take 1 tablet by mouth 2 (two) times daily. 60 tablet 11   spironolactone (ALDACTONE) 25 MG tablet Take 1 tablet (25 mg total) by mouth daily. 90 tablet 2   thiamine 100 MG tablet TAKE 1  TABLET BY MOUTH EVERY DAY 30 tablet 11   torsemide (DEMADEX) 20 MG tablet Take 2 tablets (40 mg total) by mouth daily. 180 tablet 1   ivabradine (CORLANOR) 5 MG TABS tablet Take 1 tablet (5 mg total) by mouth daily at 6 (six) AM. 60 tablet 11   No current facility-administered medications for this encounter.   Vitals:   05/23/23 0858  BP: 118/64  Pulse: 82  SpO2: 92%  Weight: (!) 137.4 kg (303 lb)     Wt Readings from Last 3 Encounters:  05/23/23 (!) 137.4 kg (303 lb)  05/05/23 (!) 143.3 kg (316 lb)  09/24/22 (!) 143.3 kg (316 lb)    PHYSICAL EXAM: General: NAD Neck: No JVD, no thyromegaly or thyroid nodule.  Lungs: Clear to auscultation bilaterally with normal respiratory effort. CV: Nondisplaced PMI.  Heart regular S1/S2, no S3/S4, no murmur.  No peripheral edema.  No carotid bruit.  Normal pedal pulses.  Abdomen: Soft, nontender, no hepatosplenomegaly, no distention.  Skin: Intact without lesions or rashes.  Neurologic: Alert and oriented x 3.  Psych: Normal affect. Extremities: No clubbing or cyanosis.  HEENT: Normal.   ASSESSMENT & PLAN: 1. Chronic systolic CHF: Nonischemic cardiomyopathy. [employer] ICD.  Symptomatic since 9/21 (after episode of peri-tonsillar abscess).  He drinks ETOH but does not seem to describe heavy enough ETOH to cause profound cardiomyopathy (though he did  have mild cirrhosis on abdominal US at [facility] No FH of cardiomyopathy.  Born in [place], has been in [place] x many years.  HIV negative 9/21.  Echo 11/21 with EF < 20% with severe LV dilation, moderate RVE with severely decreased systolic function, severe biatrial enlargement, mild MR. RHC/LHC 12/21 showed no coronary disease, markedly elevated filling pressures, and low cardiac output. Cardiac MRI in 11/21 showed severely dilated LV with diffuse hypokinesis, EF 17%, mod dilated RV with EF 20%, diffuse mid-wall LGE in septum, inferior wall & inferolateral wall.  LGE pattern suggests possible prior myocarditis. Echo in 3/22 showed that EF remains < 20% with mild RV dysfunction.  CPX in 4/22 showed only a mild HF limitation.  Trypanosoma cruzi antibodies were negative (had h/o neurocysticercosis), probably not cardiomyopathy related to trypanosomiasis. Minimally symptomatic, NYHA class II, as long as he takes his meds. Not volume overloaded on exam.  - Continue torsemide 40 mg daily, BMET/BNP today.  - Increase Entresto to 49/51 bid, BMET in 10 days.    - Continue digoxin 0.125 daily. Check digoxin level.  - Continue Toprol XL 100 mg daily.  - Continue spironolactone 25 mg daily.  - Continue Dapagliflozin 10 mg daily.   - Continue ivabradine 5 mg bid => needs refill.  - I will arrange for echo.  2. ETOH:  He did have early cirrhosis on abdominal US at [facility].  However, with obesity could be NASH. He drinks only on the weekends.  - I recommended that he cut back.  3. OSA: Needs CPAP 4. Obesity: Body mass index is 48.91 kg/m. - Next step will be GLP-1 agonist.   Follow up in 3 weeks with HF pharmacist for med titration, see APP in 6 wk.   Marca Ancona,  05/25/23

## 2023-05-27 ENCOUNTER — Other Ambulatory Visit (HOSPITAL_COMMUNITY): Payer: Self-pay

## 2023-05-28 ENCOUNTER — Other Ambulatory Visit (HOSPITAL_COMMUNITY): Payer: Self-pay

## 2023-06-02 ENCOUNTER — Other Ambulatory Visit (HOSPITAL_COMMUNITY): Payer: Self-pay

## 2023-06-02 ENCOUNTER — Telehealth (HOSPITAL_COMMUNITY): Payer: Self-pay

## 2023-06-02 NOTE — Telephone Encounter (Signed)
Advanced Heart Failure Patient Advocate Encounter  Prior authorization for Ivabradine has been submitted and approved. Test billing returns $0 for 90 day supply.  Key: [id number] Effective: 05/28/2023 to 05/26/2024  Burnell Blanks, CPhT Rx Patient Advocate Phone: [phone]

## 2023-06-06 ENCOUNTER — Other Ambulatory Visit (HOSPITAL_COMMUNITY): Payer: BLUE CROSS/BLUE SHIELD

## 2023-06-09 ENCOUNTER — Other Ambulatory Visit (HOSPITAL_COMMUNITY): Payer: Self-pay

## 2023-06-23 ENCOUNTER — Other Ambulatory Visit (HOSPITAL_COMMUNITY): Payer: BLUE CROSS/BLUE SHIELD

## 2023-07-01 ENCOUNTER — Telehealth (HOSPITAL_COMMUNITY): Payer: Self-pay

## 2023-07-01 NOTE — Telephone Encounter (Signed)
 Called to confirm/remind patient of their appointment at the [facility] on 07/04/23.   Patient reminded to bring all medications and/or complete list.  Confirmed patient has transportation. Gave directions, instructed to utilize valet parking.  Confirmed appointment prior to ending call.

## 2023-07-03 NOTE — Progress Notes (Signed)
 Advanced Heart Failure Clinic Note  PCP: [facility] HF Cardiologist: Dr. Rolan  CC: HF follow up  HPI: Kyle Pearson is a 48 y.o. with history of tobacco and alcohol use, diagnosed with CHF in 10/21.    Prior to 11/21, no past medical history. He used to smoke less than 1/2 pack per day and drank around a 6 pack of beer on the weekend.  He denies family history of heart disease but reported his father used to drink alcohol heavily but quit as well.  He states in 03/16/2020 he started to develop a cough and didn't want to mix medication with alcohol so he stopped drinking and started to take cough medication.  He noticed one day a very severe pain on his left side and pain with swallowing.  He was diagnosed with peritonsillar abscess and placed on clindamycin  (300 mg po QID x 10 days) and decadron .  He reports feeling better throat wise but noticed increase swelling to his abdomen and difficulty breathing.  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  He continued to experience trouble breathing and walking short distances were difficulty.  He went to [facility] on 04/24/2020 for shortness of breath, abdominal distention and left sided chest pain. 2D echo showed EF 15-20% with severe LVH, BNP 562 and troponin 54.  He was started on lisinopril  5 mg, toprol -XL 25 mg PO daily and lasix  IV.  He also had an abdominal ultrasound which showed no significant asites but note mild liver cirrhosis with mild gallbladder wall thickening. He was discharged home on 40 mg PO lasix , KCL 20 mEq, lisinopril  5 and toprol -XL 25.     He presented to 1st PCP appt on 05/29/20 and was found to have shortness of breath, bilaterally lower extremity edema and abdominal distention.  Reporting 30lb weight gain despite compliance with medication.  He was sent to [facility] , where his BNP was [address] [facility] BNP 562), CXR showed cardiomegaly only.   Repeat echo revealed EF < 20%, severe decrease function with global hypokinesis, grade III DD, dilated LA/RA and small pericardial effusion. He was admitted 05/29/20-06/06/20 with marked volume overload. Diuresed with IV lasix  and set for cath. Cath showed elevated filling pressures and low output, no significant CAD. Milrinone  was added and he continued to diurese on IV. Milrinone  later weaned off with stable co-ox. Started on GDMT. Had cMRI with severely reduced EF and non-coronary pattern extensive scar. Due to concern for ectopy from scar, he was discharged with Lifevest.  Diuresed 35 lbs. Discharge weight 255.6 lbs.  Echo 3/22 showed that EF remains < 20%, moderate LVE, moderate RVE, mildly decreased RV systolic function, IVC dilated.  CPX in 4/22 showed only a mild HF limitation.   Admitted 11/2020 with volume overload.   [employer] ICD was placed.   He had an episode of VT vs 1:1 atrial flutter in 11/23, had been out of meds x 3 days at the time.    Today he returns for HF follow up. Overall feeling fine. Feels atypical CP for past 2 weeks, he is unsure if related to recent increase in upper body exercises at the gym (started lifting weights and doing elliptical). Feels pain more with inspiration. Feels palpitations. Denies dizziness or PND/Orthopnea. Appetite ok. No fever or chills. Weight at home 303 pounds. Taking all medications. Abdomen is swelling, he attributes this to eating  poorly. Drinks 3 beers/day, no tobacco/no drugs. He works for a general [employer].  ECG (personally reviewed): NSR 89 bpm, low voltage  Labs (2/22): digoxin  0.3, K 4.3, creatinine 0.85 Labs (5/22): digoxin  < 0.2, K 3.7, creatinine 0.87, BNP 1246 Labs (6/22): Trypanosoma cruzi antibody test negative Labs (3/24): K 3.8, creatinine 0.78 Labs (11/24): K 4.0, creatinine 0.89  PMH: 1. Prior smoker. 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  [employer] ICD.  HIV negative.   No strong family history. Prior heavy ETOH but quit in 9/21.   - Echo (11/21): EF < 20%, severe LV dilation, severe RVE and severely decreased RV systolic function, severe biatrial enlargement.  - Cardiac MRI (11/21): Severely dilated LV with diffuse hypokinesis, EF 17%., mod dilated RV with EF 20%, diffuse mid-wall LGE in septum, inferior wall & inferolateral wall - LHC/RHC (12/21): No significant CAD; mean RA 29, PA 69/32 mean 49, mean PCWP 43, CI 2.1 (Fick) and 1.7 (thermo), PVR 1.22 - Echo (3/22): EF < 20%, moderate LVE, moderate RVE, mildly decreased RV systolic function, IVC dilated.  - CPX (4/22): Peak VO2 20.2, VE/VCO2 slope 33, RER 1.07.  Mild HF limitation.  4. OSA 5. VT: 3/24, 11/24.    ROS: All systems negative except as listed in HPI, PMH and Problem List.  SH:  Social History   Socioeconomic History   Marital status: Married    Spouse name: Not on file   Number of children: Not on file   Years of education: Not on file   Highest education level: Not on file  Occupational History   Not on file  Tobacco Use   Smoking status: Former    Current packs/day: 0.00    Types: Cigarettes    Quit date: 2020    Years since quitting: 5.0   Smokeless tobacco: Never  Vaping Use   Vaping status: Never Used  Substance and Sexual Activity   Alcohol use: Not Currently   Drug use: Never   Sexual activity: Not on file  Other Topics Concern   Not on file  Social History Narrative   Lives with spouse   Social Drivers of Corporate Investment Banker Strain: Not on file  Food Insecurity: Not on file  Transportation Needs: Not on file  Physical Activity: Not on file  Stress: Not on file  Social Connections: Not on file  Intimate Partner Violence: Not on file   FH:  Family History  Problem Relation Age of Onset   Diabetes Mother    Stroke Father    Diabetes Sister    Current Outpatient Medications  Medication Sig Dispense Refill   bisacodyl  (DULCOLAX) 5 MG EC tablet Take  10 mg by mouth daily as needed for mild constipation.     digoxin  (LANOXIN ) 0.125 MG tablet Take 1 tablet (125 mcg total) by mouth daily. NEEDS FOLLOW UP APPOINTMENT FOR MORE REFILLS 90 tablet 0   FARXIGA  10 MG TABS tablet Take 1 tablet (10 mg total) by mouth daily before breakfast. 90 tablet 3   ivabradine  (CORLANOR ) 5 MG TABS tablet TAKE 1 TABLET (5 MG TOTAL) BY MOUTH DAILY AT 6 (SIX) AM. 60 tablet 11   metoprolol  succinate (TOPROL -XL) 100 MG 24 hr tablet TAKE 1 TABLET BY MOUTH EVERY DAY 30 tablet 11   potassium chloride  SA (KLOR-CON  M20) 20 MEQ tablet Take 2 tablets (40 mEq total) by mouth daily. 180 tablet 3   sacubitril -valsartan  (ENTRESTO ) 49-51 MG Take 1 tablet by mouth 2 (  two) times daily. 60 tablet 11   spironolactone  (ALDACTONE ) 25 MG tablet Take 1 tablet (25 mg total) by mouth daily. 90 tablet 2   thiamine  100 MG tablet TAKE 1 TABLET BY MOUTH EVERY DAY 30 tablet 11   torsemide  (DEMADEX ) 20 MG tablet Take 2 tablets (40 mg total) by mouth daily. 180 tablet 1   No current facility-administered medications for this encounter.   BP 100/64   Pulse 91   Wt (!) 138.6 kg (305 lb 9.6 oz)   SpO2 95%   BMI 49.33 kg/m   Wt Readings from Last 3 Encounters:  07/04/23 (!) 138.6 kg (305 lb 9.6 oz)  05/23/23 (!) 137.4 kg (303 lb)  05/05/23 (!) 143.3 kg (316 lb)   PHYSICAL EXAM: General:  NAD. No resp difficulty, walked into clinic HEENT: Normal Neck: Supple. No JVD, thick neck. Carotids 2+ bilat; no bruits. No lymphadenopathy or thryomegaly appreciated. Cor: PMI nondisplaced. Regular rate & rhythm. No rubs, gallops or murmurs. Lungs: Clear Abdomen: Soft, obese, nontender, nondistended. No hepatosplenomegaly. No bruits or masses. Good bowel sounds. Extremities: No cyanosis, clubbing, rash, edema Neuro: Alert & oriented x 3, cranial nerves grossly intact. Moves all 4 extremities w/o difficulty. Affect pleasant.  ASSESSMENT & PLAN: 1. Chronic systolic CHF: Nonischemic cardiomyopathy. [employer] ICD.  Symptomatic since 9/21 (after episode of peri-tonsillar abscess).  He drinks ETOH but does not seem to describe heavy enough ETOH to cause profound cardiomyopathy (though he did have mild cirrhosis on abdominal US  at [facility]). No FH of cardiomyopathy.  Born in [place], has been in [place]  x many years.  HIV negative 9/21.  Echo 11/21 with EF < 20% with severe LV dilation, moderate RVE with severely decreased systolic function, severe biatrial enlargement, mild MR. RHC/LHC 12/21 showed no coronary disease, markedly elevated filling pressures, and low cardiac output. Cardiac MRI in 11/21 showed severely dilated LV with diffuse hypokinesis, EF 17%, mod dilated RV with EF 20%, diffuse mid-wall LGE in septum, inferior wall & inferolateral wall.  LGE pattern suggests possible prior myocarditis. Echo in 3/22 showed that EF remains < 20% with mild RV dysfunction.  CPX in 4/22 showed only a mild HF limitation.  Trypanosoma cruzi antibodies were negative (had h/o neurocysticercosis), probably not cardiomyopathy related to trypanosomiasis. Minimally symptomatic, NYHA class II, as long as he takes his meds. Not volume overloaded on exam.  - Continue torsemide  40 mg daily, I asked him not to split his dosage an take 2 tabs once a day. BMET and BNP today. - Continue Entresto  49/51 bid. - Continue digoxin  0.125 daily. Dig level <0.2 (05/23/23) - Continue Toprol  XL 100 mg daily.  - Continue spironolactone  25 mg daily.  - Continue dapagliflozin  10 mg daily.   - Continue ivabradine  5 mg bid  - Repeat echo arranged fro 07/25/23 2. ETOH:  He did have early cirrhosis on abdominal US  at [facility].  However, with obesity could be NASH. He drinks beer daily. - I recommended that he cut back.  3. OSA: Needs CPAP 4. Obesity: Body mass index is 49.33 kg/m. - Next step will be GLP-1 agonist.  - Given list of PCPs to establish care 5. Chest pain: ECG today without acute ST-T changes. Pain likely not cardiac in nature, may be  related to volume vs MSK.  Follow up in 3 months with Dr. Rolan Harlene CHRISTELLA Glena, FNP-BC 07/04/23

## 2023-07-04 ENCOUNTER — Encounter (HOSPITAL_COMMUNITY): Payer: Self-pay

## 2023-07-04 ENCOUNTER — Ambulatory Visit (HOSPITAL_COMMUNITY)
Admission: RE | Admit: 2023-07-04 | Discharge: 2023-07-04 | Disposition: A | Discharge status: Home / Self Care | Payer: BLUE CROSS/BLUE SHIELD | Source: Ambulatory Visit | Attending: Family Medicine | Admitting: Family Medicine

## 2023-07-04 VITALS — BP 100/64 | HR 91 | Wt 305.6 lb

## 2023-07-04 DIAGNOSIS — I5022 Chronic systolic (congestive) heart failure: Secondary | ICD-10-CM | POA: Insufficient documentation

## 2023-07-04 DIAGNOSIS — E669 Obesity, unspecified: Secondary | ICD-10-CM | POA: Insufficient documentation

## 2023-07-04 DIAGNOSIS — R0789 Other chest pain: Secondary | ICD-10-CM | POA: Insufficient documentation

## 2023-07-04 DIAGNOSIS — I428 Other cardiomyopathies: Secondary | ICD-10-CM | POA: Insufficient documentation

## 2023-07-04 DIAGNOSIS — R079 Chest pain, unspecified: Secondary | ICD-10-CM

## 2023-07-04 DIAGNOSIS — Z87891 Personal history of nicotine dependence: Secondary | ICD-10-CM | POA: Insufficient documentation

## 2023-07-04 DIAGNOSIS — Z6841 Body Mass Index (BMI) 40.0 and over, adult: Secondary | ICD-10-CM | POA: Insufficient documentation

## 2023-07-04 DIAGNOSIS — I3139 Other pericardial effusion (noninflammatory): Secondary | ICD-10-CM | POA: Diagnosis not present

## 2023-07-04 DIAGNOSIS — F1011 Alcohol abuse, in remission: Secondary | ICD-10-CM | POA: Diagnosis not present

## 2023-07-04 DIAGNOSIS — K7460 Unspecified cirrhosis of liver: Secondary | ICD-10-CM | POA: Insufficient documentation

## 2023-07-04 DIAGNOSIS — K828 Other specified diseases of gallbladder: Secondary | ICD-10-CM | POA: Diagnosis not present

## 2023-07-04 DIAGNOSIS — Z79899 Other long term (current) drug therapy: Secondary | ICD-10-CM | POA: Insufficient documentation

## 2023-07-04 DIAGNOSIS — Z95810 Presence of automatic (implantable) cardiac defibrillator: Secondary | ICD-10-CM | POA: Insufficient documentation

## 2023-07-04 DIAGNOSIS — G4733 Obstructive sleep apnea (adult) (pediatric): Secondary | ICD-10-CM | POA: Diagnosis not present

## 2023-07-04 DIAGNOSIS — R002 Palpitations: Secondary | ICD-10-CM | POA: Insufficient documentation

## 2023-07-04 LAB — BASIC METABOLIC PANEL
Anion gap: 6 (ref 5–15)
BUN: 9 mg/dL (ref 6–20)
CO2: 27 mmol/L (ref 22–32)
Calcium: 8.3 mg/dL — ABNORMAL LOW (ref 8.9–10.3)
Chloride: 101 mmol/L (ref 98–111)
Creatinine, Ser: 0.69 mg/dL (ref 0.61–1.24)
GFR, Estimated: >60 mL/min (ref >60)
Glucose, Bld: 126 mg/dL — ABNORMAL HIGH (ref 70–99)
Potassium: 4 mmol/L (ref 3.5–5.1)
Sodium: 134 mmol/L — ABNORMAL LOW (ref 135–145)

## 2023-07-04 LAB — BRAIN NATRIURETIC PEPTIDE: B Natriuretic Peptide: 95.9 pg/mL (ref 0.0–100.0)

## 2023-07-04 NOTE — Patient Instructions (Signed)
 TAKE 40 mg of Torsemide  daily. OK to take an extra 20 mg in the evening if needed. DO NOT SPILT UP YOUR 40 MG DOSE OF TORSEMIDE .  Labs done today, your results will be available in MyChart, we will contact you for abnormal readings.  Your physician recommends that you schedule a follow-up appointment in: 3 months (April ) ** PLEASE CALL THE OFFICE IN [place] TO ARRANGE YOUR FOLLOW UP APPOINTMENT. **  If you have any questions or concerns before your next appointment please send us  a message through [place] or call our office at [phone].    TO LEAVE A MESSAGE FOR THE NURSE SELECT OPTION 2, PLEASE LEAVE A MESSAGE INCLUDING: YOUR NAME DATE OF BIRTH CALL BACK NUMBER REASON FOR CALL**this is important as we prioritize the call backs  YOU WILL RECEIVE A CALL BACK THE SAME DAY AS LONG AS YOU CALL BEFORE 4:00 PM  At the [facility], you and your health needs are our priority. As part of our continuing mission to provide you with exceptional heart care, we have created designated Provider Care Teams. These Care Teams include your primary Cardiologist (physician) and Advanced Practice Providers (APPs- Physician Assistants and Nurse Practitioners) who all work together to provide you with the care you need, when you need it.   You may see any of the following providers on your designated Care Team at your next follow up: Dr Toribio Fuel Dr Ezra Shuck Dr. Ria Commander Dr. Morene Brownie Amy Lenetta, NP Caffie Shed, [place] [facility], [place] Beckey Coe, NP Jordan Lee, NP Tinnie Redman, PharmD   Please be sure to bring in all your medications bottles to every appointment.    Thank you for choosing [facility]

## 2023-07-14 ENCOUNTER — Ambulatory Visit (INDEPENDENT_AMBULATORY_CARE_PROVIDER_SITE_OTHER): Payer: BLUE CROSS/BLUE SHIELD

## 2023-07-14 DIAGNOSIS — I5022 Chronic systolic (congestive) heart failure: Secondary | ICD-10-CM

## 2023-07-14 DIAGNOSIS — I428 Other cardiomyopathies: Secondary | ICD-10-CM

## 2023-07-14 LAB — CUP PACEART REMOTE DEVICE CHECK
Battery Remaining Percentage: 71 %
Date Time Interrogation Session: 20250112215800
HighPow Impedance: 75 Ohm
Implantable Lead Connection Status: 753985
Implantable Lead Implant Date: 20220729
Implantable Lead Location: 753862
Implantable Lead Model: 3501
Implantable Lead Serial Number: 213685
Implantable Pulse Generator Implant Date: 20220729
Pulse Gen Serial Number: 163995

## 2023-07-23 NOTE — Progress Notes (Deleted)
Cardiology Office Note:  .   Date:  07/23/2023  ID:  Kyle Pearson, DOB 1975-10-10, MRN [id number] PCP: Patient, No Pcp Per  [facility] Providers Cardiologist:  None Electrophysiologist:  Will Jorja Loa, MD {  History of Present Illness: .   Kyle Pearson is a 48 y.o. male w/PMHx of NICM, chronic CHF (2021 had c.MRI w/severely reduced EF and non-coronary pattern extensive scar), ICD,  Neurocystircercosis   He saw Dr. Ladona Ridgel  (unscheduled/work-in) for ICD shocks.  He had run out of his meds (3 days), started to feel SOB, lightheaded and then shocked x2 Tachycardia was >210bpm, somewhat irregular and perhaps 1:1 AFlutter Advised to resume his medications No changes were made.  He saw HF team 07/04/23, doing well, atypical CP, some bloating attributed to diet.  Planned for an updated echo EKG was SR   Today's visit is scheduled as a scheduled f/u  ROS:   *** VT? *** symptoms, syncope *** flutter? Does he need OAC?  Plan 30 day Monitor? *** taking meds? *** any AF on his icd?  Device information BSci S-ICD implanted 01/26/2021  Arrhythmia/AAD hx Nov 2024  ICD shock for VT vs 1:1 flutter in setting of missed meds for 3 days   Studies Reviewed: Marland Kitchen    EKG not done today  DEVICE interrogation done today and reviewed by myself *** Battery and lead measurements are good ***  09/27/2020: TTE 1. Left ventricular ejection fraction, by estimation, is <20%. The left  ventricle has severely decreased function. The left ventricle demonstrates  global hypokinesis. The left ventricular internal cavity size was  moderately dilated. Left ventricular  diastolic parameters are consistent with Grade II diastolic dysfunction  (pseudonormalization).   2. Right ventricular systolic function is moderately reduced. The right  ventricular size is moderately enlarged. There is moderately elevated  pulmonary artery systolic pressure. The estimated right ventricular   systolic pressure is 49.3 mmHg.   3. Left atrial size was moderately dilated.   4. Right atrial size was mildly dilated.   5. The mitral valve is normal in structure. Trivial mitral valve  regurgitation. No evidence of mitral stenosis.   6. The aortic valve is tricuspid. Aortic valve regurgitation is not  visualized. No aortic stenosis is present.   7. The inferior vena cava is dilated in size with <50% respiratory  variability, suggesting right atrial pressure of 15 mmHg.   - Echo (11/21): EF < 20%, severe LV dilation, severe RVE and severely decreased RV systolic function, severe biatrial enlargement.  - Cardiac MRI (11/21): Severely dilated LV with diffuse hypokinesis, EF 17%., mod dilated RV with EF 20%, diffuse mid-wall LGE in septum, inferior wall & inferolateral wall - LHC/RHC (12/21): No significant CAD; mean RA 29, PA 69/32 mean 49, mean PCWP 43, CI 2.1 (Fick) and 1.7 (thermo), PVR 1.22   Risk Assessment/Calculations:    Physical Exam:   VS:  There were no vitals taken for this visit.   Wt Readings from Last 3 Encounters:  07/04/23 (!) 305 lb 9.6 oz (138.6 kg)  05/23/23 (!) 303 lb (137.4 kg)  05/05/23 (!) 316 lb (143.3 kg)    GEN: Well nourished, well developed in no acute distress NECK: No JVD; No carotid bruits CARDIAC: ***RRR, no murmurs, rubs, gallops RESPIRATORY:  *** CTA b/l without rales, wheezing or rhonchi  ABDOMEN: Soft, non-tender, non-distended EXTREMITIES:  No edema; No deformity   ICD site: *** is stable, no thinning, fluctuation, tethering  ASSESSMENT AND PLAN: .  ICD ***  NICM Chronic CHF ***      {Are you ordering a CV Procedure (e.g. stress test, cath, DCCV, TEE, etc)?   Press F2        :[id number]}     Dispo: ***  Signed, Sheilah Pigeon, PA-C

## 2023-07-25 ENCOUNTER — Ambulatory Visit (HOSPITAL_COMMUNITY)
Admission: RE | Admit: 2023-07-25 | Discharge: 2023-07-25 | Disposition: A | Discharge status: Home / Self Care | Payer: No Typology Code available for payment source | Source: Ambulatory Visit | Attending: Cardiology | Admitting: Cardiology

## 2023-07-25 DIAGNOSIS — I5022 Chronic systolic (congestive) heart failure: Secondary | ICD-10-CM | POA: Insufficient documentation

## 2023-07-25 DIAGNOSIS — Z006 Encounter for examination for normal comparison and control in clinical research program: Secondary | ICD-10-CM

## 2023-07-25 LAB — ECHOCARDIOGRAM COMPLETE
Area-P 1/2: 4.54 cm2
Calc EF: 23.2 %
S' Lateral: 6.9 cm
Single Plane A2C EF: 30.2 %
Single Plane A4C EF: 21.1 %

## 2023-07-25 MED ORDER — PERFLUTREN LIPID MICROSPHERE
1.0000 mL | INTRAVENOUS | Status: AC | PRN
  Administered 2023-07-25: 5 mL via INTRAVENOUS

## 2023-07-25 NOTE — Research (Signed)
SITE: 050     Subject # [id number]   Subprotocol: A  Inclusion Criteria  Patients who meet all of the following criteria are eligible for enrollment as study participants:  Yes No  Age > 48 years old X   Eligible to wear Holter Study X    Exclusion Criteria  Patients who meet any of these criteria are not eligible for enrollment as study participants: Yes No  1. Receiving any mechanical (respiratory or circulatory) or renal support therapy at Screening or during Visit #1.  X  2.  Any other conditions that in the opinion of the investigators are likely to prevent compliance with the study protocol or pose a safety concern if the subject participates in the study.  X  3. Poor tolerance, namely susceptible to severe skin allergies from ECG adhesive patch application.  X   Protocol: REV H                                     Residential Zip code [address] (First 3 digits ONLY)                                             [facility] Informed Consent   Subject Name: Kyle Pearson  Subject met inclusion and exclusion criteria.  The informed consent form, study requirements and expectations were reviewed with the subject. Subject had opportunity to read consent and questions and concerns were addressed prior to the signing of the consent form.  The subject verbalized understanding of the trial requirements.  The subject agreed to participate in the [employer] EF ACT trial and signed the informed consent at 08:40 on 24-Jul-2022.  The informed consent was obtained prior to performance of any protocol-specific procedures for the subject.  A copy of the signed informed consent was given to the subject and a copy was placed in the subject's medical record.   Dyanne Iha          Current Outpatient Medications:    bisacodyl (DULCOLAX) 5 MG EC tablet, Take 10 mg by mouth daily as needed for mild constipation., Disp: , Rfl:    digoxin (LANOXIN) 0.125 MG tablet, Take 1 tablet (125 mcg total) by mouth  daily. NEEDS FOLLOW UP APPOINTMENT FOR MORE REFILLS, Disp: 90 tablet, Rfl: 0   FARXIGA 10 MG TABS tablet, Take 1 tablet (10 mg total) by mouth daily before breakfast., Disp: 90 tablet, Rfl: 3   ivabradine (CORLANOR) 5 MG TABS tablet, TAKE 1 TABLET (5 MG TOTAL) BY MOUTH DAILY AT 6 (SIX) AM., Disp: 60 tablet, Rfl: 11   metoprolol succinate (TOPROL-XL) 100 MG 24 hr tablet, TAKE 1 TABLET BY MOUTH EVERY DAY, Disp: 30 tablet, Rfl: 11   potassium chloride SA (KLOR-CON M20) 20 MEQ tablet, Take 2 tablets (40 mEq total) by mouth daily., Disp: 180 tablet, Rfl: 3   sacubitril-valsartan (ENTRESTO) 49-51 MG, Take 1 tablet by mouth 2 (two) times daily., Disp: 60 tablet, Rfl: 11   spironolactone (ALDACTONE) 25 MG tablet, Take 1 tablet (25 mg total) by mouth daily., Disp: 90 tablet, Rfl: 2   thiamine 100 MG tablet, TAKE 1 TABLET BY MOUTH EVERY DAY, Disp: 30 tablet, Rfl: 11   torsemide (DEMADEX) 20 MG tablet, Take 2 tablets (40 mg total) by mouth daily., Disp:  180 tablet, Rfl: 1 No current facility-administered medications for this visit.  Facility-Administered Medications Ordered in Other Visits:    perflutren lipid microspheres (DEFINITY) IV suspension, 1-10 mL, Intravenous, PRN, Laurey Morale, MD, 5 mL at 07/25/23 [phone]

## 2023-07-28 ENCOUNTER — Ambulatory Visit: Payer: No Typology Code available for payment source | Attending: Physician Assistant | Admitting: Physician Assistant

## 2023-07-29 ENCOUNTER — Encounter: Payer: Self-pay | Admitting: Physician Assistant

## 2023-08-27 NOTE — Progress Notes (Signed)
 Remote ICD transmission.

## 2023-09-04 ENCOUNTER — Encounter: Payer: Self-pay | Admitting: Emergency Medicine

## 2023-09-12 NOTE — Addendum Note (Signed)
 Encounter addended by: Howell Rucks, RDCS on: 09/12/2023 3:13 PM  Actions taken: Imaging Exam ended

## 2023-10-08 ENCOUNTER — Ambulatory Visit (HOSPITAL_COMMUNITY)
Admission: RE | Admit: 2023-10-08 | Discharge: 2023-10-08 | Disposition: A | Discharge status: Home / Self Care | Source: Ambulatory Visit | Attending: Family Medicine | Admitting: Family Medicine

## 2023-10-08 ENCOUNTER — Encounter (HOSPITAL_COMMUNITY): Payer: Self-pay

## 2023-10-08 VITALS — BP 118/84 | HR 76 | Ht 66.0 in | Wt 315.0 lb

## 2023-10-08 DIAGNOSIS — F1011 Alcohol abuse, in remission: Secondary | ICD-10-CM

## 2023-10-08 DIAGNOSIS — F1090 Alcohol use, unspecified, uncomplicated: Secondary | ICD-10-CM | POA: Diagnosis not present

## 2023-10-08 DIAGNOSIS — E669 Obesity, unspecified: Secondary | ICD-10-CM | POA: Insufficient documentation

## 2023-10-08 DIAGNOSIS — I5022 Chronic systolic (congestive) heart failure: Secondary | ICD-10-CM | POA: Diagnosis not present

## 2023-10-08 DIAGNOSIS — Z6841 Body Mass Index (BMI) 40.0 and over, adult: Secondary | ICD-10-CM | POA: Diagnosis not present

## 2023-10-08 DIAGNOSIS — I428 Other cardiomyopathies: Secondary | ICD-10-CM | POA: Diagnosis not present

## 2023-10-08 DIAGNOSIS — Z87891 Personal history of nicotine dependence: Secondary | ICD-10-CM | POA: Insufficient documentation

## 2023-10-08 DIAGNOSIS — I509 Heart failure, unspecified: Secondary | ICD-10-CM | POA: Diagnosis present

## 2023-10-08 DIAGNOSIS — G4733 Obstructive sleep apnea (adult) (pediatric): Secondary | ICD-10-CM | POA: Diagnosis not present

## 2023-10-08 LAB — BASIC METABOLIC PANEL WITH GFR
Anion gap: 9 (ref 5–15)
BUN: 8 mg/dL (ref 6–20)
CO2: 27 mmol/L (ref 22–32)
Calcium: 8.7 mg/dL — ABNORMAL LOW (ref 8.9–10.3)
Chloride: 102 mmol/L (ref 98–111)
Creatinine, Ser: 0.69 mg/dL (ref 0.61–1.24)
GFR, Estimated: >60 mL/min (ref >60)
Glucose, Bld: 125 mg/dL — ABNORMAL HIGH (ref 70–99)
Potassium: 4.5 mmol/L (ref 3.5–5.1)
Sodium: 138 mmol/L (ref 135–145)

## 2023-10-08 LAB — HEMOGLOBIN A1C
Hgb A1c MFr Bld: 5.9 % — ABNORMAL HIGH (ref 4.8–5.6)
Mean Plasma Glucose: 122.63 mg/dL

## 2023-10-08 LAB — BRAIN NATRIURETIC PEPTIDE: B Natriuretic Peptide: 181.5 pg/mL — ABNORMAL HIGH (ref 0.0–100.0)

## 2023-10-08 MED ORDER — TORSEMIDE 20 MG PO TABS
60.0000 mg | ORAL_TABLET | Freq: Every day | ORAL | 3 refills | Status: DC
Start: 2023-10-08 — End: 2023-11-11

## 2023-10-08 MED ORDER — POTASSIUM CHLORIDE CRYS ER 20 MEQ PO TBCR: 60.0000 meq | EXTENDED_RELEASE_TABLET | Freq: Every day | ORAL | 3 refills | Status: DC

## 2023-10-08 NOTE — Progress Notes (Signed)
 [facility] Note  PCP: [facility] HF Cardiologist: Dr. Shirlee Latch  HPI: Kyle Pearson is a 48 y.o. with history of tobacco and alcohol use, diagnosed with CHF in 10/21.    Prior to 11/21, no past medical history. He used to smoke less than 1/2 pack per day and drank around a 6 pack of beer on the weekend.  He denies family history of heart disease but reported his father used to drink alcohol heavily but quit as well.  He states in 03/16/2020 he started to develop a cough and didn't want to mix medication with alcohol so he stopped drinking and started to take cough medication.  He noticed one day a very severe pain on his left side and pain with swallowing.  He was diagnosed with peritonsillar abscess and placed on clindamycin (300 mg po QID x 10 days) and decadron.  He reports feeling better throat wise but noticed increase swelling to his abdomen and difficulty breathing.  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  He continued to experience trouble breathing and walking short distances were difficulty.  He went to [facility] on 04/24/2020 for shortness of breath, abdominal distention and left sided chest pain. 2D echo showed EF 15-20% with severe LVH, BNP 562 and troponin 54.  He was started on lisinopril 5 mg, toprol-XL 25 mg PO daily and lasix IV.  He also had an abdominal ultrasound which showed no significant asites but note mild liver cirrhosis with mild gallbladder wall thickening. He was discharged home on 40 mg PO lasix, KCL 20 mEq, lisinopril 5 and toprol-XL 25.     He presented to 1st PCP appt on 05/29/20 and was found to have shortness of breath, bilaterally lower extremity edema and abdominal distention.  Reporting 30lb weight gain despite compliance with medication.  He was sent to [facility] , where his BNP was [facility] BNP 562), CXR showed cardiomegaly only.  Repeat echo revealed  EF < 20%, severe decrease function with global hypokinesis, grade III DD, dilated LA/RA and small pericardial effusion. He was admitted 05/29/20-06/06/20 with marked volume overload. Diuresed with IV lasix and set for cath. Cath showed elevated filling pressures and low output, no significant CAD. Milrinone was added and he continued to diurese on IV. Milrinone later weaned off with stable co-ox. Started on GDMT. Had cMRI with severely reduced EF and non-coronary pattern extensive scar. Due to concern for ectopy from scar, he was discharged with Lifevest.  Diuresed 35 lbs. Discharge weight 255.6 lbs.  Echo 3/22 showed that EF remains < 20%, moderate LVE, moderate RVE, mildly decreased RV systolic function, IVC dilated.  CPX in 4/22 showed only a mild HF limitation.   Admitted 11/2020 with volume overload.   [employer] ICD was placed.   He had an episode of VT vs 1:1 atrial flutter in 11/23, had been out of meds x 3 days at the time.    Echo 1/25 showed EF 20-25%, G3DD, RV mildly reduced  Today he returns for HF follow up. Overall feeling fine. He has SOB walking up steps. Has has abdomen and leg swelling, improves when he takes his torsemide. Works out 5 days/week at gym, does mostly TM and elliptical. Denies palpitations, abnormal bleeding, CP, dizziness, or PND/Orthopnea. Appetite ok. No fever or chills. Weight at home 301 pounds. Taking all medications. Cut back beer to 1  every 3 days, no tobacco/no drugs. He works for a [employer].  ReDs reading: 43 %, abnormal  ReDs reading: 43 %, abnormal  ECG (personally reviewed): none ordered today.  Labs (6/22): Trypanosoma cruzi antibody test negative Labs (3/24): K 3.8, creatinine 0.78 Labs (11/24): K 4.0, creatinine 0.89 Labs (1/25): K 4.0, creatinine 0.69  PMH: 1. Prior smoker. 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  [employer] ICD.  HIV negative.  No strong family history. Prior heavy ETOH  but quit in 9/21.   - Echo (11/21): EF < 20%, severe LV dilation, severe RVE and severely decreased RV systolic function, severe biatrial enlargement.  - Cardiac MRI (11/21): Severely dilated LV with diffuse hypokinesis, EF 17%., mod dilated RV with EF 20%, diffuse mid-wall LGE in septum, inferior wall & inferolateral wall - LHC/RHC (12/21): No significant CAD; mean RA 29, PA 69/32 mean 49, mean PCWP 43, CI 2.1 (Fick) and 1.7 (thermo), PVR 1.22 - Echo (3/22): EF < 20%, moderate LVE, moderate RVE, mildly decreased RV systolic function, IVC dilated.  - CPX (4/22): Peak VO2 20.2, VE/VCO2 slope 33, RER 1.07.  Mild HF limitation.  - Echo (1/25): EF 20-25%, G3DD, RV mildly reduced 4. OSA 5. VT: 3/24, 11/24.    ROS: All systems negative except as listed in HPI, PMH and Problem List.  SH:  Social History   Socioeconomic History   Marital status: Married    Spouse name: Not on file   Number of children: Not on file   Years of education: Not on file   Highest education level: Not on file  Occupational History   Not on file  Tobacco Use   Smoking status: Former    Current packs/day: 0.00    Types: Cigarettes    Quit date: 2020    Years since quitting: 5.2   Smokeless tobacco: Never  Vaping Use   Vaping status: Never Used  Substance and Sexual Activity   Alcohol use: Not Currently   Drug use: Never   Sexual activity: Not on file  Other Topics Concern   Not on file  Social History Narrative   Lives with spouse   Social Drivers of Corporate investment banker Strain: Not on file  Food Insecurity: Not on file  Transportation Needs: Not on file  Physical Activity: Not on file  Stress: Not on file  Social Connections: Not on file  Intimate Partner Violence: Not on file   FH:  Family History  Problem Relation Age of Onset   Diabetes Mother    Stroke Father    Diabetes Sister    Current Outpatient Medications  Medication Sig Dispense Refill   bisacodyl (DULCOLAX) 5 MG EC  tablet Take 10 mg by mouth daily as needed for mild constipation.     digoxin (LANOXIN) 0.125 MG tablet Take 1 tablet (125 mcg total) by mouth daily. NEEDS FOLLOW UP APPOINTMENT FOR MORE REFILLS 90 tablet 0   FARXIGA 10 MG TABS tablet Take 1 tablet (10 mg total) by mouth daily before breakfast. 90 tablet 3   ivabradine (CORLANOR) 5 MG TABS tablet TAKE 1 TABLET (5 MG TOTAL) BY MOUTH DAILY AT 6 (SIX) AM. 60 tablet 11   metoprolol succinate (TOPROL-XL) 100 MG 24 hr tablet TAKE 1 TABLET BY MOUTH EVERY DAY 30 tablet 11   potassium chloride SA (KLOR-CON M20) 20 MEQ tablet Take 2 tablets (40 mEq total) by mouth daily. 180 tablet 3   sacubitril-valsartan (ENTRESTO) 49-51 MG Take  1 tablet by mouth 2 (two) times daily. 60 tablet 11   spironolactone (ALDACTONE) 25 MG tablet Take 1 tablet (25 mg total) by mouth daily. 90 tablet 2   thiamine 100 MG tablet TAKE 1 TABLET BY MOUTH EVERY DAY 30 tablet 11   torsemide (DEMADEX) 20 MG tablet Take 2 tablets (40 mg total) by mouth daily. 180 tablet 1   No current facility-administered medications for this encounter.   BP 118/84   Pulse 76   Ht 5\' 6"  (1.676 m)   Wt (!) 142.9 kg (315 lb)   SpO2 96%   BMI 50.84 kg/m   Wt Readings from Last 3 Encounters:  10/08/23 (!) 142.9 kg (315 lb)  07/04/23 (!) 138.6 kg (305 lb 9.6 oz)  05/23/23 (!) 137.4 kg (303 lb)   PHYSICAL EXAM: General:  NAD. No resp difficulty, walked into clinic HEENT: Normal Neck: Supple. Thick neck, JVP 8-10 Cor: Regular rate & rhythm. No rubs, gallops or murmurs. Lungs: Clear Abdomen: Soft, obese, nontender, nondistended.  Extremities: No cyanosis, clubbing, rash, edema Neuro: Alert & oriented x 3, moves all 4 extremities w/o difficulty. Affect pleasant.  ASSESSMENT & PLAN: 1. Chronic systolic CHF: Nonischemic cardiomyopathy. [employer] ICD.  Symptomatic since 9/21 (after episode of peri-tonsillar abscess).  He drinks ETOH but does not seem to describe heavy enough ETOH to cause  profound cardiomyopathy (though he did have mild cirrhosis on abdominal US at [facility]). No FH of cardiomyopathy.  Born in [place], has been in [place] x many years.  HIV negative 9/21.  Echo 11/21 with EF < 20% with severe LV dilation, moderate RVE with severely decreased systolic function, severe biatrial enlargement, mild MR. RHC/LHC 12/21 showed no coronary disease, markedly elevated filling pressures, and low cardiac output. Cardiac MRI in 11/21 showed severely dilated LV with diffuse hypokinesis, EF 17%, mod dilated RV with EF 20%, diffuse mid-wall LGE in septum, inferior wall & inferolateral wall.  LGE pattern suggests possible prior myocarditis. Echo in 3/22 showed that EF remains < 20% with mild RV dysfunction.  CPX in 4/22 showed only a mild HF limitation.  Trypanosoma cruzi antibodies were negative (had h/o neurocysticercosis), probably not cardiomyopathy related to trypanosomiasis. Echo 1/25 showed EF 20-25%, G3DD, RV mildly reduced. Minimally symptomatic, NYHA class II, as long as he takes his meds. Exam difficult for volume, but he appears at least mildly volume overloaded. Weight is up 10 lbs and REDs elevated at 43% - Increase Lasix to 60 mg daily, increase KCL to 60 daily. BMET/BNP today, repeat BMET In 10-14 days. - Continue Entresto 49/51 mg  bid. - Continue digoxin 0.125 daily. Dig level <0.2 (05/23/23) - Continue Toprol XL 100 mg daily.  - Continue spironolactone 25 mg daily.  - Continue dapagliflozin 10 mg daily.   - Continue ivabradine 5 mg bid  2. 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  However, with obesity could be NASH. He is cutting back his beer intake 3. OSA: Moderate by sleep study 08/2020. AHI 31.1/hr - Needs CPAP. Refer to Dr. Mayford Knife. 4. Obesity: Body mass index is 50.84 kg/m. - Discussed GLP1 today. He is agreeable to trying. Refer to PharmD for GLP1.  Follow up in 3 months with Dr. Park Liter, FNP-BC 10/08/23

## 2023-10-08 NOTE — Patient Instructions (Addendum)
 Increase Torsemide to 60 mg daily - updated Rx sent. Increase potassium to 60 meq daily - updated Rx sent. Labs today - will call you if abnormal. Repeat labs in 10 - 14 days - see below. Referral sent to see Dr. Mayford Knife for help with your CPAP. Referral sent to [facility] for medication for weight loss. Return to see Dr. Shirlee Latch in 3 months. Call us at [phone] in June to schedule this appointment. Please call us at [phone] if any questions or concerns prior to your next appointment.

## 2023-10-08 NOTE — Progress Notes (Signed)
 ReDS Vest / Clip - 10/08/23 0929       ReDS Vest / Clip   Station Marker D    Ruler Value 40    ReDS Value Range High volume overload    ReDS Actual Value 43

## 2023-10-13 ENCOUNTER — Ambulatory Visit (INDEPENDENT_AMBULATORY_CARE_PROVIDER_SITE_OTHER): Payer: BLUE CROSS/BLUE SHIELD

## 2023-10-13 DIAGNOSIS — I428 Other cardiomyopathies: Secondary | ICD-10-CM

## 2023-10-13 DIAGNOSIS — I5022 Chronic systolic (congestive) heart failure: Secondary | ICD-10-CM

## 2023-10-16 LAB — CUP PACEART REMOTE DEVICE CHECK
Battery Remaining Percentage: 68 %
Date Time Interrogation Session: 20250416102100
HighPow Impedance: 65 Ohm
Implantable Lead Connection Status: 753985
Implantable Lead Implant Date: 20220729
Implantable Lead Location: 753862
Implantable Lead Model: 3501
Implantable Lead Serial Number: 213685
Implantable Pulse Generator Implant Date: 20220729
Pulse Gen Serial Number: 163995

## 2023-10-22 ENCOUNTER — Ambulatory Visit (HOSPITAL_COMMUNITY)
Admission: RE | Admit: 2023-10-22 | Discharge: 2023-10-22 | Disposition: A | Discharge status: Home / Self Care | Source: Ambulatory Visit | Attending: Cardiology | Admitting: Cardiology

## 2023-10-22 DIAGNOSIS — I5022 Chronic systolic (congestive) heart failure: Secondary | ICD-10-CM | POA: Diagnosis present

## 2023-10-22 DIAGNOSIS — G4733 Obstructive sleep apnea (adult) (pediatric): Secondary | ICD-10-CM | POA: Diagnosis not present

## 2023-10-22 LAB — BASIC METABOLIC PANEL WITH GFR
Anion gap: 9 (ref 5–15)
BUN: 13 mg/dL (ref 6–20)
CO2: 27 mmol/L (ref 22–32)
Calcium: 8.5 mg/dL — ABNORMAL LOW (ref 8.9–10.3)
Chloride: 101 mmol/L (ref 98–111)
Creatinine, Ser: 0.82 mg/dL (ref 0.61–1.24)
GFR, Estimated: >60 mL/min (ref >60)
Glucose, Bld: 117 mg/dL — ABNORMAL HIGH (ref 70–99)
Potassium: 3.8 mmol/L (ref 3.5–5.1)
Sodium: 137 mmol/L (ref 135–145)

## 2023-10-24 ENCOUNTER — Other Ambulatory Visit (HOSPITAL_COMMUNITY)

## 2023-11-11 ENCOUNTER — Ambulatory Visit: Payer: Self-pay | Admitting: Cardiology

## 2023-11-11 ENCOUNTER — Ambulatory Visit: Attending: Internal Medicine | Admitting: Internal Medicine

## 2023-11-11 ENCOUNTER — Encounter: Payer: Self-pay | Admitting: Internal Medicine

## 2023-11-11 VITALS — BP 112/66 | HR 76 | Ht 66.0 in | Wt 321.0 lb

## 2023-11-11 DIAGNOSIS — G4733 Obstructive sleep apnea (adult) (pediatric): Secondary | ICD-10-CM | POA: Diagnosis not present

## 2023-11-11 DIAGNOSIS — I5022 Chronic systolic (congestive) heart failure: Secondary | ICD-10-CM | POA: Diagnosis not present

## 2023-11-11 LAB — CUP PACEART INCLINIC DEVICE CHECK
Date Time Interrogation Session: 20250513152202
Implantable Lead Connection Status: 753985
Implantable Lead Implant Date: 20220729
Implantable Lead Location: 753862
Implantable Lead Model: 3501
Implantable Lead Serial Number: 213685
Implantable Pulse Generator Implant Date: 20220729
Pulse Gen Serial Number: 163995

## 2023-11-11 MED ORDER — TORSEMIDE 20 MG PO TABS
60.0000 mg | ORAL_TABLET | Freq: Every day | ORAL | 3 refills | Status: DC
Start: 2023-11-11 — End: 2024-02-21

## 2023-11-11 MED ORDER — ENTRESTO 49-51 MG PO TABS: 1.0000 | ORAL_TABLET | Freq: Two times a day (BID) | ORAL | 11 refills | Status: DC

## 2023-11-11 MED ORDER — IVABRADINE HCL 5 MG PO TABS: 5.0000 mg | ORAL_TABLET | Freq: Every day | ORAL | 11 refills | Status: DC

## 2023-11-11 NOTE — Patient Instructions (Signed)

## 2023-11-11 NOTE — Progress Notes (Signed)
 HPI [name] returns today for an uscheduled visit for evaluation of ICD shocks. He is a pleasant 48 yo man with a h/o non-ischemic CM, EF 20% by echo s/p SICD insertion. He received an ICD shock for rapid flutter several months ago. He has occasional dizzy spells as well as sob. His dose of torsemide  was increased.   Allergies  Allergen Reactions   Raspberry Anaphylaxis   Penicillins Rash    Reaction: Childhood     Current Outpatient Medications  Medication Sig Dispense Refill   bisacodyl  (DULCOLAX) 5 MG EC tablet Take 10 mg by mouth daily as needed for mild constipation.     digoxin  (LANOXIN ) 0.125 MG tablet Take 1 tablet (125 mcg total) by mouth daily. NEEDS FOLLOW UP APPOINTMENT FOR MORE REFILLS 90 tablet 0   FARXIGA  10 MG TABS tablet Take 1 tablet (10 mg total) by mouth daily before breakfast. 90 tablet 3   ivabradine  (CORLANOR ) 5 MG TABS tablet TAKE 1 TABLET (5 MG TOTAL) BY MOUTH DAILY AT 6 (SIX) AM. 60 tablet 11   metoprolol  succinate (TOPROL -XL) 100 MG 24 hr tablet TAKE 1 TABLET BY MOUTH EVERY DAY 30 tablet 11   potassium chloride  SA (KLOR-CON  M20) 20 MEQ tablet Take 3 tablets (60 mEq total) by mouth daily. 270 tablet 3   sacubitril -valsartan  (ENTRESTO ) 49-51 MG Take 1 tablet by mouth 2 (two) times daily. 60 tablet 11   spironolactone  (ALDACTONE ) 25 MG tablet Take 1 tablet (25 mg total) by mouth daily. 90 tablet 2   thiamine  100 MG tablet TAKE 1 TABLET BY MOUTH EVERY DAY 30 tablet 11   torsemide  (DEMADEX ) 20 MG tablet Take 3 tablets (60 mg total) by mouth daily. 270 tablet 3   No current facility-administered medications for this visit.     Past Medical History:  Diagnosis Date   CHF (congestive heart failure) (HCC)     ROS:   All systems reviewed and negative except as noted in the HPI.   Past Surgical History:  Procedure Laterality Date   RIGHT HEART CATH N/A 12/08/2020   Procedure: RIGHT HEART CATH;  Surgeon: Darlis Eisenmenger, MD;  Location: [facility] INVASIVE CV  LAB;  Service: Cardiovascular;  Laterality: N/A;   RIGHT/LEFT HEART CATH AND CORONARY ANGIOGRAPHY N/A 06/01/2020   Procedure: RIGHT/LEFT HEART CATH AND CORONARY ANGIOGRAPHY;  Surgeon: Darlis Eisenmenger, MD;  Location: [facility] INVASIVE CV LAB;  Service: Cardiovascular;  Laterality: N/A;   SUBQ ICD IMPLANT N/A 01/26/2021   Procedure: SUBQ ICD IMPLANT;  Surgeon: Lei Pump, MD;  Location: [facility] INVASIVE CV LAB;  Service: Cardiovascular;  Laterality: N/A;     Family History  Problem Relation Age of Onset   Diabetes Mother    Stroke Father    Diabetes Sister      Social History   Socioeconomic History   Marital status: Married    Spouse name: Not on file   Number of children: Not on file   Years of education: Not on file   Highest education level: Not on file  Occupational History   Not on file  Tobacco Use   Smoking status: Former    Current packs/day: 0.00    Types: Cigarettes    Quit date: 2020    Years since quitting: 5.3   Smokeless tobacco: Never  Vaping Use   Vaping status: Never Used  Substance and Sexual Activity   Alcohol use: Not Currently   Drug use: Never   Sexual  activity: Not on file  Other Topics Concern   Not on file  Social History Narrative   Lives with spouse   Social Drivers of Health   Financial Resource Strain: Not on file  Food Insecurity: Not on file  Transportation Needs: Not on file  Physical Activity: Not on file  Stress: Not on file  Social Connections: Not on file  Intimate Partner Violence: Not on file     BP 112/66   Pulse 76   Ht 5\' 6"  (1.676 m)   Wt (!) 321 lb (145.6 kg)   SpO2 97%   BMI 51.81 kg/m   Physical Exam:  Obese but Well appearing NAD HEENT: Unremarkable Neck:  No JVD, no thyromegally Lymphatics:  No adenopathy Back:  No CVA tenderness Lungs:  Clear with no wheezes HEART:  Regular rate rhythm, no murmurs, no rubs, no clicks Abd:  soft, positive bowel sounds, no organomegally, no rebound, no guarding Ext:   2 plus pulses, no edema, no cyanosis, no clubbing Skin:  No rashes no nodules Neuro:  CN II through XII intact, motor grossly intact  DEVICE  Normal device function.  See PaceArt for details.   Assess/Plan:  Probable atrial flutter - he has not had any additional symptoms since his last visit. We will follow. Non-compliance - the importance of not running out of his meds was revieweed.  Chronic systolic heart failure - he is encouraged to maintain a low sodium diet and take his meds. Obesity - weight loss is encouraged.  Pete Brand Vonne Mcdanel,MD

## 2023-11-12 ENCOUNTER — Telehealth: Payer: Self-pay | Admitting: Pharmacy Technician

## 2023-11-12 NOTE — Telephone Encounter (Signed)
 Pharmacy Patient Advocate Encounter   Received notification from CoverMyMeds that prior authorization for ivabradine  is required/requested.   Insurance verification completed.   The patient is insured through [facility] .   Per test claim: PA required; PA submitted to above mentioned insurance via CoverMyMeds Key/confirmation #/EOC [id number] Status is pending

## 2023-11-13 ENCOUNTER — Other Ambulatory Visit (HOSPITAL_COMMUNITY): Payer: Self-pay

## 2023-11-13 NOTE — Telephone Encounter (Signed)
 Hi, the insurance denied the ivabradine  saying the dose is not appropriate for the condition. Per the note in 2023 it said" Stop Corlanor  5 mg BID given patient has not started since being prescribed on 04/18/2022 and HR has been 83-84 bpm at his last two visits." Then on 05/23/2023 it said "Continue ivabradine  5 mg bid => needs refill. " But it was sent in as "ivabradine  5 MG TABS tablet Take 1 tablet (5 mg total) by mouth daily at 6am".  So I submitted the prior authorization as once a day. [facility] sent this prior authorization but per insurance claims he has not gotten this medication since 2022. I tried to call the patient but he did not answer. So my question is, is the ivabradine  supposed to be once a day or twice a day for this patient? If it is supposed to be once a day, I can try to appeal the denial if we know why the dose is appropriate for the condition. Thank you!

## 2023-11-13 NOTE — Telephone Encounter (Signed)
 This is a medication Dr Mitzie Anda normally prescribes for the pt and we sent in after he got a shock from not taking his medications. The dosage would be a question for their office, however it looks like they changed it to once a day on or around 05/23/2023. Hope that helps!

## 2023-12-04 NOTE — Progress Notes (Signed)
 Remote ICD transmission.

## 2023-12-04 NOTE — Addendum Note (Signed)
 Addended by: Edra Govern D on: 12/04/2023 03:12 PM   Modules accepted: Orders

## 2023-12-10 NOTE — Progress Notes (Deleted)
 Pt declined visit.  Would like to lose weight with lifestyle modificaton

## 2023-12-11 ENCOUNTER — Ambulatory Visit: Admitting: Pharmacist Clinician (PhC)/ Clinical Pharmacy Specialist

## 2024-01-19 ENCOUNTER — Ambulatory Visit: Payer: Self-pay

## 2024-01-19 DIAGNOSIS — I5022 Chronic systolic (congestive) heart failure: Secondary | ICD-10-CM | POA: Diagnosis not present

## 2024-01-20 LAB — CUP PACEART REMOTE DEVICE CHECK
Battery Remaining Percentage: 65 %
Date Time Interrogation Session: 20250720145500
HighPow Impedance: 70 Ohm
Implantable Lead Connection Status: 753985
Implantable Lead Implant Date: 20220729
Implantable Lead Location: 753862
Implantable Lead Model: 3501
Implantable Lead Serial Number: 213685
Implantable Pulse Generator Implant Date: 20220729
Pulse Gen Serial Number: 163995

## 2024-01-26 ENCOUNTER — Ambulatory Visit: Payer: Self-pay | Admitting: Cardiology

## 2024-02-21 ENCOUNTER — Telehealth: Payer: Self-pay | Admitting: Physician Assistant

## 2024-02-21 DIAGNOSIS — G4733 Obstructive sleep apnea (adult) (pediatric): Secondary | ICD-10-CM

## 2024-02-21 DIAGNOSIS — I5022 Chronic systolic (congestive) heart failure: Secondary | ICD-10-CM

## 2024-02-21 MED ORDER — FARXIGA 10 MG PO TABS: 10.0000 mg | ORAL_TABLET | Freq: Every day | ORAL | 3 refills | Status: DC

## 2024-02-21 MED ORDER — SACUBITRIL-VALSARTAN 49-51 MG PO TABS: 1.0000 | ORAL_TABLET | Freq: Two times a day (BID) | ORAL | 11 refills | Status: DC

## 2024-02-21 MED ORDER — DIGOXIN 125 MCG PO TABS: 125.0000 ug | ORAL_TABLET | Freq: Every day | ORAL | 0 refills | Status: DC

## 2024-02-21 MED ORDER — IVABRADINE HCL 5 MG PO TABS: 5.0000 mg | ORAL_TABLET | Freq: Every day | ORAL | 11 refills | Status: DC

## 2024-02-21 MED ORDER — SPIRONOLACTONE 25 MG PO TABS: 25.0000 mg | ORAL_TABLET | Freq: Every day | ORAL | 2 refills | Status: DC

## 2024-02-21 MED ORDER — TORSEMIDE 20 MG PO TABS: 60.0000 mg | ORAL_TABLET | Freq: Every day | ORAL | 3 refills | Status: DC

## 2024-02-21 MED ORDER — METOPROLOL SUCCINATE ER 100 MG PO TB24: 100.0000 mg | ORAL_TABLET | Freq: Every day | ORAL | 3 refills | Status: DC

## 2024-02-21 NOTE — Telephone Encounter (Addendum)
 Pt called answering service for medication refill.  No answer. Left message for pt to call back.  Pt called back. He needed Entresto , Farxiga , Ivabradine , Metoprolol  succinate, Spironolactone , Digoxin  and Torsemide  refilled. Rx sent to his pharmacy [facility], [place]    02/21/2024 12:40 PM

## 2024-02-23 ENCOUNTER — Other Ambulatory Visit (HOSPITAL_COMMUNITY): Payer: Self-pay

## 2024-02-23 ENCOUNTER — Telehealth: Payer: Self-pay | Admitting: Pharmacy Technician

## 2024-02-23 NOTE — Telephone Encounter (Signed)
 Hi we received a prior authorization request back in May for ivabradine  and I sent our staff a message about the dosage and what was said was: Radames Corean SAILOR, RN    11/13/23  2:13 PM Note This is a medication Dr Rolan normally prescribes for the pt and we sent in after he got a shock from not taking his medications. The dosage would be a question for their office, however it looks like they changed it to once a day on or around 05/23/2023. Hope that helps!       11/13/23  1:37 PM You routed this conversation to [facility] Triage     11/13/23  1:37 PM Note Hi, the insurance denied the ivabradine  saying the dose is not appropriate for the condition. Per the note in 2023 it said Stop Corlanor  5 mg BID given patient has not started since being prescribed on 04/18/2022 and HR has been 83-84 bpm at his last two visits. Then on 05/23/2023 it said Continue ivabradine  5 mg bid => needs refill.  But it was sent in as ivabradine  5 MG TABS tablet Take 1 tablet (5 mg total) by mouth daily at 6am.  So I submitted the prior authorization as once a day. [facility] sent this prior authorization but per insurance claims he has not gotten this medication since 2022. I tried to call the patient but he did not answer. So my question is, is the ivabradine  supposed to be once a day or twice a day for this patient? If it is supposed to be once a day, I can try to appeal the denial if we know why the dose is appropriate for the condition. Thank you!          Then nothing else came of it. Then [facility] sent this prior authorization request again. I am sending to you guys since our office said it is a question for you guys. Thank you!

## 2024-02-24 ENCOUNTER — Other Ambulatory Visit (HOSPITAL_COMMUNITY): Payer: Self-pay

## 2024-02-24 NOTE — Telephone Encounter (Addendum)
 I called [facility] and told them to cancel the ivabradine 

## 2024-02-24 NOTE — Telephone Encounter (Signed)
 Even though it is documented in the last note from our office 10/08/23 to continue Corlanor  5 mg BID, it does not appear that patient has ever started this medication based on dispense history and other notes. Additionally, his last HR was 76 while taking metoprolol  succinate 100 mg daily. And probable Atrial Flutter has been documented in his chart (EP note 11/11/23) since Corlanor  was first prescribed. Based on this, I would not complete appeal for Corlanor . It appears he does not need it and is not taking it. If further HR reduction is needed, could try to increase his metoprolol . He does not meet criteria for Corlanor  at this time.

## 2024-03-08 ENCOUNTER — Telehealth (HOSPITAL_COMMUNITY): Payer: Self-pay

## 2024-03-08 ENCOUNTER — Encounter (HOSPITAL_COMMUNITY): Payer: Self-pay

## 2024-03-08 ENCOUNTER — Emergency Department (HOSPITAL_COMMUNITY)

## 2024-03-08 ENCOUNTER — Emergency Department (HOSPITAL_COMMUNITY)
Admission: EM | Admit: 2024-03-08 | Discharge: 2024-03-09 | Disposition: A | Discharge status: Home / Self Care | Attending: Emergency Medicine | Admitting: Emergency Medicine

## 2024-03-08 DIAGNOSIS — R0789 Other chest pain: Secondary | ICD-10-CM | POA: Insufficient documentation

## 2024-03-08 DIAGNOSIS — R0609 Other forms of dyspnea: Secondary | ICD-10-CM | POA: Insufficient documentation

## 2024-03-08 DIAGNOSIS — R42 Dizziness and giddiness: Secondary | ICD-10-CM | POA: Diagnosis not present

## 2024-03-08 DIAGNOSIS — R531 Weakness: Secondary | ICD-10-CM | POA: Insufficient documentation

## 2024-03-08 DIAGNOSIS — R110 Nausea: Secondary | ICD-10-CM | POA: Insufficient documentation

## 2024-03-08 DIAGNOSIS — R7989 Other specified abnormal findings of blood chemistry: Secondary | ICD-10-CM | POA: Insufficient documentation

## 2024-03-08 DIAGNOSIS — Z87891 Personal history of nicotine dependence: Secondary | ICD-10-CM | POA: Insufficient documentation

## 2024-03-08 DIAGNOSIS — I509 Heart failure, unspecified: Secondary | ICD-10-CM | POA: Insufficient documentation

## 2024-03-08 LAB — CBC
HCT: 35.3 % — ABNORMAL LOW (ref 39.0–52.0)
Hemoglobin: 11.3 g/dL — ABNORMAL LOW (ref 13.0–17.0)
MCH: 29.2 pg (ref 26.0–34.0)
MCHC: 32 g/dL (ref 30.0–36.0)
MCV: 91.2 fL (ref 80.0–100.0)
Platelets: 287 K/uL (ref 150–400)
RBC: 3.87 MIL/uL — ABNORMAL LOW (ref 4.22–5.81)
RDW: 14.2 % (ref 11.5–15.5)
WBC: 8 K/uL (ref 4.0–10.5)
nRBC: 0 % (ref 0.0–0.2)

## 2024-03-08 LAB — BASIC METABOLIC PANEL WITH GFR
Anion gap: 9 (ref 5–15)
BUN: 10 mg/dL (ref 6–20)
CO2: 26 mmol/L (ref 22–32)
Calcium: 8.7 mg/dL — ABNORMAL LOW (ref 8.9–10.3)
Chloride: 98 mmol/L (ref 98–111)
Creatinine, Ser: 0.85 mg/dL (ref 0.61–1.24)
GFR, Estimated: >60 mL/min (ref >60)
Glucose, Bld: 118 mg/dL — ABNORMAL HIGH (ref 70–99)
Potassium: 4.1 mmol/L (ref 3.5–5.1)
Sodium: 133 mmol/L — ABNORMAL LOW (ref 135–145)

## 2024-03-08 LAB — BRAIN NATRIURETIC PEPTIDE: B Natriuretic Peptide: 137.8 pg/mL — ABNORMAL HIGH (ref 0.0–100.0)

## 2024-03-08 LAB — PROTIME-INR
INR: 1 (ref 0.8–1.2)
Prothrombin Time: 14.2 s (ref 11.4–15.2)

## 2024-03-08 NOTE — ED Provider Notes (Signed)
 [facility]-EM[facility] DEPT [facility] Emergency Department Provider Note MRN:  [id number]  Arrival date & time: 03/09/24     Chief Complaint   Chest pain History of Present Illness   Kyle Pearson is a 48 y.o. year-old male with a history of CHF, cirrhosis presenting to the ED with chief complaint of chest pain.  Several days of dyspnea exertion with intermittent bilateral chest discomfort.  Also feeling lightheaded on occasion, also feeling some nausea but no vomiting.  Denies fever, no cough, no abdominal pain.  Has been without a few of his medications for the past 3 weeks.  Feels like he is a bit fluid up.  Review of Systems  A thorough review of systems was obtained and all systems are negative except as noted in the HPI and PMH.   Patient's Health History    Past Medical History:  Diagnosis Date   CHF (congestive heart failure) [facility])     Past Surgical History:  Procedure Laterality Date   RIGHT HEART CATH N/A 12/08/2020   Procedure: RIGHT HEART CATH;  Surgeon: Rolan Ezra RAMAN, MD;  Location: [facility] INVASIVE CV LAB;  Service: Cardiovascular;  Laterality: N/A;   RIGHT/LEFT HEART CATH AND CORONARY ANGIOGRAPHY N/A 06/01/2020   Procedure: RIGHT/LEFT HEART CATH AND CORONARY ANGIOGRAPHY;  Surgeon: Rolan Ezra RAMAN, MD;  Location: [facility] INVASIVE CV LAB;  Service: Cardiovascular;  Laterality: N/A;   SUBQ ICD IMPLANT N/A 01/26/2021   Procedure: SUBQ ICD IMPLANT;  Surgeon: Inocencio Soyla Lunger, MD;  Location: [facility] INVASIVE CV LAB;  Service: Cardiovascular;  Laterality: N/A;    Family History  Problem Relation Age of Onset   Diabetes Mother    Stroke Father    Diabetes Sister     Social History   Socioeconomic History   Marital status: Married    Spouse name: Not on file   Number of children: Not on file   Years of education: Not on file   Highest education level: Not on file  Occupational History   Not on file  Tobacco Use   Smoking status: Former    Current packs/day: 0.00     Types: Cigarettes    Quit date: 2020    Years since quitting: 5.6   Smokeless tobacco: Never  Vaping Use   Vaping status: Never Used  Substance and Sexual Activity   Alcohol use: Not Currently   Drug use: Never   Sexual activity: Not on file  Other Topics Concern   Not on file  Social History Narrative   Lives with spouse   Social Drivers of Health   Financial Resource Strain: Not on file  Food Insecurity: Not on file  Transportation Needs: Not on file  Physical Activity: Not on file  Stress: Not on file  Social Connections: Not on file  Intimate Partner Violence: Not on file     Physical Exam   Vitals:   03/09/24 0100 03/09/24 0115  BP: 124/69 121/86  Pulse: 73 80  Resp: 20 19  Temp:    SpO2: 100% 99%    CONSTITUTIONAL: Well-appearing, NAD NEURO/PSYCH:  Alert and oriented x 3, no focal deficits EYES:  eyes equal and reactive ENT/NECK:  no LAD, no JVD CARDIO: Regular rate, well-perfused, normal S1 and S2 PULM:  CTAB no wheezing or rhonchi GI/GU:  non-distended, non-tender MSK/SPINE:  No gross deformities, no edema SKIN:  no rash, atraumatic   *Additional and/or pertinent findings included in MDM below  Diagnostic and Interventional Summary    EKG  Interpretation Date/Time:  Monday March 08 2024 17:44:00 EDT Ventricular Rate:  92 PR Interval:  200 QRS Duration:  100 QT Interval:  376 QTC Calculation: 464 R Axis:   38  Text Interpretation: Normal sinus rhythm Low voltage QRS Nonspecific T wave abnormality Prolonged QT Abnormal ECG When compared with ECG of 04-Jul-2023 08:52, PREVIOUS ECG IS PRESENT Confirmed by Theadore Sharper [phone]) on 03/08/2024 11:22:47 PM       Labs Reviewed  BASIC METABOLIC PANEL WITH GFR - Abnormal; Notable for the following components:      Result Value   Sodium 133 (*)    Glucose, Bld 118 (*)    Calcium  8.7 (*)    All other components within normal limits  CBC - Abnormal; Notable for the following components:   RBC 3.87  (*)    Hemoglobin 11.3 (*)    HCT 35.3 (*)    All other components within normal limits  BRAIN NATRIURETIC PEPTIDE - Abnormal; Notable for the following components:   B Natriuretic Peptide 137.8 (*)    All other components within normal limits  HEPATIC FUNCTION PANEL - Abnormal; Notable for the following components:   Albumin 3.3 (*)    Total Bilirubin 1.3 (*)    Indirect Bilirubin 1.1 (*)    All other components within normal limits  DIGOXIN  LEVEL - Abnormal; Notable for the following components:   Digoxin  Level <0.6 (*)    All other components within normal limits  TROPONIN I (HIGH SENSITIVITY) - Abnormal; Notable for the following components:   Troponin I (High Sensitivity) 31 (*)    All other components within normal limits  PROTIME-INR  TROPONIN I (HIGH SENSITIVITY)    DG Chest 2 View  Final Result      Medications - No data to display   Procedures  /  Critical Care Procedures  ED Course and Medical Decision Making  Initial Impression and Ddx Differential diagnosis includes CHF exacerbation, electrolyte disturbance, overdiuresis with AKI, ACS.  No acute distress with normal vitals, assessment, talking in full sentences.  No JVD, legs with minimal swelling.  He has been without his spironolactone , without his ivabradine , without his Entresto .  He has been taking his torsemide , digoxin , metoprolol , Farxiga , and potassium.  Past medical/surgical history that increases complexity of ED encounter:  CHF, cirrhosis  Interpretation of Diagnostics I personally reviewed the EKG and my interpretation is as follows:  SR without significant changes  Labs without significant abnormalities, minimal trop elevation at his baseline, mild BNP elevation.  Patient Reassessment and Ultimate Disposition/Management     No emergent process, discharge with PCP follow up.  Patient management required discussion with the following services or consulting groups:  None  Complexity of Problems  Addressed Acute illness or injury that poses threat of life of bodily function  Additional Data Reviewed and Analyzed Further history obtained from: Prior labs/imaging results  Additional Factors Impacting ED Encounter Risk Consideration of hospitalization  Sharper HERO. Theadore, MD [facility] Emergency Medicine [facility] [email or web address]  Final Clinical Impressions(s) / ED Diagnoses     ICD-10-CM   1. Weakness  R53.1     2. DOE (dyspnea on exertion)  R06.09       ED Discharge Orders          Ordered    spironolactone  (ALDACTONE ) 25 MG tablet  Daily        03/09/24 0127    sacubitril -valsartan  (ENTRESTO ) 49-51 MG  2 times daily  03/09/24 0127    ivabradine  (CORLANOR ) 5 MG TABS tablet  Daily        03/09/24 0127             Discharge Instructions Discussed with and Provided to Patient:     Discharge Instructions      You were evaluated in the Emergency Department and after careful evaluation, we did not find any emergent condition requiring admission or further testing in the hospital.  Your exam/testing today was overall reassuring.  Follow up with your regular doctor to discuss your symptoms.  Please return to the Emergency Department if you experience any worsening of your condition.  Thank you for allowing us  to be a part of your care. s        Theadore Ozell HERO, MD 03/09/24 [phone]

## 2024-03-08 NOTE — ED Notes (Signed)
 Extra urine sent to main lab

## 2024-03-08 NOTE — Progress Notes (Signed)
 Advanced Heart Failure Clinic Note  PCP: [facility] HF Cardiologist: Dr. Rolan  HPI: Kyle Pearson is a 48 y.o. with history of tobacco and alcohol use, diagnosed with CHF in 10/21.    Prior to 11/21, no past medical history. He used to smoke less than 1/2 pack per day and drank around a 6 pack of beer on the weekend.  He denies family history of heart disease but reported his father used to drink alcohol heavily but quit as well.  He states in 03/16/2020 he started to develop a cough and didn't want to mix medication with alcohol so he stopped drinking and started to take cough medication.  He noticed one day a very severe pain on his left side and pain with swallowing.  He was diagnosed with peritonsillar abscess and placed on clindamycin  (300 mg po QID x 10 days) and decadron .  He reports feeling better throat wise but noticed increase swelling to his abdomen and difficulty breathing.  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  He continued to experience trouble breathing and walking short distances were difficulty.  He went to [facility] on 04/24/2020 for shortness of breath, abdominal distention and left sided chest pain. 2D echo showed EF 15-20% with severe LVH, BNP 562 and troponin 54.  He was started on lisinopril  5 mg, toprol -XL 25 mg PO daily and lasix  IV.  He also had an abdominal ultrasound which showed no significant asites but note mild liver cirrhosis with mild gallbladder wall thickening. He was discharged home on 40 mg PO lasix , KCL 20 mEq, lisinopril  5 and toprol -XL 25.     He presented to 1st PCP appt on 05/29/20 and was found to have shortness of breath, bilaterally lower extremity edema and abdominal distention.  Reporting 30lb weight gain despite compliance with medication.  He was sent to [facility] , where his BNP was [facility] BNP 562), CXR showed cardiomegaly only.  Repeat echo revealed  EF < 20%, severe decrease function with global hypokinesis, grade III DD, dilated LA/RA and small pericardial effusion. He was admitted 05/29/20-06/06/20 with marked volume overload. Diuresed with IV lasix  and set for cath. Cath showed elevated filling pressures and low output, no significant CAD. Milrinone  was added and he continued to diurese on IV. Milrinone  later weaned off with stable co-ox. Started on GDMT. Had cMRI with severely reduced EF and non-coronary pattern extensive scar. Due to concern for ectopy from scar, he was discharged with Lifevest.  Diuresed 35 lbs. Discharge weight 255.6 lbs.  Echo 3/22 showed that EF remains < 20%, moderate LVE, moderate RVE, mildly decreased RV systolic function, IVC dilated.  CPX in 4/22 showed only a mild HF limitation.   Admitted 11/2020 with volume overload.   [employer] ICD was placed.   He had an episode of VT vs 1:1 atrial flutter in 11/23, had been out of meds x 3 days at the time.    Echo 1/25 showed EF 20-25%, G3DD, RV mildly reduced.  Seen in ED 03/08/24 with CP. Out of several meds x 3 weeks. ECG and HsTroponin reassuring.  Today he returns for HF follow up. Overall feeling fine. He is not SOB walking on flat ground, he has SOB walking > 10 stairs. Denies palpitations, abnormal bleeding, CP, dizziness, edema, or PND/Orthopnea. Appetite ok. Weight at home 337 pounds. Has been off Entresto , Corlanor  and spiro  x weeks. Looking for work but has leg/back pain when standing. Working out TM (flat incline) x 1 hour, weights 30 minutes, going to gym 4-6 days/week. Drinks 6-pack beer once a week, no tobacco or drug use.  ReDs reading: 32%, abnormal  ECG (personally reviewed from 03/08/24): NSR 92 bpm  Labs (6/22): Trypanosoma cruzi antibody test negative Labs (3/24): K 3.8, creatinine 0.78 Labs (11/24): K 4.0, creatinine 0.89 Labs (1/25): K 4.0, creatinine 0.69 Labs (9/25): K 4.1, creatinine 0.85  PMH: 1. Prior smoker. 2.  Neurocystircercosis 3. Chronic systolic CHF: Nonischemic cardiomyopathy.  [employer] ICD.  HIV negative.  No strong family history. Prior heavy ETOH but quit in 9/21.   - Echo (11/21): EF < 20%, severe LV dilation, severe RVE and severely decreased RV systolic function, severe biatrial enlargement.  - Cardiac MRI (11/21): Severely dilated LV with diffuse hypokinesis, EF 17%., mod dilated RV with EF 20%, diffuse mid-wall LGE in septum, inferior wall & inferolateral wall - LHC/RHC (12/21): No significant CAD; mean RA 29, PA 69/32 mean 49, mean PCWP 43, CI 2.1 (Fick) and 1.7 (thermo), PVR 1.22 - Echo (3/22): EF < 20%, moderate LVE, moderate RVE, mildly decreased RV systolic function, IVC dilated.  - CPX (4/22): Peak VO2 20.2, VE/VCO2 slope 33, RER 1.07.  Mild HF limitation.  - Echo (1/25): EF 20-25%, G3DD, RV mildly reduced 4. OSA 5. VT: 3/24, 11/24.    ROS: All systems negative except as listed in HPI, PMH and Problem List.  SH:  Social History   Socioeconomic History   Marital status: Married    Spouse name: Not on file   Number of children: Not on file   Years of education: Not on file   Highest education level: Not on file  Occupational History   Not on file  Tobacco Use   Smoking status: Former    Current packs/day: 0.00    Types: Cigarettes    Quit date: 2020    Years since quitting: 5.6   Smokeless tobacco: Never  Vaping Use   Vaping status: Never Used  Substance and Sexual Activity   Alcohol use: Not Currently   Drug use: Never   Sexual activity: Not on file  Other Topics Concern   Not on file  Social History Narrative   Lives with spouse   Social Drivers of Corporate investment banker Strain: Not on file  Food Insecurity: Not on file  Transportation Needs: Not on file  Physical Activity: Not on file  Stress: Not on file  Social Connections: Not on file  Intimate Partner Violence: Not on file   FH:  Family History  Problem Relation Age of Onset    Diabetes Mother    Stroke Father    Diabetes Sister    Current Outpatient Medications  Medication Sig Dispense Refill   bisacodyl  (DULCOLAX) 5 MG EC tablet Take 10 mg by mouth daily as needed for mild constipation.     digoxin  (LANOXIN ) 0.125 MG tablet Take 1 tablet (125 mcg total) by mouth daily. NEEDS FOLLOW UP APPOINTMENT FOR MORE REFILLS 90 tablet 0   FARXIGA  10 MG TABS tablet Take 1 tablet (10 mg total) by mouth daily before breakfast. 90 tablet 3   metoprolol  succinate (TOPROL -XL) 100 MG 24 hr tablet Take 1 tablet (100 mg total) by mouth daily. 90 tablet 3   potassium chloride  SA (KLOR-CON  M20) 20 MEQ tablet Take 3 tablets (60 mEq total) by mouth daily. 270 tablet 3  thiamine  100 MG tablet TAKE 1 TABLET BY MOUTH EVERY DAY 30 tablet 11   torsemide  (DEMADEX ) 20 MG tablet Take 3 tablets (60 mg total) by mouth daily. 270 tablet 3   ivabradine  (CORLANOR ) 5 MG TABS tablet Take 1 tablet (5 mg total) by mouth daily at 6 (six) AM. (Patient not taking: Reported on 03/09/2024) 60 tablet 11   sacubitril -valsartan  (ENTRESTO ) 49-51 MG Take 1 tablet by mouth 2 (two) times daily. (Patient not taking: Reported on 03/09/2024) 60 tablet 11   spironolactone  (ALDACTONE ) 25 MG tablet Take 1 tablet (25 mg total) by mouth daily. (Patient not taking: Reported on 03/09/2024) 90 tablet 2   No current facility-administered medications for this encounter.   BP 118/62   Pulse 82   Ht 5' 6 (1.676 m)   Wt (!) 152.1 kg (335 lb 6.4 oz)   SpO2 95%   BMI 54.13 kg/m   Wt Readings from Last 3 Encounters:  03/09/24 (!) 152.1 kg (335 lb 6.4 oz)  03/09/24 (!) 145.6 kg (320 lb 15.8 oz)  11/11/23 (!) 145.6 kg (321 lb)   PHYSICAL EXAM: General:  NAD. No resp difficulty, walked into clinic HEENT: Normal Neck: Supple. No JVD. Thick neck Cor: Regular rate & rhythm. No rubs, gallops or murmurs. Lungs: Clear Abdomen: Soft, obese, nontender, nondistended.  Extremities: No cyanosis, clubbing, rash, 1+ BLE edema Neuro: Alert  & oriented x 3, moves all 4 extremities w/o difficulty. Affect pleasant.  ASSESSMENT & PLAN: 1. Chronic systolic CHF: Nonischemic cardiomyopathy. [employer] ICD.  Symptomatic since 9/21 (after episode of peri-tonsillar abscess).  He drinks ETOH but does not seem to describe heavy enough ETOH to cause profound cardiomyopathy (though he did have mild cirrhosis on abdominal US  at [facility]). No FH of cardiomyopathy.  Born in [place], has been in [place] many years.  HIV negative 9/21.  Echo 11/21 with EF < 20% with severe LV dilation, moderate RVE with severely decreased systolic function, severe biatrial enlargement, mild MR. RHC/LHC 12/21 showed no coronary disease, markedly elevated filling pressures, and low cardiac output. Cardiac MRI in 11/21 showed severely dilated LV with diffuse hypokinesis, EF 17%, mod dilated RV with EF 20%, diffuse mid-wall LGE in septum, inferior wall & inferolateral wall.  LGE pattern suggests possible prior myocarditis. Echo in 3/22 showed that EF remains < 20% with mild RV dysfunction.  CPX in 4/22 showed only a mild HF limitation.  Trypanosoma cruzi antibodies were negative (had h/o neurocysticercosis), probably not cardiomyopathy related to trypanosomiasis. Echo 1/25 showed EF 20-25%, G3DD, RV mildly reduced. NYHA II-early III, symptoms worsen when he stops his medications. Suspect body habitus and lg/back pain contributing to functional class. He is not volume overloaded today, although weight is up, suspect caloric. ReDs 32%. GDMT limited by cost. - Off Entresto  and Corlanor , hold off restarting today. Will check with pharmacy about cost. - Restart spironolactone  25 mg daily. Labs from ED reviewed, repeat BMET in 1 week. - Continue torsemide  60 mg daily + 60 KCL daily. - Continue digoxin  0.125 daily. Dig level 0.6 03/9824. - Continue Toprol  XL 100 mg daily.  - Consider repeat CPX when back on GDMT. 2. ETOH:  He did have early cirrhosis on abdominal US  at [facility].  However, with  obesity could be MASH. He is cutting back his beer intake - Discussed cessation. 3. OSA: Moderate by sleep study 08/2020. AHI 31.1/hr - Needs CPAP. Refer to Pulmonary 4. Obesity: Body mass index is 54.13 kg/m. - Weight trending up.  We have referred him to Pharmacy clinic for GLP1, however has not been able to be contacted for appt. - Address at next visit 5. SDOH: needs PCP, will see if we can arrange new patient appt at [facility]. - He is insured and has transportation. - Farxiga  $45/90 dayEntresto  $45/90 (can use co-pay cards for each)  Follow up in 2-3 weeks with APP. Will try to get back on GDMT.  Harlene HERO Megargel, FNP-BC 03/09/24

## 2024-03-08 NOTE — Telephone Encounter (Addendum)
 Called to confirm/remind patient of their appointment at the Advanced Heart Failure Clinic on 03/09/24.   Appointment:   [] Confirmed  [x] Left mess   [] No answer/No voice mail  [] VM Full/unable to leave message  [] Phone not in service  And to bring in all medications and/or complete list.

## 2024-03-08 NOTE — ED Triage Notes (Signed)
 Patient complains of general weakness with some exertional sob and chest tightness. History of chf and has been out of some meds x 3 weeks has not followed up with heart failure clinic

## 2024-03-08 NOTE — ED Provider Triage Note (Signed)
 Emergency Medicine Provider Triage Evaluation Note  Kyle Pearson , a 48 y.o. male  was evaluated in triage.  Pt complains of generalized fatigue, exertional shortness of breath with chest tightness.  History of CHF.  Review of Systems  Positive: Shortness of breath, chest tightness, generalized fatigue Negative: Nausea, vomiting, fever, chills, cough  Physical Exam  BP 109/60   Pulse 87   Temp 98.6 F (37 C)   Resp 18   SpO2 96%  Gen:   Awake, no distress   Resp:  Normal effort  MSK:   Moves extremities without difficulty  Other:  No pitting edema to bilateral lower extremities  Medical Decision Making  Medically screening exam initiated at 6:06 PM.  Appropriate orders placed.  Kyle Pearson was informed that the remainder of the evaluation will be completed by another provider, this initial triage assessment does not replace that evaluation, and the importance of remaining in the ED until their evaluation is complete.  Labs and imaging ordered   Kyle Pearson 03/08/24 1807

## 2024-03-09 ENCOUNTER — Emergency Department (HOSPITAL_BASED_OUTPATIENT_CLINIC_OR_DEPARTMENT_OTHER)
Admission: RE | Admit: 2024-03-09 | Discharge: 2024-03-09 | Disposition: A | Discharge status: Home / Self Care | Source: Ambulatory Visit | Attending: Family Medicine | Admitting: Family Medicine

## 2024-03-09 ENCOUNTER — Other Ambulatory Visit (HOSPITAL_COMMUNITY): Payer: Self-pay

## 2024-03-09 ENCOUNTER — Encounter (HOSPITAL_COMMUNITY): Payer: Self-pay

## 2024-03-09 ENCOUNTER — Telehealth (HOSPITAL_COMMUNITY): Payer: Self-pay

## 2024-03-09 ENCOUNTER — Other Ambulatory Visit: Payer: Self-pay

## 2024-03-09 VITALS — BP 118/62 | HR 82 | Ht 66.0 in | Wt 335.4 lb

## 2024-03-09 DIAGNOSIS — F1011 Alcohol abuse, in remission: Secondary | ICD-10-CM

## 2024-03-09 DIAGNOSIS — I5022 Chronic systolic (congestive) heart failure: Secondary | ICD-10-CM

## 2024-03-09 DIAGNOSIS — G4733 Obstructive sleep apnea (adult) (pediatric): Secondary | ICD-10-CM

## 2024-03-09 DIAGNOSIS — R0683 Snoring: Secondary | ICD-10-CM

## 2024-03-09 LAB — DIGOXIN LEVEL: Digoxin Level: <0.6 ng/mL — ABNORMAL LOW (ref 0.8–2.0)

## 2024-03-09 LAB — HEPATIC FUNCTION PANEL
ALT: 39 U/L (ref 0–44)
AST: 39 U/L (ref 15–41)
Albumin: 3.3 g/dL — ABNORMAL LOW (ref 3.5–5.0)
Alkaline Phosphatase: 70 U/L (ref 38–126)
Bilirubin, Direct: 0.2 mg/dL (ref 0.0–0.2)
Indirect Bilirubin: 1.1 mg/dL — ABNORMAL HIGH (ref 0.3–0.9)
Total Bilirubin: 1.3 mg/dL — ABNORMAL HIGH (ref 0.0–1.2)
Total Protein: 7.4 g/dL (ref 6.5–8.1)

## 2024-03-09 LAB — TROPONIN I (HIGH SENSITIVITY): Troponin I (High Sensitivity): 31 ng/L — ABNORMAL HIGH (ref <18)

## 2024-03-09 MED ORDER — IVABRADINE HCL 5 MG PO TABS: 5.0000 mg | ORAL_TABLET | Freq: Every day | ORAL | 11 refills | Status: DC

## 2024-03-09 MED ORDER — SACUBITRIL-VALSARTAN 49-51 MG PO TABS: 1.0000 | ORAL_TABLET | Freq: Two times a day (BID) | ORAL | 11 refills | Status: DC

## 2024-03-09 MED ORDER — SPIRONOLACTONE 25 MG PO TABS: 25.0000 mg | ORAL_TABLET | Freq: Every day | ORAL | 2 refills | Status: DC

## 2024-03-09 NOTE — Progress Notes (Signed)
 Medication Samples have been provided to the patient.  Drug name: FARXIGA        Strength: 10MG         Qty: 3 BOXES  LOT: [place]  Exp.Date: 03/30/2026  Dosing instructions: TAKE 1 TABLET DAILY BEFORE BREAKFAST   The patient has been instructed regarding the correct time, dose, and frequency of taking this medication, including desired effects and most common side effects.   Dung Salinger B Gibran Veselka 9:05 AM 03/09/2024

## 2024-03-09 NOTE — ED Notes (Signed)
 Called and placed PT on monitor with CCMD

## 2024-03-09 NOTE — Telephone Encounter (Signed)
 Advanced Heart Failure Patient Advocate Encounter  Test billing for this patient's current coverage ([facility]) returns a $45 copay for 90 day supply of Entresto  (DAW 1) and Farxiga .  Ivabradine  requires prior authorization.  This test claim was processed through [facility]- copay amounts may vary at other pharmacies due to pharmacy/plan contracts, or as the patient moves through the different stages of their insurance plan.  Rachel DEL, CPhT Rx Patient Advocate Phone: [phone]

## 2024-03-09 NOTE — Patient Instructions (Signed)
 Medication Changes:  RESTART SPIRONOLACTONE  25MG  ONCE DAILY   REMAIN OFF OF ENTRESTO  AND CORLANOR  (IVABRADINE )  Lab Work:  PLEASE RETURN FOR LABS AS SCHEDULED IN 1 WEEK   Referrals:  YOU HAVE BEEN REFERRED TO PULMONOLOGY THEY WILL REACH OUT TO YOU OR CALL TO ARRANGE THIS. PLEASE CALL US  WITH ANY CONCERNS   Special Instructions // Education:  [facility]  [facility] SEPTEMBER 10TH AT 250PM AT THE [place] LOCATION---DR. DECUBA   Follow-Up in: 3 WEEKS AS SCHEDULED WITH APP CLINIC   At the [facility], you and your health needs are our priority. We have a designated team specialized in the treatment of Heart Failure. This Care Team includes your primary Heart Failure Specialized Cardiologist (physician), Advanced Practice Providers (APPs- Physician Assistants and Nurse Practitioners), and Pharmacist who all work together to provide you with the care you need, when you need it.   You may see any of the following providers on your designated Care Team at your next follow up:  Dr. Toribio Fuel Dr. Ezra Shuck Dr. Ria Commander Dr. Odis Brownie Greig Mosses, NP Caffie Shed, [place] [facility] [place], [place] Beckey Coe, NP [place] Lee, NP Tinnie Redman, PharmD   Please be sure to bring in all your medications bottles to every appointment.   Need to Contact Us :  If you have any questions or concerns before your next appointment please send us  a message through [place] or call our office at [phone].    TO LEAVE A MESSAGE FOR THE NURSE SELECT OPTION 2, PLEASE LEAVE A MESSAGE INCLUDING: YOUR NAME DATE OF BIRTH CALL BACK NUMBER REASON FOR CALL**this is important as we prioritize the call backs  YOU WILL RECEIVE A CALL BACK THE SAME DAY AS LONG AS YOU CALL BEFORE 4:00 PM

## 2024-03-09 NOTE — Discharge Instructions (Signed)
 You were evaluated in the Emergency Department and after careful evaluation, we did not find any emergent condition requiring admission or further testing in the hospital.  Your exam/testing today was overall reassuring.  Follow up with your regular doctor to discuss your symptoms.  Please return to the Emergency Department if you experience any worsening of your condition.  Thank you for allowing us  to be a part of your care. s

## 2024-03-09 NOTE — Progress Notes (Signed)
 ReDS Vest / Clip - 03/09/24 0827       ReDS Vest / Clip   Station Marker D    Ruler Value 42    ReDS Value Range Low volume    ReDS Actual Value 32

## 2024-03-10 ENCOUNTER — Ambulatory Visit (HOSPITAL_BASED_OUTPATIENT_CLINIC_OR_DEPARTMENT_OTHER): Admitting: Family Medicine

## 2024-03-16 ENCOUNTER — Other Ambulatory Visit (HOSPITAL_COMMUNITY)

## 2024-03-26 NOTE — Progress Notes (Incomplete)
 Advanced Heart Failure Clinic Note  PCP: [facility] HF Cardiologist: Dr. Rolan  HPI: Kyle Pearson is a 48 y.o. with history of tobacco and alcohol use, diagnosed with CHF in 10/21.    Prior to 11/21, no past medical history. He used to smoke less than 1/2 pack per day and drank around a 6 pack of beer on the weekend.  He denies family history of heart disease but reported his father used to drink alcohol heavily but quit as well.  He states in 03/16/2020 he started to develop a cough and didn't want to mix medication with alcohol so he stopped drinking and started to take cough medication.  He noticed one day a very severe pain on his left side and pain with swallowing.  He was diagnosed with peritonsillar abscess and placed on clindamycin  (300 mg po QID x 10 days) and decadron .  He reports feeling better throat wise but noticed increase swelling to his abdomen and difficulty breathing.  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  He continued to experience trouble breathing and walking short distances were difficulty.  He went to [facility] on 04/24/2020 for shortness of breath, abdominal distention and left sided chest pain. 2D echo showed EF 15-20% with severe LVH, BNP 562 and troponin 54.  He was started on lisinopril  5 mg, toprol -XL 25 mg PO daily and lasix  IV.  He also had an abdominal ultrasound which showed no significant asites but note mild liver cirrhosis with mild gallbladder wall thickening. He was discharged home on 40 mg PO lasix , KCL 20 mEq, lisinopril  5 and toprol -XL 25.     He presented to 1st PCP appt on 05/29/20 and was found to have shortness of breath, bilaterally lower extremity edema and abdominal distention.  Reporting 30lb weight gain despite compliance with medication.  He was sent to [facility] , where his BNP was [address] [facility] BNP 562), CXR showed cardiomegaly only.  Repeat echo revealed  EF < 20%, severe decrease function with global hypokinesis, grade III DD, dilated LA/RA and small pericardial effusion. He was admitted 05/29/20-06/06/20 with marked volume overload. Diuresed with IV lasix  and set for cath. Cath showed elevated filling pressures and low output, no significant CAD. Milrinone  was added and he continued to diurese on IV. Milrinone  later weaned off with stable co-ox. Started on GDMT. Had cMRI with severely reduced EF and non-coronary pattern extensive scar. Due to concern for ectopy from scar, he was discharged with Lifevest.  Diuresed 35 lbs. Discharge weight 255.6 lbs.  Echo 3/22 showed that EF remains < 20%, moderate LVE, moderate RVE, mildly decreased RV systolic function, IVC dilated.  CPX in 4/22 showed only a mild HF limitation.   Admitted 11/2020 with volume overload.   [employer] ICD was placed.   He had an episode of VT vs 1:1 atrial flutter in 11/23, had been out of meds x 3 days at the time.    Echo 1/25 showed EF 20-25%, G3DD, RV mildly reduced.  Seen in ED 03/08/24 with CP. Out of several meds x 3 weeks. ECG and HsTroponin reassuring.  Today he returns for HF follow up. Overall feeling fine. He is not SOB walking on flat ground, he has SOB walking > 10 stairs. Denies palpitations, abnormal bleeding, CP, dizziness, edema, or PND/Orthopnea. Appetite ok. Weight at home 337 pounds. Has been off Entresto , Corlanor  and spiro  x weeks. Looking for work but has leg/back pain when standing. Working out TM (flat incline) x 1 hour, weights 30 minutes, going to gym 4-6 days/week. Drinks 6-pack beer once a week, no tobacco or drug use.  ReDs reading: 32%, abnormal  ECG (personally reviewed from 03/08/24): NSR 92 bpm  Labs (6/22): Trypanosoma cruzi antibody test negative Labs (3/24): K 3.8, creatinine 0.78 Labs (11/24): K 4.0, creatinine 0.89 Labs (1/25): K 4.0, creatinine 0.69 Labs (9/25): K 4.1, creatinine 0.85  PMH: 1. Prior smoker. 2.  Neurocystircercosis 3. Chronic systolic CHF: Nonischemic cardiomyopathy.  [employer] ICD.  HIV negative.  No strong family history. Prior heavy ETOH but quit in 9/21.   - Echo (11/21): EF < 20%, severe LV dilation, severe RVE and severely decreased RV systolic function, severe biatrial enlargement.  - Cardiac MRI (11/21): Severely dilated LV with diffuse hypokinesis, EF 17%., mod dilated RV with EF 20%, diffuse mid-wall LGE in septum, inferior wall & inferolateral wall - LHC/RHC (12/21): No significant CAD; mean RA 29, PA 69/32 mean 49, mean PCWP 43, CI 2.1 (Fick) and 1.7 (thermo), PVR 1.22 - Echo (3/22): EF < 20%, moderate LVE, moderate RVE, mildly decreased RV systolic function, IVC dilated.  - CPX (4/22): Peak VO2 20.2, VE/VCO2 slope 33, RER 1.07.  Mild HF limitation.  - Echo (1/25): EF 20-25%, G3DD, RV mildly reduced 4. OSA 5. VT: 3/24, 11/24.    ROS: All systems negative except as listed in HPI, PMH and Problem List.  SH:  Social History   Socioeconomic History   Marital status: Married    Spouse name: Not on file   Number of children: Not on file   Years of education: Not on file   Highest education level: Not on file  Occupational History   Not on file  Tobacco Use   Smoking status: Former    Current packs/day: 0.00    Types: Cigarettes    Quit date: 2020    Years since quitting: 5.7   Smokeless tobacco: Never  Vaping Use   Vaping status: Never Used  Substance and Sexual Activity   Alcohol use: Not Currently   Drug use: Never   Sexual activity: Not on file  Other Topics Concern   Not on file  Social History Narrative   Lives with spouse   Social Drivers of Corporate investment banker Strain: Not on file  Food Insecurity: Not on file  Transportation Needs: Not on file  Physical Activity: Not on file  Stress: Not on file  Social Connections: Not on file  Intimate Partner Violence: Not on file   FH:  Family History  Problem Relation Age of Onset    Diabetes Mother    Stroke Father    Diabetes Sister    Current Outpatient Medications  Medication Sig Dispense Refill   bisacodyl  (DULCOLAX) 5 MG EC tablet Take 10 mg by mouth daily as needed for mild constipation.     digoxin  (LANOXIN ) 0.125 MG tablet Take 1 tablet (125 mcg total) by mouth daily. NEEDS FOLLOW UP APPOINTMENT FOR MORE REFILLS 90 tablet 0   FARXIGA  10 MG TABS tablet Take 1 tablet (10 mg total) by mouth daily before breakfast. 90 tablet 3   metoprolol  succinate (TOPROL -XL) 100 MG 24 hr tablet Take 1 tablet (100 mg total) by mouth daily. 90 tablet 3   potassium chloride  SA (KLOR-CON  M20) 20 MEQ tablet Take 3 tablets (60 mEq total) by mouth daily. 270 tablet 3  spironolactone  (ALDACTONE ) 25 MG tablet Take 1 tablet (25 mg total) by mouth daily. 90 tablet 2   thiamine  100 MG tablet TAKE 1 TABLET BY MOUTH EVERY DAY 30 tablet 11   torsemide  (DEMADEX ) 20 MG tablet Take 3 tablets (60 mg total) by mouth daily. 270 tablet 3   No current facility-administered medications for this visit.   There were no vitals taken for this visit.  Wt Readings from Last 3 Encounters:  03/09/24 (!) 152.1 kg (335 lb 6.4 oz)  03/09/24 (!) 145.6 kg (320 lb 15.8 oz)  11/11/23 (!) 145.6 kg (321 lb)   PHYSICAL EXAM: General:  NAD. No resp difficulty, walked into clinic HEENT: Normal Neck: Supple. No JVD. Thick neck Cor: Regular rate & rhythm. No rubs, gallops or murmurs. Lungs: Clear Abdomen: Soft, obese, nontender, nondistended.  Extremities: No cyanosis, clubbing, rash, 1+ BLE edema Neuro: Alert & oriented x 3, moves all 4 extremities w/o difficulty. Affect pleasant.  ASSESSMENT & PLAN: 1. Chronic systolic CHF: Nonischemic cardiomyopathy. [employer] ICD.  Symptomatic since 9/21 (after episode of peri-tonsillar abscess).  He drinks ETOH but does not seem to describe heavy enough ETOH to cause profound cardiomyopathy (though he did have mild cirrhosis on abdominal US  at [facility]). No FH of  cardiomyopathy.  Born in [place], has been in [place] many years.  HIV negative 9/21.  Echo 11/21 with EF < 20% with severe LV dilation, moderate RVE with severely decreased systolic function, severe biatrial enlargement, mild MR. RHC/LHC 12/21 showed no coronary disease, markedly elevated filling pressures, and low cardiac output. Cardiac MRI in 11/21 showed severely dilated LV with diffuse hypokinesis, EF 17%, mod dilated RV with EF 20%, diffuse mid-wall LGE in septum, inferior wall & inferolateral wall.  LGE pattern suggests possible prior myocarditis. Echo in 3/22 showed that EF remains < 20% with mild RV dysfunction.  CPX in 4/22 showed only a mild HF limitation.  Trypanosoma cruzi antibodies were negative (had h/o neurocysticercosis), probably not cardiomyopathy related to trypanosomiasis. Echo 1/25 showed EF 20-25%, G3DD, RV mildly reduced. NYHA II-early III, symptoms worsen when he stops his medications. Suspect body habitus and lg/back pain contributing to functional class. He is not volume overloaded today, although weight is up, suspect caloric. ReDs 32%. GDMT limited by cost. - Off Entresto  and Corlanor , hold off restarting today. Will check with pharmacy about cost. - Restart spironolactone  25 mg daily. Labs from ED reviewed, repeat BMET in 1 week. - Continue torsemide  60 mg daily + 60 KCL daily. - Continue digoxin  0.125 daily. Dig level 0.6 03/9824. - Continue Toprol  XL 100 mg daily.  - Consider repeat CPX when back on GDMT. 2. ETOH:  He did have early cirrhosis on abdominal US  at [facility]  However, with obesity could be MASH. He is cutting back his beer intake - Discussed cessation. 3. OSA: Moderate by sleep study 08/2020. AHI 31.1/hr - Needs CPAP. Refer to Pulmonary 4. Obesity: There is no height or weight on file to calculate BMI. - Weight trending up. We have referred him to Pharmacy clinic for GLP1, however has not been able to be contacted for appt. - Address at next visit 5. SDOH: needs PCP,  will see if we can arrange [facility] app[facility]. - He is insured and has transportation. - Farxiga  $45/90 dayEntresto  $45/90 (can use co-pay cards for each)  Follow up in 2-3 weeks with APP. Will try to get back on GDMT.  Harlene CHRISTELLA Gainer,  FNP-BC 03/26/24

## 2024-03-29 ENCOUNTER — Telehealth (HOSPITAL_COMMUNITY): Payer: Self-pay

## 2024-03-29 NOTE — Telephone Encounter (Signed)
 Called to confirm/remind patient of their appointment at the Advanced [facility] Failure Clinic on 03/30/24.   Appointment:   [] Confirmed  [x] Left mess   [] No answer/No voice mail  [] VM Full/unable to leave message  [] Phone not in service  And to bring in all medications and/or complete list.

## 2024-03-30 ENCOUNTER — Encounter (HOSPITAL_COMMUNITY)

## 2024-04-05 NOTE — Progress Notes (Signed)
 Remote ICD Transmission

## 2024-04-12 ENCOUNTER — Telehealth: Payer: Self-pay

## 2024-04-12 NOTE — Telephone Encounter (Signed)
 Alert received from [employer] for Measured AF of at least 6.0 hours in a 24 hour period. No hx of AF per EPIC.  4 logged events 10/1, 10/4, 10/10, and 10/11, longest duration 6.2hrs.  EGM's c/w irregular R-R.  EGM's reviewed and I am not 100% certain p waves are absent. Compared p waves with previous and appear small/similar. Also see EGM's that appear possible AF. Routing to Dr. Inocencio to review to advise continue to monitor of AF clinic referral recommended.

## 2024-04-15 ENCOUNTER — Ambulatory Visit

## 2024-04-15 NOTE — Progress Notes (Deleted)
 New Patient Pulmonology Office Visit   Subjective:  Patient ID: Kyle Pearson, male    DOB: Dec 13, 1975  MRN: [id number]  Referred by: Glena Harlene HERO, FNP  CC: No chief complaint on file.   HPI Kyle Pearson is a 48 y.o. male with Systolic congestive heart failure [EF less than 20% with ICD in place], morbid obesity presents for evaluation for snoring.  OSA history: Diagnosed with moderate OSA in 2021.  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  PRIOR TESTS and IMAGING: PSG/HSAT:  PSG 2021: AHI 31, no central apneas.  Desaturation 19 minutes O2 nadir 75.  PLMS 5.  Weight 260 pound.  CPAP titration May 2022: CPAP pressure, 11 with residual AHI of 8.  Well-controlled on CPAP pressure between 12-14.    ECHO: June 2025: EF 20-25%, grade 3 diastolic dysfunction, RV function mildly reduced.  LA and RA moderately dilated.      No data to display          Allergies: Raspberry and Penicillins  Current Outpatient Medications:    bisacodyl  (DULCOLAX) 5 MG EC tablet, Take 10 mg by mouth daily as needed for mild constipation., Disp: , Rfl:    digoxin  (LANOXIN ) 0.125 MG tablet, Take 1 tablet (125 mcg total) by mouth daily. NEEDS FOLLOW UP APPOINTMENT FOR MORE REFILLS, Disp: 90 tablet, Rfl: 0   FARXIGA  10 MG TABS  tablet, Take 1 tablet (10 mg total) by mouth daily before breakfast., Disp: 90 tablet, Rfl: 3   metoprolol  succinate (TOPROL -XL) 100 MG 24 hr tablet, Take 1 tablet (100 mg total) by mouth daily., Disp: 90 tablet, Rfl: 3   potassium chloride  SA (KLOR-CON  M20) 20 MEQ tablet, Take 3 tablets (60 mEq total) by mouth daily., Disp: 270 tablet, Rfl: 3   spironolactone  (ALDACTONE ) 25 MG tablet, Take 1 tablet (25 mg total) by mouth daily., Disp: 90 tablet, Rfl: 2   thiamine  100 MG tablet, TAKE 1 TABLET BY MOUTH EVERY DAY, Disp: 30 tablet, Rfl: 11   torsemide  (DEMADEX ) 20 MG tablet, Take 3 tablets (60 mg total) by mouth daily., Disp: 270 tablet, Rfl: 3 Past Medical History:  Diagnosis Date   CHF (congestive heart failure) (HCC)    Past Surgical History:  Procedure Laterality Date   RIGHT HEART CATH N/A 12/08/2020   Procedure: RIGHT HEART CATH;  Surgeon: Rolan Ezra RAMAN, MD;  Location: [facility] INVASIVE CV LAB;  Service: Cardiovascular;  Laterality: N/A;   RIGHT/LEFT HEART CATH AND CORONARY ANGIOGRAPHY N/A 06/01/2020   Procedure: RIGHT/LEFT HEART CATH AND CORONARY ANGIOGRAPHY;  Surgeon: Rolan Ezra RAMAN, MD;  Location: [facility] INVASIVE CV LAB;  Service: Cardiovascular;  Laterality: N/A;   SUBQ ICD IMPLANT N/A 01/26/2021   Procedure: SUBQ ICD IMPLANT;  Surgeon: Inocencio Soyla Lunger, MD;  Location: [facility] INVASIVE CV LAB;  Service: Cardiovascular;  Laterality: N/A;   Family History  Problem Relation Age of Onset   Diabetes Mother    Stroke Father  Diabetes Sister    Social History   Socioeconomic History   Marital status: Married    Spouse name: Not on file   Number of children: Not on file   Years of education: Not on file   Highest education level: Not on file  Occupational History   Not on file  Tobacco Use   Smoking status: Former    Current packs/day: 0.00    Types: Cigarettes    Quit date: 2020    Years since quitting: 5.7   Smokeless tobacco: Never  Vaping Use   Vaping status: Never Used   Substance and Sexual Activity   Alcohol use: Not Currently   Drug use: Never   Sexual activity: Not on file  Other Topics Concern   Not on file  Social History Narrative   Lives with spouse   Social Drivers of Corporate investment banker Strain: Not on file  Food Insecurity: Not on file  Transportation Needs: Not on file  Physical Activity: Not on file  Stress: Not on file  Social Connections: Not on file  Intimate Partner Violence: Not on file       Objective:  There were no vitals taken for this visit. BMI Readings from Last 3 Encounters:  03/09/24 54.13 kg/m  03/09/24 51.81 kg/m  11/11/23 51.81 kg/m    Physical Exam: CONSTITUTIONAL: NAD, well-appearing NASAL/OROPHARYNX:  Normal mucosa. No septal deviation. No hypertrophy of inferior turbinates. Modified Mallampati score ***. Tonsillar grade XXX.  CV: RRR s1s2 nl, no murmurs  RESP: Clear to auscultation, normal respiratory effort   NEURO: CN II/XII grossly intact PSYCH: Alert & oriented x 3, Euthymic, appropriate affect  Diagnostic Review:  {Labs (Optional):32838}       Assessment & Plan:   Assessment & Plan    He/She was counselled about not driving while drowsy which is common side effect of sleep related disorders.   No follow-ups on file.   Time spent: ***  Sammi Fredericks, MD

## 2024-04-19 ENCOUNTER — Ambulatory Visit (INDEPENDENT_AMBULATORY_CARE_PROVIDER_SITE_OTHER): Payer: Self-pay

## 2024-04-19 DIAGNOSIS — I5022 Chronic systolic (congestive) heart failure: Secondary | ICD-10-CM | POA: Diagnosis not present

## 2024-04-20 ENCOUNTER — Ambulatory Visit: Payer: Self-pay

## 2024-04-20 NOTE — Telephone Encounter (Signed)
 FYI Only or Action Required?: FYI only for provider.  Patient was last seen in primary care on N/A, new patient.  Called Nurse Triage reporting Shortness of Breath and Dizziness.  Symptoms began several months ago.  Interventions attempted: Prescription medications: diuretics/heart failure meds.  Symptoms are: intermittent SOB, anxiety, fluid gain, lightheaded, anxiety stable.  Triage Disposition: See Physician Within 24 Hours- Scheduled for new patient appointment and advised patient to call his cardiologist regarding symptom/for follow up  Patient/caregiver understands and will follow disposition?: Yes          Copied from CRM #[id number]. Topic: Clinical - Red Word Triage >> Apr 20, 2024  2:04 PM Charlet HERO wrote: Red Word that prompted transfer to Nurse Triage: Patient is stating that he is having shortness of breath , he went to the hospital a month ago and they told him to get a pcp, calling to get pcp [facility] Reason for Disposition  [1] MILD difficulty breathing (e.g., minimal/no SOB at rest, SOB with walking, pulse < 100) AND [2] new-onset or worse than normal (i.e., patient's baseline)  Answer Assessment - Initial Assessment Questions RN advised patient to call his cardiologist today. Scheduled for new patient appointment with [facility] tomorrow.   1. RESPIRATORY STATUS: Describe your breathing? (e.g., wheezing, shortness of breath, unable to speak, severe coughing)      Shortness of breath.  2. ONSET: When did this breathing problem begin?      He states this has been happening since he was taken off Entresto  and placed on Farxiga . About 3 months.  3. PATTERN Does the difficult breathing come and go, or has it been constant since it started?      Comes and goes. He states when he takes his diuretic it improves.  4. SEVERITY: How bad is your breathing? (e.g., mild, moderate, severe)      Patient able to speak in full sentences, no wheezing/distress/labored breathing  noted during triage. He states he is not having any SOB at this time because he took his diuretic 1-2 hours ago but states he will feel SOB by tomorrow morning when he has to start drinking liquids again.  5. RECURRENT SYMPTOM: Have you had difficulty breathing before? If Yes, ask: When was the last time? and What happened that time?      Yes, he was seen in ED in September.  6. CARDIAC HISTORY: Do you have any history of heart disease? (e.g., heart attack, angina, bypass surgery, angioplasty)      CHF. He states he has been following with cardiology.  7. LUNG HISTORY: Do you have any history of lung disease?  (e.g., pulmonary embolus, asthma, emphysema)     OSA.  8. CAUSE: What do you think is causing the breathing problem?      He states he experiences cramps if he does not drink enough water. If he drinks too much water, he experiences fluid retention which causes SOB/anxiety and lightheadedness.  9. OTHER SYMPTOMS: Do you have any other symptoms? (e.g., chest pain, cough, dizziness, fever, runny nose)     Intermittent fluid retention, intermittent lightheaded, intermittent anxiety, difficulty lying flat. Denies any swelling in his body at this time, he states his ankles will swell intermittently until he takes his diuretic. Denies weight gain, chest pain. He states he has been experiencing an intermittent cough (treats with hot tea and cough drops), notices the cough when the fluid retention is present.  10. O2 SATURATION MONITOR:  Do you use an oxygen  saturation monitor (pulse oximeter) at home? If Yes, ask: What is your reading (oxygen level) today? What is your usual oxygen saturation reading? (e.g., 95%)       No.  11. PREGNANCY: Is there any chance you are pregnant? When was your last menstrual period?       N/A.  12. TRAVEL: Have you traveled out of the country in the last month? (e.g., travel history, exposures)       No.  Protocols used: Breathing  Difficulty-A-AH, Heart Failure on Treatment Follow-up Call-A-AH

## 2024-04-21 ENCOUNTER — Ambulatory Visit (INDEPENDENT_AMBULATORY_CARE_PROVIDER_SITE_OTHER): Admitting: Family Medicine

## 2024-04-21 ENCOUNTER — Encounter: Payer: Self-pay | Admitting: Family Medicine

## 2024-04-21 VITALS — BP 123/82 | HR 110 | Temp 97.9°F | Ht 66.0 in | Wt 333.2 lb

## 2024-04-21 DIAGNOSIS — G4733 Obstructive sleep apnea (adult) (pediatric): Secondary | ICD-10-CM

## 2024-04-21 DIAGNOSIS — I5022 Chronic systolic (congestive) heart failure: Secondary | ICD-10-CM

## 2024-04-21 DIAGNOSIS — Z23 Encounter for immunization: Secondary | ICD-10-CM

## 2024-04-21 DIAGNOSIS — D649 Anemia, unspecified: Secondary | ICD-10-CM | POA: Diagnosis not present

## 2024-04-21 DIAGNOSIS — Z1329 Encounter for screening for other suspected endocrine disorder: Secondary | ICD-10-CM

## 2024-04-21 DIAGNOSIS — Z0000 Encounter for general adult medical examination without abnormal findings: Secondary | ICD-10-CM | POA: Insufficient documentation

## 2024-04-21 DIAGNOSIS — Z0001 Encounter for general adult medical examination with abnormal findings: Secondary | ICD-10-CM

## 2024-04-21 DIAGNOSIS — Z1321 Encounter for screening for nutritional disorder: Secondary | ICD-10-CM

## 2024-04-21 DIAGNOSIS — Z1211 Encounter for screening for malignant neoplasm of colon: Secondary | ICD-10-CM

## 2024-04-21 DIAGNOSIS — K644 Residual hemorrhoidal skin tags: Secondary | ICD-10-CM

## 2024-04-21 DIAGNOSIS — Z1159 Encounter for screening for other viral diseases: Secondary | ICD-10-CM

## 2024-04-21 LAB — CUP PACEART REMOTE DEVICE CHECK
Battery Remaining Percentage: 63 %
Battery Voltage: 63
Date Time Interrogation Session: 20251018103100
HighPow Impedance: 80 Ohm
Implantable Lead Connection Status: 753985
Implantable Lead Implant Date: 20220729
Implantable Lead Location: 753862
Implantable Lead Model: 3501
Implantable Lead Serial Number: 213685
Implantable Pulse Generator Implant Date: 20220729
Pulse Gen Serial Number: 163995

## 2024-04-21 MED ORDER — ZEPBOUND 2.5 MG/0.5ML ~~LOC~~ SOAJ: 2.5000 mg | SUBCUTANEOUS | 0 refills | Status: DC

## 2024-04-21 NOTE — Assessment & Plan Note (Signed)

## 2024-04-21 NOTE — Assessment & Plan Note (Addendum)
 Has referral to pulmonology for CPAP. Would benefit from Zepbound and discussed this option, he is open to trying. Denies history of pancreatitis and personal or family history of MEN2 or MTC Start Zepbound 2.5mg  weekly and follow up in 4 weeks.   Assessment and Plan Assessment & Plan Obstructive sleep apnea Contributing to poor sleep quality and fatigue. No CPAP initiated despite previous sleep study. Weight loss benefits discussed. - Refer to pulmonology for CPAP evaluation. - Start Zepbound for weight management and potential sleep apnea improvement. - Monitor for side effects of Zepbound, including nausea and constipation.

## 2024-04-21 NOTE — Assessment & Plan Note (Signed)
 Followed by cardiology. Continue Digoxin  0.125mg  daily, farxiga  10mg  daily, metoprolol  succinate 100mg  daily, spironolactone  25mg  daily, torsemide  60mg  daily. Continues to experience dyspnea with exertion and what he described as fluid retention. Euvolemic on exam. Recommend daily weights, low sodium diet, medications as prescribed.

## 2024-04-21 NOTE — Patient Instructions (Signed)
 It was great to meet you today and I'm excited to have you join the [facility] practice. I hope you had a positive experience today! If you feel so inclined, please feel free to recommend our practice to friends and family. Jeoffrey Barrio, FNP-C

## 2024-04-21 NOTE — Assessment & Plan Note (Signed)
 Did not want PE today, will refer to GI as he is due for colonoscopy and colon cancer screening.

## 2024-04-21 NOTE — Assessment & Plan Note (Signed)
 CBC today Anemia, unspecified Slight anemia noted. Possible gastrointestinal bleeding considered due to constipation and hemorrhoids. - Order blood work to evaluate anemia. - Refer to gastroenterology for colonoscopy and evaluation of potential gastrointestinal bleeding.

## 2024-04-21 NOTE — Progress Notes (Signed)
 New Patient Office Visit  Subjective    Patient ID: Kyle Pearson, male    DOB: 06-Oct-1975  Age: 48 y.o. MRN: [id number]  CC:  Chief Complaint  Patient presents with   Establish Care    HPI Kyle Pearson presents to establish care. Oriented to practice routines and expectations.  Discussed the use of AI scribe software for clinical note transcription with the patient, who gave verbal consent to proceed.  History of Present Illness Kyle Pearson is a 48 year old male with severe heart failure who presents with medication management and symptoms of fatigue and shortness of breath.  He experiences increased fatigue and shortness of breath, particularly when standing for more than fifteen minutes or walking short distances. Attempts to exercise are hindered by the need to rest frequently due to these symptoms. He attributes these changes to his current medication regimen, expressing concern that recent medication adjustments have made him feel 'more dry.'  His medication regimen includes spironolactone  25 mg daily, torsemide  60 mg daily, potassium 60 mEq, digoxin  0.125 mg, and toprolol 100 mg. He reports that he was previously on sacubitril /valsartan  but that his cardiologist told him to stop it and start Farxiga  10 mg instead. He has a defibrillator implanted, which has shocked him twice in the past due to irregular heart rhythms, causing fear of exercising.  He has a history of sleep apnea, affecting his sleep quality. He sleeps in a recliner and wakes up multiple times during the night. He experiences bloating and fluid retention, feeling as though his body holds onto water, which is then released after taking his diuretic medication. He describes feeling 'dry' after the fluid is released.  He has a history of anemia, noted in recent blood work, and reports a history of alcohol use, which he has since ceased. He denies smoking or drug use. He has a five-year-old child and  mentions trying to stay positive for his family.  He reports gastrointestinal symptoms, including constipation and occasional red-tinged stools, which he attributes to his diet. He suspects he may have hemorrhoids, as he notices blood when wiping and describes a 'little ball' near his rectum that he can push back in. He experiences bloating, particularly after meals, and has been consuming aloe vera drinks to manage acidity.  He has experienced episodes of feeling shaky and anxious, leading to emergency room visits, but reports not receiving clear explanations for these symptoms. He feels that his heart failure limits his ability to work, as he experiences chest pressure and the need to sit down frequently due to fluid retention.     Outpatient Encounter Medications as of 04/21/2024  Medication Sig   bisacodyl  (DULCOLAX) 5 MG EC tablet Take 10 mg by mouth daily as needed for mild constipation.   digoxin  (LANOXIN ) 0.125 MG tablet Take 1 tablet (125 mcg total) by mouth daily. NEEDS FOLLOW UP APPOINTMENT FOR MORE REFILLS   FARXIGA  10 MG TABS tablet Take 1 tablet (10 mg total) by mouth daily before breakfast.   metoprolol  succinate (TOPROL -XL) 100 MG 24 hr tablet Take 1 tablet (100 mg total) by mouth daily.   potassium chloride  SA (KLOR-CON  M20) 20 MEQ tablet Take 3 tablets (60 mEq total) by mouth daily.   spironolactone  (ALDACTONE ) 25 MG tablet Take 1 tablet (25 mg total) by mouth daily.   thiamine  100 MG tablet TAKE 1 TABLET BY MOUTH EVERY DAY   tirzepatide (ZEPBOUND) 2.5 MG/0.5ML Pen Inject 2.5 mg into the skin once  a week.   torsemide  (DEMADEX ) 20 MG tablet Take 3 tablets (60 mg total) by mouth daily.   sacubitril -valsartan  (ENTRESTO ) 49-51 MG Take 1 tablet by mouth 2 (two) times daily. (Patient not taking: Reported on 04/21/2024)   No facility-administered encounter medications on file as of 04/21/2024.    Past Medical History:  Diagnosis Date   CHF (congestive heart failure) [facility])      Past Surgical History:  Procedure Laterality Date   RIGHT HEART CATH N/A 12/08/2020   Procedure: RIGHT HEART CATH;  Surgeon: Rolan Ezra RAMAN, MD;  Location: [name] INVASIVE CV LAB;  Service: Cardiovascular;  Laterality: N/A;   RIGHT/LEFT HEART CATH AND CORONARY ANGIOGRAPHY N/A 06/01/2020   Procedure: RIGHT/LEFT HEART CATH AND CORONARY ANGIOGRAPHY;  Surgeon: Rolan Ezra RAMAN, MD;  Location: [facility] INVASIVE CV LAB;  Service: Cardiovascular;  Laterality: N/A;   SUBQ ICD IMPLANT N/A 01/26/2021   Procedure: SUBQ ICD IMPLANT;  Surgeon: Inocencio Soyla Lunger, MD;  Location: [facility] INVASIVE CV LAB;  Service: Cardiovascular;  Laterality: N/A;    Family History  Problem Relation Age of Onset   Diabetes Mother    Stroke Father    Diabetes Sister     Social History   Socioeconomic History   Marital status: Married    Spouse name: Not on file   Number of children: Not on file   Years of education: Not on file   Highest education level: Not on file  Occupational History   Not on file  Tobacco Use   Smoking status: Former    Current packs/day: 0.00    Types: Cigarettes    Quit date: 2020    Years since quitting: 5.8   Smokeless tobacco: Never  Vaping Use   Vaping status: Never Used  Substance and Sexual Activity   Alcohol use: Not Currently   Drug use: Not Currently   Sexual activity: Not Currently  Other Topics Concern   Not on file  Social History Narrative   Lives with spouse   Social Drivers of Corporate investment banker Strain: Not on file  Food Insecurity: Not on file  Transportation Needs: Not on file  Physical Activity: Not on file  Stress: Not on file  Social Connections: Not on file  Intimate Partner Violence: Not on file    Review of Systems  Constitutional: Negative.   HENT: Negative.    Eyes: Negative.   Respiratory:  Positive for shortness of breath.   Cardiovascular: Negative.   Gastrointestinal:  Positive for blood in stool and constipation.  Genitourinary:  Negative.   Musculoskeletal: Negative.   Skin: Negative.   Neurological: Negative.   Endo/Heme/Allergies: Negative.   Psychiatric/Behavioral: Negative.    All other systems reviewed and are negative.       Objective    BP 123/82   Pulse (!) 110   Temp 97.9 F (36.6 C)   Ht 5' 6 (1.676 m)   Wt (!) 333 lb 3.2 oz (151.1 kg)   SpO2 97%   BMI 53.78 kg/m   Physical Exam Vitals and nursing note reviewed.  Constitutional:      Appearance: Normal appearance. He is obese.  HENT:     Head: Normocephalic and atraumatic.     Right Ear: Tympanic membrane, ear canal and external ear normal.     Left Ear: Tympanic membrane, ear canal and external ear normal.     Nose: Nose normal.     Mouth/Throat:     Mouth: Mucous membranes are  moist.     Pharynx: Oropharynx is clear.  Eyes:     Extraocular Movements: Extraocular movements intact.     Right eye: Normal extraocular motion and no nystagmus.     Left eye: Normal extraocular motion and no nystagmus.     Conjunctiva/sclera: Conjunctivae normal.     Pupils: Pupils are equal, round, and reactive to light.  Cardiovascular:     Rate and Rhythm: Regular rhythm. Tachycardia present.     Pulses: Normal pulses.     Heart sounds: Normal heart sounds.  Pulmonary:     Effort: Pulmonary effort is normal.     Breath sounds: Normal breath sounds.  Abdominal:     General: Bowel sounds are normal.     Palpations: Abdomen is soft.     Tenderness: There is no abdominal tenderness.  Genitourinary:    Comments: Deferred using shared decision making Musculoskeletal:        General: Normal range of motion.     Cervical back: Normal range of motion and neck supple.  Skin:    General: Skin is warm and dry.     Capillary Refill: Capillary refill takes less than 2 seconds.  Neurological:     General: No focal deficit present.     Mental Status: He is alert. Mental status is at baseline.  Psychiatric:        Mood and Affect: Mood normal.         Speech: Speech normal.        Behavior: Behavior normal.        Thought Content: Thought content normal.        Cognition and Memory: Cognition and memory normal.        Judgment: Judgment normal.         Assessment & Plan:   Problem List Items Addressed This Visit     OSA (obstructive sleep apnea)   Has referral to pulmonology for CPAP. Would benefit from Zepbound and discussed this option, he is open to trying. Denies history of pancreatitis and personal or family history of MEN2 or MTC Start Zepbound 2.5mg  weekly and follow up in 4 weeks.   Assessment and Plan Assessment & Plan Obstructive sleep apnea Contributing to poor sleep quality and fatigue. No CPAP initiated despite previous sleep study. Weight loss benefits discussed. - Refer to pulmonology for CPAP evaluation. - Start Zepbound for weight management and potential sleep apnea improvement. - Monitor for side effects of Zepbound, including nausea and constipation.         CHF (congestive heart failure) (HCC)   Followed by cardiology. Continue Digoxin  0.125mg  daily, farxiga  10mg  daily, metoprolol  succinate 100mg  daily, spironolactone  25mg  daily, torsemide  60mg  daily. Continues to experience dyspnea with exertion and what he described as fluid retention. Euvolemic on exam. Recommend daily weights, low sodium diet, medications as prescribed.       Relevant Medications   sacubitril -valsartan  (ENTRESTO ) 49-51 MG   Physical exam, annual - Primary   Today your medical history was reviewed and routine physical exam with labs was performed. Recommend 150 minutes of moderate intensity exercise weekly and consuming a well-balanced diet. Advised to stop smoking if a smoker, avoid smoking if a non-smoker, limit alcohol consumption to 1 drink per day for women and 2 drinks per day for men, and avoid illicit drug use. Counseled in mental health awareness and when to seek medical care. Vaccine maintenance discussed. Appropriate health  maintenance items reviewed. Return to office in 1 year for annual  physical exam.       Relevant Orders   CBC with Differential/Platelet   Comprehensive metabolic panel with GFR   Lipid panel   TSH   Hemoglobin A1c   VITAMIN D 25 Hydroxy (Vit-D Deficiency, Fractures)   Fecal Globin By Immunochemistry   External hemorrhoid   Did not want PE today, will refer to GI as he is due for colonoscopy and colon cancer screening.       Relevant Medications   sacubitril -valsartan  (ENTRESTO ) 49-51 MG   Other Relevant Orders   Ambulatory referral to Gastroenterology   Anemia   CBC today Anemia, unspecified Slight anemia noted. Possible gastrointestinal bleeding considered due to constipation and hemorrhoids. - Order blood work to evaluate anemia. - Refer to gastroenterology for colonoscopy and evaluation of potential gastrointestinal bleeding.      Relevant Orders   CBC with Differential/Platelet   Fecal Globin By Immunochemistry   Ambulatory referral to Gastroenterology   Morbid obesity (HCC)   Obesity Obesity exacerbating heart failure and obstructive sleep apnea. Exercise limited by fear of defibrillator shocks and fluid retention. - Start Zepbound for weight management. - Encourage regular physical activity as tolerated. - Monitor dietary intake, focusing on protein and fiber.       Relevant Medications   tirzepatide (ZEPBOUND) 2.5 MG/0.5ML Pen   Other Relevant Orders   CBC with Differential/Platelet   Comprehensive metabolic panel with GFR   Lipid panel   TSH   Hemoglobin A1c   VITAMIN D 25 Hydroxy (Vit-D Deficiency, Fractures)   Other Visit Diagnoses       Screening for thyroid  disorder       Relevant Orders   TSH     Encounter for vitamin deficiency screening       Relevant Orders   VITAMIN D 25 Hydroxy (Vit-D Deficiency, Fractures)     Colon cancer screening       Relevant Orders   Ambulatory referral to Gastroenterology     Need for hepatitis C screening test        Relevant Orders   Hepatitis C antibody     Need for vaccination       Relevant Orders   Pneumococcal conjugate vaccine 20-valent (Prevnar 20) (Completed)   Tdap vaccine greater than or equal to 7yo IM (Completed)       Return in about 4 weeks (around 05/19/2024) for follow-up.   Jeoffrey GORMAN Barrio, FNP

## 2024-04-21 NOTE — Assessment & Plan Note (Signed)
 Obesity Obesity exacerbating heart failure and obstructive sleep apnea. Exercise limited by fear of defibrillator shocks and fluid retention. - Start Zepbound for weight management. - Encourage regular physical activity as tolerated. - Monitor dietary intake, focusing on protein and fiber.

## 2024-04-23 NOTE — Progress Notes (Signed)
 Remote ICD Transmission

## 2024-04-26 ENCOUNTER — Ambulatory Visit: Payer: Self-pay | Admitting: Family Medicine

## 2024-04-26 ENCOUNTER — Telehealth: Payer: Self-pay | Admitting: Family Medicine

## 2024-04-26 ENCOUNTER — Ambulatory Visit: Payer: Self-pay

## 2024-04-26 ENCOUNTER — Encounter: Payer: Self-pay | Admitting: Family Medicine

## 2024-04-26 ENCOUNTER — Other Ambulatory Visit

## 2024-04-26 DIAGNOSIS — R0602 Shortness of breath: Secondary | ICD-10-CM

## 2024-04-26 NOTE — Telephone Encounter (Signed)
 FYI Only or Action Required?: Action required by provider: request for appointment, clinical question for provider, and update on patient condition.  Patient was last seen in primary care on 04/21/2024 by Kayla Jeoffrey RAMAN, FNP.  Called Nurse Triage reporting Foot Swelling.  Symptoms began a week ago.  Interventions attempted: Prescription medications: Entresto , torsemide , aldactone .  Symptoms are: gradually worsening.  Triage Disposition: See HCP Within 4 Hours (Or PCP Triage)  Patient/caregiver understands and will follow disposition?:   Copied from CRM [id number]. Topic: Clinical - Red Word Triage >> Apr 26, 2024  2:23 PM Ivette P wrote: Red Word that prompted transfer to Nurse Triage: pt just took medication and feet are actively swelling.  Reason for Disposition  [1] Longstanding difficulty breathing (e.g., CHF, COPD, emphysema) AND [2] WORSE than normal  Answer Assessment - Initial Assessment Questions No appt availability in office today with PCP. Called [facility] and spoke with Delon, warm transferred pt to discuss getting scheduled for soonest appt tomorrow. Per note from PCP today, pt instructed to get CXR and schedule f/u appt asap to discuss next steps. Notified pt to get cxr done today, pt agreeable to do so.  1. RESPIRATORY STATUS: Describe your breathing? (e.g., wheezing, shortness of breath, unable to speak, severe coughing)      Severe SOB, coughing  2. ONSET: When did this breathing problem begin?      Ongoing for a while, worse after stopping entresto  on 10/22 per provider instructions.  3. PATTERN Does the difficult breathing come and go, or has it been constant since it started?      Constant, worse with exertion  4. SEVERITY: How bad is your breathing? (e.g., mild, moderate, severe)      Severe SOB  5. RECURRENT SYMPTOM: Have you had difficulty breathing before? If Yes, ask: When was the last time? and What happened that time?      SOB for past  year  6. CARDIAC HISTORY: Do you have any history of heart disease? (e.g., heart attack, angina, bypass surgery, angioplasty)      CHF, implanted defibrillator   7. LUNG HISTORY: Do you have any history of lung disease?  (e.g., pulmonary embolus, asthma, emphysema)     No  8. CAUSE: What do you think is causing the breathing problem?      Stopping entresto  exacerbated breathing problems.   9. OTHER SYMPTOMS: Do you have any other symptoms? (e.g., chest pain, cough, dizziness, fever, runny nose)     Cough, moderate swelling in legs from knees down and in belly, severe anxiety with SOB. CP 3-4/10 that lasts about 10 minutes with exertion, onset 2-3 weeks ago. Resolves with rest.   10. O2 SATURATION MONITOR:  Do you use an oxygen saturation monitor (pulse oximeter) at home? If Yes, ask: What is your reading (oxygen level) today? What is your usual oxygen saturation reading? (e.g., 95%)       No. Does not wear O2.  12. TRAVEL: Have you traveled out of the country in the last month? (e.g., travel history, exposures)       No  Protocols used: Breathing Difficulty-A-AH

## 2024-04-26 NOTE — Telephone Encounter (Signed)
 Called pt to discuss lab resuls Need office visit to discuss. Starting Mounjaro for DM. Decrease Torsemide  from 60mg  to 20mg . Repeat CMP and CBC. Chest x-ray at [facility] imaging. Need UA and fecal occult stool. Needs to schedule with pulmonology for CPAP and GI to workup anemia and blood in stool.

## 2024-04-27 ENCOUNTER — Telehealth: Payer: Self-pay | Admitting: Pharmacy Technician

## 2024-04-27 ENCOUNTER — Encounter: Payer: Self-pay | Admitting: Family Medicine

## 2024-04-27 ENCOUNTER — Ambulatory Visit: Admitting: Family Medicine

## 2024-04-27 ENCOUNTER — Telehealth: Payer: Self-pay | Admitting: Family Medicine

## 2024-04-27 ENCOUNTER — Other Ambulatory Visit (HOSPITAL_COMMUNITY): Payer: Self-pay

## 2024-04-27 VITALS — BP 115/68 | HR 115 | Temp 98.5°F | Ht 66.0 in | Wt 344.4 lb

## 2024-04-27 DIAGNOSIS — I5022 Chronic systolic (congestive) heart failure: Secondary | ICD-10-CM

## 2024-04-27 DIAGNOSIS — D649 Anemia, unspecified: Secondary | ICD-10-CM | POA: Diagnosis not present

## 2024-04-27 DIAGNOSIS — K921 Melena: Secondary | ICD-10-CM | POA: Diagnosis not present

## 2024-04-27 DIAGNOSIS — I4892 Unspecified atrial flutter: Secondary | ICD-10-CM | POA: Insufficient documentation

## 2024-04-27 DIAGNOSIS — G4733 Obstructive sleep apnea (adult) (pediatric): Secondary | ICD-10-CM

## 2024-04-27 DIAGNOSIS — R7989 Other specified abnormal findings of blood chemistry: Secondary | ICD-10-CM | POA: Insufficient documentation

## 2024-04-27 DIAGNOSIS — R0602 Shortness of breath: Secondary | ICD-10-CM | POA: Insufficient documentation

## 2024-04-27 DIAGNOSIS — R748 Abnormal levels of other serum enzymes: Secondary | ICD-10-CM | POA: Insufficient documentation

## 2024-04-27 DIAGNOSIS — E1165 Type 2 diabetes mellitus with hyperglycemia: Secondary | ICD-10-CM | POA: Insufficient documentation

## 2024-04-27 DIAGNOSIS — I2729 Other secondary pulmonary hypertension: Secondary | ICD-10-CM | POA: Insufficient documentation

## 2024-04-27 LAB — URINALYSIS, ROUTINE W REFLEX MICROSCOPIC
Bacteria, UA: NONE SEEN /HPF
Bilirubin Urine: NEGATIVE
Hyaline Cast: NONE SEEN /LPF
Ketones, ur: NEGATIVE
Leukocytes,Ua: NEGATIVE
Nitrite: NEGATIVE
Specific Gravity, Urine: 1.015 (ref 1.001–1.035)
WBC, UA: NONE SEEN /HPF (ref 0–5)
pH: 6.5 (ref 5.0–8.0)

## 2024-04-27 LAB — MICROSCOPIC MESSAGE

## 2024-04-27 MED ORDER — IRON (FERROUS SULFATE) 325 (65 FE) MG PO TABS: 325.0000 mg | ORAL_TABLET | ORAL | 1 refills | Status: DC

## 2024-04-27 MED ORDER — METOPROLOL SUCCINATE ER 100 MG PO TB24: 150.0000 mg | ORAL_TABLET | Freq: Every day | ORAL | 0 refills | Status: DC

## 2024-04-27 MED ORDER — TIRZEPATIDE 2.5 MG/0.5ML ~~LOC~~ SOAJ: 2.5000 mg | SUBCUTANEOUS | 1 refills | Status: DC

## 2024-04-27 MED ORDER — VITAMIN D3 50 MCG (2000 UT) PO CAPS: 2000.0000 [IU] | ORAL_CAPSULE | Freq: Every day | ORAL | 0 refills | Status: AC

## 2024-04-27 MED ORDER — METOPROLOL SUCCINATE ER 100 MG PO TB24: 200.0000 mg | ORAL_TABLET | Freq: Every day | ORAL | 0 refills | Status: DC

## 2024-04-27 NOTE — Assessment & Plan Note (Signed)
 Continue GDMT as prescribed. Worsening SOB and LE edema in setting of atrial flutter. Metoprolol  increased to 150mg  daily. Seeing cardiology next week. Kidney function worsening however will not tolerate decrease in diuretics at this time

## 2024-04-27 NOTE — Telephone Encounter (Signed)
 Pharmacy Patient Advocate Encounter  Received notification from [employer] that Prior Authorization for Mounjaro 2.5MG /0.5ML auto-injectors has been APPROVED from 04/27/24 to 04/27/25. Ran test claim, Copay is $15.00. This test claim was processed through [facility]- copay amounts may vary at other pharmacies due to pharmacy/plan contracts, or as the patient moves through the different stages of their insurance plan.   PA #/Case ID/Reference #: [id number]

## 2024-04-27 NOTE — Assessment & Plan Note (Signed)
 Start ferrous sulfate 325mg  every other day. Stool card ordered. Referral to GI placed and number provided for him to call to schedule. Holding anticoagulation in setting of atrial flutter due to possible GIB.

## 2024-04-27 NOTE — Assessment & Plan Note (Signed)
 New onset aflutter with RVR with PVCs on EKG today. Increase Metoprolol  to 150mg  daily. Montior BP at home. Cannot initiate anticoagulation today due to concerns of GIB and anemia. Has been referred to GI. Cardiology Wilbert Bihari notified of this as she is seeing him next week. Pt advised to keep this appointment. ED precautions discussed.

## 2024-04-27 NOTE — Telephone Encounter (Signed)
 Copied from CRM [id number]. Topic: Clinical - Medical Advice >> Apr 26, 2024  4:29 PM Lonell PEDLAR wrote: Reason for CRM: Patient was scheduled for Xray at [facility] imaging. Insurance is not accepted there. Please schedule patient at a facility that accepts pts insurance

## 2024-04-27 NOTE — Progress Notes (Signed)
 Subjective:  HPI: Kyle Pearson is a 48 y.o. male presenting on 04/27/2024 for Acute Visit (SOB, chest pain , swelling in legs x 2 weeks Jen[facility] labs )   HPI Patient is in today for follow up after his initial visit to establish care. PMH includes CHF, pulmonary venous hypertension, anemia, OSA, VT, prior smoker, Neurocysticercosis, Kyle Pearson continues to endorse worsening dyspnea with exertion and lower extremity edema. Has been referred to Pulmonology for CPAP as well as Gastroenterology for his hematochezia and anemia.   Discussed the use of AI scribe software for clinical note transcription with the patient, who gave verbal consent to proceed.  History of Present Illness Kyle Pearson is a 48 year old male with diabetes and heart failure who presents with shortness of breath and swelling.  He has been experiencing ongoing shortness of breath, such as when walking from his home to the parking lot. He has been taking torsemide , initially at three tablets per day, planned to reduce to one tablet per day due to concerns about kidney function however he endorses worsening lower extremity edema. He reports persistent shortness of breath despite taking diuretics.  He noticed swelling in his feet yesterday, which was not present during his last visit. He has been drinking more water due to concerns about dehydration.   Recent lab results indicated worsening kidney function, low vitamin D levels, elevated white blood cell count, and anemia. He has been prescribed vitamin D and iron supplements. He also reports blood in his stool and has been referred to gastroenterology for further evaluation, stool card provided today for collection.  He was informed of a new diagnosis of diabetes, which was not previously communicated to him. Denies history of pancreatitis and personal or family history of MEN2 or MTC  He is currently taking Entresto , spironolactone , and Farxiga . He expresses concern  that Entresto  may contribute to water retention, but also notes difficulty breathing when not taking it.  Denies chest pain, palpitations, recurrent headaches, vision changes, lightheadedness, dizziness.       Review of Systems  All other systems reviewed and are negative.   Relevant past medical history reviewed and updated as indicated.   Past Medical History:  Diagnosis Date   CHF (congestive heart failure) [facility])      Past Surgical History:  Procedure Laterality Date   RIGHT HEART CATH N/A 12/08/2020   Procedure: RIGHT HEART CATH;  Surgeon: Rolan Ezra RAMAN, MD;  Location: [facility] INVASIVE CV LAB;  Service: Cardiovascular;  Laterality: N/A;   RIGHT/LEFT HEART CATH AND CORONARY ANGIOGRAPHY N/A 06/01/2020   Procedure: RIGHT/LEFT HEART CATH AND CORONARY ANGIOGRAPHY;  Surgeon: Rolan Ezra RAMAN, MD;  Location: [facility] INVASIVE CV LAB;  Service: Cardiovascular;  Laterality: N/A;   SUBQ ICD IMPLANT N/A 01/26/2021   Procedure: SUBQ ICD IMPLANT;  Surgeon: Inocencio Soyla Lunger, MD;  Location: [facility] INVASIVE CV LAB;  Service: Cardiovascular;  Laterality: N/A;    Allergies and medications reviewed and updated.   Current Outpatient Medications:    bisacodyl  (DULCOLAX) 5 MG EC tablet, Take 10 mg by mouth daily as needed for mild constipation., Disp: , Rfl:    Cholecalciferol (VITAMIN D3) 50 MCG (2000 UT) capsule, Take 1 capsule (2,000 Units total) by mouth daily., Disp: 90 capsule, Rfl: 0   digoxin  (LANOXIN ) 0.125 MG tablet, Take 1 tablet (125 mcg total) by mouth daily. NEEDS FOLLOW UP APPOINTMENT FOR MORE REFILLS, Disp: 90 tablet, Rfl: 0   FARXIGA  10 MG TABS tablet, Take 1  tablet (10 mg total) by mouth daily before breakfast., Disp: 90 tablet, Rfl: 3   Iron, Ferrous Sulfate, 325 (65 Fe) MG TABS, Take 325 mg by mouth every other day., Disp: 30 tablet, Rfl: 1   potassium chloride  SA (KLOR-CON  M20) 20 MEQ tablet, Take 3 tablets (60 mEq total) by mouth daily., Disp: 270 tablet, Rfl: 3    sacubitril -valsartan  (ENTRESTO ) 49-51 MG, Take 1 tablet by mouth 2 (two) times daily., Disp: , Rfl:    spironolactone  (ALDACTONE ) 25 MG tablet, Take 1 tablet (25 mg total) by mouth daily., Disp: 90 tablet, Rfl: 2   thiamine  100 MG tablet, TAKE 1 TABLET BY MOUTH EVERY DAY, Disp: 30 tablet, Rfl: 11   tirzepatide (MOUNJARO) 2.5 MG/0.5ML Pen, Inject 2.5 mg into the skin once a week., Disp: 2 mL, Rfl: 1   torsemide  (DEMADEX ) 20 MG tablet, Take 3 tablets (60 mg total) by mouth daily., Disp: 270 tablet, Rfl: 3   metoprolol  succinate (TOPROL -XL) 100 MG 24 hr tablet, Take 1.5 tablets (150 mg total) by mouth daily., Disp: 90 tablet, Rfl: 0  Allergies  Allergen Reactions   Raspberry Anaphylaxis   Penicillins Rash    Reaction: Childhood    Objective:   BP 115/68   Pulse (!) 115   Temp 98.5 F (36.9 C)   Ht 5' 6 (1.676 m)   Wt (!) 344 lb 6.4 oz (156.2 kg)   SpO2 93%   BMI 55.59 kg/m      04/27/2024   12:09 PM 04/21/2024   11:02 AM 03/09/2024    8:27 AM  Vitals with BMI  Height 5' 6 5' 6 5' 6  Weight 344 lbs 6 oz 333 lbs 3 oz 335 lbs 6 oz  BMI 55.61 53.81 54.16  Systolic 115 123 881  Diastolic 68 82 62  Pulse 115 110 82     Physical Exam Vitals and nursing note reviewed.  Constitutional:      Appearance: Normal appearance. He is obese.  HENT:     Head: Normocephalic and atraumatic.  Cardiovascular:     Rate and Rhythm: Normal rate and regular rhythm.     Pulses: Normal pulses.     Heart sounds: Normal heart sounds.  Pulmonary:     Effort: Pulmonary effort is normal.     Breath sounds: Normal breath sounds.  Musculoskeletal:     Right lower leg: 1+ Pitting Edema present.     Left lower leg: 1+ Pitting Edema present.  Skin:    General: Skin is warm and dry.     Capillary Refill: Capillary refill takes less than 2 seconds.  Neurological:     General: No focal deficit present.     Mental Status: He is alert and oriented to person, place, and time. Mental status is at  baseline.  Psychiatric:        Mood and Affect: Mood normal.        Behavior: Behavior normal.        Thought Content: Thought content normal.        Judgment: Judgment normal.     Assessment & Plan:  Chronic systolic congestive heart failure (HCC) Assessment & Plan: Continue GDMT as prescribed. Worsening SOB and LE edema in setting of atrial flutter. Metoprolol  increased to 150mg  daily. Seeing cardiology next week. Kidney function worsening however will not tolerate decrease in diuretics at this time  Orders: -     Comprehensive metabolic panel with GFR; Future  Shortness of breath Assessment &  Plan: CXR ordered. LCTAB. Suspect atrial flutter causing this, increasing metoprolol . Discussed ED precautions.   Orders: -     EKG 12-Lead -     DG Chest 2 View; Future  Hematochezia -     Fecal Globin By Immunochemistry -     CBC with Differential/Platelet; Future  Anemia, unspecified type Assessment & Plan: Start ferrous sulfate 325mg  every other day. Stool card ordered. Referral to GI placed and number provided for him to call to schedule. Holding anticoagulation in setting of atrial flutter due to possible GIB.  Orders: -     CBC with Differential/Platelet; Future  Increase in serum creatinine from prior measurement Assessment & Plan: Avoiding NSAIDs, unable to decrease diuretic at this time due to worsening heart failure.   Orders: -     Urinalysis, Routine w reflex microscopic -     Comprehensive metabolic panel with GFR; Future  Low vitamin D level Assessment & Plan: Start vitamin d 2000 units daily and recheck in 3 months   Type 2 diabetes mellitus with hyperglycemia, without long-term current use of insulin (HCC) Assessment & Plan: New onset. Will need diabetes education however this was not completed at today's visit due to worsening cardiac function and multiple changes in medications for patient to comprehend. I have ordered Mounjaro 2.5mg  weekly to be started.  Will need referral to nutrition and diabetes.   OSA (obstructive sleep apnea) Assessment & Plan: Needs CPAP  Orders: -     Pulmonary Visit  Pulmonary hypertensive venous disease (HCC) Assessment & Plan: Referral to Pulmonology for CPAP and management of pulmonary hypertension.  Orders: -     Pulmonary Visit  Atrial flutter, unspecified type [facility]) Assessment & Plan: New onset aflutter with RVR with PVCs on EKG today. Increase Metoprolol  to 150mg  daily. Montior BP at home. Cannot initiate anticoagulation today due to concerns of GIB and anemia. Has been referred to GI. Cardiology Wilbert Bihari notified of this as she is seeing him next week. Pt advised to keep this appointment. ED precautions discussed.   Other orders -     Iron (Ferrous Sulfate); Take 325 mg by mouth every other day.  Dispense: 30 tablet; Refill: 1 -     Vitamin D3; Take 1 capsule (2,000 Units total) by mouth daily.  Dispense: 90 capsule; Refill: 0 -     Tirzepatide; Inject 2.5 mg into the skin once a week.  Dispense: 2 mL; Refill: 1 -     Microscopic Message -     Metoprolol  Succinate ER; Take 1.5 tablets (150 mg total) by mouth daily.  Dispense: 90 tablet; Refill: 0     Follow up plan: Return in about 4 weeks (around 05/25/2024) for diabetes, follow-up, keep appt with cardiology next week.  Jeoffrey GORMAN Barrio, FNP

## 2024-04-27 NOTE — Assessment & Plan Note (Signed)
 Start vitamin d 2000 units daily and recheck in 3 months

## 2024-04-27 NOTE — Assessment & Plan Note (Signed)
 Referral to Pulmonology for CPAP and management of pulmonary hypertension.

## 2024-04-27 NOTE — Assessment & Plan Note (Signed)
Needs CPAP!!

## 2024-04-27 NOTE — Assessment & Plan Note (Signed)
 CXR ordered. LCTAB. Suspect atrial flutter causing this, increasing metoprolol . Discussed ED precautions.

## 2024-04-27 NOTE — Assessment & Plan Note (Signed)
 Avoiding NSAIDs, unable to decrease diuretic at this time due to worsening heart failure.

## 2024-04-27 NOTE — Telephone Encounter (Signed)
 Pharmacy Patient Advocate Encounter   Received notification from CoverMyMeds that prior authorization for Mounjaro 2.5MG /0.5ML auto-injectors is required/requested.   Insurance verification completed.   The patient is insured through [employer].   Per test claim: PA required; PA started via CoverMyMeds. KEY [id number] . Waiting for clinical questions to populate.

## 2024-04-27 NOTE — Assessment & Plan Note (Signed)
 New onset. Will need diabetes education however this was not completed at today's visit due to worsening cardiac function and multiple changes in medications for patient to comprehend. I have ordered Mounjaro 2.5mg  weekly to be started. Will need referral to nutrition and diabetes.

## 2024-04-28 ENCOUNTER — Ambulatory Visit (HOSPITAL_COMMUNITY)
Admission: RE | Admit: 2024-04-28 | Discharge: 2024-04-28 | Disposition: A | Discharge status: Home / Self Care | Source: Ambulatory Visit | Attending: Family Medicine | Admitting: Family Medicine

## 2024-04-28 ENCOUNTER — Ambulatory Visit: Payer: Self-pay | Admitting: Cardiology

## 2024-04-28 DIAGNOSIS — R0602 Shortness of breath: Secondary | ICD-10-CM | POA: Diagnosis present

## 2024-04-28 LAB — COMPREHENSIVE METABOLIC PANEL WITH GFR
AG Ratio: 1.1 (calc) (ref 1.0–2.5)
ALT: 21 U/L (ref 9–46)
AST: 26 U/L (ref 10–40)
Albumin: 4.1 g/dL (ref 3.6–5.1)
Alkaline phosphatase (APISO): 76 U/L (ref 36–130)
BUN/Creatinine Ratio: 16 (calc) (ref 6–22)
BUN: 23 mg/dL (ref 7–25)
CO2: 31 mmol/L (ref 20–32)
Calcium: 9.3 mg/dL (ref 8.6–10.3)
Chloride: 95 mmol/L — ABNORMAL LOW (ref 98–110)
Creat: 1.44 mg/dL — ABNORMAL HIGH (ref 0.60–1.29)
Globulin: 3.6 g/dL (ref 1.9–3.7)
Glucose, Bld: 119 mg/dL — ABNORMAL HIGH (ref 65–99)
Potassium: 3.6 mmol/L (ref 3.5–5.3)
Sodium: 137 mmol/L (ref 135–146)
Total Bilirubin: 2.6 mg/dL — ABNORMAL HIGH (ref 0.2–1.2)
Total Protein: 7.7 g/dL (ref 6.1–8.1)
eGFR: 60 mL/min/1.73m2 (ref >=60)

## 2024-04-28 LAB — LIPID PANEL
Cholesterol: 119 mg/dL (ref <200)
HDL: 25 mg/dL — ABNORMAL LOW (ref >=40)
LDL Cholesterol (Calc): 73 mg/dL
Non-HDL Cholesterol (Calc): 94 mg/dL (ref <130)
Total CHOL/HDL Ratio: 4.8 (calc) (ref <5.0)
Triglycerides: 125 mg/dL (ref <150)

## 2024-04-28 LAB — TEST AUTHORIZATION

## 2024-04-28 LAB — CBC WITH DIFFERENTIAL/PLATELET
Absolute Lymphocytes: 3472 {cells}/uL (ref 850–3900)
Absolute Monocytes: 1029 {cells}/uL — ABNORMAL HIGH (ref 200–950)
Basophils Absolute: 87 {cells}/uL (ref 0–200)
Basophils Relative: 0.7 %
Eosinophils Absolute: 186 {cells}/uL (ref 15–500)
Eosinophils Relative: 1.5 %
HCT: 33.6 % — ABNORMAL LOW (ref 38.5–50.0)
Hemoglobin: 10.2 g/dL — ABNORMAL LOW (ref 13.2–17.1)
MCH: 25.6 pg — ABNORMAL LOW (ref 27.0–33.0)
MCHC: 30.4 g/dL — ABNORMAL LOW (ref 32.0–36.0)
MCV: 84.4 fL (ref 80.0–100.0)
MPV: 10 fL (ref 7.5–12.5)
Monocytes Relative: 8.3 %
Neutro Abs: 7626 {cells}/uL (ref 1500–7800)
Neutrophils Relative %: 61.5 %
Platelets: 366 Thousand/uL (ref 140–400)
RBC: 3.98 Million/uL — ABNORMAL LOW (ref 4.20–5.80)
RDW: 15.1 % — ABNORMAL HIGH (ref 11.0–15.0)
Total Lymphocyte: 28 %
WBC: 12.4 Thousand/uL — ABNORMAL HIGH (ref 3.8–10.8)

## 2024-04-28 LAB — B12 AND FOLATE PANEL
Folate: 16.8 ng/mL
Vitamin B-12: 563 pg/mL (ref 200–1100)

## 2024-04-28 LAB — IRON,TIBC AND FERRITIN PANEL
%SAT: 7 % — ABNORMAL LOW (ref 20–48)
Ferritin: 14 ng/mL — ABNORMAL LOW (ref 38–380)
Iron: 29 ug/dL — ABNORMAL LOW (ref 50–180)
TIBC: 421 ug/dL (ref 250–425)

## 2024-04-28 LAB — TSH: TSH: 1.85 m[IU]/L (ref 0.40–4.50)

## 2024-04-28 LAB — MICROALBUMIN / CREATININE URINE RATIO
Creatinine, Urine: 114 mg/dL (ref 20–320)
Microalb Creat Ratio: 343 mg/g{creat} — ABNORMAL HIGH (ref <30)
Microalb, Ur: 39.1 mg/dL

## 2024-04-28 LAB — HEMOGLOBIN A1C
Hgb A1c MFr Bld: 6.6 % — ABNORMAL HIGH (ref <5.7)
Mean Plasma Glucose: 143 mg/dL
eAG (mmol/L): 7.9 mmol/L

## 2024-04-28 LAB — VITAMIN D 25 HYDROXY (VIT D DEFICIENCY, FRACTURES): Vit D, 25-Hydroxy: 19 ng/mL — ABNORMAL LOW (ref 30–100)

## 2024-04-28 LAB — HEPATITIS C ANTIBODY: Hepatitis C Ab: NONREACTIVE

## 2024-04-29 ENCOUNTER — Ambulatory Visit: Payer: Self-pay | Admitting: Family Medicine

## 2024-04-29 ENCOUNTER — Encounter: Payer: Self-pay | Admitting: Gastroenterology

## 2024-05-03 ENCOUNTER — Encounter (HOSPITAL_COMMUNITY): Payer: Self-pay | Admitting: *Deleted

## 2024-05-03 ENCOUNTER — Emergency Department (HOSPITAL_COMMUNITY)

## 2024-05-03 ENCOUNTER — Encounter: Payer: Self-pay | Admitting: Gastroenterology

## 2024-05-03 ENCOUNTER — Inpatient Hospital Stay (HOSPITAL_COMMUNITY)
Admission: EM | Admit: 2024-05-03 | Discharge: 2024-05-12 | DRG: 291 | Disposition: A | Discharge status: Home / Self Care | Source: Ambulatory Visit | Attending: Internal Medicine | Admitting: Internal Medicine

## 2024-05-03 ENCOUNTER — Ambulatory Visit: Admitting: Gastroenterology

## 2024-05-03 ENCOUNTER — Inpatient Hospital Stay (HOSPITAL_COMMUNITY)

## 2024-05-03 ENCOUNTER — Other Ambulatory Visit: Payer: Self-pay

## 2024-05-03 VITALS — BP 128/80 | HR 66 | Ht 66.0 in | Wt 345.0 lb

## 2024-05-03 DIAGNOSIS — R0609 Other forms of dyspnea: Secondary | ICD-10-CM

## 2024-05-03 DIAGNOSIS — I499 Cardiac arrhythmia, unspecified: Secondary | ICD-10-CM

## 2024-05-03 DIAGNOSIS — I5022 Chronic systolic (congestive) heart failure: Secondary | ICD-10-CM

## 2024-05-03 DIAGNOSIS — I272 Pulmonary hypertension, unspecified: Secondary | ICD-10-CM | POA: Diagnosis present

## 2024-05-03 DIAGNOSIS — Z95810 Presence of automatic (implantable) cardiac defibrillator: Secondary | ICD-10-CM | POA: Diagnosis not present

## 2024-05-03 DIAGNOSIS — G4733 Obstructive sleep apnea (adult) (pediatric): Secondary | ICD-10-CM | POA: Diagnosis present

## 2024-05-03 DIAGNOSIS — F1010 Alcohol abuse, uncomplicated: Secondary | ICD-10-CM | POA: Diagnosis present

## 2024-05-03 DIAGNOSIS — K5900 Constipation, unspecified: Secondary | ICD-10-CM | POA: Diagnosis present

## 2024-05-03 DIAGNOSIS — Z87891 Personal history of nicotine dependence: Secondary | ICD-10-CM

## 2024-05-03 DIAGNOSIS — E1165 Type 2 diabetes mellitus with hyperglycemia: Secondary | ICD-10-CM

## 2024-05-03 DIAGNOSIS — E871 Hypo-osmolality and hyponatremia: Secondary | ICD-10-CM | POA: Diagnosis present

## 2024-05-03 DIAGNOSIS — K7030 Alcoholic cirrhosis of liver without ascites: Secondary | ICD-10-CM | POA: Diagnosis present

## 2024-05-03 DIAGNOSIS — Z7985 Long-term (current) use of injectable non-insulin antidiabetic drugs: Secondary | ICD-10-CM | POA: Diagnosis not present

## 2024-05-03 DIAGNOSIS — I4892 Unspecified atrial flutter: Secondary | ICD-10-CM | POA: Diagnosis present

## 2024-05-03 DIAGNOSIS — Z823 Family history of stroke: Secondary | ICD-10-CM

## 2024-05-03 DIAGNOSIS — Z833 Family history of diabetes mellitus: Secondary | ICD-10-CM

## 2024-05-03 DIAGNOSIS — Z91199 Patient's noncompliance with other medical treatment and regimen due to unspecified reason: Secondary | ICD-10-CM

## 2024-05-03 DIAGNOSIS — I480 Paroxysmal atrial fibrillation: Secondary | ICD-10-CM | POA: Diagnosis present

## 2024-05-03 DIAGNOSIS — R570 Cardiogenic shock: Secondary | ICD-10-CM | POA: Diagnosis present

## 2024-05-03 DIAGNOSIS — R609 Edema, unspecified: Secondary | ICD-10-CM

## 2024-05-03 DIAGNOSIS — R0602 Shortness of breath: Principal | ICD-10-CM

## 2024-05-03 DIAGNOSIS — N179 Acute kidney failure, unspecified: Secondary | ICD-10-CM | POA: Diagnosis present

## 2024-05-03 DIAGNOSIS — R0601 Orthopnea: Secondary | ICD-10-CM

## 2024-05-03 DIAGNOSIS — E119 Type 2 diabetes mellitus without complications: Secondary | ICD-10-CM | POA: Diagnosis present

## 2024-05-03 DIAGNOSIS — Z6841 Body Mass Index (BMI) 40.0 and over, adult: Secondary | ICD-10-CM | POA: Diagnosis not present

## 2024-05-03 DIAGNOSIS — I509 Heart failure, unspecified: Secondary | ICD-10-CM

## 2024-05-03 DIAGNOSIS — Z7901 Long term (current) use of anticoagulants: Secondary | ICD-10-CM | POA: Diagnosis not present

## 2024-05-03 DIAGNOSIS — D509 Iron deficiency anemia, unspecified: Secondary | ICD-10-CM | POA: Diagnosis present

## 2024-05-03 DIAGNOSIS — I5023 Acute on chronic systolic (congestive) heart failure: Principal | ICD-10-CM | POA: Diagnosis present

## 2024-05-03 DIAGNOSIS — E876 Hypokalemia: Secondary | ICD-10-CM | POA: Diagnosis present

## 2024-05-03 DIAGNOSIS — Z880 Allergy status to penicillin: Secondary | ICD-10-CM

## 2024-05-03 DIAGNOSIS — G253 Myoclonus: Secondary | ICD-10-CM | POA: Diagnosis present

## 2024-05-03 DIAGNOSIS — Z79899 Other long term (current) drug therapy: Secondary | ICD-10-CM | POA: Diagnosis not present

## 2024-05-03 DIAGNOSIS — R0902 Hypoxemia: Secondary | ICD-10-CM

## 2024-05-03 DIAGNOSIS — I428 Other cardiomyopathies: Secondary | ICD-10-CM | POA: Diagnosis present

## 2024-05-03 DIAGNOSIS — K7469 Other cirrhosis of liver: Secondary | ICD-10-CM

## 2024-05-03 DIAGNOSIS — R051 Acute cough: Secondary | ICD-10-CM

## 2024-05-03 DIAGNOSIS — R531 Weakness: Secondary | ICD-10-CM

## 2024-05-03 DIAGNOSIS — I340 Nonrheumatic mitral (valve) insufficiency: Secondary | ICD-10-CM | POA: Diagnosis not present

## 2024-05-03 DIAGNOSIS — I493 Ventricular premature depolarization: Secondary | ICD-10-CM | POA: Diagnosis present

## 2024-05-03 DIAGNOSIS — R42 Dizziness and giddiness: Secondary | ICD-10-CM

## 2024-05-03 LAB — CBC WITH DIFFERENTIAL/PLATELET
Abs Immature Granulocytes: 0.05 K/uL (ref 0.00–0.07)
Basophils Absolute: 0.1 K/uL (ref 0.0–0.1)
Basophils Relative: 1 %
Eosinophils Absolute: 0.1 K/uL (ref 0.0–0.5)
Eosinophils Relative: 1 %
HCT: 31.8 % — ABNORMAL LOW (ref 39.0–52.0)
Hemoglobin: 9.8 g/dL — ABNORMAL LOW (ref 13.0–17.0)
Immature Granulocytes: 0 %
Lymphocytes Relative: 22 %
Lymphs Abs: 2.7 K/uL (ref 0.7–4.0)
MCH: 24.7 pg — ABNORMAL LOW (ref 26.0–34.0)
MCHC: 30.8 g/dL (ref 30.0–36.0)
MCV: 80.3 fL (ref 80.0–100.0)
Monocytes Absolute: 1 K/uL (ref 0.1–1.0)
Monocytes Relative: 8 %
Neutro Abs: 8.3 K/uL — ABNORMAL HIGH (ref 1.7–7.7)
Neutrophils Relative %: 68 %
Platelets: 331 K/uL (ref 150–400)
RBC: 3.96 MIL/uL — ABNORMAL LOW (ref 4.22–5.81)
RDW: 17 % — ABNORMAL HIGH (ref 11.5–15.5)
WBC: 12.2 K/uL — ABNORMAL HIGH (ref 4.0–10.5)
nRBC: 0 % (ref 0.0–0.2)

## 2024-05-03 LAB — COMPREHENSIVE METABOLIC PANEL WITH GFR
ALT: 20 U/L (ref 0–44)
AST: 28 U/L (ref 15–41)
Albumin: 3.2 g/dL — ABNORMAL LOW (ref 3.5–5.0)
Alkaline Phosphatase: 63 U/L (ref 38–126)
Anion gap: 11 (ref 5–15)
BUN: 24 mg/dL — ABNORMAL HIGH (ref 6–20)
CO2: 25 mmol/L (ref 22–32)
Calcium: 8.3 mg/dL — ABNORMAL LOW (ref 8.9–10.3)
Chloride: 96 mmol/L — ABNORMAL LOW (ref 98–111)
Creatinine, Ser: 1.51 mg/dL — ABNORMAL HIGH (ref 0.61–1.24)
GFR, Estimated: 57 mL/min — ABNORMAL LOW (ref >60)
Glucose, Bld: 111 mg/dL — ABNORMAL HIGH (ref 70–99)
Potassium: 3.1 mmol/L — ABNORMAL LOW (ref 3.5–5.1)
Sodium: 132 mmol/L — ABNORMAL LOW (ref 135–145)
Total Bilirubin: 1.7 mg/dL — ABNORMAL HIGH (ref 0.0–1.2)
Total Protein: 7.4 g/dL (ref 6.5–8.1)

## 2024-05-03 LAB — BRAIN NATRIURETIC PEPTIDE: B Natriuretic Peptide: 836.5 pg/mL — ABNORMAL HIGH (ref 0.0–100.0)

## 2024-05-03 LAB — CBG MONITORING, ED
Glucose-Capillary: 99 mg/dL (ref 70–99)
Glucose-Capillary: 106 mg/dL — ABNORMAL HIGH (ref 70–99)

## 2024-05-03 LAB — HIV ANTIBODY (ROUTINE TESTING W REFLEX): HIV Screen 4th Generation wRfx: NONREACTIVE

## 2024-05-03 LAB — TROPONIN I (HIGH SENSITIVITY)
Troponin I (High Sensitivity): 91 ng/L — ABNORMAL HIGH (ref <18)
Troponin I (High Sensitivity): 92 ng/L — ABNORMAL HIGH (ref <18)

## 2024-05-03 MED ORDER — ACETAMINOPHEN 650 MG RE SUPP: 650.0000 mg | Freq: Four times a day (QID) | RECTAL | Status: DC | PRN

## 2024-05-03 MED ORDER — ACETAMINOPHEN 325 MG PO TABS
650.0000 mg | ORAL_TABLET | Freq: Four times a day (QID) | ORAL | Status: DC | PRN
  Administered 2024-05-04 – 2024-05-09 (×2): 650 mg via ORAL
  Filled 2024-05-03 (×2): qty 2

## 2024-05-03 MED ORDER — ONDANSETRON HCL 4 MG PO TABS
4.0000 mg | ORAL_TABLET | Freq: Four times a day (QID) | ORAL | Status: DC | PRN
  Administered 2024-05-04: 4 mg via ORAL
  Filled 2024-05-03: qty 1

## 2024-05-03 MED ORDER — METOPROLOL SUCCINATE ER 25 MG PO TB24: 150.0000 mg | ORAL_TABLET | Freq: Every day | ORAL | Status: DC

## 2024-05-03 MED ORDER — POLYETHYLENE GLYCOL 3350 17 G PO PACK: 17.0000 g | PACK | Freq: Every day | ORAL | Status: DC | PRN

## 2024-05-03 MED ORDER — INSULIN ASPART 100 UNIT/ML IJ SOLN: 0.0000 [IU] | Freq: Every day | INTRAMUSCULAR | Status: DC

## 2024-05-03 MED ORDER — ONDANSETRON HCL 4 MG/2ML IJ SOLN
4.0000 mg | Freq: Four times a day (QID) | INTRAMUSCULAR | Status: DC | PRN
  Administered 2024-05-04 – 2024-05-12 (×3): 4 mg via INTRAVENOUS
  Filled 2024-05-03 (×3): qty 2

## 2024-05-03 MED ORDER — FUROSEMIDE 10 MG/ML IJ SOLN: 80.0000 mg | Freq: Two times a day (BID) | INTRAMUSCULAR | Status: DC

## 2024-05-03 MED ORDER — POTASSIUM CHLORIDE CRYS ER 20 MEQ PO TBCR
80.0000 meq | EXTENDED_RELEASE_TABLET | Freq: Once | ORAL | Status: AC
  Administered 2024-05-03: 80 meq via ORAL
  Filled 2024-05-03: qty 4

## 2024-05-03 MED ORDER — SACUBITRIL-VALSARTAN 49-51 MG PO TABS: 1.0000 | ORAL_TABLET | Freq: Two times a day (BID) | ORAL | Status: DC

## 2024-05-03 MED ORDER — SENNOSIDES-DOCUSATE SODIUM 8.6-50 MG PO TABS
1.0000 | ORAL_TABLET | Freq: Every day | ORAL | Status: DC
  Administered 2024-05-03 – 2024-05-12 (×8): 1 via ORAL
  Filled 2024-05-03 (×10): qty 1

## 2024-05-03 MED ORDER — SODIUM CHLORIDE 0.9% FLUSH
3.0000 mL | Freq: Two times a day (BID) | INTRAVENOUS | Status: DC
  Administered 2024-05-04 – 2024-05-12 (×13): 3 mL via INTRAVENOUS

## 2024-05-03 MED ORDER — OXYCODONE HCL 5 MG PO TABS
5.0000 mg | ORAL_TABLET | ORAL | Status: DC | PRN
  Administered 2024-05-03 – 2024-05-12 (×3): 5 mg via ORAL
  Filled 2024-05-03 (×3): qty 1

## 2024-05-03 MED ORDER — IPRATROPIUM-ALBUTEROL 0.5-2.5 (3) MG/3ML IN SOLN
3.0000 mL | Freq: Once | RESPIRATORY_TRACT | Status: AC
  Administered 2024-05-03: 3 mL via RESPIRATORY_TRACT
  Filled 2024-05-03: qty 3

## 2024-05-03 MED ORDER — FUROSEMIDE 10 MG/ML IJ SOLN
80.0000 mg | Freq: Two times a day (BID) | INTRAMUSCULAR | Status: AC
  Administered 2024-05-03: 80 mg via INTRAVENOUS
  Filled 2024-05-03: qty 8

## 2024-05-03 MED ORDER — DIGOXIN 125 MCG PO TABS
125.0000 ug | ORAL_TABLET | Freq: Every day | ORAL | Status: DC
Start: 2024-05-04 — End: 2024-05-12
  Administered 2024-05-04 – 2024-05-12 (×9): 125 ug via ORAL
  Filled 2024-05-03 (×9): qty 1

## 2024-05-03 MED ORDER — MORPHINE SULFATE (PF) 2 MG/ML IV SOLN: 2.0000 mg | INTRAVENOUS | Status: DC | PRN

## 2024-05-03 MED ORDER — THIAMINE MONONITRATE 100 MG PO TABS
100.0000 mg | ORAL_TABLET | Freq: Every day | ORAL | Status: DC
  Administered 2024-05-03 – 2024-05-12 (×9): 100 mg via ORAL
  Filled 2024-05-03 (×10): qty 1

## 2024-05-03 MED ORDER — FUROSEMIDE 10 MG/ML IJ SOLN
15.0000 mg/h | INTRAVENOUS | Status: DC
  Administered 2024-05-03 – 2024-05-11 (×15): 15 mg/h via INTRAVENOUS
  Filled 2024-05-03 (×17): qty 20

## 2024-05-03 MED ORDER — AMIODARONE HCL IN DEXTROSE 360-4.14 MG/200ML-% IV SOLN
30.0000 mg/h | INTRAVENOUS | Status: AC
  Administered 2024-05-03: 30 mg/h via INTRAVENOUS

## 2024-05-03 MED ORDER — DAPAGLIFLOZIN PROPANEDIOL 10 MG PO TABS
10.0000 mg | ORAL_TABLET | Freq: Every day | ORAL | Status: DC
  Administered 2024-05-04 – 2024-05-11 (×7): 10 mg via ORAL
  Filled 2024-05-03 (×8): qty 1

## 2024-05-03 MED ORDER — FUROSEMIDE 10 MG/ML IJ SOLN
80.0000 mg | Freq: Once | INTRAMUSCULAR | Status: AC
  Administered 2024-05-03: 80 mg via INTRAVENOUS
  Filled 2024-05-03: qty 8

## 2024-05-03 MED ORDER — AMIODARONE HCL IN DEXTROSE 360-4.14 MG/200ML-% IV SOLN
30.0000 mg/h | INTRAVENOUS | Status: DC
  Administered 2024-05-04 – 2024-05-11 (×16): 30 mg/h via INTRAVENOUS
  Filled 2024-05-03 (×16): qty 200

## 2024-05-03 MED ORDER — SPIRONOLACTONE 25 MG PO TABS
25.0000 mg | ORAL_TABLET | Freq: Every day | ORAL | Status: DC
  Administered 2024-05-03 – 2024-05-05 (×3): 25 mg via ORAL
  Filled 2024-05-03 (×3): qty 1

## 2024-05-03 MED ORDER — FUROSEMIDE 10 MG/ML IJ SOLN: 40.0000 mg | Freq: Two times a day (BID) | INTRAMUSCULAR | Status: DC

## 2024-05-03 MED ORDER — HYDRALAZINE HCL 20 MG/ML IJ SOLN: 5.0000 mg | INTRAMUSCULAR | Status: DC | PRN

## 2024-05-03 MED ORDER — METOPROLOL SUCCINATE ER 50 MG PO TB24
50.0000 mg | ORAL_TABLET | Freq: Every day | ORAL | Status: DC
  Administered 2024-05-03 – 2024-05-04 (×2): 50 mg via ORAL
  Filled 2024-05-03 (×2): qty 2

## 2024-05-03 MED ORDER — BISACODYL 5 MG PO TBEC: 5.0000 mg | DELAYED_RELEASE_TABLET | Freq: Every day | ORAL | Status: DC | PRN

## 2024-05-03 MED ORDER — INSULIN ASPART 100 UNIT/ML IJ SOLN
0.0000 [IU] | Freq: Three times a day (TID) | INTRAMUSCULAR | Status: DC
  Administered 2024-05-04: 2 [IU] via SUBCUTANEOUS
  Administered 2024-05-04: 3 [IU] via SUBCUTANEOUS
  Administered 2024-05-06 – 2024-05-09 (×8): 2 [IU] via SUBCUTANEOUS
  Administered 2024-05-10: 3 [IU] via SUBCUTANEOUS
  Administered 2024-05-11 (×2): 2 [IU] via SUBCUTANEOUS
  Filled 2024-05-03 (×12): qty 2

## 2024-05-03 NOTE — H&P (Signed)
 History and Physical    Patient: Kyle Pearson FMW:[id number] DOB: [birth year] DOA: 05/03/2024 DOS: the patient was seen and examined on 05/03/2024 . PCP: Kayla Jeoffrey RAMAN, FNP  Patient coming from: GI clinic. Chief complaint: Chief Complaint  Patient presents with   Shortness of Breath   HPI:  Kyle Pearson is a 48 y.o. male with past medical history  of tobacco and alcohol abuse, history of neurocysticercosis, OSA, pulmonary hypertension, chronic systolic congestive heart failure morbid obesity with a BMI 55.5, diabetes mellitus type 2, atrial flutter, anemia, presents today for shortness of breath lower extremity edema and generalized weakness from the [facility] office today.  Patient therefore scheduled follow-up and was planning to come to the emergency room today to the appointment.  No reports of chest pain palpitation nausea vomiting or bleeding.  Patient at bedside is ill-appearing volume overloaded edematous and obese.  ED Course:  Vital signs in the ED were notable for the following:  Vitals:   05/03/24 1715 05/03/24 1733 05/03/24 1734 05/03/24 1745  BP: 108/72 120/83 120/83 (!) 122/101  Pulse: (!) 101 (!) 110 93 (!) 107  Temp:      Resp: 15 20  17   Height:      Weight:      SpO2: 96% 99%  100%  TempSrc:      BMI (Calculated):       >>ED evaluation thus far shows: Initial CMP showing sodium 132 potassium 3.1 glucose 111 BUN 24 creatinine 1.51 eGFR 57, total bili 1.7. BNP of 836.5, troponin of 91. CBC shows white count of 12.2 hemoglobin of 9.8 platelets of 331.       >>While in the ED patient received the following: Medications  potassium chloride  SA (KLOR-CON  M) CR tablet 80 mEq (has no administration in time range)  ipratropium-albuterol  (DUONEB) 0.5-2.5 (3) MG/3ML nebulizer solution 3 mL (has no administration in time range)  furosemide  (LASIX ) injection 80 mg (has no administration in time range)   Review of Systems  Respiratory:  Positive for  shortness of breath.   Cardiovascular:  Positive for leg swelling.   Past Medical History:  Diagnosis Date   Atrial flutter (HCC)    CHF (congestive heart failure) (HCC)    OSA (obstructive sleep apnea)    Pacemaker    Pulmonary hypertensive venous disease (HCC)    Type 2 diabetes mellitus with hyperglycemia, without long-term current use of insulin [facility])    Past Surgical History:  Procedure Laterality Date   RIGHT HEART CATH N/A 12/08/2020   Procedure: RIGHT HEART CATH;  Surgeon: Rolan Ezra RAMAN, MD;  Location: [facility] INVASIVE CV LAB;  Service: Cardiovascular;  Laterality: N/A;   RIGHT/LEFT HEART CATH AND CORONARY ANGIOGRAPHY N/A 06/01/2020   Procedure: RIGHT/LEFT HEART CATH AND CORONARY ANGIOGRAPHY;  Surgeon: Rolan Ezra RAMAN, MD;  Location: [facility] INVASIVE CV LAB;  Service: Cardiovascular;  Laterality: N/A;   SUBQ ICD IMPLANT N/A 01/26/2021   Procedure: SUBQ ICD IMPLANT;  Surgeon: Inocencio Soyla Lunger, MD;  Location: [facility] INVASIVE CV LAB;  Service: Cardiovascular;  Laterality: N/A;    reports that he quit smoking about 5 years ago. His smoking use included cigarettes. He has never used smokeless tobacco. He reports that he does not currently use alcohol. He reports that he does not currently use drugs. Allergies  Allergen Reactions   Raspberry Anaphylaxis   Penicillins Rash    Reaction: Childhood   Family History  Problem Relation Age of Onset   Diabetes Mother  Stroke Father    Diabetes Sister    Prior to Admission medications   Medication Sig Start Date End Date Taking? Authorizing Provider  bisacodyl  (DULCOLAX) 5 MG EC tablet Take 10 mg by mouth daily as needed for mild constipation.    [provider]  Cholecalciferol (VITAMIN D3) 50 MCG (2000 UT) capsule Take 1 capsule (2,000 Units total) by mouth daily. 04/27/24   Kayla Jeoffrey RAMAN, FNP  digoxin  (LANOXIN ) 0.125 MG tablet Take 1 tablet (125 mcg total) by mouth daily. NEEDS FOLLOW UP APPOINTMENT FOR MORE REFILLS 02/21/24    Lelon Hamilton T, PA-C  FARXIGA  10 MG TABS tablet Take 1 tablet (10 mg total) by mouth daily before breakfast. 02/21/24   Lelon Hamilton T, PA-C  Iron, Ferrous Sulfate, 325 (65 Fe) MG TABS Take 325 mg by mouth every other day. 04/27/24   Kayla Jeoffrey RAMAN, FNP  metoprolol  succinate (TOPROL -XL) 100 MG 24 hr tablet Take 1.5 tablets (150 mg total) by mouth daily. 04/27/24   Kayla Jeoffrey RAMAN, FNP  potassium chloride  SA (KLOR-CON  M20) 20 MEQ tablet Take 3 tablets (60 mEq total) by mouth daily. 10/08/23   Milford, Harlene HERO, FNP  sacubitril -valsartan  (ENTRESTO ) 49-51 MG Take 1 tablet by mouth 2 (two) times daily.    [provider]  spironolactone  (ALDACTONE ) 25 MG tablet Take 1 tablet (25 mg total) by mouth daily. 03/09/24   Glena Harlene HERO, FNP  thiamine  100 MG tablet TAKE 1 TABLET BY MOUTH EVERY DAY 10/11/20   Milford, Jessica M, FNP  tirzepatide [facility]) 2.5 MG/0.5ML Pen Inject 2.5 mg into the skin once a week. 04/27/24   Kayla Jeoffrey RAMAN, FNP  torsemide  (DEMADEX ) 20 MG tablet Take 3 tablets (60 mg total) by mouth daily. 02/21/24   Lelon Hamilton T, PA-C                                                                                 Vitals:   05/03/24 1715 05/03/24 1733 05/03/24 1734 05/03/24 1745  BP: 108/72 120/83 120/83 (!) 122/101  Pulse: (!) 101 (!) 110 93 (!) 107  Resp: 15 20  17   Temp:      TempSrc:      SpO2: 96% 99%  100%  Weight:      Height:       Physical Exam Vitals reviewed.  Constitutional:      General: He is not in acute distress.    Appearance: He is obese. He is not ill-appearing.  HENT:     Head: Normocephalic.  Eyes:     Extraocular Movements: Extraocular movements intact.  Cardiovascular:     Rate and Rhythm: Normal rate and regular rhythm.     Heart sounds: Normal heart sounds.  Pulmonary:     Breath sounds: Rales present.  Abdominal:     General: There is no distension.     Palpations: Abdomen is soft.     Tenderness: There is no abdominal tenderness.   Musculoskeletal:     Right lower leg: Edema present.     Left lower leg: Edema present.  Neurological:     General: No focal deficit present.     Mental Status:  He is alert and oriented to person, place, and time.     Cranial Nerves: No cranial nerve deficit.     Motor: No weakness.  Psychiatric:        Behavior: Behavior normal.     Labs on Admission: I have personally reviewed following labs and imaging studies CBC: Recent Labs  Lab 05/03/24 1104  WBC 12.2*  NEUTROABS 8.3*  HGB 9.8*  HCT 31.8*  MCV 80.3  PLT 331   Basic Metabolic Panel: Recent Labs  Lab 05/03/24 1104  NA 132*  K 3.1*  CL 96*  CO2 25  GLUCOSE 111*  BUN 24*  CREATININE 1.51*  CALCIUM  8.3*   GFR: Estimated Creatinine Clearance: 86.1 mL/min (A) (by C-G formula based on SCr of 1.51 mg/dL (H)). Liver Function Tests: Recent Labs  Lab 05/03/24 1104  AST 28  ALT 20  ALKPHOS 63  BILITOT 1.7*  PROT 7.4  ALBUMIN 3.2*   No results for input(s): LIPASE, AMYLASE in the last 168 hours. No results for input(s): AMMONIA in the last 168 hours. Recent Labs    05/23/23 0928 07/04/23 0914 10/08/23 1006 10/22/23 1203 03/08/24 1749 04/21/24 1149 05/03/24 1104  BUN 11 9 8 13 10 23  24*  CREATININE 0.89 0.69 0.69 0.82 0.85 1.44* 1.51*    Cardiac Enzymes: No results for input(s): CKTOTAL, CKMB, CKMBINDEX, TROPONINI in the last 168 hours. BNP (last 3 results) No results for input(s): PROBNP in the last 8760 hours. HbA1C: No results for input(s): HGBA1C in the last 72 hours. CBG: Recent Labs  Lab 05/03/24 1615 05/03/24 1816  GLUCAP 99 106*   Lipid Profile: No results for input(s): CHOL, HDL, LDLCALC, TRIG, CHOLHDL, LDLDIRECT in the last 72 hours. Thyroid  Function Tests: No results for input(s): TSH, T4TOTAL, FREET4, T3FREE, THYROIDAB in the last 72 hours. Anemia Panel: No results for input(s): VITAMINB12, FOLATE, FERRITIN, TIBC, IRON,  RETICCTPCT in the last 72 hours. Urine analysis:    Component Value Date/Time   COLORURINE YELLOW 04/27/2024 1215   APPEARANCEUR CLEAR 04/27/2024 1215   LABSPEC 1.015 04/27/2024 1215   PHURINE 6.5 04/27/2024 1215   GLUCOSEU 3+ (A) 04/27/2024 1215   HGBUR TRACE (A) 04/27/2024 1215   BILIRUBINUR NEGATIVE 03/13/2007 1222   KETONESUR NEGATIVE 04/27/2024 1215   PROTEINUR 3+ (A) 04/27/2024 1215   UROBILINOGEN 0.2 03/13/2007 1222   NITRITE NEGATIVE 04/27/2024 1215   LEUKOCYTESUR NEGATIVE 04/27/2024 1215   Radiological Exams on Admission: US  ASCITES (ABDOMEN LIMITED) Result Date: 05/03/2024 CLINICAL DATA:  [id number] Ascites [id number] EXAM: LIMITED ABDOMEN ULTRASOUND FOR ASCITES TECHNIQUE: Limited ultrasound survey for ascites was performed in all four abdominal quadrants. COMPARISON:  None Available. FINDINGS: No ascites. IMPRESSION: No ascites. Electronically Signed   By: Rogelia Myers M.D.   On: 05/03/2024 17:36   DG Chest 2 View Result Date: 05/03/2024 EXAM: 2 VIEW(S) XRAY OF THE CHEST 05/03/2024 11:34:00 AM COMPARISON: 04/28/2024 CLINICAL HISTORY: sob FINDINGS: LINES, TUBES AND DEVICES: Left chest cardiac defibrillator noted. LUNGS AND PLEURA: No focal pulmonary opacity. No pulmonary edema. No pleural effusion. No pneumothorax. HEART AND MEDIASTINUM: Cardiomegaly. Lead from the left chest cardiac defibrillator terminates over the mediastinum. BONES AND SOFT TISSUES: No acute osseous abnormality. IMPRESSION: 1. No acute cardiopulmonary findings. 2. Stable enlargement of the cardiac silhouette. Electronically signed by: Waddell Calk MD 05/03/2024 01:13 PM EST RP Workstation: [id number]   Data Reviewed: Relevant notes from primary care and specialist visits, past discharge summaries as available in EHR, including Care Everywhere . Prior diagnostic testing  as pertinent to current admission diagnoses, Updated medications and problem lists for reconciliation .ED course, including vitals, labs, imaging,  treatment and response to treatment,Triage notes, nursing and pharmacy notes and ED provider's notes.Notable results as noted in HPI.Discussed case with EDMD/ ED APP/ or Specialty MD on call and as needed.  Assessment & Plan  >> Acute on chronic systolic congestive heart failure/AICD: Will admit to progressive unit.  Strict I/O, weights, telemetry and pulse oximetry.  Education of diet and sodium restriction.  Cardiology consult. We will cont with torsemide  10 mg BID . PTA meds include Farxiga  10, digoxin  0.125, metoprolol  100, Entresto , Aldactone , torsemide , of which we will continue digoxin  , metoprolol .  Hold other meds.   >>AKI: Lab Results  Component Value Date   CREATININE 1.51 (H) 05/03/2024   CREATININE 1.44 (H) 04/21/2024   CREATININE 0.85 03/08/2024  Currently hold Entresto  Aldactone .  Continue torsemide  and digoxin  along with metoprolol .  Avoid contrast studies.   >> Atrial flutter: H/o atrial flutter and no AC on board currently.  CHA2DS2/VAS Stroke Risk Points  Current as of about an hour ago     2 >= 2 Points: High Risk  1 to 1.99 Points: Medium Risk  0 Points: Low Risk  Will d/w pt about AC, pt agrees with [facility] , but held due to GIB concern.      >>Prolonged qtc: Avoid meds that may do the same , antibiotics ,antiemetic etc.  Correct electrolytes.    >> OSA on CPAP: Cont cpap.    >> Morbid obesity with a BMI of 55.52 / DM 2: A1c today is 6.6, glycemic protocol.  PTA meds include Farxiga , Mounjaro.   >> Normocytic anemia: Elevated white count of 12.2 hemoglobin of 9.8 and platelets of 331. Will occult and IV ppi.    DVT prophylaxis:  scd's Consults:  Cardiology.   Advance Care Planning:    Code Status: Full Code   Family Communication:  None.   Disposition Plan:  Home.   Severity of Illness: The appropriate patient status for this patient is INPATIENT. Inpatient status is judged to be reasonable and necessary in order to provide the required  intensity of service to ensure the patient's safety. The patient's presenting symptoms, physical exam findings, and initial radiographic and laboratory data in the context of their chronic comorbidities is felt to place them at high risk for further clinical deterioration. Furthermore, it is not anticipated that the patient will be medically stable for discharge from the hospital within 2 midnights of admission.   * I certify that at the point of admission it is my clinical judgment that the patient will require inpatient hospital care spanning beyond 2 midnights from the point of admission due to high intensity of service, high risk for further deterioration and high frequency of surveillance required.*  Unresulted Labs (From admission, onward)     Start     Ordered   05/04/24 0500  Digoxin  level  Tomorrow morning,   R        05/03/24 1627   05/04/24 0500  Basic metabolic panel  Tomorrow morning,   R        05/03/24 1627   05/04/24 0500  CBC  Tomorrow morning,   R        05/03/24 1627   05/04/24 0500  Iron and TIBC  Tomorrow morning,   R        05/03/24 1638   05/04/24 0500  Ferritin  Tomorrow morning,  R        05/03/24 1638   05/04/24 0500  Heparin  level (unfractionated)  Daily,   R      05/03/24 1720   05/04/24 0000  Heparin  level (unfractionated)  Once-Timed,   TIMED        05/03/24 1720   05/03/24 1614  Occult blood card to lab, stool  Once,   R        05/03/24 1616   05/03/24 1606  HIV Antibody (routine testing w rflx)  (HIV Antibody (Routine testing w reflex) panel)  Once,   R        05/03/24 1608            Meds ordered this encounter  Medications   potassium chloride  SA (KLOR-CON  M) CR tablet 80 mEq   ipratropium-albuterol  (DUONEB) 0.5-2.5 (3) MG/3ML nebulizer solution 3 mL   furosemide  (LASIX ) injection 80 mg   insulin aspart (novoLOG) injection 0-15 Units    Correction coverage::   Moderate (average weight, post-op)    CBG < 70::   Implement Hypoglycemia Standing  Orders and refer to Hypoglycemia Standing Orders sidebar report    CBG 70 - 120::   0 units    CBG 121 - 150::   2 units    CBG 151 - 200::   3 units    CBG 201 - 250::   5 units    CBG 251 - 300::   8 units    CBG 301 - 350::   11 units    CBG 351 - 400::   15 units    CBG > 400:   call MD and obtain STAT lab verification   insulin aspart (novoLOG) injection 0-5 Units    Correction coverage::   HS scale    CBG < 70::   Implement Hypoglycemia Standing Orders and refer to Hypoglycemia Standing Orders sidebar report    CBG 70 - 120::   0 units    CBG 121 - 150::   0 units    CBG 151 - 200::   0 units    CBG 201 - 250::   2 units    CBG 251 - 300::   3 units    CBG 301 - 350::   4 units    CBG 351 - 400::   5 units    CBG > 400:   call MD and obtain STAT lab verification   sodium chloride  flush (NS) 0.9 % injection 3 mL   OR Linked Order Group    acetaminophen  (TYLENOL ) tablet 650 mg    acetaminophen  (TYLENOL ) suppository 650 mg   oxyCODONE  (Oxy IR/ROXICODONE ) immediate release tablet 5 mg    Refill:  0   DISCONTD: morphine (PF) 2 MG/ML injection 2 mg   polyethylene glycol (MIRALAX / GLYCOLAX) packet 17 g   bisacodyl  (DULCOLAX) EC tablet 5 mg   OR Linked Order Group    ondansetron  (ZOFRAN ) tablet 4 mg    ondansetron  (ZOFRAN ) injection 4 mg   hydrALAZINE  (APRESOLINE ) injection 5 mg   DISCONTD: furosemide  (LASIX ) injection 40 mg   DISCONTD: furosemide  (LASIX ) injection 80 mg   furosemide  (LASIX ) 200 mg in dextrose  5 % 100 mL (2 mg/mL) infusion   digoxin  (LANOXIN ) tablet 125 mcg   DISCONTD: metoprolol  succinate (TOPROL -XL) 24 hr tablet 150 mg   DISCONTD: sacubitril -valsartan  (ENTRESTO ) 49-51 mg per tablet    ACE-inhibitors have NOT been administered in the past 36-hours.:   NOT APPLICABLE (continuing home med)  thiamine  (VITAMIN B1) tablet 100 mg   amiodarone (NEXTERONE PREMIX) 360-4.14 MG/200ML-% (1.8 mg/mL) IV infusion   amiodarone (NEXTERONE PREMIX) 360-4.14 MG/200ML-% (1.8  mg/mL) IV infusion   spironolactone  (ALDACTONE ) tablet 25 mg   dapagliflozin  propanediol (FARXIGA ) tablet 10 mg   metoprolol  succinate (TOPROL -XL) 24 hr tablet 50 mg   senna-docusate (Senokot-S) tablet 1 tablet   furosemide  (LASIX ) injection 80 mg     Orders Placed This Encounter  Procedures   DG Chest 2 View   US  ASCITES (ABDOMEN LIMITED)   Comprehensive metabolic panel   CBC with Differential   Brain natriuretic peptide   HIV Antibody (routine testing w rflx)   Occult blood card to lab, stool   Digoxin  level   Basic metabolic panel   CBC   Iron and TIBC   Ferritin   Heparin  level (unfractionated)   Heparin  level (unfractionated)   Diet Heart Room service appropriate? Yes; Fluid consistency: Thin   ED Cardiac monitoring   Notify physician (specify)   Initiate Heart Failure Care Plan   Daily weights   Strict intake and output   In and Out Cath   Patient Education:   Apply Heart Failure Care Plan   [facility] and AP only) Obtain REDS clips reading Every morning   Apply Diabetes Mellitus Care Plan   STAT CBG when hypoglycemia is suspected. If treated, recheck every 15 minutes after each treatment until CBG >/= 70 mg/dl   Refer to Hypoglycemia Protocol Sidebar Report for treatment of CBG < 70 mg/dl   Maintain IV access   Vital signs   Notify physician (specify)   Mobility Protocol: No Restrictions   Refer to Sidebar Report Mobility Protocol for Adult Inpatient   Initiate Adult Central Line Maintenance and Catheter Clearance Protocol for patients with central line (CVC, PICC, Port, Hemodialysis, Trialysis)   If patient diabetic or glucose greater than 140 notify physician for Sliding Scale Insulin Orders   Initiate CHG Protocol for patients in ICU/SD or any patient with a central line or foley catheter   Do not place and if present remove PureWick   Initiate Oral Care Protocol   Initiate Carrier Fluid Protocol   RN may order General Admission PRN Orders utilizing General  Admission PRN medications (through manage orders) for the following patient needs: allergy symptoms (Claritin), cold sores (Carmex), cough (Robitussin DM), eye irritation (Liquifilm Tears), hemorrhoids (Tucks), indigestion (Maalox), minor skin irritation (Hydrocortisone Cream), muscle pain (Ben Gay), nose irritation (saline nasal spray) and sore throat (Chloraseptic spray).   Cardiac Monitoring - Continuous Indefinite   Apply Unna Boot   Change [employer]   Full code   Consult to hospitalist  HF exacerbation, hypervolemia, needs diuresis   Inpatient consult to Cardiology Consult Timeframe: ROUTINE - requires response within 24 hours; Reason for Consult? CHF Already called   Consult to Transition of Care Team   Consult to Heart Failure Navigation Team [facility], [facility], and [facility])   Nutritional services consult   heparin  per pharmacy consult   Pulse oximetry check with vital signs   Oxygen therapy Mode or (Route): Nasal cannula; Liters Per Minute: 2; Keep O2 saturation between: greater than 92 %   Incentive spirometry   CPAP   CBG monitoring, ED   CBG monitoring, ED   ED EKG   EKG 12-Lead   Repeat EKG   EKG 12-Lead   ECHOCARDIOGRAM COMPLETE   Insert peripheral IV   Admit to Inpatient (patient's expected length of stay will be greater than  2 midnights or inpatient only procedure)    Author: Mario LULLA Blanch, MD 12 pm- 8 pm. [facility]. 05/03/2024 7:41 PM Please note for any communication after hours contact [facility] Assigned provider on call on Amion.

## 2024-05-03 NOTE — ED Notes (Signed)
 Extra urine sent to main lab

## 2024-05-03 NOTE — ED Triage Notes (Signed)
 Patient went to his GI doctor today for anemia work up, wasn't able to hardly walk from his car to the office was sob upon arrival to office sat were 89%. Patient states he has a hx.of chf and is 40 mg of torsemide  daily states he is compliant with medications . States he isn't able to walk very far without coughing and sob

## 2024-05-03 NOTE — ED Notes (Signed)
 Provider made aware that trop is 121 at this time.

## 2024-05-03 NOTE — Progress Notes (Signed)
 Kyle Pearson [id number] 06/17/1976   Chief Complaint: Low energy, shortness of breath, bloating, edema, chronic cough, GERD, anemia  Referring Provider: Kayla Jeoffrey RAMAN, FNP Primary GI MD: Unassigned  HPI: Kyle Pearson is a 48 y.o. male with past medical history of CHF, OSA, pulmonary hypertensive venous disease, pacemaker in place, T2DM, atrial flutter, morbid obesity who is referred by primary care for anemia, hemorrhoids, and colon cancer screening.    Seen 04/21/2024 for annual physical/to establish care with primary care.  Noted to have severe heart failure with fatigue and shortness of breath.  Has been referred to pulmonology for CPAP evaluation for OSA.  Started on Zepbound for weight management.  Follows with cardiology for CHF.  Labs 04/21/2024: Hemoglobin 10.2, MCV 84.4 (dropped from 91.2), platelets 366, total bilirubin 2.6, otherwise normal liver enzymes, normal TSH, normal B12 and folate, evidence of iron deficiency with ferritin 14, percent saturation 7.  Last visit with cardiology 03/09/2024.  Echo 1/25 showed EF 20-25%, G3DD, RV mildly reduced. Seen in ED 03/08/24 with CP. Out of several meds x 3 weeks. ECG and HsTroponin reassuring.   Discussed the use of AI scribe software for clinical note transcription with the patient, who gave verbal consent to proceed.  History of Present Illness Kyle Pearson is a 48 year old male who presents with shortness of breath and fluid retention. He was referred by his primary care doctor to discuss colonoscopy and anemia.  Dyspnea and fluid retention - Shortness of breath for the past two weeks, occurring with minimal exertion such as walking short distances - Sensation of fluid retention extending up to the throat - History of similar episodes requiring hospitalization for fluid drainage  Peripheral edema and abdominal distension - Swelling present for the past two months, particularly in the abdomen and legs -  Leg swelling is persistent and severe, impeding ability to bend over  Presyncope and lightheadedness - Lightheadedness and sensation of near-fainting - No episodes of loss of consciousness  Chest pain and palpitations - Intermittent chest pain and palpitations, especially with exertion such as walking across a parking lot - Required use of a wheelchair on three occasions recently due to symptoms  Cough - Cough present for the past couple of weeks  Patient states he was unable to sleep last night due to shortness of breath and discomfort from fluid retention.  States he feels dehydrated.  This morning woke up feeling nauseated.  Has been feeling increasingly fatigued and weak.  States he cannot stand for more than 15 minutes at a time.  Has difficulty raising his arms.  Recently started on iron supplementation and states he has been experiencing constipation.  Denies any rectal bleeding or melena, but states his stools have been yellow.  Per cardiology note from 03/09/2024 patient had evidence of early cirrhosis on abdominal ultrasound at [facility].  Patient denies any known diagnosis of cirrhosis.  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  He is aware of the need to limit sodium in his diet and tries to avoid it.  Reports he has sleep apnea but has not received his CPAP yet.  Per chart review has follow-up with cardiologist Dr. Shlomo in 2 days.  Has been taking all medications as prescribed, though did not take his meds this morning.  Has them with him.  States he was planning to go to the ER after this visit today due  to his significant fluid retention and shortness of breath.  Drove himself here.  Now feels he is unable to drive himself from the office.  Per result follow-up 04/28/2024, remote defibrillator interrogation was reviewed and AF noted, recommendation to see AF clinic to discuss symptoms and  possible OAC.  Previous GI Procedures/Imaging      Past Medical History:  Diagnosis Date   Atrial flutter (HCC)    CHF (congestive heart failure) (HCC)    OSA (obstructive sleep apnea)    Pacemaker    Pulmonary hypertensive venous disease (HCC)    Type 2 diabetes mellitus with hyperglycemia, without long-term current use of insulin [facility])     Past Surgical History:  Procedure Laterality Date   RIGHT HEART CATH N/A 12/08/2020   Procedure: RIGHT HEART CATH;  Surgeon: Rolan Ezra RAMAN, MD;  Location: [facility] INVASIVE CV LAB;  Service: Cardiovascular;  Laterality: N/A;   RIGHT/LEFT HEART CATH AND CORONARY ANGIOGRAPHY N/A 06/01/2020   Procedure: RIGHT/LEFT HEART CATH AND CORONARY ANGIOGRAPHY;  Surgeon: Rolan Ezra RAMAN, MD;  Location: [facility] INVASIVE CV LAB;  Service: Cardiovascular;  Laterality: N/A;   SUBQ ICD IMPLANT N/A 01/26/2021   Procedure: SUBQ ICD IMPLANT;  Surgeon: Inocencio Soyla Lunger, MD;  Location: [facility] INVASIVE CV LAB;  Service: Cardiovascular;  Laterality: N/A;    Current Outpatient Medications  Medication Sig Dispense Refill   bisacodyl  (DULCOLAX) 5 MG EC tablet Take 10 mg by mouth daily as needed for mild constipation.     Cholecalciferol (VITAMIN D3) 50 MCG (2000 UT) capsule Take 1 capsule (2,000 Units total) by mouth daily. 90 capsule 0   digoxin  (LANOXIN ) 0.125 MG tablet Take 1 tablet (125 mcg total) by mouth daily. NEEDS FOLLOW UP APPOINTMENT FOR MORE REFILLS 90 tablet 0   FARXIGA  10 MG TABS tablet Take 1 tablet (10 mg total) by mouth daily before breakfast. 90 tablet 3   Iron, Ferrous Sulfate, 325 (65 Fe) MG TABS Take 325 mg by mouth every other day. 30 tablet 1   metoprolol  succinate (TOPROL -XL) 100 MG 24 hr tablet Take 1.5 tablets (150 mg total) by mouth daily. 90 tablet 0   potassium chloride  SA (KLOR-CON  M20) 20 MEQ tablet Take 3 tablets (60 mEq total) by mouth daily. 270 tablet 3   sacubitril -valsartan  (ENTRESTO ) 49-51 MG Take 1 tablet by mouth 2 (two) times daily.      spironolactone  (ALDACTONE ) 25 MG tablet Take 1 tablet (25 mg total) by mouth daily. 90 tablet 2   thiamine  100 MG tablet TAKE 1 TABLET BY MOUTH EVERY DAY 30 tablet 11   tirzepatide (MOUNJARO) 2.5 MG/0.5ML Pen Inject 2.5 mg into the skin once a week. 2 mL 1   torsemide  (DEMADEX ) 20 MG tablet Take 3 tablets (60 mg total) by mouth daily. 270 tablet 3   No current facility-administered medications for this visit.    Allergies as of 05/03/2024 - Review Complete 05/03/2024  Allergen Reaction Noted   Raspberry Anaphylaxis 04/24/2020   Penicillins Rash 03/16/2020    Family History  Problem Relation Age of Onset   Diabetes Mother    Stroke Father    Diabetes Sister     Social History   Tobacco Use   Smoking status: Former    Current packs/day: 0.00    Types: Cigarettes    Quit date: 2020    Years since quitting: 5.8   Smokeless tobacco: Never  Vaping Use   Vaping status: Never Used  Substance Use Topics   Alcohol use:  Not Currently   Drug use: Not Currently     Review of Systems:    Constitutional: No weight loss, fever, chills. Positive for weakness and fatigue. Cardiovascular: Positive for intermittent chest pain and notices a racing heart Respiratory: Positive for shortness of breath with minimal exertion and at rest, and cough Gastrointestinal: See HPI and otherwise negative Neurological: Lightheadedness, presyncope but denies syncopal events   Physical Exam:  Vital signs: BP 128/80   Pulse 66   Ht 5' 6 (1.676 m)   Wt (!) 345 lb (156.5 kg)   SpO2 (!) 89% Comment: Camie made aware.  BMI 55.68 kg/m   Constitutional: Morbidly obese male, appears unwell, noted to have increased work of breathing, alert and cooperative, examined sitting upright due to shortness of breath Head:  Normocephalic and atraumatic.  Eyes: No scleral icterus.  Respiratory: Respirations even.  Appears short of breath. Lungs clear to auscultation bilaterally.  No wheezes, crackles, or rhonchi  appreciated. Cardiovascular: Rate 95, irregular rhythm. No murmurs.  Significant bilateral pitting edema of lower extremities. Gastrointestinal: Protuberant, distended, tense abdomen. No rebound or guarding.  Bowel sounds appreciated.  No appreciable masses. Rectal:  Not performed.  Neurologic:  Alert and oriented x4;  grossly normal neurologically.  Skin:   Dry and intact without significant lesions or rashes. Psychiatric: Oriented to person, place and time. Demonstrates good judgement and reason without abnormal affect or behaviors.  RELEVANT LABS AND IMAGING: CBC    Component Value Date/Time   WBC 12.4 (H) 04/21/2024 1149   RBC 3.98 (L) 04/21/2024 1149   HGB 10.2 (L) 04/21/2024 1149   HGB 14.0 01/15/2021 0850   HCT 33.6 (L) 04/21/2024 1149   HCT 41.6 01/15/2021 0850   PLT 366 04/21/2024 1149   PLT 226 01/15/2021 0850   MCV 84.4 04/21/2024 1149   MCV 90 01/15/2021 0850   MCH 25.6 (L) 04/21/2024 1149   MCHC 30.4 (L) 04/21/2024 1149   RDW 15.1 (H) 04/21/2024 1149   RDW 15.2 01/15/2021 0850   LYMPHSABS 2.1 12/08/2020 1452   MONOABS 0.5 12/08/2020 1452   EOSABS 186 04/21/2024 1149   BASOSABS 87 04/21/2024 1149    CMP     Component Value Date/Time   NA 137 04/21/2024 1149   NA 139 09/25/2022 1204   K 3.6 04/21/2024 1149   CL 95 (L) 04/21/2024 1149   CO2 31 04/21/2024 1149   GLUCOSE 119 (H) 04/21/2024 1149   BUN 23 04/21/2024 1149   BUN 8 09/25/2022 1204   CREATININE 1.44 (H) 04/21/2024 1149   CALCIUM  9.3 04/21/2024 1149   PROT 7.7 04/21/2024 1149   ALBUMIN 3.3 (L) 03/09/2024 0009   AST 26 04/21/2024 1149   ALT 21 04/21/2024 1149   ALKPHOS 70 03/09/2024 0009   BILITOT 2.6 (H) 04/21/2024 1149   GFRNONAA >60 03/08/2024 1749   GFRAA >60 03/17/2020 0120   Echocardiogram 07/25/2023 1. Left ventricular ejection fraction, by estimation, is 20 to 25% . The left ventricle has severely decreased function. The left ventricle demonstrates global hypokinesis. The left ventricular  internal cavity size was severely dilated. Left ventricular diastolic parameters are consistent with Grade III diastolic dysfunction ( restrictive) . No LV thrombus.  2. Right ventricular systolic function is mildly reduced. The right ventricular size is normal. Tricuspid regurgitation signal is inadequate for assessing PA pressure.  3. Left atrial size was moderately dilated.  4. Right atrial size was moderately dilated.  5. The mitral valve is normal in structure. Trivial mitral  valve regurgitation. No evidence of mitral stenosis.  6. The aortic valve is tricuspid. Aortic valve regurgitation is not visualized. No aortic stenosis is present.  7. IVC not visualized.   Assessment/Plan:   Assessment and Plan Assessment & Plan Fluid overload in setting of CHF Significant edema of bilateral lower extremities as well as abdominal distention causing discomfort and dyspnea.  Current abdominal pain and leg swelling. - EMS was called to transport patient to the ER for further evaluation and management   Acute hypoxemia Oxygen saturation below 90% likely due to fluid overload and respiratory compromise.  Acute shortness of breath Attributed to fluid overload and hypoxemia, possibly cardiac-related. - Referred to ER for comprehensive evaluation.  Tachycardia Intermittent tachycardia with exertion and fluid overload. Lightheadedness present.  Abnormal heart rhythm appreciated on exam.  Per cardiology result follow-up 04/28/2024 A-fib was noted on remote defibrillator interrogation.  Not on oral anticoagulation. - Referred to ER for evaluation of tachycardia and cardiac causes.   Iron deficiency anemia Patient has been referred for further evaluation of iron deficiency anemia and to discuss colonoscopy.  Last hemoglobin 10.2 with downtrending MCV, ferritin 14, saturation 7%. On oral iron which is causing some constipation but denies blood in his stool or melena. At some point will need endoscopic  evaluation, will arrange follow-up after patient is discharged from ER/hospital.  If admitted, possible he may undergo endoscopic evaluation at that time.  Morbid obesity BMI 55.68   EMS arrived and patient being transported to the ER for further evaluation and management.   Camie Furbish, PA-C [facility] 05/03/2024, 10:22 AM  Patient Care Team: Kayla Jeoffrey RAMAN, FNP as PCP - General (Family Medicine) Inocencio Soyla Lunger, MD as PCP - Electrophysiology (Cardiology) Rolan Ezra RAMAN, MD as Consulting Physician (Cardiology)

## 2024-05-03 NOTE — ED Notes (Signed)
 CCMD contacted.

## 2024-05-03 NOTE — ED Provider Notes (Signed)
 [place] EMER[facility]ENC[facility] DEPARTMENT AT [facility] Provider Note   CSN: [id number] Arrival date & time: 05/03/24  1043     Patient presents with: Shortness of Breath   Kyle Pearson is a 48 y.o. male with past medical history of CHF, OSA, and a flutter since the emergency department by GI for shortness of breath.  Patient is worsening shortness of breath with exertion for the past 2 months.  He also has significant orthopnea and is unable to lie flat.  Patient reports he has had worsening bilateral lower extremity edema and abdominal distention over that time as well.  He is now only able to ambulate a few feet without significant shortness of breath.  He denies chest pain, nausea, vomiting, and diarrhea.  Patient is taking torsemide  40 mg daily.    Shortness of Breath Associated symptoms: no chest pain        Prior to Admission medications   Medication Sig Start Date End Date Taking? Authorizing Provider  bisacodyl  (DULCOLAX) 5 MG EC tablet Take 10 mg by mouth daily as needed for mild constipation.    [provider]  Cholecalciferol (VITAMIN D3) 50 MCG (2000 UT) capsule Take 1 capsule (2,000 Units total) by mouth daily. 04/27/24   Kayla Jeoffrey RAMAN, FNP  digoxin  (LANOXIN ) 0.125 MG tablet Take 1 tablet (125 mcg total) by mouth daily. NEEDS FOLLOW UP APPOINTMENT FOR MORE REFILLS 02/21/24   Lelon Hamilton T, PA-C  FARXIGA  10 MG TABS tablet Take 1 tablet (10 mg total) by mouth daily before breakfast. 02/21/24   Lelon Hamilton T, PA-C  Iron, Ferrous Sulfate, 325 (65 Fe) MG TABS Take 325 mg by mouth every other day. 04/27/24   Kayla Jeoffrey RAMAN, FNP  metoprolol  succinate (TOPROL -XL) 100 MG 24 hr tablet Take 1.5 tablets (150 mg total) by mouth daily. 04/27/24   Kayla Jeoffrey RAMAN, FNP  potassium chloride  SA (KLOR-CON  M20) 20 MEQ tablet Take 3 tablets (60 mEq total) by mouth daily. 10/08/23   Milford, Harlene HERO, FNP  sacubitril -valsartan  (ENTRESTO ) 49-51 MG Take 1 tablet by mouth 2  (two) times daily.    [provider]  spironolactone  (ALDACTONE ) 25 MG tablet Take 1 tablet (25 mg total) by mouth daily. 03/09/24   Glena Harlene HERO, FNP  thiamine  100 MG tablet TAKE 1 TABLET BY MOUTH EVERY DAY 10/11/20   Milford, Jessica M, FNP  tirzepatide [facility]) 2.5 MG/0.5ML Pen Inject 2.5 mg into the skin once a week. 04/27/24   Kayla Jeoffrey RAMAN, FNP  torsemide  (DEMADEX ) 20 MG tablet Take 3 tablets (60 mg total) by mouth daily. 02/21/24   Lelon Hamilton T, PA-C    Allergies: Raspberry and Penicillins    Review of Systems  Respiratory:  Positive for shortness of breath.   Cardiovascular:  Positive for leg swelling. Negative for chest pain.    Updated Vital Signs BP 105/89   Pulse (!) 102   Temp 98.1 F (36.7 C) (Oral)   Resp (!) 22   Ht 5' 6 (1.676 m)   Wt (!) 156 kg   SpO2 100%   BMI 55.52 kg/m   Physical Exam Vitals and nursing note reviewed.  Constitutional:      General: He is not in acute distress.    Appearance: He is well-developed.  HENT:     Head: Normocephalic and atraumatic.  Eyes:     Conjunctiva/sclera: Conjunctivae normal.  Cardiovascular:     Rate and Rhythm: Regular rhythm. Tachycardia present.  Heart sounds: No murmur heard. Pulmonary:     Effort: Pulmonary effort is normal. Tachypnea present. No respiratory distress.     Breath sounds: Rhonchi present.  Abdominal:     Palpations: Abdomen is soft.     Tenderness: There is no abdominal tenderness.     Comments: Abdominal distention  Musculoskeletal:        General: No swelling.     Cervical back: Neck supple.     Right lower leg: Edema present.     Left lower leg: Edema present.  Skin:    General: Skin is warm and dry.  Neurological:     Mental Status: He is alert.     (all labs ordered are listed, but only abnormal results are displayed) Labs Reviewed  COMPREHENSIVE METABOLIC PANEL WITH GFR - Abnormal; Notable for the following components:      Result Value   Sodium 132  (*)    Potassium 3.1 (*)    Chloride 96 (*)    Glucose, Bld 111 (*)    BUN 24 (*)    Creatinine, Ser 1.51 (*)    Calcium  8.3 (*)    Albumin 3.2 (*)    Total Bilirubin 1.7 (*)    GFR, Estimated 57 (*)    All other components within normal limits  CBC WITH DIFFERENTIAL/PLATELET - Abnormal; Notable for the following components:   WBC 12.2 (*)    RBC 3.96 (*)    Hemoglobin 9.8 (*)    HCT 31.8 (*)    MCH 24.7 (*)    RDW 17.0 (*)    Neutro Abs 8.3 (*)    All other components within normal limits  BRAIN NATRIURETIC PEPTIDE - Abnormal; Notable for the following components:   B Natriuretic Peptide 836.5 (*)    All other components within normal limits  TROPONIN I (HIGH SENSITIVITY) - Abnormal; Notable for the following components:   Troponin I (High Sensitivity) 91 (*)    All other components within normal limits  TROPONIN I (HIGH SENSITIVITY)    EKG: EKG Interpretation Date/Time:  Monday May 03 2024 11:15:05 EST Ventricular Rate:  115 PR Interval:    QRS Duration:  98 QT Interval:  362 QTC Calculation: 500 R Axis:   177  Text Interpretation: Atrial flutter with variable A-V block with premature ventricular or aberrantly conducted complexes Right axis deviation Low voltage QRS Abnormal ECG When compared with ECG of 08-Mar-2024 17:44, PREVIOUS ECG IS PRESENT Confirmed by Lenor Hollering [phone]) on 05/03/2024 1:49:15 PM  Radiology: [facility] Chest 2 View Result Date: 05/03/2024 EXAM: 2 VIEW(S) XRAY OF THE CHEST 05/03/2024 11:34:00 AM COMPARISON: 04/28/2024 CLINICAL HISTORY: sob FINDINGS: LINES, TUBES AND DEVICES: Left chest cardiac defibrillator noted. LUNGS AND PLEURA: No focal pulmonary opacity. No pulmonary edema. No pleural effusion. No pneumothorax. HEART AND MEDIASTINUM: Cardiomegaly. Lead from the left chest cardiac defibrillator terminates over the mediastinum. BONES AND SOFT TISSUES: No acute osseous abnormality. IMPRESSION: 1. No acute cardiopulmonary findings. 2. Stable  enlargement of the cardiac silhouette. Electronically signed by: Waddell Calk MD 05/03/2024 01:13 PM EST RP Workstation: [id number]     Procedures   Medications Ordered in the ED  potassium chloride  SA (KLOR-CON  M) CR tablet 80 mEq (80 mEq Oral Given 05/03/24 1448)  ipratropium-albuterol  (DUONEB) 0.5-2.5 (3) MG/3ML nebulizer solution 3 mL (3 mLs Nebulization Given 05/03/24 1448)  furosemide  (LASIX ) injection 80 mg (80 mg Intravenous Given 05/03/24 1449)  Medical Decision Making Patient is a 48 year old male with past medical history of heart failure presenting for 2 months of dyspnea on exertion and orthopnea with significant bilateral lower extremity edema and abdominal distention.  Patient endorses these progressive symptoms despite p.o. diuresis with adequate urine output.  On exam, patient with 2+ bilateral lower extremity edema, rhonchi in both lung fields, and abdominal distention.  Patient is speaking in full sentences and comfortable at rest.  Differential diagnosis includes but limited to heart failure exacerbation, AKI, renal dysfunction, hyperkalemia, pneumonia, pneumothorax  EKG reveals atrial flutter without evidence of acute ischemia or STEMI  Labs significant for hyponatremia, hypokalemia, elevated creatinine consistent with AKI.  CBC with anemia, hemoglobin downtrending, no signs or symptoms of active bleeding.  BNP elevated in the 800s consistent with hypervolemia and troponin elevated at 91 likely in the setting of demand ischemia  Chest x-ray with evidence of pulmonary edema  Patient presentation is most consistent with significant hypervolemia in the setting of an acute heart failure exacerbation.  Patient is currently saturating appropriately on room air and not in acute respiratory distress while at rest, BiPAP was not required at this time.  Patient's potassium was repleted orally and he was given a dose of IV Lasix  for  diuresis.  Patient requires admission for continued treatment of acute heart failure exacerbation.  Hospitalist was consulted who agreed to admit this patient.  Patient was admitted to their service stable condition.   Amount and/or Complexity of Data Reviewed External Data Reviewed: labs and notes. Labs: ordered. Radiology: ordered and independent interpretation performed. ECG/medicine tests: ordered and independent interpretation performed.  Risk Prescription drug management. Decision regarding hospitalization.        Final diagnoses:  SOB (shortness of breath)  Dyspnea on exertion  Orthopnea  Acute on chronic congestive heart failure, unspecified heart failure type [facility])    ED Discharge Orders     None          Sharlet Dowdy, MD 05/03/24 1449    Lenor Hollering, MD 05/04/24 [phone]

## 2024-05-03 NOTE — Consult Note (Signed)
 Advanced Heart Failure Team Consult Note  Primary Physician: Kayla Jeoffrey RAMAN, FNP HF Cardiologist:  Dr. Rolan  Reason for Consultation: Acute systolic heart failure HPI:    Kyle Pearson is seen today for evaluation of acute decompensated heart failure at the request of Dr. Lenor. 48 y.o. with history of tobacco and alcohol use, diagnosed with CHF in 10/21.     Prior to 11/21, no past medical history. He used to smoke less than 1/2 pack per day and drank around a 6 pack of beer on the weekend.  He denies family history of heart disease but reported his father used to drink alcohol heavily but quit as well.  He states in 03/16/2020 he started to develop a cough and didn't want to mix medication with alcohol so he stopped drinking and started to take cough medication.  He noticed one day a very severe pain on his left side and pain with swallowing.  He was diagnosed with peritonsillar abscess and placed on clindamycin  (300 mg po QID x 10 days) and decadron .  He reports feeling better throat wise but noticed increase swelling to his abdomen and difficulty breathing.  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 He went to [facility] on 04/24/2020 for shortness of breath, abdominal distention and left sided chest pain. 2D echo showed EF 15-20% with severe LVH, BNP 562 and troponin 54.  He was started on lisinopril  5 mg, toprol -XL 25 mg PO daily and lasix  IV.  He also had an abdominal ultrasound which showed no significant asites but note mild liver cirrhosis with mild gallbladder wall thickening. He was discharged home on 40 mg PO lasix , KCL 20 mEq, lisinopril  5 and toprol -XL 25.     He was admitted 05/29/20-06/06/20 with marked volume overload. Repeat echo revealed EF < 20%, severe decrease function with global hypokinesis, grade III DD, dilated LA/RA and small pericardial effusion. Diuresed with IV lasix  and set for cath. Cath showed  elevated filling pressures and low output, no significant CAD. Milrinone  was added and he continued to diurese on IV. Milrinone  later weaned off with stable co-ox. Started on GDMT. Had cMRI with severely reduced EF and non-coronary pattern extensive scar. Due to concern for ectopy from scar, he was discharged with Lifevest. Diuresed 35 lbs. Discharge weight 255.6 lbs.   Echo 3/22 showed that EF remains < 20%, moderate LVE, moderate RVE, mildly decreased RV systolic function, IVC dilated.  CPX in 4/22 showed only a mild HF limitation.    Admitted 11/2020 with volume overload.    [employer] ICD was placed.    He had an episode of VT vs 1:1 atrial flutter in 11/23, had been out of meds x 3 days at the time.     Echo 1/25 showed EF 20-25%, G3DD, RV mildly reduced.   Seen in ED 9/25 with CP. Out of several meds x 3 weeks. ECG and hs-Troponin reassuring.   Seen in clinic for follow-up last month. Had been off multiple meds d/t cost. Restarted on spiro. ReDS 32%. No showed subsequent follow-up.   Presented to GI office today to discuss colonoscopy. He was found to be significantly short of breath with edema and weight gain. Normotensive with mild tachycardia on arrival. ECG showed atrial flutter 112 bpm with PVC. Previous ICD interrogation 04/17/24 noted AF, it was recommended that he be seen in AF clinic to discuss symptoms and start  OAC. He was also seen by PCP a week ago and noted to be in atrial flutter on ECG, his metoprolol  was increased to 150 mg daily. AC was held due to concerns for GIB and anemia, and he was referred to [facility] cardiology and GI. Labs notable for NA 132, K 3.1, BUN/Cr 24/1.51, BNP 836, hs-trop 91>92, WBC 12.2, hgb 9.8. CXR with cardiomegaly and mild pulmonary edema.   Today, he reports that he was taking all of his medications, however when asked which medications. He reports that his PCP stopped his entresto  because they started farxiga  (is not the case based on PCP notes).  He also reports that he was so-so with his diuretic tablets. Appears that compliance remains an issue, does not seem to understand the purpose for his medications. Has been having orthopnea and shortness of breath for a month. Bloating making him constipated and giving him early satiety. Remains uncomfortable.   Home Medications Prior to Admission medications   Medication Sig Start Date End Date Taking? Authorizing Provider  bisacodyl  (DULCOLAX) 5 MG EC tablet Take 10 mg by mouth daily as needed for mild constipation.    [provider]  Cholecalciferol (VITAMIN D3) 50 MCG (2000 UT) capsule Take 1 capsule (2,000 Units total) by mouth daily. 04/27/24   Kayla Jeoffrey RAMAN, FNP  digoxin  (LANOXIN ) 0.125 MG tablet Take 1 tablet (125 mcg total) by mouth daily. NEEDS FOLLOW UP APPOINTMENT FOR MORE REFILLS 02/21/24   Lelon Hamilton T, PA-C  FARXIGA  10 MG TABS tablet Take 1 tablet (10 mg total) by mouth daily before breakfast. 02/21/24   Lelon Hamilton T, PA-C  Iron, Ferrous Sulfate, 325 (65 Fe) MG TABS Take 325 mg by mouth every other day. 04/27/24   Kayla Jeoffrey RAMAN, FNP  metoprolol  succinate (TOPROL -XL) 100 MG 24 hr tablet Take 1.5 tablets (150 mg total) by mouth daily. 04/27/24   Kayla Jeoffrey RAMAN, FNP  potassium chloride  SA (KLOR-CON  M20) 20 MEQ tablet Take 3 tablets (60 mEq total) by mouth daily. 10/08/23   Milford, Harlene HERO, FNP  sacubitril -valsartan  (ENTRESTO ) 49-51 MG Take 1 tablet by mouth 2 (two) times daily.    [provider]  spironolactone  (ALDACTONE ) 25 MG tablet Take 1 tablet (25 mg total) by mouth daily. 03/09/24   Glena Harlene HERO, FNP  thiamine  100 MG tablet TAKE 1 TABLET BY MOUTH EVERY DAY 10/11/20   Milford, Jessica M, FNP  tirzepatide [facility]) 2.5 MG/0.5ML Pen Inject 2.5 mg into the skin once a week. 04/27/24   Kayla Jeoffrey RAMAN, FNP  torsemide  (DEMADEX ) 20 MG tablet Take 3 tablets (60 mg total) by mouth daily. 02/21/24   Lelon Hamilton DASEN, PA-C    Past Medical History: Past  Medical History:  Diagnosis Date   Atrial flutter (HCC)    CHF (congestive heart failure) (HCC)    OSA (obstructive sleep apnea)    Pacemaker    Pulmonary hypertensive venous disease (HCC)    Type 2 diabetes mellitus with hyperglycemia, without long-term current use of insulin [facility])     Past Surgical History: Past Surgical History:  Procedure Laterality Date   RIGHT HEART CATH N/A 12/08/2020   Procedure: RIGHT HEART CATH;  Surgeon: Rolan Ezra RAMAN, MD;  Location: [facility] INVASIVE CV LAB;  Service: Cardiovascular;  Laterality: N/A;   RIGHT/LEFT HEART CATH AND CORONARY ANGIOGRAPHY N/A 06/01/2020   Procedure: RIGHT/LEFT HEART CATH AND CORONARY ANGIOGRAPHY;  Surgeon: Rolan Ezra RAMAN, MD;  Location: [facility] INVASIVE CV LAB;  Service: Cardiovascular;  Laterality:  N/A;   SUBQ ICD IMPLANT N/A 01/26/2021   Procedure: SUBQ ICD IMPLANT;  Surgeon: Inocencio Soyla Lunger, MD;  Location: [facility] INVASIVE CV LAB;  Service: Cardiovascular;  Laterality: N/A;    Family History: Family History  Problem Relation Age of Onset   Diabetes Mother    Stroke Father    Diabetes Sister     Social History: Social History   Socioeconomic History   Marital status: Married    Spouse name: Not on file   Number of children: Not on file   Years of education: Not on file   Highest education level: Not on file  Occupational History   Not on file  Tobacco Use   Smoking status: Former    Current packs/day: 0.00    Types: Cigarettes    Quit date: 2020    Years since quitting: 5.8   Smokeless tobacco: Never  Vaping Use   Vaping status: Never Used  Substance and Sexual Activity   Alcohol use: Not Currently   Drug use: Not Currently   Sexual activity: Not Currently  Other Topics Concern   Not on file  Social History Narrative   Lives with spouse   Social Drivers of Health   Financial Resource Strain: Not on file  Food Insecurity: Not on file  Transportation Needs: Not on file  Physical Activity: Not on file   Stress: Not on file  Social Connections: Not on file   Allergies:  Allergies  Allergen Reactions   Raspberry Anaphylaxis   Penicillins Rash    Reaction: Childhood   Objective:    Vital Signs:   Temp:  [98.1 F (36.7 C)] 98.1 F (36.7 C) (11/03 1448) Pulse Rate:  [47-114] 108 (11/03 1547) Resp:  [16-33] 17 (11/03 1547) BP: (104-119)/(54-89) 119/81 (11/03 1547) SpO2:  [95 %-100 %] 98 % (11/03 1547) Weight:  [156 kg] 156 kg (11/03 1057)   Weight change: Filed Weights   05/03/24 1057  Weight: (!) 156 kg   Intake/Output:  No intake or output data in the 24 hours ending 05/03/24 1549   Physical Exam    General: Acutely-ill appearing. Appears short of breath Cardiac: JVP difficult to assess. S1 and S2 present. No murmurs  Abdomen: Firm, non-tender, distended.  Extremities: Warm and dry.  2+ BLE edema.  Neuro: Alert and oriented x3. Affect pleasant. Moves all extremities without difficulty.  Telemetry   AFL 100s (personally reviewed)  Labs   Basic Metabolic Panel: Recent Labs  Lab 05/03/24 1104  NA 132*  K 3.1*  CL 96*  CO2 25  GLUCOSE 111*  BUN 24*  CREATININE 1.51*  CALCIUM  8.3*   Liver Function Tests: Recent Labs  Lab 05/03/24 1104  AST 28  ALT 20  ALKPHOS 63  BILITOT 1.7*  PROT 7.4  ALBUMIN 3.2*   CBC: Recent Labs  Lab 05/03/24 1104  WBC 12.2*  NEUTROABS 8.3*  HGB 9.8*  HCT 31.8*  MCV 80.3  PLT 331   BNP (last 3 results) Recent Labs    10/08/23 1006 03/08/24 1749 05/03/24 1104  BNP 181.5* 137.8* 836.5*   Medications:    Current Medications:   Infusions:   Assessment/Plan   Acute on chronic systolic CHF: Nonischemic cardiomyopathy. [employer] ICD. Symptomatic since 9/21 (after episode of peri-tonsillar abscess).  He drinks ETOH but does not seem to describe heavy enough ETOH to cause profound cardiomyopathy (though he did have mild cirrhosis on abdominal US  at [facility]). No FH of cardiomyopathy. Echo 11/21 with  EF < 20%  with severe LV dilation, moderate RVE with severely decreased systolic function, severe biatrial enlargement, mild MR. [facility] 12/21 showed no coronary disease, markedly elevated filling pressures, and low cardiac output. Cardiac MRI in 11/21 showed severely dilated LV with diffuse hypokinesis, EF 17%, mod dilated RV with EF 20%, diffuse mid-wall LGE in septum, inferior wall & inferolateral wall.  LGE pattern suggests possible prior myocarditis. Echo in 3/22 showed that EF remains < 20% with mild RV dysfunction. CPX in 4/22 showed only a mild HF limitation.  Trypanosoma cruzi antibodies were negative (had h/o neurocysticercosis), probably not cardiomyopathy related to trypanosomiasis. Echo 1/25 showed EF 20-25%, G3DD, RV mildly reduced.  - Admitted with NYHA IV symptoms. Compliance appears to be an issue. Had been off some medications. Suspect heart failure worsened in the setting of AFL.  - Severely volume overloaded on exam. Abd concerning for ascites, will abd US  - give IV Lasix  80 mg, followed by gtt at 15/hr - restart farxiga  10 mg daily - restart spiro 25 mg daily - continue digoxin  0.125 daily.  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 remote monitoring 04/17/24; see on ECG by PCP - AC initially held d/t concern for GIB; send FOBT - start heparin  gtt, if hgb remains stable, switch to eliquis - start amio 30/hr (no bolus) for rate control - decrease toprol  to 50 mg daily with severe hypervolemia - will need eventual TEE/DCCV once euvolemic  Anemia - PCP referred to GI due to concern for GIB - hgb 9.8 today; watch on heparin  - check FOBT - send iron studies  Constipation - suspect related to increased intra-abdominal pressure - check abd US  - start sennakot and PRN dulcolax/myralax  ETOH:   - He did have early cirrhosis on abdominal US  at [facility].  However, with obesity could be MASH. He is cutting  back his beer intake - Discussed cessation.  OSA: Moderate by sleep study 08/2020. AHI 31.1/hr - Needs to wear CPAP  Obesity:  - Body mass index is 55.52 kg/m. - on mounjaro in OP  SDOH:  - He is insured and has transportation. - Farxiga  $45/90 dayEntresto  $45/90 (can use co-pay cards for each)   Length of Stay: 0  Kyle Emel, NP  05/03/2024, 3:49 PM  Advanced Heart Failure Team Pager [phone] (M-F; 7a - 5p)  Please contact [facility] for night-coverage after hours (4p -7a ) and weekends on [email or web address]

## 2024-05-03 NOTE — ED Notes (Signed)
 Pt arrived to room at this time. CCMD notified to place pt on monitor. Pt currently utilizing urinal.

## 2024-05-03 NOTE — ED Notes (Signed)
 Patient refused second troponin. RN Crystal B. Aware. KIT

## 2024-05-03 NOTE — Progress Notes (Signed)
 PHARMACY - ANTICOAGULATION CONSULT NOTE  Pharmacy Consult for Heparin  Indication: atrial fibrillation  Allergies  Allergen Reactions   Raspberry Anaphylaxis   Penicillins Rash    Reaction: Childhood    Patient Measurements: Height: 5' 6 (167.6 cm) Weight: (!) 156 kg (344 lb) IBW/kg (Calculated) : 63.8 HEPARIN  DW (KG): 102.6  Vital Signs: Temp: 98.1 F (36.7 C) (11/03 1448) Temp Source: Oral (11/03 1448) BP: 112/81 (11/03 1630) Pulse Rate: 60 (11/03 1630)  Labs: Recent Labs    05/03/24 1104 05/03/24 1304  HGB 9.8*  --   HCT 31.8*  --   PLT 331  --   CREATININE 1.51*  --   TROPONINIHS 91* 92*    Estimated Creatinine Clearance: 86.1 mL/min (A) (by C-G formula based on SCr of 1.51 mg/dL (H)).   Medical History: Past Medical History:  Diagnosis Date   Atrial flutter (HCC)    CHF (congestive heart failure) (HCC)    OSA (obstructive sleep apnea)    Pacemaker    Pulmonary hypertensive venous disease (HCC)    Type 2 diabetes mellitus with hyperglycemia, without long-term current use of insulin (HCC)     Medications:  Infusions:   amiodarone     amiodarone     furosemide  (LASIX ) 200 mg in dextrose  5 % 100 mL (2 mg/mL) infusion      Assessment: Patient is a 48 year old male admitted for CHF exacerbation and Afib. Per notes and patient, he was found to be in AF last month in clinic, but [facility] was held due to concern for GI bleed with anemia. Pharmacy is consulted for heparin .   Hgb 9.8 trending down. Fecal occult blood test ordered. PLT wnl.   Goal of Therapy:  Heparin  level 0.3-0.7 units/ml Monitor platelets by anticoagulation protocol: Yes   Plan:  Start heparin  infusion at 1500 units/hr No bolus due to concern for GIB.  HL in 6 hours Daily HL and CBC.   Cassie Henkels M Teneisha Gignac 05/03/2024,4:47 PM

## 2024-05-03 NOTE — ED Provider Triage Note (Signed)
 Emergency Medicine Provider Triage Evaluation Note  Kyle Pearson , a 48 y.o. male  was evaluated in triage.  Pt complains of shortness of breath..  Review of Systems  Positive: Shortness of breath Negative: Chest pain  Physical Exam  BP 113/74 (BP Location: Right Wrist)   Pulse 69   Temp 98.1 F (36.7 C) (Oral)   Resp (!) 26   Ht 5' 6 (1.676 m)   Wt (!) 156 kg   SpO2 99%   BMI 55.52 kg/m  Diffuse harsh breath sounds.  Does have peripheral edema.  Medical Decision Making  Medically screening exam initiated at 11:04 AM.  Appropriate orders placed.  Kyle Pearson was informed that the remainder of the evaluation will be completed by another provider, this initial triage assessment does not replace that evaluation, and the importance of remaining in the ED until their evaluation is complete.  Patient shortness of breath.  History of CHF.  States he is up around 40 pounds.  More dyspnea.  Also has had worsening anemia.  Will need further workup.   Kyle Lot, MD 05/03/24 1104

## 2024-05-04 ENCOUNTER — Other Ambulatory Visit: Payer: Self-pay

## 2024-05-04 ENCOUNTER — Inpatient Hospital Stay (HOSPITAL_COMMUNITY)

## 2024-05-04 DIAGNOSIS — I5023 Acute on chronic systolic (congestive) heart failure: Secondary | ICD-10-CM

## 2024-05-04 LAB — GLUCOSE, CAPILLARY
Glucose-Capillary: 152 mg/dL — ABNORMAL HIGH (ref 70–99)
Glucose-Capillary: 132 mg/dL — ABNORMAL HIGH (ref 70–99)

## 2024-05-04 LAB — BASIC METABOLIC PANEL WITH GFR
Anion gap: 14 (ref 5–15)
BUN: 27 mg/dL — ABNORMAL HIGH (ref 6–20)
CO2: 25 mmol/L (ref 22–32)
Calcium: 8.6 mg/dL — ABNORMAL LOW (ref 8.9–10.3)
Chloride: 96 mmol/L — ABNORMAL LOW (ref 98–111)
Creatinine, Ser: 1.59 mg/dL — ABNORMAL HIGH (ref 0.61–1.24)
GFR, Estimated: 54 mL/min — ABNORMAL LOW (ref >60)
Glucose, Bld: 124 mg/dL — ABNORMAL HIGH (ref 70–99)
Potassium: 3.5 mmol/L (ref 3.5–5.1)
Sodium: 135 mmol/L (ref 135–145)

## 2024-05-04 LAB — CBC
HCT: 33.8 % — ABNORMAL LOW (ref 39.0–52.0)
Hemoglobin: 10.2 g/dL — ABNORMAL LOW (ref 13.0–17.0)
MCH: 24.6 pg — ABNORMAL LOW (ref 26.0–34.0)
MCHC: 30.2 g/dL (ref 30.0–36.0)
MCV: 81.4 fL (ref 80.0–100.0)
Platelets: 350 K/uL (ref 150–400)
RBC: 4.15 MIL/uL — ABNORMAL LOW (ref 4.22–5.81)
RDW: 17.5 % — ABNORMAL HIGH (ref 11.5–15.5)
WBC: 12.4 K/uL — ABNORMAL HIGH (ref 4.0–10.5)
nRBC: 0 % (ref 0.0–0.2)

## 2024-05-04 LAB — HEPARIN LEVEL (UNFRACTIONATED)
Heparin Unfractionated: <0.1 [IU]/mL — ABNORMAL LOW (ref 0.30–0.70)
Heparin Unfractionated: <0.1 [IU]/mL — ABNORMAL LOW (ref 0.30–0.70)
Heparin Unfractionated: <0.1 [IU]/mL — ABNORMAL LOW (ref 0.30–0.70)

## 2024-05-04 LAB — ECHOCARDIOGRAM COMPLETE
Area-P 1/2: 1.43 cm2
Calc EF: 19.1 %
Est EF: <20
Height: 66 in
S' Lateral: 7.5 cm
Single Plane A2C EF: 9.4 %
Single Plane A4C EF: 27.5 %
Weight: 5504 [oz_av]

## 2024-05-04 LAB — IRON AND TIBC
Iron: 30 ug/dL — ABNORMAL LOW (ref 45–182)
Saturation Ratios: 6 % — ABNORMAL LOW (ref 17.9–39.5)
TIBC: 476 ug/dL — ABNORMAL HIGH (ref 250–450)
UIBC: 446 ug/dL

## 2024-05-04 LAB — CBG MONITORING, ED
Glucose-Capillary: 135 mg/dL — ABNORMAL HIGH (ref 70–99)
Glucose-Capillary: 173 mg/dL — ABNORMAL HIGH (ref 70–99)

## 2024-05-04 LAB — DIGOXIN LEVEL: Digoxin Level: <0.6 ng/mL — ABNORMAL LOW (ref 0.8–2.0)

## 2024-05-04 LAB — FERRITIN: Ferritin: 15 ng/mL — ABNORMAL LOW (ref 24–336)

## 2024-05-04 MED ORDER — SODIUM CHLORIDE 0.9% FLUSH
10.0000 mL | Freq: Two times a day (BID) | INTRAVENOUS | Status: DC
  Administered 2024-05-04 – 2024-05-06 (×4): 10 mL
  Administered 2024-05-06: 20 mL
  Administered 2024-05-07 – 2024-05-10 (×5): 10 mL
  Administered 2024-05-11: 3 mL
  Administered 2024-05-12: 10 mL

## 2024-05-04 MED ORDER — HEPARIN (PORCINE) 25000 UT/250ML-% IV SOLN
2300.0000 [IU]/h | INTRAVENOUS | Status: DC
  Administered 2024-05-04 – 2024-05-05 (×2): 1600 [IU]/h via INTRAVENOUS
  Administered 2024-05-06: 2300 [IU]/h via INTRAVENOUS
  Administered 2024-05-06: 2100 [IU]/h via INTRAVENOUS
  Administered 2024-05-07: 2300 [IU]/h via INTRAVENOUS
  Filled 2024-05-04 (×6): qty 250

## 2024-05-04 MED ORDER — CHLORHEXIDINE GLUCONATE CLOTH 2 % EX PADS
6.0000 | MEDICATED_PAD | Freq: Every day | CUTANEOUS | Status: DC
  Administered 2024-05-04 – 2024-05-12 (×9): 6 via TOPICAL

## 2024-05-04 MED ORDER — SODIUM CHLORIDE 0.9% FLUSH
10.0000 mL | INTRAVENOUS | Status: DC | PRN
  Administered 2024-05-11: 10 mL

## 2024-05-04 MED ORDER — PERFLUTREN LIPID MICROSPHERE
1.0000 mL | INTRAVENOUS | Status: AC | PRN
  Administered 2024-05-04: 2 mL via INTRAVENOUS

## 2024-05-04 MED ORDER — POLYETHYLENE GLYCOL 3350 17 G PO PACK
17.0000 g | PACK | Freq: Two times a day (BID) | ORAL | Status: DC
  Administered 2024-05-04 – 2024-05-12 (×13): 17 g via ORAL
  Filled 2024-05-04 (×16): qty 1

## 2024-05-04 NOTE — ED Notes (Signed)
 Introduced self to patient at this time. Patient is resting in bed with visible chest rise and fall. The call light is in reach. There are no further requests at this time.   urine output noted.

## 2024-05-04 NOTE — ED Notes (Signed)
 Morning medicines delayed due to excessive amount of tasks to be completed to catch up on overnight tasks which were not completed for multiple patients on my assignment.

## 2024-05-04 NOTE — Progress Notes (Signed)
 Peripherally Inserted Central Catheter Placement  The IV Nurse has discussed with the patient and/or persons authorized to consent for the patient, the purpose of this procedure and the potential benefits and risks involved with this procedure.  The benefits include less needle sticks, lab draws from the catheter, and the patient may be discharged home with the catheter. Risks include, but not limited to, infection, bleeding, blood clot (thrombus formation), and puncture of an artery; nerve damage and irregular heartbeat and possibility to perform a PICC exchange if needed/ordered by physician.  Alternatives to this procedure were also discussed.  Bard Power PICC patient education guide, fact sheet on infection prevention and patient information card has been provided to patient /or left at bedside.    PICC Placement Documentation  PICC Double Lumen 05/04/24 Right Cephalic 43 cm 0 cm (Active)  Indication for Insertion or Continuance of Line Vasoactive infusions 05/04/24 1755  Exposed Catheter (cm) 0 cm 05/04/24 1755  Site Assessment Clean, Dry, Intact 05/04/24 1755  Lumen #1 Status Flushed;Saline locked;Blood return noted 05/04/24 1755  Lumen #2 Status Flushed;Saline locked;Blood return noted 05/04/24 1755  Dressing Type Transparent;Securing device 05/04/24 1755  Dressing Status Antimicrobial disc/dressing in place 05/04/24 1755  Line Care Connections checked and tightened 05/04/24 1755  Line Adjustment (NICU/IV Team Only) No 05/04/24 1755  Dressing Intervention New dressing;Adhesive placed at insertion site (IV team only) 05/04/24 1755  Dressing Change Due 05/11/24 05/04/24 1755       Hobert Benders 05/04/2024, 5:57 PM

## 2024-05-04 NOTE — Progress Notes (Signed)
 PROGRESS NOTE    Kyle Pearson  FMW:[id number] DOB: Jul 21, 1975 DOA: 05/03/2024 PCP: Kayla Jeoffrey RAMAN, FNP  47/M, morbidly obese with chronic systolic CHF/NICM, paroxysmal A-fib, EtOH abuse, liver cirrhosis, OSA, presented to GI office 11/3 to discuss colonoscopy, he was noted to be significantly volume overloaded and  in atrial flutter with RVR, sent to the ED, questionable compliance, and still does not have his CPAP. - In the ED A-fib RVR, blood pressure in the 1 teens, BNP 836, troponin 91, creatinine 1.5, sodium 132, WBC 12.2   Subjective: -Uncomfortable, swollen  Assessment and Plan:  Acute on chronic systolic CHF, NICM - Last echo 1/25 noted EF 20-25, grade 3 DD, mildly reduced RV, LHC 12/21 noted no CAD - Significantly volume overloaded on admission this time, concern for compliance with medications - Heart failure team following, currently on Lasix  gtt., - Continue Aldactone , Farxiga  and digoxin  - Toprol  dose decreased  Atrial flutter with RVR - Recently anticoagulation held due to concern for GI bleed - Currently on heparin  gtt. and Amio gtt. - Plan for cardioversion when volume status is improved  Severe iron deficiency anemia - Add IV iron, monitor for GI bleed on anticoagulation  Liver cirrhosis EtOH abuse - Ultrasound now with no ascites - Diuretics as noted above, EtOH cessation counseled - Thiamine , multivitamin, monitor for withdrawal  Moderate OSA - Reports that his CPAP is yet to be delivered - Order CPAP nightly, TOC consult for home CPAP  Morbid obesity, BMI is 55.5 - On Mounjaro now  Constipation Now on laxatives, monitor   DVT prophylaxis: hep gtt Code Status: Full Family Communication: Disposition Plan:      Objective: Vitals:   05/04/24 0600 05/04/24 0615 05/04/24 0630 05/04/24 0740  BP:    (!) 130/97  Pulse: (!) 112 (!) 109 (!) 112 100  Resp: (!) 21 (!) 23 (!) 27 17  Temp:    97.6 F (36.4 C)  TempSrc:    Oral  SpO2: 100%  97% 97% 99%  Weight:      Height:       No intake or output data in the 24 hours ending 05/04/24 1026 Filed Weights   05/03/24 1057  Weight: (!) 156 kg    Examination:  General exam: Appears calm and comfortable, AO x 3 Respiratory system: Decreased at the bases Cardiovascular system: S1 & S2 heard, irregular Abd: nondistended, soft and nontender.Normal bowel sounds heard. Central nervous system: Alert and oriented. No focal neurological deficits. Extremities: 2+ edema Skin: No rashes Psychiatry:  Mood & affect appropriate.     Data Reviewed:   CBC: Recent Labs  Lab 05/03/24 1104 05/04/24 0256  WBC 12.2* 12.4*  NEUTROABS 8.3*  --   HGB 9.8* 10.2*  HCT 31.8* 33.8*  MCV 80.3 81.4  PLT 331 350   Basic Metabolic Panel: Recent Labs  Lab 05/03/24 1104 05/04/24 0256  NA 132* 135  K 3.1* 3.5  CL 96* 96*  CO2 25 25  GLUCOSE 111* 124*  BUN 24* 27*  CREATININE 1.51* 1.59*  CALCIUM  8.3* 8.6*   GFR: Estimated Creatinine Clearance: 81.8 mL/min (A) (by C-G formula based on SCr of 1.59 mg/dL (H)). Liver Function Tests: Recent Labs  Lab 05/03/24 1104  AST 28  ALT 20  ALKPHOS 63  BILITOT 1.7*  PROT 7.4  ALBUMIN 3.2*   No results for input(s): LIPASE, AMYLASE in the last 168 hours. No results for input(s): AMMONIA in the last 168 hours. Coagulation Profile: No results  for input(s): INR, PROTIME in the last 168 hours. Cardiac Enzymes: No results for input(s): CKTOTAL, CKMB, CKMBINDEX, TROPONINI in the last 168 hours. BNP (last 3 results) No results for input(s): PROBNP in the last 8760 hours. HbA1C: No results for input(s): HGBA1C in the last 72 hours. CBG: Recent Labs  Lab 05/03/24 1615 05/03/24 1816 05/04/24 0253 05/04/24 0938  GLUCAP 99 106* 135* 173*   Lipid Profile: No results for input(s): CHOL, HDL, LDLCALC, TRIG, CHOLHDL, LDLDIRECT in the last 72 hours. Thyroid  Function Tests: No results for input(s): TSH,  T4TOTAL, FREET4, T3FREE, THYROIDAB in the last 72 hours. Anemia Panel: Recent Labs    05/04/24 0256  FERRITIN 15*  TIBC 476*  IRON 30*   Urine analysis:    Component Value Date/Time   COLORURINE YELLOW 04/27/2024 1215   APPEARANCEUR CLEAR 04/27/2024 1215   LABSPEC 1.015 04/27/2024 1215   PHURINE 6.5 04/27/2024 1215   GLUCOSEU 3+ (A) 04/27/2024 1215   HGBUR TRACE (A) 04/27/2024 1215   BILIRUBINUR NEGATIVE 03/13/2007 1222   KETONESUR NEGATIVE 04/27/2024 1215   PROTEINUR 3+ (A) 04/27/2024 1215   UROBILINOGEN 0.2 03/13/2007 1222   NITRITE NEGATIVE 04/27/2024 1215   LEUKOCYTESUR NEGATIVE 04/27/2024 1215   Sepsis Labs: @LABRCNTIP (procalcitonin:4,lacticidven:4)  )No results found for this or any previous visit (from the past 240 hours).   Radiology Studies: US  ASCITES (ABDOMEN LIMITED) Result Date: 05/03/2024 CLINICAL DATA:  [id number] Ascites [id number] EXAM: LIMITED ABDOMEN ULTRASOUND FOR ASCITES TECHNIQUE: Limited ultrasound survey for ascites was performed in all four abdominal quadrants. COMPARISON:  None Available. FINDINGS: No ascites. IMPRESSION: No ascites. Electronically Signed   By: Rogelia Myers M.D.   On: 05/03/2024 17:36   DG Chest 2 View Result Date: 05/03/2024 EXAM: 2 VIEW(S) XRAY OF THE CHEST 05/03/2024 11:34:00 AM COMPARISON: 04/28/2024 CLINICAL HISTORY: sob FINDINGS: LINES, TUBES AND DEVICES: Left chest cardiac defibrillator noted. LUNGS AND PLEURA: No focal pulmonary opacity. No pulmonary edema. No pleural effusion. No pneumothorax. HEART AND MEDIASTINUM: Cardiomegaly. Lead from the left chest cardiac defibrillator terminates over the mediastinum. BONES AND SOFT TISSUES: No acute osseous abnormality. IMPRESSION: 1. No acute cardiopulmonary findings. 2. Stable enlargement of the cardiac silhouette. Electronically signed by: Waddell Calk MD 05/03/2024 01:13 PM EST RP Workstation: [id number]     Scheduled Meds:  dapagliflozin  propanediol  10 mg Oral Daily    digoxin   125 mcg Oral Daily   insulin aspart  0-15 Units Subcutaneous TID WC   insulin aspart  0-5 Units Subcutaneous QHS   metoprolol  succinate  50 mg Oral Daily   senna-docusate  1 tablet Oral Daily   sodium chloride  flush  3 mL Intravenous Q12H   spironolactone   25 mg Oral Daily   thiamine   100 mg Oral Daily   Continuous Infusions:  amiodarone 30 mg/hr (05/04/24 0011)   furosemide  (LASIX ) 200 mg in dextrose  5 % 100 mL (2 mg/mL) infusion 15 mg/hr (05/04/24 0903)     LOS: 1 day    Time spent:    Sigurd Pac, MD [facility]   05/04/2024, 10:26 AM

## 2024-05-04 NOTE — Progress Notes (Signed)
 PHARMACY - ANTICOAGULATION CONSULT NOTE  Pharmacy Consult for Heparin  Indication: atrial fibrillation  Allergies  Allergen Reactions   Raspberry Anaphylaxis   Penicillins Rash    Reaction: Childhood    Patient Measurements: Height: 5' 6 (167.6 cm) Weight: (!) 156 kg (344 lb) IBW/kg (Calculated) : 63.8 HEPARIN  DW (KG): 102.6  Vital Signs: Temp: 97.8 F (36.6 C) (11/04 1153) Temp Source: Oral (11/04 1153) BP: 132/94 (11/04 1153) Pulse Rate: 105 (11/04 1153)  Labs: Recent Labs    05/03/24 1104 05/03/24 1304 05/04/24 0256 05/04/24 1059  HGB 9.8*  --  10.2*  --   HCT 31.8*  --  33.8*  --   PLT 331  --  350  --   HEPARINUNFRC  --   --  <0.10* <0.10*  CREATININE 1.51*  --  1.59*  --   TROPONINIHS 91* 92*  --   --     Estimated Creatinine Clearance: 81.8 mL/min (A) (by C-G formula based on SCr of 1.59 mg/dL (H)).   Medical History: Past Medical History:  Diagnosis Date   Atrial flutter (HCC)    CHF (congestive heart failure) (HCC)    OSA (obstructive sleep apnea)    Pacemaker    Pulmonary hypertensive venous disease (HCC)    Type 2 diabetes mellitus with hyperglycemia, without long-term current use of insulin (HCC)     Medications:  Infusions:   amiodarone 30 mg/hr (05/04/24 0011)   furosemide  (LASIX ) 200 mg in dextrose  5 % 100 mL (2 mg/mL) infusion 15 mg/hr (05/04/24 0903)   heparin       Assessment: Patient is a 48 year old male admitted for CHF exacerbation and Afib. Per notes and patient, he was found to be in AF last month in clinic, but [facility] was held due to concern for GI bleed with anemia. Pharmacy is consulted for heparin .   Heparin  was never started in the ED due to concern of GIB. After discussion with MDs to start but it was never started. Hgb 10.2, plt wnl. Since we are going to bolus, we will start at a higher rate.   Goal of Therapy:  Heparin  level 0.3-0.7 units/ml Monitor platelets by anticoagulation protocol: Yes   Plan:  Heparin  1600  units/hr HL in 6 hours Daily HL and CBC.   Sergio Batch, PharmD, BCIDP, AAHIVP, CPP Infectious Disease Pharmacist 05/04/2024 1:11 PM

## 2024-05-04 NOTE — Progress Notes (Signed)
  Echocardiogram 2D Echocardiogram has been performed.  Norleen ORN [facility] 05/04/2024, 10:41 AM

## 2024-05-04 NOTE — ED Notes (Signed)
 Urine output noted.

## 2024-05-04 NOTE — Consult Note (Addendum)
 Advanced Heart Failure Rounding Note  Cardiologist: None  Chief Complaint: Heart Failure  Subjective:   11-3 Marked volume overload and A flutter RVR. started on amio drip + lasix  drip.   Complaining leg swelling and shortness of breath.    Objective:   Weight Range: (!) 156 kg Body mass index is 55.52 kg/m.   Vital Signs:   Temp:  [97.3 F (36.3 C)-98.3 F (36.8 C)] 97.6 F (36.4 C) (11/04 0740) Pulse Rate:  [47-126] 100 (11/04 0740) Resp:  [15-33] 17 (11/04 0740) BP: (97-130)/(54-101) 130/97 (11/04 0740) SpO2:  [90 %-100 %] 99 % (11/04 0740)    Weight change: Filed Weights   05/03/24 1057  Weight: (!) 156 kg    Intake/Output:  No intake or output data in the 24 hours ending 05/04/24 1107    Physical Exam   General:   Sitting on the side of the bed. No resp difficulty Neck: to jaw  Cor: Tachy Regular rate & rhythm.  Lungs: clear Abdomen: soft, nontender, distended.  Extremities: R and LLE 3+ no  edema Neuro: alert & oriented x3    Telemetry   A flutter RVR   EKG      Labs    CBC Recent Labs    05/03/24 1104 05/04/24 0256  WBC 12.2* 12.4*  NEUTROABS 8.3*  --   HGB 9.8* 10.2*  HCT 31.8* 33.8*  MCV 80.3 81.4  PLT 331 350   Basic Metabolic Panel Recent Labs    88/96/74 1104 05/04/24 0256  NA 132* 135  K 3.1* 3.5  CL 96* 96*  CO2 25 25  GLUCOSE 111* 124*  BUN 24* 27*  CREATININE 1.51* 1.59*  CALCIUM  8.3* 8.6*   Liver Function Tests Recent Labs    05/03/24 1104  AST 28  ALT 20  ALKPHOS 63  BILITOT 1.7*  PROT 7.4  ALBUMIN 3.2*   No results for input(s): LIPASE, AMYLASE in the last 72 hours. Cardiac Enzymes No results for input(s): CKTOTAL, CKMB, CKMBINDEX, TROPONINI in the last 72 hours.  BNP: BNP (last 3 results) Recent Labs    10/08/23 1006 03/08/24 1749 05/03/24 1104  BNP 181.5* 137.8* 836.5*    ProBNP (last 3 results) No results for input(s): PROBNP in the last 8760 hours.   D-Dimer No  results for input(s): DDIMER in the last 72 hours. Hemoglobin A1C No results for input(s): HGBA1C in the last 72 hours. Fasting Lipid Panel No results for input(s): CHOL, HDL, LDLCALC, TRIG, CHOLHDL, LDLDIRECT in the last 72 hours. Thyroid  Function Tests No results for input(s): TSH, T4TOTAL, T3FREE, THYROIDAB in the last 72 hours.  Invalid input(s): FREET3  Other results:   Imaging    US  ASCITES (ABDOMEN LIMITED) Result Date: 05/03/2024 CLINICAL DATA:  [id number] Ascites [id number] EXAM: LIMITED ABDOMEN ULTRASOUND FOR ASCITES TECHNIQUE: Limited ultrasound survey for ascites was performed in all four abdominal quadrants. COMPARISON:  None Available. FINDINGS: No ascites. IMPRESSION: No ascites. Electronically Signed   By: Rogelia Myers M.D.   On: 05/03/2024 17:36   DG Chest 2 View Result Date: 05/03/2024 EXAM: 2 VIEW(S) XRAY OF THE CHEST 05/03/2024 11:34:00 AM COMPARISON: 04/28/2024 CLINICAL HISTORY: sob FINDINGS: LINES, TUBES AND DEVICES: Left chest cardiac defibrillator noted. LUNGS AND PLEURA: No focal pulmonary opacity. No pulmonary edema. No pleural effusion. No pneumothorax. HEART AND MEDIASTINUM: Cardiomegaly. Lead from the left chest cardiac defibrillator terminates over the mediastinum. BONES AND SOFT TISSUES: No acute osseous abnormality. IMPRESSION: 1. No acute cardiopulmonary findings. 2.  Stable enlargement of the cardiac silhouette. Electronically signed by: Taylor Stroud MD 05/03/2024 01:13 PM EST RP Workstation: [id number]     Medications:     Scheduled Medications:  dapagliflozin  propanediol  10 mg Oral Daily   digoxin   125 mcg Oral Daily   insulin aspart  0-15 Units Subcutaneous TID WC   insulin aspart  0-5 Units Subcutaneous QHS   metoprolol  succinate  50 mg Oral Daily   polyethylene glycol  17 g Oral BID   senna-docusate  1 tablet Oral Daily   sodium chloride  flush  3 mL Intravenous Q12H   spironolactone   25 mg Oral Daily   thiamine   100 mg  Oral Daily    Infusions:  amiodarone 30 mg/hr (05/04/24 0011)   furosemide  (LASIX ) 200 mg in dextrose  5 % 100 mL (2 mg/mL) infusion 15 mg/hr (05/04/24 0903)    PRN Medications: acetaminophen  **OR** acetaminophen , bisacodyl , ondansetron  **OR** ondansetron  (ZOFRAN ) IV, oxyCODONE , perflutren  lipid microspheres (DEFINITY ) IV suspension    Patient Profile     Assessment/Plan    Acute on chronic systolic CHF: Nonischemic cardiomyopathy. [employer] ICD. Symptomatic since 9/21 (after episode of peri-tonsillar abscess).  He drinks ETOH but does not seem to describe heavy enough ETOH to cause profound cardiomyopathy (though he did have mild cirrhosis on abdominal US  at [facility]). No FH of cardiomyopathy. Echo 11/21 with EF < 20% with severe LV dilation, moderate RVE with severely decreased systolic function, severe biatrial enlargement, mild MR. [facility] 12/21 showed no coronary disease, markedly elevated filling pressures, and low cardiac output. Cardiac MRI in 11/21 showed severely dilated LV with diffuse hypokinesis, EF 17%, mod dilated RV with EF 20%, diffuse mid-wall LGE in septum, inferior wall & inferolateral wall.  LGE pattern suggests possible prior myocarditis. Echo in 3/22 showed that EF remains < 20% with mild RV dysfunction. CPX in 4/22 showed only a mild HF limitation.  Trypanosoma cruzi antibodies were negative (had h/o neurocysticercosis), probably not cardiomyopathy related to trypanosomiasis. Echo 1/25 showed EF 20-25%, G3DD, RV mildly reduced.  - Admitted with NYHA IV symptoms. Compliance appears to be an issue. Had been off some medications. Suspect heart failure worsened in the setting of AFL. Echo repeated.  - Marked volume overload. Continue lasix  drip . -Continue farxiga  10 mg daily -Continue  spiro 25 mg daily - continue digoxin  0.125 daily.  - decrease toprol  to 50 mg daily with CHF exacerbation - needs eventual TEE/DCCV once euvolemic -Place PICC. 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 Restart heparin  drip.  - send iron studies   Constipation - suspect related to increased intra-abdominal pressure - check abd US  - start sennakot and PRN dulcolax/myralax   ETOH:   - He did have early cirrhosis on abdominal US  at [facility].  However, with obesity could be MASH. He is cutting back his beer intake - Discussed cessation.   OSA: Moderate by sleep study 08/2020. AHI 31.1/hr - Needs to wear CPAP   Obesity:  -  on mounjaro in OP   SDOH:  - He is insured and has transportation. - Farxiga  $45/90  day, Entresto  $45/90 (can use co-pay cards for each)    Place PICC .  Check CVP and CO-)X  Length of Stay: 1  Amy Clegg, NP  05/04/2024, 11:07 AM  Advanced Heart Failure Team Pager [phone] (M-F; 7a - 5p)  Please contact [facility] for night-coverage after hours (5p -7a ) and weekends on [email or web address]  Patient seen and examined with the above-signed Advanced Practice Provider and/or Housestaff. I personally reviewed laboratory data, imaging studies and relevant notes. I independently examined the patient and formulated the important aspects of the plan. I have edited the note to reflect any of my changes or salient points. I have personally discussed the plan with the patient and/or family.  He remains in AF. He is massively volume overloaded and not responding well to IV lasix .   General:  Sitting up on side of bed. No resp difficulty HEENT: normal Neck: supple. JVP to ear Cor: Irreg tachy  Lungs: clear Abdomen: soft, nontender, ++ distended.Good bowel sounds. Extremities: no cyanosis, clubbing, rash, 3+  edema Neuro: alert & orientedx3, cranial nerves grossly intact. moves all 4 extremities w/o difficulty. Affect pleasant  He has mssive volume overload with poor response to IV lasix . Concern for possible low output.   Place PICC to check co-ox CVP. Increase diuretics. Continue amio/heparin . Low threshold for inotrope suppport.   Will need TEE/DC-CV when volume status improved.   Toribio Fuel, MD  10:31 PM

## 2024-05-04 NOTE — Progress Notes (Signed)
 Heart Failure Navigator Progress Note  Assessed for Heart & Vascular TOC clinic readiness.  Patient does not meet criteria due to Advanced Heart Failure Team is consulted with Dr. Rolan. .   Navigator will sign off at this time.   Stephane Haddock, BSN, Scientist, Clinical (histocompatibility And Immunogenetics) Only

## 2024-05-04 NOTE — Progress Notes (Incomplete)
 PHARMACY - ANTICOAGULATION CONSULT NOTE  Pharmacy Consult for Heparin  Indication: atrial fibrillation  Allergies  Allergen Reactions   Raspberry Anaphylaxis   Penicillins Rash    Reaction: Childhood    Patient Measurements: Height: 5' 6 (167.6 cm) Weight: (!) 156 kg (344 lb) IBW/kg (Calculated) : 63.8 HEPARIN  DW (KG): 102.6  Vital Signs: Temp: 97.9 F (36.6 C) (11/04 2026) Temp Source: Oral (11/04 2026) BP: 99/60 (11/04 2026) Pulse Rate: 96 (11/04 2026)  Labs: Recent Labs    05/03/24 1104 05/03/24 1304 05/04/24 0256 05/04/24 1059 05/04/24 2000  HGB 9.8*  --  10.2*  --   --   HCT 31.8*  --  33.8*  --   --   PLT 331  --  350  --   --   HEPARINUNFRC  --   --  <0.10* <0.10* <0.10*  CREATININE 1.51*  --  1.59*  --   --   TROPONINIHS 91* 92*  --   --   --     Estimated Creatinine Clearance: 81.8 mL/min (A) (by C-G formula based on SCr of 1.59 mg/dL (H)).  Assessment: Patient is a 48 year old male admitted for CHF exacerbation and Afib. Per notes and patient, he was found to be in AF last month in clinic, but [facility] was held due to concern for GI bleed with anemia. Pharmacy is consulted for heparin .   Heparin  was never started in the ED due to concern of GIB. After discussion with MDs to start but it was never started.   PM update: Heparin  level is undetectable.    Goal of Therapy:  Heparin  level 0.3-0.7 units/ml Monitor platelets by anticoagulation protocol: Yes   Plan:  Heparin  1600 units/hr Check 6 hr heparin  level  Daily HL and CBC.

## 2024-05-05 ENCOUNTER — Telehealth: Payer: Self-pay | Admitting: Gastroenterology

## 2024-05-05 ENCOUNTER — Ambulatory Visit: Attending: Cardiology | Admitting: Cardiology

## 2024-05-05 DIAGNOSIS — R570 Cardiogenic shock: Secondary | ICD-10-CM

## 2024-05-05 DIAGNOSIS — I5023 Acute on chronic systolic (congestive) heart failure: Secondary | ICD-10-CM | POA: Diagnosis not present

## 2024-05-05 LAB — BASIC METABOLIC PANEL WITH GFR
Anion gap: 18 — ABNORMAL HIGH (ref 5–15)
Anion gap: 11 (ref 5–15)
BUN: 34 mg/dL — ABNORMAL HIGH (ref 6–20)
BUN: 34 mg/dL — ABNORMAL HIGH (ref 6–20)
CO2: 25 mmol/L (ref 22–32)
CO2: 29 mmol/L (ref 22–32)
Calcium: 8.3 mg/dL — ABNORMAL LOW (ref 8.9–10.3)
Calcium: 8.4 mg/dL — ABNORMAL LOW (ref 8.9–10.3)
Chloride: 85 mmol/L — ABNORMAL LOW (ref 98–111)
Chloride: 91 mmol/L — ABNORMAL LOW (ref 98–111)
Creatinine, Ser: 2.28 mg/dL — ABNORMAL HIGH (ref 0.61–1.24)
Creatinine, Ser: 2.02 mg/dL — ABNORMAL HIGH (ref 0.61–1.24)
GFR, Estimated: 35 mL/min — ABNORMAL LOW (ref >60)
GFR, Estimated: 40 mL/min — ABNORMAL LOW (ref >60)
Glucose, Bld: 191 mg/dL — ABNORMAL HIGH (ref 70–99)
Glucose, Bld: 126 mg/dL — ABNORMAL HIGH (ref 70–99)
Potassium: 3.4 mmol/L — ABNORMAL LOW (ref 3.5–5.1)
Potassium: 2.9 mmol/L — ABNORMAL LOW (ref 3.5–5.1)
Sodium: 128 mmol/L — ABNORMAL LOW (ref 135–145)
Sodium: 131 mmol/L — ABNORMAL LOW (ref 135–145)

## 2024-05-05 LAB — COOXEMETRY PANEL
Carboxyhemoglobin: <0.3 % — ABNORMAL LOW (ref 0.5–1.5)
Carboxyhemoglobin: 1 % (ref 0.5–1.5)
Carboxyhemoglobin: 2.9 % — ABNORMAL HIGH (ref 0.5–1.5)
Methemoglobin: <0.7 % (ref 0.0–1.5)
Methemoglobin: <0.7 % (ref 0.0–1.5)
Methemoglobin: <0.7 % (ref 0.0–1.5)
O2 Saturation: 31.7 %
O2 Saturation: 41.2 %
O2 Saturation: 76.7 %
Total hemoglobin: 10.5 g/dL — ABNORMAL LOW (ref 12.0–16.0)
Total hemoglobin: 10 g/dL — ABNORMAL LOW (ref 12.0–16.0)
Total hemoglobin: 10 g/dL — ABNORMAL LOW (ref 12.0–16.0)

## 2024-05-05 LAB — GLUCOSE, CAPILLARY
Glucose-Capillary: 118 mg/dL — ABNORMAL HIGH (ref 70–99)
Glucose-Capillary: 117 mg/dL — ABNORMAL HIGH (ref 70–99)
Glucose-Capillary: 121 mg/dL — ABNORMAL HIGH (ref 70–99)
Glucose-Capillary: 90 mg/dL (ref 70–99)

## 2024-05-05 LAB — HEPARIN LEVEL (UNFRACTIONATED)
Heparin Unfractionated: <0.1 [IU]/mL — ABNORMAL LOW (ref 0.30–0.70)
Heparin Unfractionated: 0.25 [IU]/mL — ABNORMAL LOW (ref 0.30–0.70)

## 2024-05-05 LAB — LACTIC ACID, PLASMA: Lactic Acid, Venous: 1.1 mmol/L (ref 0.5–1.9)

## 2024-05-05 MED ORDER — SODIUM CHLORIDE 0.9 % IV SOLN: 12.5000 mg | Freq: Four times a day (QID) | INTRAVENOUS | Status: DC | PRN

## 2024-05-05 MED ORDER — POTASSIUM CHLORIDE 10 MEQ/100ML IV SOLN
10.0000 meq | INTRAVENOUS | Status: AC
  Administered 2024-05-05: 10 meq via INTRAVENOUS
  Filled 2024-05-05 (×2): qty 100

## 2024-05-05 MED ORDER — POTASSIUM CHLORIDE CRYS ER 20 MEQ PO TBCR
40.0000 meq | EXTENDED_RELEASE_TABLET | ORAL | Status: AC
  Administered 2024-05-05 (×2): 40 meq via ORAL
  Filled 2024-05-05 (×3): qty 2

## 2024-05-05 MED ORDER — POTASSIUM CHLORIDE CRYS ER 20 MEQ PO TBCR
40.0000 meq | EXTENDED_RELEASE_TABLET | Freq: Once | ORAL | Status: AC
  Administered 2024-05-05: 40 meq via ORAL
  Filled 2024-05-05: qty 2

## 2024-05-05 MED ORDER — ADULT MULTIVITAMIN W/MINERALS CH
1.0000 | ORAL_TABLET | Freq: Every day | ORAL | Status: DC
  Administered 2024-05-05 – 2024-05-12 (×7): 1 via ORAL
  Filled 2024-05-05 (×9): qty 1

## 2024-05-05 MED ORDER — POTASSIUM CHLORIDE CRYS ER 20 MEQ PO TBCR
40.0000 meq | EXTENDED_RELEASE_TABLET | Freq: Once | ORAL | Status: AC
  Administered 2024-05-05: 40 meq via ORAL

## 2024-05-05 MED ORDER — DOBUTAMINE-DEXTROSE 4-5 MG/ML-% IV SOLN
5.0000 ug/kg/min | INTRAVENOUS | Status: DC
  Administered 2024-05-05 – 2024-05-10 (×6): 5 ug/kg/min via INTRAVENOUS
  Filled 2024-05-05 (×6): qty 250

## 2024-05-05 MED ORDER — FUROSEMIDE 10 MG/ML IJ SOLN
80.0000 mg | Freq: Once | INTRAMUSCULAR | Status: AC
  Administered 2024-05-05: 80 mg via INTRAVENOUS
  Filled 2024-05-05: qty 8

## 2024-05-05 MED ORDER — POTASSIUM CHLORIDE CRYS ER 20 MEQ PO TBCR
40.0000 meq | EXTENDED_RELEASE_TABLET | ORAL | Status: AC
  Administered 2024-05-05 (×2): 40 meq via ORAL
  Filled 2024-05-05 (×2): qty 2

## 2024-05-05 NOTE — Telephone Encounter (Signed)
 Patient currently hospitalized for acute CHF exacerbation.  Had been seen in office to discuss endoscopic evaluation of iron deficiency anemia, but was sent to the ER via EMS due to acute shortness of breath, fluid overload, and tachycardia.  He will need a follow-up in the office in about 6 weeks to revisit endoscopic evaluation.  Ideally would set this up with Dr. Wilhelmenia if he has any availability, otherwise can schedule with Pod C APP.

## 2024-05-05 NOTE — TOC Initial Note (Signed)
 Transition of Care [facility]) - Initial/Assessment Note    Patient Details  Name: Kyle Pearson MRN: [id number] Date of Birth: Nov 07, 1975  [facility] CM/SW Contact:    Kyle Pearson Phone Number: [phone] 05/05/2024, 11:58 AM  Clinical Narrative:  11:24 AM- HF CSW attempted to meet with patient at bedside. Patient was sleeping and requested for CSW to come back at a later time.   HF CSW/CM will continue to follow and monitor for dc readiness.                        Patient Goals and CMS Choice            Expected Discharge Plan and Services                                              Prior Living Arrangements/Services                       Activities of Daily Living      Permission Sought/Granted                  Emotional Assessment              Admission diagnosis:  Orthopnea [R06.01] SOB (shortness of breath) [R06.02] Dyspnea on exertion [R06.09] Acute on chronic systolic (congestive) heart failure (HCC) [I50.23] Acute on chronic congestive heart failure, unspecified heart failure type (HCC) [I50.9] Patient Active Problem List   Diagnosis Date Noted   Acute on chronic systolic (congestive) heart failure (HCC) 05/03/2024   Hematochezia 04/27/2024   Shortness of breath 04/27/2024   Increase in serum creatinine from prior measurement 04/27/2024   Low vitamin D level 04/27/2024   Type 2 diabetes mellitus with hyperglycemia, without long-term current use of insulin (HCC) 04/27/2024   Atrial flutter (HCC) 04/27/2024   Pulmonary hypertensive venous disease (HCC) 04/27/2024   Physical exam, annual 04/21/2024   External hemorrhoid 04/21/2024   Anemia 04/21/2024   Morbid obesity (HCC) 04/21/2024   CHF (congestive heart failure) (HCC) 12/08/2020   OSA (obstructive sleep apnea) 11/14/2020   Snoring 08/19/2020   Acute exacerbation of CHF (congestive heart failure) (HCC) 05/29/2020   Obesity, Class III,  BMI 40-49.9 (morbid obesity) (HCC) 05/29/2020   Other cirrhosis of liver (HCC) 05/29/2020   Leukocytosis 03/17/2020   PCP:  Kyle Jeoffrey RAMAN, FNP Pharmacy:   [facility] [phone] - [place], [place] - [address] AT [facility] OF [address] [place] [address] Phone: [phone] Fax: [phone]     Social Drivers of Health (SDOH) Social History: SDOH Screenings   Food Insecurity: No Food Insecurity (05/04/2024)  Housing: Low Risk  (05/04/2024)  Transportation Needs: No Transportation Needs (05/04/2024)  Utilities: Not At Risk (05/04/2024)  Tobacco Use: Medium Risk (05/03/2024)   SDOH Interventions:     Readmission Risk Interventions     No data to display

## 2024-05-05 NOTE — Progress Notes (Signed)
 PROGRESS NOTE    Kyle Pearson  FMW:[id number] DOB: 1975-10-10 DOA: 05/03/2024 PCP: Kayla Jeoffrey RAMAN, FNP  48/M, morbidly obese with chronic systolic CHF/NICM, paroxysmal A-fib, EtOH abuse, liver cirrhosis, OSA, presented to GI office 11/3 to discuss colonoscopy, he was noted to be significantly volume overloaded and  in atrial flutter with RVR, sent to the ED, questionable compliance, and still does not have his CPAP. - In the ED A-fib RVR, blood pressure in the 110s, BNP 836, troponin 91, creatinine 1.5, sodium 132, WBC 12.2 - 11/4 heart failure team consulting, started on Lasix  gtt. - 11/5, concern for cardiogenic shock, low output, starting dobutamine   Subjective: - Remains uncomfortable, poor response to diuretics yesterday nausea, orthopnea, dyspnea  Assessment and Plan:  Acute on chronic systolic CHF, NICM Cardiogenic shock, low output - Last echo 1/25 noted EF 20-25, grade 3 DD, mildly reduced RV, LHC 12/21 noted no CAD - Significantly volume overloaded on admission this time, concern for compliance with medications - Heart failure team following, Co. ox down to 31%, CVP 30, poor urine output, worsening kidney function -Concern for cardiogenic shock, starting dobutamine today -Toprol  discontinued -Remains on Aldactone  Farxiga  and digoxin  - May need transfer to ICU if he deteriorates further  Atrial flutter with RVR - Recently anticoagulation held due to concern for GI bleed - Currently on heparin  gtt. and Amio gtt. - Plan for cardioversion when volume status is improved  Severe iron deficiency anemia - Given, monitor for GI bleed on anticoagulation-on IV heparin  - Follow-up CBC  Liver cirrhosis EtOH abuse - Ultrasound now with no ascites - Diuretics as noted above, EtOH cessation counseled - Thiamine , multivitamin, monitor for withdrawal  Moderate OSA - Reports that his CPAP is yet to be delivered - Order CPAP nightly, TOC consult for home CPAP  Morbid  obesity, BMI is 55.5 - On Mounjaro now  Constipation Now on laxatives, monitor   DVT prophylaxis: hep gtt Code Status: Full Family Communication: Disposition Plan:      Objective: Vitals:   05/05/24 0500 05/05/24 0725 05/05/24 0734 05/05/24 0839  BP: 94/79  (!) 152/121 98/70  Pulse: 100  61 95  Resp: 20  18   Temp: 97.8 F (36.6 C)  97.9 F (36.6 C)   TempSrc: Oral Oral Oral   SpO2: 96%  96%   Weight: (!) 156.9 kg     Height: 5' 6 (1.676 m)       Intake/Output Summary (Last 24 hours) at 05/05/2024 1017 Last data filed at 05/05/2024 9286 Gross per 24 hour  Intake 1531.15 ml  Output 750 ml  Net 781.15 ml   Filed Weights   05/03/24 1057 05/05/24 0500  Weight: (!) 156 kg (!) 156.9 kg    Examination:  General exam: AAO x 3, morbidly obese, uncomfortable appearing Respiratory system: Decreased at the bases Cardiovascular system: S1 & S2 heard, irregular Abd: Massively distended, nontender, bowel sounds present Central nervous system: Alert and oriented. No focal neurological deficits. Extremities: 2+ edema Skin: No rashes Psychiatry:  Mood & affect appropriate.     Data Reviewed:   CBC: Recent Labs  Lab 05/03/24 1104 05/04/24 0256  WBC 12.2* 12.4*  NEUTROABS 8.3*  --   HGB 9.8* 10.2*  HCT 31.8* 33.8*  MCV 80.3 81.4  PLT 331 350   Basic Metabolic Panel: Recent Labs  Lab 05/03/24 1104 05/04/24 0256 05/05/24 0650  NA 132* 135 128*  K 3.1* 3.5 3.4*  CL 96* 96* 85*  CO2  25 25 25   GLUCOSE 111* 124* 191*  BUN 24* 27* 34*  CREATININE 1.51* 1.59* 2.28*  CALCIUM  8.3* 8.6* 8.3*   GFR: Estimated Creatinine Clearance: 57.2 mL/min (A) (by C-G formula based on SCr of 2.28 mg/dL (H)). Liver Function Tests: Recent Labs  Lab 05/03/24 1104  AST 28  ALT 20  ALKPHOS 63  BILITOT 1.7*  PROT 7.4  ALBUMIN 3.2*   No results for input(s): LIPASE, AMYLASE in the last 168 hours. No results for input(s): AMMONIA in the last 168 hours. Coagulation  Profile: No results for input(s): INR, PROTIME in the last 168 hours. Cardiac Enzymes: No results for input(s): CKTOTAL, CKMB, CKMBINDEX, TROPONINI in the last 168 hours. BNP (last 3 results) No results for input(s): PROBNP in the last 8760 hours. HbA1C: No results for input(s): HGBA1C in the last 72 hours. CBG: Recent Labs  Lab 05/04/24 0253 05/04/24 0938 05/04/24 1622 05/04/24 2128 05/05/24 0631  GLUCAP 135* 173* 152* 132* 118*   Lipid Profile: No results for input(s): CHOL, HDL, LDLCALC, TRIG, CHOLHDL, LDLDIRECT in the last 72 hours. Thyroid  Function Tests: No results for input(s): TSH, T4TOTAL, FREET4, T3FREE, THYROIDAB in the last 72 hours. Anemia Panel: Recent Labs    05/04/24 0256  FERRITIN 15*  TIBC 476*  IRON 30*   Urine analysis:    Component Value Date/Time   COLORURINE YELLOW 04/27/2024 1215   APPEARANCEUR CLEAR 04/27/2024 1215   LABSPEC 1.015 04/27/2024 1215   PHURINE 6.5 04/27/2024 1215   GLUCOSEU 3+ (A) 04/27/2024 1215   HGBUR TRACE (A) 04/27/2024 1215   BILIRUBINUR NEGATIVE 03/13/2007 1222   KETONESUR NEGATIVE 04/27/2024 1215   PROTEINUR 3+ (A) 04/27/2024 1215   UROBILINOGEN 0.2 03/13/2007 1222   NITRITE NEGATIVE 04/27/2024 1215   LEUKOCYTESUR NEGATIVE 04/27/2024 1215   Sepsis Labs: @LABRCNTIP (procalcitonin:4,lacticidven:4)  )No results found for this or any previous visit (from the past 240 hours).   Radiology Studies: DG CHEST PORT 1 VIEW Result Date: 05/04/2024 EXAM: 1 VIEW(S) XRAY OF THE CHEST 05/04/2024 06:11:00 PM COMPARISON: 05/03/2024 CLINICAL HISTORY: Status post PICC central line placement. FINDINGS: LINES, TUBES AND DEVICES: Subcutaneous defibrillator wire projects over the left chest, unchanged. Right PICC line tip in the SVC. LUNGS AND PLEURA: No focal pulmonary opacity. No pulmonary edema. No pleural effusion. No pneumothorax. HEART AND MEDIASTINUM: Cardiomegaly. BONES AND SOFT TISSUES: No acute  osseous abnormality. IMPRESSION: 1. Right PICC line tip in the SVC. 2. Cardiomegaly. Electronically signed by: Franky Crease MD 05/04/2024 06:19 PM EST RP Workstation: [id number]   ECHOCARDIOGRAM COMPLETE Result Date: 05/04/2024    ECHOCARDIOGRAM REPORT   Patient Name:   Kyle Pearson Date of Exam: 05/04/2024 Medical Rec #:  [id number]           Height:       66.0 in Accession #:    [id number]          Weight:       344.0 lb Date of Birth:  [birth year]          BSA:          2.518 m Patient Age:    47 years            BP:           120/81 mmHg Patient Gender: M                   HR:           100 bpm. Exam Location:  Inpatient  Procedure: 2D Echo and Intracardiac Opacification Agent (Both Spectral and Color            Flow Doppler were utilized during procedure). Indications:    CHF  History:        Patient has prior history of Echocardiogram examinations. CHF;                 Arrythmias:Atrial Fibrillation.  Sonographer:    Norleen Amour Referring Phys: [phone] EKTA V PATEL IMPRESSIONS  1. Left ventricular ejection fraction, by estimation, is <20%. The left ventricle has severely decreased function. The left ventricle demonstrates global hypokinesis. The left ventricular internal cavity size was severely dilated. Left ventricular diastolic parameters are indeterminate.  2. Right ventricular systolic function is mildly reduced. The right ventricular size is severely enlarged. There is moderately elevated pulmonary artery systolic pressure.  3. Left atrial size was moderately dilated.  4. Right atrial size was severely dilated.  5. The mitral valve is normal in structure. No evidence of mitral valve regurgitation.  6. The aortic valve is normal in structure. There is mild calcification of the aortic valve. Aortic valve regurgitation is not visualized. No aortic stenosis is present. Comparison(s): LVEF is worse from prior. FINDINGS  Left Ventricle: Left ventricular ejection fraction, by estimation, is <20%. The  left ventricle has severely decreased function. The left ventricle demonstrates global hypokinesis. The left ventricular internal cavity size was severely dilated. There is no left ventricular hypertrophy. Left ventricular diastolic parameters are indeterminate. Right Ventricle: The right ventricular size is severely enlarged. No increase in right ventricular wall thickness. Right ventricular systolic function is mildly reduced. There is moderately elevated pulmonary artery systolic pressure. The tricuspid regurgitant velocity is 3.23 m/s, and with an assumed right atrial pressure of 8 mmHg, the estimated right ventricular systolic pressure is 49.7 mmHg. Left Atrium: Left atrial size was moderately dilated. Right Atrium: Right atrial size was severely dilated. Pericardium: There is no evidence of pericardial effusion. Mitral Valve: The mitral valve is normal in structure. No evidence of mitral valve regurgitation. Tricuspid Valve: The tricuspid valve is normal in structure. Tricuspid valve regurgitation is mild . No evidence of tricuspid stenosis. Aortic Valve: The aortic valve is normal in structure. There is mild calcification of the aortic valve. Aortic valve regurgitation is not visualized. No aortic stenosis is present. Pulmonic Valve: The pulmonic valve was normal in structure. Pulmonic valve regurgitation is not visualized. No evidence of pulmonic stenosis. Aorta: The aortic root and ascending aorta are structurally normal, with no evidence of dilitation. IAS/Shunts: No atrial level shunt detected by color flow Doppler.  LEFT VENTRICLE PLAX 2D LVIDd:         7.80 cm      Diastology LVIDs:         7.50 cm      LV e' medial:    5.59 cm/s LV PW:         0.80 cm      LV E/e' medial:  19.0 LV IVS:        0.80 cm      LV e' lateral:   11.76 cm/s LVOT diam:     2.10 cm      LV E/e' lateral: 9.0 LV SV:         27 LV SV Index:   11 LVOT Area:     3.46 cm  LV Volumes (MOD) LV vol d, MOD A2C: 288.0 ml LV vol d, MOD  A4C: 338.0 ml LV vol s,  MOD A2C: 261.0 ml LV vol s, MOD A4C: 245.0 ml LV SV MOD A2C:     27.0 ml LV SV MOD A4C:     338.0 ml LV SV MOD BP:      60.9 ml RIGHT VENTRICLE RV Basal diam:  5.70 cm    PULMONARY VEINS RV S prime:     8.70 cm/s  Diastolic Velocity: 43.00 cm/s TAPSE (M-mode): 1.6 cm     S/D Velocity:       0.60                            Systolic Velocity:  24.30 cm/s LEFT ATRIUM              Index        RIGHT ATRIUM           Index LA diam:        4.90 cm  1.95 cm/m   RA Area:     33.80 cm LA Vol (A2C):   107.0 ml 42.49 ml/m  RA Volume:   126.00 ml 50.04 ml/m LA Vol (A4C):   105.0 ml 41.70 ml/m LA Biplane Vol: 106.0 ml 42.09 ml/m  AORTIC VALVE             PULMONIC VALVE LVOT Vmax:   58.37 cm/s  PV Vmax:       0.74 m/s LVOT Vmean:  40.067 cm/s PV Peak grad:  2.2 mmHg LVOT VTI:    0.079 m  AORTA Ao Root diam: 2.90 cm Ao Asc diam:  2.90 cm MITRAL VALVE                TRICUSPID VALVE MV Area (PHT): 1.43 cm     TR Peak grad:   41.7 mmHg MV Decel Time: 531 msec     TR Vmax:        323.00 cm/s MV E velocity: 106.12 cm/s                             SHUNTS                             Systemic VTI:  0.08 m                             Systemic Diam: 2.10 cm Stanly Leavens MD Electronically signed by Stanly Leavens MD Signature Date/Time: 05/04/2024/1:16:09 PM    Final    US  EKG SITE RITE Result Date: 05/04/2024 If Site Rite image not attached, placement could not be confirmed due to current cardiac rhythm.  US  ASCITES (ABDOMEN LIMITED) Result Date: 05/03/2024 CLINICAL DATA:  [id number] Ascites [id number] EXAM: LIMITED ABDOMEN ULTRASOUND FOR ASCITES TECHNIQUE: Limited ultrasound survey for ascites was performed in all four abdominal quadrants. COMPARISON:  None Available. FINDINGS: No ascites. IMPRESSION: No ascites. Electronically Signed   By: Rogelia Myers M.D.   On: 05/03/2024 17:36   DG Chest 2 View Result Date: 05/03/2024 EXAM: 2 VIEW(S) XRAY OF THE CHEST 05/03/2024 11:34:00 AM COMPARISON:  04/28/2024 CLINICAL HISTORY: sob FINDINGS: LINES, TUBES AND DEVICES: Left chest cardiac defibrillator noted. LUNGS AND PLEURA: No focal pulmonary opacity. No pulmonary edema. No pleural effusion. No pneumothorax. HEART AND MEDIASTINUM: Cardiomegaly. Lead from the left chest cardiac defibrillator terminates over the mediastinum. BONES AND SOFT TISSUES:  No acute osseous abnormality. IMPRESSION: 1. No acute cardiopulmonary findings. 2. Stable enlargement of the cardiac silhouette. Electronically signed by: Waddell Calk MD 05/03/2024 01:13 PM EST RP Workstation: [id number]     Scheduled Meds:  Chlorhexidine  Gluconate Cloth  6 each Topical Daily   dapagliflozin  propanediol  10 mg Oral Daily   digoxin   125 mcg Oral Daily   insulin aspart  0-15 Units Subcutaneous TID WC   insulin aspart  0-5 Units Subcutaneous QHS   polyethylene glycol  17 g Oral BID   potassium chloride   40 mEq Oral Q4H   senna-docusate  1 tablet Oral Daily   sodium chloride  flush  10-40 mL Intracatheter Q12H   sodium chloride  flush  3 mL Intravenous Q12H   spironolactone   25 mg Oral Daily   thiamine   100 mg Oral Daily   Continuous Infusions:  amiodarone 30 mg/hr (05/05/24 0616)   DOBUTamine 5 mcg/kg/min (05/05/24 0928)   furosemide  (LASIX ) 200 mg in dextrose  5 % 100 mL (2 mg/mL) infusion 15 mg/hr (05/05/24 0616)   heparin  1,900 Units/hr (05/05/24 0737)   promethazine (PHENERGAN) injection (IM or IVPB)       LOS: 2 days    Time spent:    Sigurd Pac, MD [facility]   05/05/2024, 10:17 AM

## 2024-05-05 NOTE — Progress Notes (Signed)
 PHARMACY - ANTICOAGULATION CONSULT NOTE  Pharmacy Consult for Heparin  Indication: atrial fibrillation  Allergies  Allergen Reactions   Raspberry Anaphylaxis   Penicillins Rash    Reaction: Childhood    Patient Measurements: Height: 5' 6 (167.6 cm) Weight: (!) 156.9 kg (345 lb 14.4 oz) IBW/kg (Calculated) : 63.8 HEPARIN  DW (KG): 102.9  Vital Signs: Temp: 97.8 F (36.6 C) (11/05 1145) Temp Source: Oral (11/05 1145) BP: 109/74 (11/05 1145) Pulse Rate: 88 (11/05 1145)  Labs: Recent Labs    05/03/24 1104 05/03/24 1304 05/04/24 0256 05/04/24 1059 05/04/24 2000 05/05/24 0650 05/05/24 1700  HGB 9.8*  --  10.2*  --   --   --   --   HCT 31.8*  --  33.8*  --   --   --   --   PLT 331  --  350  --   --   --   --   HEPARINUNFRC  --   --  <0.10*   < > <0.10* <0.10* 0.25*  CREATININE 1.51*  --  1.59*  --   --  2.28* 2.02*  TROPONINIHS 91* 92*  --   --   --   --   --    < > = values in this interval not displayed.    Estimated Creatinine Clearance: 64.6 mL/min (A) (by C-G formula based on SCr of 2.02 mg/dL (H)).   Assessment: Patient is a 48 year old male admitted for CHF exacerbation and Afib. Per notes and patient, he was found to be in AF last month in clinic, but [facility] was held due to concern for GI bleed with anemia. Pharmacy is consulted for heparin .   Heparin  level sub-therapeutic but trending up.  No issue with infusion nor bleeding per discussion with RN.  Goal of Therapy:  Heparin  level 0.3-0.7 units/ml Monitor platelets by anticoagulation protocol: Yes   Plan:  Increase heparin  2100 units/hr HL in 6 hours Daily HL and CBC  Monitor for s/sx of bleeding  Marlie Kuennen D. Lendell, PharmD, BCPS, BCCCP 05/05/2024, 6:05 PM

## 2024-05-05 NOTE — Progress Notes (Signed)
 Advanced Heart Failure Rounding Note  Cardiologist: None  Chief Complaint: Cardiogenic Shock Subjective:    No presenting with low output and cardiogenic shock.  Co-ox 31%, CVP 30. Poor UOP on Lasix  gtt. sCr jumped 1.44>1.59>2.28.   Feeling terrible, shortness of breath. Persistent nausea, did not sleep well overnight. +Orthopnea  Objective:    Weight Range: (!) 156.9 kg Body mass index is 55.83 kg/m.   Vital Signs:   Temp:  [97.5 F (36.4 C)-97.9 F (36.6 C)] 97.9 F (36.6 C) (11/05 0734) Pulse Rate:  [61-105] 61 (11/05 0734) Resp:  [18-20] 18 (11/05 0734) BP: (94-152)/(60-121) 152/121 (11/05 0734) SpO2:  [93 %-100 %] 96 % (11/05 0734) Weight:  [156.9 kg] 156.9 kg (11/05 0500) Last BM Date : 05/04/24  Weight change: Filed Weights   05/03/24 1057 05/05/24 0500  Weight: (!) 156 kg (!) 156.9 kg   Intake/Output:  Intake/Output Summary (Last 24 hours) at 05/05/2024 0833 Last data filed at 05/05/2024 0713 Gross per 24 hour  Intake 1531.15 ml  Output 750 ml  Net 781.15 ml    Physical Exam    General: Acutely-ill appearing. No distress on RA Cardiac: JVP to ear. S1 and S2 present. No murmurs  Abdomen: Firm, non-tender, distended.  Extremities: Cool and dry.  2+ BLE edema.  Neuro: Alert and oriented x3. Moves all extremities without difficulty.  Telemetry   AF 100-110s (personally reviewed)  Labs    CBC Recent Labs    05/03/24 1104 05/04/24 0256  WBC 12.2* 12.4*  NEUTROABS 8.3*  --   HGB 9.8* 10.2*  HCT 31.8* 33.8*  MCV 80.3 81.4  PLT 331 350   Basic Metabolic Panel Recent Labs    88/95/74 0256 05/05/24 0650  NA 135 128*  K 3.5 3.4*  CL 96* 85*  CO2 25 25  GLUCOSE 124* 191*  BUN 27* 34*  CREATININE 1.59* 2.28*  CALCIUM  8.6* 8.3*   Liver Function Tests Recent Labs    05/03/24 1104  AST 28  ALT 20  ALKPHOS 63  BILITOT 1.7*  PROT 7.4  ALBUMIN 3.2*   BNP (last 3 results) Recent Labs    10/08/23 1006 03/08/24 1749 05/03/24 1104   BNP 181.5* 137.8* 836.5*   Medications:     Scheduled Medications:  Chlorhexidine  Gluconate Cloth  6 each Topical Daily   dapagliflozin  propanediol  10 mg Oral Daily   digoxin   125 mcg Oral Daily   insulin aspart  0-15 Units Subcutaneous TID WC   insulin aspart  0-5 Units Subcutaneous QHS   polyethylene glycol  17 g Oral BID   potassium chloride   40 mEq Oral Q4H   senna-docusate  1 tablet Oral Daily   sodium chloride  flush  10-40 mL Intracatheter Q12H   sodium chloride  flush  3 mL Intravenous Q12H   spironolactone   25 mg Oral Daily   thiamine   100 mg Oral Daily    Infusions:  amiodarone 30 mg/hr (05/05/24 0616)   furosemide  (LASIX ) 200 mg in dextrose  5 % 100 mL (2 mg/mL) infusion 15 mg/hr (05/05/24 0616)   heparin  1,900 Units/hr (05/05/24 0737)    PRN Medications: acetaminophen  **OR** acetaminophen , bisacodyl , ondansetron  **OR** ondansetron  (ZOFRAN ) IV, oxyCODONE , sodium chloride  flush  Assessment/Plan   Cardiogenic Shock  Acute on chronic systolic CHF: Nonischemic cardiomyopathy. [employer] ICD. Symptomatic since 9/21 (after episode of peri-tonsillar abscess).  He drinks ETOH but does not seem to describe heavy enough ETOH to cause profound cardiomyopathy (though he did have mild  cirrhosis on abdominal US  at [facility]). No FH of cardiomyopathy. Echo 11/21 with EF < 20% with severe LV dilation, moderate RVE with severely decreased systolic function, severe biatrial enlargement, mild MR. [facility] 12/21 showed no coronary disease, markedly elevated filling pressures, and low cardiac output. Cardiac MRI in 11/21 showed severely dilated LV with diffuse hypokinesis, EF 17%, mod dilated RV with EF 20%, diffuse mid-wall LGE in septum, inferior wall & inferolateral wall.  LGE pattern suggests possible prior myocarditis. Echo in 3/22 showed that EF remains < 20% with mild RV dysfunction. CPX in 4/22 showed only a mild HF limitation.  Trypanosoma cruzi antibodies were negative (had h/o  neurocysticercosis), probably not cardiomyopathy related to trypanosomiasis. Echo 1/25 showed EF 20-25%, G3DD, RV mildly reduced. Repeat echo 11/4 with EF <20%, mildly reduced RV. - Admitted with NYHA IV symptoms. Compliance appears to be an issue. Had been off some medications. Suspect heart failure worsened in the setting of AFL.  - Significantly volume overloaded. - Marked volume overload. Continue lasix  drip . - Continue farxiga  10 mg daily - Continue  spiro 25 mg daily - continue digoxin  0.125 daily.  - decrease toprol  to 50 mg daily with CHF exacerbation - needs eventual TEE/DCCV once euvolemic -Place PICC. Check CVP and CO-OX    Paroxysmal AFL - new as of ICM remote monitoring 04/17/24; see on ECG by PCP -Restart  heparin  gtt with eventual conversion to  - Continue amio 30/hr (no bolus) for rate control - Continue lower dose of toprol  to 50 mg daily with severe hypervolemia - will need eventual TEE/DCCV once euvolemic   Anemia - PCP referred to GI due to concern for GIB - hgb stable today. Restart heparin  drip.  - send iron studies   Constipation - suspect related to increased intra-abdominal pressure - check abd US  - start sennakot and PRN dulcolax/myralax   ETOH:   - He did have early cirrhosis on abdominal US  at [facility].  However, with obesity could be MASH. He is cutting back his beer intake - Discussed cessation.   OSA: Moderate by sleep study 08/2020. AHI 31.1/hr - Needs to wear CPAP   Obesity:  -  on mounjaro in OP   SDOH:  - He is insured and has transportation. - Farxiga  $45/90 dayEntresto  $45/90 (can use co-pay cards for each)  Length of Stay: 2  Jordan Lee, NP  05/05/2024, 8:33 AM  Advanced Heart Failure Team Pager [phone] (M-F; 7a - 5p)  Please contact [facility] for night-coverage after hours (5p -7a ) and weekends on [email or web address]  Agree with above.  Progressively ill today. Co-ox 30-40s. Feels SOB. Weak orthopneic.   Remains in AF with RVR.  Not diuresing well. Scr up   General:  Sitting up on side of bed. No resp difficulty HEENT: normal Neck: supple. JVP to ear.  Cor: irregular tachy Lungs: clear Abdomen: soft, nontender, ++ distended.Good bowel sounds. Extremities: no cyanosis, clubbing, rash, 3+ edema Neuro: alert & orientedx3, cranial nerves grossly intact. moves all 4 extremities w/o difficulty. Affect pleasant  He is low output with cardiogenic shock. Will start dobutamine asap. Follw CVP and co-ox. Diurese aggressively. If not responding quickly to DBA will need to move to the ICU. Switch Eliquis to heparin  in case need for mechanical support. Continue IV amio.   CRITICAL CARE Performed by: Cherrie Sieving  Total critical care time: 50 minutes  Critical care time was exclusive of separately billable procedures and treating other patients.  Critical care was  necessary to treat or prevent imminent or life-threatening deterioration.  Critical care was time spent personally by me (independent of midlevel providers or residents) on the following activities: development of treatment plan with patient and/or surrogate as well as nursing, discussions with consultants, evaluation of patient's response to treatment, examination of patient, obtaining history from patient or surrogate, ordering and performing treatments and interventions, ordering and review of laboratory studies, ordering and review of radiographic studies, pulse oximetry and re-evaluation of patient's condition.  Toribio Fuel, MD  9:24 AM

## 2024-05-05 NOTE — Progress Notes (Signed)
 PHARMACY - ANTICOAGULATION CONSULT NOTE  Pharmacy Consult for Heparin  Indication: atrial fibrillation  Allergies  Allergen Reactions   Raspberry Anaphylaxis   Penicillins Rash    Reaction: Childhood    Patient Measurements: Height: 5' 6 (167.6 cm) Weight: (!) 156.9 kg (345 lb 14.4 oz) IBW/kg (Calculated) : 63.8 HEPARIN  DW (KG): 102.9  Vital Signs: Temp: 97.8 F (36.6 C) (11/05 0500) Temp Source: Oral (11/05 0500) BP: 94/79 (11/05 0500) Pulse Rate: 100 (11/05 0500)  Labs: Recent Labs    05/03/24 1104 05/03/24 1304 05/04/24 0256 05/04/24 0256 05/04/24 1059 05/04/24 2000 05/05/24 0650  HGB 9.8*  --  10.2*  --   --   --   --   HCT 31.8*  --  33.8*  --   --   --   --   PLT 331  --  350  --   --   --   --   HEPARINUNFRC  --   --  <0.10*   < > <0.10* <0.10* <0.10*  CREATININE 1.51*  --  1.59*  --   --   --   --   TROPONINIHS 91* 92*  --   --   --   --   --    < > = values in this interval not displayed.    Estimated Creatinine Clearance: 82 mL/min (A) (by C-G formula based on SCr of 1.59 mg/dL (H)).   Medical History: Past Medical History:  Diagnosis Date   Atrial flutter (HCC)    CHF (congestive heart failure) (HCC)    OSA (obstructive sleep apnea)    Pacemaker    Pulmonary hypertensive venous disease (HCC)    Type 2 diabetes mellitus with hyperglycemia, without long-term current use of insulin (HCC)     Medications:  Infusions:   amiodarone 30 mg/hr (05/05/24 0616)   furosemide  (LASIX ) 200 mg in dextrose  5 % 100 mL (2 mg/mL) infusion 15 mg/hr (05/05/24 0616)   heparin  1,600 Units/hr (05/05/24 9383)    Assessment: Patient is a 48 year old male admitted for CHF exacerbation and Afib. Per notes and patient, he was found to be in AF last month in clinic, but [facility] was held due to concern for GI bleed with anemia. Pharmacy is consulted for heparin .   Heparin  level came back subtherapeutic this AM. We will increase rate and check another level.   Goal of  Therapy:  Heparin  level 0.3-0.7 units/ml Monitor platelets by anticoagulation protocol: Yes   Plan:  Increase heparin  1900 units/hr HL in 6 hours Daily HL and CBC.   Sergio Batch, PharmD, BCIDP, AAHIVP, CPP Infectious Disease Pharmacist 05/05/2024 7:26 AM

## 2024-05-05 NOTE — Progress Notes (Signed)
 Orthopedic Tech Progress Note Patient Details:  Kyle Pearson January 25, 1976 [id number]  Ortho Devices Type of Ortho Device: Ace wrap, Unna boot Ortho Device/Splint Location: BLE Ortho Device/Splint Interventions: Application, Ordered   Post Interventions Patient Tolerated: Well Instructions Provided: Care of device  [name] Pac 05/05/2024, 4:56 PM

## 2024-05-05 NOTE — Progress Notes (Signed)
 Attending Physician's Attestation   I have reviewed the chart.   I agree with the Advanced Practitioner's note, impression, and recommendations with any updates as below. Agree with need for endoscopic evaluation at some point, but critical to have optimization in his respiratory and cardiac standing. Would set up a followup in clinic in 6-weeks to have further discussion and see where things stand. Would have to be a hospital-outpatient based procedure due to BMI.   Aloha Finner, MD [facility] Advanced Endoscopy Office # [phone]

## 2024-05-05 NOTE — Progress Notes (Signed)
Placed patient on CPAP for the night via auto-mode.  

## 2024-05-05 NOTE — Progress Notes (Signed)
 Initial Nutrition Assessment  DOCUMENTATION CODES:   Morbid obesity  INTERVENTION:  Add daily multivitamin PO. Continue thiamine  100 mg PO daily. Provided patient with diet education for carbohydrate control and sodium restriction. Continue heart-healthy diet.   NUTRITION DIAGNOSIS:    (Morbid obesity) related to  (excessive energy intake) as evidenced by  (BMI >55).   GOAL:   Patient will meet greater than or equal to 90% of their needs   MONITOR:   PO intake, Labs, Weight trends  REASON FOR ASSESSMENT:   Consult Assessment of nutrition requirement/status  ASSESSMENT:   Patient presented with volume overload and SOB from GI office for colonoscopy and admitted for acute on chronic CHF and cardiogenic shock. PMH significant for DM2, non-ischemic cardiomyopathy, pacemaker, Aflutter, ETOH abuse, liver cirrhosis, severe Fe-def anemia (was pending diagnostic colonoscopy when admitted), moderate OSA (had been awaiting delivery of CPAP), and morbid obesity.  Visited the patient who mostly kept his eyes closed and moaned intermittently as we spoke. He states he has a good appetite and had been eating well prior to admission. He tells me he was diagnosed with diabetes 3-4 months ago and has not been following an appropriate diet. He does try to watch his sodium intake. His UBW is 305 lbs and he states he has put on 40 lbs over the past 3-4 weeks. We discussed some recommendations for reducing his carbohydrate intake as well as sodium intake.  Typical food intake: Breakfast - eggs with bacon. Lunch - burrito or quesadilla. Dinner - salmon with rice and broccoli or chicken breast or lasagna.   Scheduled Meds:  Chlorhexidine  Gluconate Cloth  6 each Topical Daily   dapagliflozin  propanediol  10 mg Oral Daily   digoxin   125 mcg Oral Daily   insulin aspart  0-15 Units Subcutaneous TID WC   insulin aspart  0-5 Units Subcutaneous QHS   polyethylene glycol  17 g Oral BID   potassium  chloride  40 mEq Oral Once   senna-docusate  1 tablet Oral Daily   sodium chloride  flush  10-40 mL Intracatheter Q12H   sodium chloride  flush  3 mL Intravenous Q12H   spironolactone   25 mg Oral Daily   thiamine   100 mg Oral Daily   Continuous Infusions:  amiodarone 30 mg/hr (05/05/24 0616)   DOBUTamine 5 mcg/kg/min (05/05/24 0928)   furosemide  (LASIX ) 200 mg in dextrose  5 % 100 mL (2 mg/mL) infusion 15 mg/hr (05/05/24 0616)   heparin  1,900 Units/hr (05/05/24 0737)   promethazine (PHENERGAN) injection (IM or IVPB)     PRN Meds:.acetaminophen  **OR** acetaminophen , bisacodyl , ondansetron  **OR** ondansetron  (ZOFRAN ) IV, oxyCODONE , promethazine (PHENERGAN) injection (IM or IVPB), sodium chloride  flush  Diet Order             Diet Heart Room service appropriate? Yes; Fluid consistency: Thin  Diet effective now                  Meal Intake: 75-100%  Labs:     Latest Ref Rng & Units 05/05/2024    6:50 AM 05/04/2024    2:56 AM 05/03/2024   11:04 AM  CMP  Glucose 70 - 99 mg/dL 808  875  888   BUN 6 - 20 mg/dL 34  27  24   Creatinine 0.61 - 1.24 mg/dL 7.71  8.40  8.48   Sodium 135 - 145 mmol/L 128  135  132   Potassium 3.5 - 5.1 mmol/L 3.4  3.5  3.1   Chloride 98 -  111 mmol/L 85  96  96   CO2 22 - 32 mmol/L 25  25  25    Calcium  8.9 - 10.3 mg/dL 8.3  8.6  8.3   Total Protein 6.5 - 8.1 g/dL   7.4   Total Bilirubin 0.0 - 1.2 mg/dL   1.7   Alkaline Phos 38 - 126 U/L   63   AST 15 - 41 U/L   28   ALT 0 - 44 U/L   20   Phos and Mg WNL  I/O: -1 L since admit  NUTRITION - FOCUSED PHYSICAL EXAM:  Flowsheet Row Most Recent Value  Orbital Region No depletion  Upper Arm Region No depletion  Thoracic and Lumbar Region No depletion  Buccal Region No depletion  Temple Region No depletion  Clavicle Bone Region Unable to assess  [due to body habitus]  Clavicle and Acromion Bone Region Unable to assess  [due to body habitus]  Scapular Bone Region Unable to assess  [due to body  habitus]  Dorsal Hand No depletion  Patellar Region Unable to assess  [due to body habitus and edema]  Anterior Thigh Region Unable to assess  [due to body habitus and edema]  Posterior Calf Region Unable to assess  [due to body habitus and edema]  Edema (RD Assessment) Moderate  [+2 BLE]  Hair Reviewed  Eyes Reviewed  Mouth Reviewed  Skin Reviewed  Nails Reviewed    EDUCATION NEEDS:   Education needs have been addressed  Skin:  Skin Assessment: Reviewed RN Assessment  Last BM:  11/4  Height:   Ht Readings from Last 1 Encounters:  05/05/24 5' 6 (1.676 m)    Weight:    Ideal Body Weight:  65 kg  BMI:  Body mass index is 55.83 kg/m.  Estimated Nutritional Needs:  Kcal:  2100-2300 Protein:  130-150 Fluid:  2100-2300    Leverne Ruth, MS, RDN, LDN [place]. [facility] See AMION for contact information

## 2024-05-06 ENCOUNTER — Other Ambulatory Visit: Payer: Self-pay | Admitting: Family Medicine

## 2024-05-06 DIAGNOSIS — I5023 Acute on chronic systolic (congestive) heart failure: Secondary | ICD-10-CM | POA: Diagnosis not present

## 2024-05-06 LAB — BASIC METABOLIC PANEL WITH GFR
Anion gap: 14 (ref 5–15)
Anion gap: 12 (ref 5–15)
BUN: 26 mg/dL — ABNORMAL HIGH (ref 6–20)
BUN: 23 mg/dL — ABNORMAL HIGH (ref 6–20)
CO2: 26 mmol/L (ref 22–32)
CO2: 30 mmol/L (ref 22–32)
Calcium: 8.2 mg/dL — ABNORMAL LOW (ref 8.9–10.3)
Calcium: 8.4 mg/dL — ABNORMAL LOW (ref 8.9–10.3)
Chloride: 91 mmol/L — ABNORMAL LOW (ref 98–111)
Chloride: 92 mmol/L — ABNORMAL LOW (ref 98–111)
Creatinine, Ser: 1.81 mg/dL — ABNORMAL HIGH (ref 0.61–1.24)
Creatinine, Ser: 1.65 mg/dL — ABNORMAL HIGH (ref 0.61–1.24)
GFR, Estimated: 46 mL/min — ABNORMAL LOW (ref >60)
GFR, Estimated: 51 mL/min — ABNORMAL LOW (ref >60)
Glucose, Bld: 206 mg/dL — ABNORMAL HIGH (ref 70–99)
Glucose, Bld: 116 mg/dL — ABNORMAL HIGH (ref 70–99)
Potassium: 3.2 mmol/L — ABNORMAL LOW (ref 3.5–5.1)
Potassium: 3.2 mmol/L — ABNORMAL LOW (ref 3.5–5.1)
Sodium: 131 mmol/L — ABNORMAL LOW (ref 135–145)
Sodium: 134 mmol/L — ABNORMAL LOW (ref 135–145)

## 2024-05-06 LAB — GLUCOSE, CAPILLARY
Glucose-Capillary: 127 mg/dL — ABNORMAL HIGH (ref 70–99)
Glucose-Capillary: 110 mg/dL — ABNORMAL HIGH (ref 70–99)
Glucose-Capillary: 110 mg/dL — ABNORMAL HIGH (ref 70–99)

## 2024-05-06 LAB — COOXEMETRY PANEL
Carboxyhemoglobin: 1.3 % (ref 0.5–1.5)
Methemoglobin: <0.7 % (ref 0.0–1.5)
O2 Saturation: 49.7 %
Total hemoglobin: 9.8 g/dL — ABNORMAL LOW (ref 12.0–16.0)

## 2024-05-06 LAB — CBC
HCT: 29.8 % — ABNORMAL LOW (ref 39.0–52.0)
Hemoglobin: 9.5 g/dL — ABNORMAL LOW (ref 13.0–17.0)
MCH: 25.1 pg — ABNORMAL LOW (ref 26.0–34.0)
MCHC: 31.9 g/dL (ref 30.0–36.0)
MCV: 78.6 fL — ABNORMAL LOW (ref 80.0–100.0)
Platelets: 266 K/uL (ref 150–400)
RBC: 3.79 MIL/uL — ABNORMAL LOW (ref 4.22–5.81)
RDW: 17.5 % — ABNORMAL HIGH (ref 11.5–15.5)
WBC: 11.2 K/uL — ABNORMAL HIGH (ref 4.0–10.5)
nRBC: 0 % (ref 0.0–0.2)

## 2024-05-06 LAB — HEPARIN LEVEL (UNFRACTIONATED)
Heparin Unfractionated: 0.32 [IU]/mL (ref 0.30–0.70)
Heparin Unfractionated: 0.23 [IU]/mL — ABNORMAL LOW (ref 0.30–0.70)
Heparin Unfractionated: 0.49 [IU]/mL (ref 0.30–0.70)

## 2024-05-06 MED ORDER — SODIUM CHLORIDE 0.9 % IV SOLN
300.0000 mg | INTRAVENOUS | Status: DC
  Filled 2024-05-06: qty 15

## 2024-05-06 MED ORDER — POTASSIUM CHLORIDE CRYS ER 20 MEQ PO TBCR
40.0000 meq | EXTENDED_RELEASE_TABLET | ORAL | Status: AC
  Administered 2024-05-06 (×3): 40 meq via ORAL
  Filled 2024-05-06 (×3): qty 2

## 2024-05-06 MED ORDER — SPIRONOLACTONE 25 MG PO TABS
50.0000 mg | ORAL_TABLET | Freq: Every day | ORAL | Status: DC
  Administered 2024-05-06 – 2024-05-12 (×6): 50 mg via ORAL
  Filled 2024-05-06 (×7): qty 2

## 2024-05-06 MED ORDER — METOLAZONE 2.5 MG PO TABS
2.5000 mg | ORAL_TABLET | Freq: Once | ORAL | Status: AC
  Administered 2024-05-06: 2.5 mg via ORAL
  Filled 2024-05-06: qty 1

## 2024-05-06 MED ORDER — POTASSIUM CHLORIDE CRYS ER 20 MEQ PO TBCR
40.0000 meq | EXTENDED_RELEASE_TABLET | ORAL | Status: AC
  Administered 2024-05-06 – 2024-05-07 (×3): 40 meq via ORAL
  Filled 2024-05-06 (×3): qty 2

## 2024-05-06 MED ORDER — SODIUM CHLORIDE 0.9 % IV SOLN
500.0000 mg | INTRAVENOUS | Status: AC
  Administered 2024-05-06 – 2024-05-08 (×2): 500 mg via INTRAVENOUS
  Filled 2024-05-06 (×2): qty 25

## 2024-05-06 MED ORDER — SACUBITRIL-VALSARTAN 49-51 MG PO TABS
1.0000 | ORAL_TABLET | Freq: Two times a day (BID) | ORAL | Status: DC
  Administered 2024-05-06 – 2024-05-09 (×7): 1 via ORAL
  Filled 2024-05-06 (×7): qty 1

## 2024-05-06 MED ORDER — IRON SUCROSE 500 MG IVPB - SIMPLE MED
500.0000 mg | INTRAVENOUS | Status: DC
  Filled 2024-05-06: qty 275

## 2024-05-06 NOTE — Progress Notes (Signed)
 PHARMACY - ANTICOAGULATION CONSULT NOTE  Pharmacy Consult for heparin  Indication: atrial fibrillation  Labs: Recent Labs    05/04/24 0256 05/04/24 1059 05/05/24 1700 05/06/24 0407 05/06/24 1159 05/06/24 1400 05/06/24 2000  HGB 10.2*  --   --  9.5*  --   --   --   HCT 33.8*  --   --  29.8*  --   --   --   PLT 350  --   --  266  --   --   --   HEPARINUNFRC <0.10*   < > 0.25* 0.32 0.23*  --  0.49  CREATININE 1.59*   < > 2.02* 1.81*  --  1.65*  --    < > = values in this interval not displayed.   Assessment/Plan:  48yo male therapeutic on heparin  after rate change; heparin  has been moved to PICC, having to pause infusion to draw labs so accuracy may be affected. Will continue infusion at current rate of 2300 units/hr and confirm stable with am labs.  Marvetta Dauphin, PharmD, BCPS 05/06/2024 11:52 PM

## 2024-05-06 NOTE — TOC Initial Note (Addendum)
 Transition of Care [facility]) - Initial/Assessment Note    Patient Details  Name: Kyle Pearson MRN: [id number] Date of Birth: Oct 20, 1975  Transition of Care [facility] CM/SW Contact:    [name] Phone Number: [phone] 05/06/2024, 10:15 AM  Clinical Narrative:  HF CSW met with patient at bedside. Patient stated that he lives with his wife and children at home. Patient stated that  he has no history of HH services. Patient stated that he does not use any equipment at home. Patient stated that he has a scale at home. Patient stated that he has a PCP. CSW explained that a hospital follow up appointment is typically scheduled closer towards dc. Patient is agreeable. Patient stated his wife will provide transportation home at dc. Needs work note at [employer].   Patient inquired about medicaid and disability. CSW asked for permission to have the [facility] team screen. Patient agreeable.   HF CSW/CM will continue to follow and monitor for dc readiness.                        Patient Goals and CMS Choice            Expected Discharge Plan and Services                                              Prior Living Arrangements/Services                       Activities of Daily Living   ADL Screening (condition at time of admission) Independently performs ADLs?: Yes (appropriate for developmental age) Is the patient deaf or have difficulty hearing?: No Does the patient have difficulty seeing, even when wearing glasses/contacts?: No Does the patient have difficulty concentrating, remembering, or making decisions?: No  Permission Sought/Granted                  Emotional Assessment              Admission diagnosis:  Orthopnea [R06.01] SOB (shortness of breath) [R06.02] Dyspnea on exertion [R06.09] Acute on chronic systolic (congestive) heart failure (HCC) [I50.23] Acute on chronic congestive heart failure, unspecified heart failure type (HCC)  [I50.9] Patient Active Problem List   Diagnosis Date Noted   Acute on chronic systolic (congestive) heart failure (HCC) 05/03/2024   Hematochezia 04/27/2024   Shortness of breath 04/27/2024   Increase in serum creatinine from prior measurement 04/27/2024   Low vitamin D level 04/27/2024   Type 2 diabetes mellitus with hyperglycemia, without long-term current use of insulin (HCC) 04/27/2024   Atrial flutter (HCC) 04/27/2024   Pulmonary hypertensive venous disease (HCC) 04/27/2024   Physical exam, annual 04/21/2024   External hemorrhoid 04/21/2024   Anemia 04/21/2024   Morbid obesity (HCC) 04/21/2024   CHF (congestive heart failure) (HCC) 12/08/2020   OSA (obstructive sleep apnea) 11/14/2020   Snoring 08/19/2020   Acute exacerbation of CHF (congestive heart failure) (HCC) 05/29/2020   Obesity, Class III, BMI 40-49.9 (morbid obesity) (HCC) 05/29/2020   Other cirrhosis of liver (HCC) 05/29/2020   Leukocytosis 03/17/2020   PCP:  Kayla Jeoffrey RAMAN, FNP Pharmacy:   [id number] - [place], [place] - [address] AT [facility] OF [address] [place] [address] Phone: [phone] Fax: [phone]     Social  Drivers of Health (SDOH) Social History: SDOH Screenings   Food Insecurity: No Food Insecurity (05/04/2024)  Housing: Low Risk  (05/04/2024)  Transportation Needs: No Transportation Needs (05/04/2024)  Utilities: Not At Risk (05/04/2024)  Tobacco Use: Medium Risk (05/03/2024)   SDOH Interventions:     Readmission Risk Interventions     No data to display

## 2024-05-06 NOTE — Plan of Care (Signed)
  Problem: Coping: Goal: Ability to adjust to condition or change in health will improve Outcome: Progressing   Problem: Metabolic: Goal: Ability to maintain appropriate glucose levels will improve Outcome: Progressing   Problem: Nutritional: Goal: Maintenance of adequate nutrition will improve Outcome: Progressing   Problem: Clinical Measurements: Goal: Cardiovascular complication will be avoided Outcome: Progressing

## 2024-05-06 NOTE — Progress Notes (Addendum)
 Advanced Heart Failure Rounding Note  Cardiologist: None  Chief Complaint: Cardiogenic Shock Subjective:    Co-ox 50% on DBA 5. CVP 27 SBP 140-150s. Net negative 6L UOP on Lasix  15/hr. sCr 2.28>2.02>1.81  Lying in bed. Belly less tight. Feeling better but remains SOB with exertion. Was able to rest last night.   Objective:    Weight Range: (!) 152.6 kg Body mass index is 54.3 kg/m.   Vital Signs:   Temp:  [97.7 F (36.5 C)-98.4 F (36.9 C)] 98.1 F (36.7 C) (11/06 0454) Pulse Rate:  [48-107] 107 (11/06 0454) Resp:  [16-20] 18 (11/06 0454) BP: (98-154)/(70-121) 150/107 (11/06 0454) SpO2:  [94 %-100 %] 94 % (11/06 0454) Weight:  [152.6 kg] 152.6 kg (11/06 0454) Last BM Date : 05/06/24  Weight change: Filed Weights   05/03/24 1057 05/05/24 0500 05/06/24 0454  Weight: (!) 156 kg (!) 156.9 kg (!) 152.6 kg   Intake/Output:  Intake/Output Summary (Last 24 hours) at 05/06/2024 0715 Last data filed at 05/06/2024 0417 Gross per 24 hour  Intake 1900.79 ml  Output 7910 ml  Net -6009.21 ml    Physical Exam    General: Pale appearing. No distress on RA Cardiac: JVP to ear. S1 and S2 present. No murmurs  Abdomen: Taut, non-tender, distended.  Extremities: Warm and dry.  2+ edema. Unna boots Neuro: Alert and oriented x3. Affect pleasant.   Telemetry   AF 100-110s (personally reviewed)  Labs    CBC Recent Labs    05/03/24 1104 05/04/24 0256 05/06/24 0407  WBC 12.2* 12.4* 11.2*  NEUTROABS 8.3*  --   --   HGB 9.8* 10.2* 9.5*  HCT 31.8* 33.8* 29.8*  MCV 80.3 81.4 78.6*  PLT 331 350 266   Basic Metabolic Panel Recent Labs    88/94/74 1700 05/06/24 0407  NA 131* 131*  K 2.9* 3.2*  CL 91* 91*  CO2 29 26  GLUCOSE 126* 206*  BUN 34* 26*  CREATININE 2.02* 1.81*  CALCIUM  8.4* 8.2*   Liver Function Tests Recent Labs    05/03/24 1104  AST 28  ALT 20  ALKPHOS 63  BILITOT 1.7*  PROT 7.4  ALBUMIN 3.2*   BNP (last 3 results) Recent Labs     10/08/23 1006 03/08/24 1749 05/03/24 1104  BNP 181.5* 137.8* 836.5*   Medications:     Scheduled Medications:  Chlorhexidine  Gluconate Cloth  6 each Topical Daily   dapagliflozin  propanediol  10 mg Oral Daily   digoxin   125 mcg Oral Daily   insulin aspart  0-15 Units Subcutaneous TID WC   insulin aspart  0-5 Units Subcutaneous QHS   multivitamin with minerals  1 tablet Oral Daily   polyethylene glycol  17 g Oral BID   senna-docusate  1 tablet Oral Daily   sodium chloride  flush  10-40 mL Intracatheter Q12H   sodium chloride  flush  3 mL Intravenous Q12H   spironolactone   25 mg Oral Daily   thiamine   100 mg Oral Daily    Infusions:  amiodarone 30 mg/hr (05/06/24 0417)   DOBUTamine 5 mcg/kg/min (05/06/24 0417)   furosemide  (LASIX ) 200 mg in dextrose  5 % 100 mL (2 mg/mL) infusion 15 mg/hr (05/06/24 0417)   heparin  2,100 Units/hr (05/06/24 0417)   promethazine (PHENERGAN) injection (IM or IVPB)      PRN Medications: acetaminophen  **OR** acetaminophen , bisacodyl , ondansetron  **OR** ondansetron  (ZOFRAN ) IV, oxyCODONE , promethazine (PHENERGAN) injection (IM or IVPB), sodium chloride  flush  Assessment/Plan   Cardiogenic Shock  Acute on chronic systolic CHF: Nonischemic cardiomyopathy. [employer] ICD. Symptomatic since 9/21 (after episode of peri-tonsillar abscess).  He drinks ETOH but does not seem to describe heavy enough ETOH to cause profound cardiomyopathy (though he did have mild cirrhosis on abdominal US  at [facility]). No FH of cardiomyopathy. Echo 11/21 with EF < 20% with severe LV dilation, moderate RVE with severely decreased systolic function, severe biatrial enlargement, mild MR. [facility] 12/21 showed no coronary disease, markedly elevated filling pressures, and low cardiac output. Cardiac MRI in 11/21 showed severely dilated LV with diffuse hypokinesis, EF 17%, mod dilated RV with EF 20%, diffuse mid-wall LGE in septum, inferior wall & inferolateral wall.  LGE pattern suggests  possible prior myocarditis. Echo in 3/22 showed that EF remains < 20% with mild RV dysfunction. CPX in 4/22 showed only a mild HF limitation.  Trypanosoma cruzi antibodies were negative (had h/o neurocysticercosis), probably not cardiomyopathy related to trypanosomiasis. Echo 1/25 showed EF 20-25%, G3DD, RV mildly reduced. Repeat echo 11/4 with EF <20%, mildly reduced RV. - Admitted with NYHA IV symptoms. Compliance appears to be an issue. Had been off some medications. Suspect heart failure worsened in the setting of AFL.  - Co-ox 50% on DBA 5. 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 Restart heparin  drip.  - tsat 7; will give IV Fe   Constipation - suspect related to increased intra-abdominal pressure - check abd US  - start sennakot and PRN dulcolax/myralax   ETOH:   - He did have early cirrhosis on abdominal US  at [name]  However, with obesity could be MASH. He is cutting back his beer intake - Discussed cessation.   OSA: Moderate by sleep study 08/2020. AHI 31.1/hr - Needs to wear CPAP   Obesity:  -  on mounjaro in OP   SDOH:  - He is insured and has transportation. - Farxiga  $45/90 dayEntresto  $45/90 (can use co-pay cards for each)  Hypokalemia - increase spiro - replete for goal K>4 - BMET this pm  Length of Stay: 3  Jordan Lee, NP  05/06/2024, 7:15 AM  Advanced Heart Failure Team Pager [phone] (M-F; 7a - 5p)  Please  contact [facility] for night-coverage after hours (5p -7a ) and weekends on [email or web address]  Patient seen and examined with the above-signed Advanced Practice Provider and/or Housestaff. I personally reviewed laboratory data, imaging studies and relevant notes. I independently examined the patient and formulated the important aspects of the plan. I have edited the note to reflect any of my changes or salient points. I have personally discussed the plan with the patient and/or family.  Now on DBA> Feeling better but still feels bloated and mildly SOB  Co-ox 50% CVP 27  Remains in AF. On IV amio and heprian  General:  Sitting on side of bed. No resp difficulty HEENT: normal Neck: supple. JVP to ear  Cor: irreg tachy  Lungs: clear Abdomen: obese, soft, nontender, ++ distended.Good bowel sounds. Extremities: no cyanosis, clubbing, rash, 3+ edema Neuro: alert & orientedx3, cranial nerves grossly intact. moves all 4 extremities w/o difficulty. Affect pleasant  Remains massively volume overloaded. Co-ox improved but still low on DBA> Continue IV diuresis. Increase DBA as needed.   Once volume status improved will need TEE/DC-CV.   Follow renal function and K closely. Keep K supped.   Toribio Fuel, MD  2:59 PM

## 2024-05-06 NOTE — Progress Notes (Signed)
 PROGRESS NOTE    Kyle Pearson  FMW:[id number] DOB: 12-Nov-1975 DOA: 05/03/2024 PCP: Kayla Jeoffrey RAMAN, FNP  47/M, morbidly obese with chronic systolic CHF/NICM, paroxysmal A-fib, EtOH abuse, liver cirrhosis, OSA, presented to GI office 11/3 to discuss colonoscopy, he was noted to be significantly volume overloaded and  in atrial flutter with RVR, sent to the ED, questionable compliance, and still does not have his CPAP. - In the ED A-fib RVR, blood pressure in the 110s, BNP 836, troponin 91, creatinine 1.5, sodium 132, WBC 12.2 - 11/4 heart failure team consulting, started on Lasix  gtt. - 11/5, concern for cardiogenic shock, low output, starting dobutamine   Subjective: - Much better urine output yesterday, blood pressure stable now on dobutamine  Assessment and Plan:  Acute on chronic systolic CHF, NICM Cardiogenic shock, low output - Last echo 1/25 noted EF 20-25, grade 3 DD, mildly reduced RV, LHC 12/21 noted no CAD - Significantly volume overloaded on admission this time, concern for compliance with medications - Heart failure team following, Co. ox down to 31%, CVP 30, poor urine output, worsening kidney function - Started dobutamine 11/5, continue Lasix  gtt., brisk diuresis yesterday -Toprol  discontinued -Remains on Aldactone  Farxiga  and digoxin   Atrial flutter with RVR - Recently anticoagulation held due to concern for GI bleed - Currently on heparin  gtt. and Amio gtt. - Plan for cardioversion when volume status is improved  Severe iron deficiency anemia - Given, monitor for GI bleed on anticoagulation-on IV heparin  - Anemia panel with severe iron deficiency - Give IV iron X2 doses  Liver cirrhosis EtOH abuse - Ultrasound now with no ascites - Diuretics as noted above, EtOH cessation counseled - Thiamine , multivitamin, monitor for withdrawal  Moderate OSA - Reports that his CPAP is yet to be delivered - Order CPAP nightly, TOC consult for home CPAP  Morbid  obesity, BMI is 55.5 - On Mounjaro now  Constipation Now on laxatives, monitor   DVT prophylaxis: hep gtt Code Status: Full Family Communication: None present Disposition Plan:      Objective: Vitals:   05/05/24 2324 05/05/24 2350 05/06/24 0454 05/06/24 0745  BP:  (!) 154/93 (!) 150/107 (!) 144/97  Pulse: 99 97 (!) 107 (!) 103  Resp: 19 16 18 20   Temp:  98.4 F (36.9 C) 98.1 F (36.7 C) 97.7 F (36.5 C)  TempSrc:  Oral Oral Oral  SpO2: 99% 97% 94% 97%  Weight:   (!) 152.6 kg   Height:   5' 6 (1.676 m)     Intake/Output Summary (Last 24 hours) at 05/06/2024 1117 Last data filed at 05/06/2024 0919 Gross per 24 hour  Intake 2023.79 ml  Output 7360 ml  Net -5336.21 ml   Filed Weights   05/03/24 1057 05/05/24 0500 05/06/24 0454  Weight: (!) 156 kg (!) 156.9 kg (!) 152.6 kg    Examination:  General exam: AAO x 3, morbidly obese, appears more comfortable today Respiratory system: Decreased at the bases Cardiovascular system: S1 & S2 heard, irregular Abd: Massively distended, nontender, bowel sounds present Central nervous system: Alert and oriented. No focal neurological deficits. Extremities: 2+ edema Skin: No rashes Psychiatry:  Mood & affect appropriate.     Data Reviewed:   CBC: Recent Labs  Lab 05/03/24 1104 05/04/24 0256 05/06/24 0407  WBC 12.2* 12.4* 11.2*  NEUTROABS 8.3*  --   --   HGB 9.8* 10.2* 9.5*  HCT 31.8* 33.8* 29.8*  MCV 80.3 81.4 78.6*  PLT 331 350 266  Basic Metabolic Panel: Recent Labs  Lab 05/03/24 1104 05/04/24 0256 05/05/24 0650 05/05/24 1700 05/06/24 0407  NA 132* 135 128* 131* 131*  K 3.1* 3.5 3.4* 2.9* 3.2*  CL 96* 96* 85* 91* 91*  CO2 25 25 25 29 26   GLUCOSE 111* 124* 191* 126* 206*  BUN 24* 27* 34* 34* 26*  CREATININE 1.51* 1.59* 2.28* 2.02* 1.81*  CALCIUM  8.3* 8.6* 8.3* 8.4* 8.2*   GFR: Estimated Creatinine Clearance: 70.9 mL/min (A) (by C-G formula based on SCr of 1.81 mg/dL (H)). Liver Function  Tests: Recent Labs  Lab 05/03/24 1104  AST 28  ALT 20  ALKPHOS 63  BILITOT 1.7*  PROT 7.4  ALBUMIN 3.2*   No results for input(s): LIPASE, AMYLASE in the last 168 hours. No results for input(s): AMMONIA in the last 168 hours. Coagulation Profile: No results for input(s): INR, PROTIME in the last 168 hours. Cardiac Enzymes: No results for input(s): CKTOTAL, CKMB, CKMBINDEX, TROPONINI in the last 168 hours. BNP (last 3 results) No results for input(s): PROBNP in the last 8760 hours. HbA1C: No results for input(s): HGBA1C in the last 72 hours. CBG: Recent Labs  Lab 05/05/24 0631 05/05/24 1144 05/05/24 1534 05/05/24 2053 05/06/24 0614  GLUCAP 118* 117* 121* 90 127*   Lipid Profile: No results for input(s): CHOL, HDL, LDLCALC, TRIG, CHOLHDL, LDLDIRECT in the last 72 hours. Thyroid  Function Tests: No results for input(s): TSH, T4TOTAL, FREET4, T3FREE, THYROIDAB in the last 72 hours. Anemia Panel: Recent Labs    05/04/24 0256  FERRITIN 15*  TIBC 476*  IRON 30*   Urine analysis:    Component Value Date/Time   COLORURINE YELLOW 04/27/2024 1215   APPEARANCEUR CLEAR 04/27/2024 1215   LABSPEC 1.015 04/27/2024 1215   PHURINE 6.5 04/27/2024 1215   GLUCOSEU 3+ (A) 04/27/2024 1215   HGBUR TRACE (A) 04/27/2024 1215   BILIRUBINUR NEGATIVE 03/13/2007 1222   KETONESUR NEGATIVE 04/27/2024 1215   PROTEINUR 3+ (A) 04/27/2024 1215   UROBILINOGEN 0.2 03/13/2007 1222   NITRITE NEGATIVE 04/27/2024 1215   LEUKOCYTESUR NEGATIVE 04/27/2024 1215   Sepsis Labs: @LABRCNTIP (procalcitonin:4,lacticidven:4)  )No results found for this or any previous visit (from the past 240 hours).   Radiology Studies: DG CHEST PORT 1 VIEW Result Date: 05/04/2024 EXAM: 1 VIEW(S) XRAY OF THE CHEST 05/04/2024 06:11:00 PM COMPARISON: 05/03/2024 CLINICAL HISTORY: Status post PICC central line placement. FINDINGS: LINES, TUBES AND DEVICES: Subcutaneous  defibrillator wire projects over the left chest, unchanged. Right PICC line tip in the SVC. LUNGS AND PLEURA: No focal pulmonary opacity. No pulmonary edema. No pleural effusion. No pneumothorax. HEART AND MEDIASTINUM: Cardiomegaly. BONES AND SOFT TISSUES: No acute osseous abnormality. IMPRESSION: 1. Right PICC line tip in the SVC. 2. Cardiomegaly. Electronically signed by: Franky Crease MD 05/04/2024 06:19 PM EST RP Workstation: [id number]   US  EKG SITE RITE Result Date: 05/04/2024 If Site Rite image not attached, placement could not be confirmed due to current cardiac rhythm.    Scheduled Meds:  Chlorhexidine  Gluconate Cloth  6 each Topical Daily   dapagliflozin  propanediol  10 mg Oral Daily   digoxin   125 mcg Oral Daily   insulin aspart  0-15 Units Subcutaneous TID WC   insulin aspart  0-5 Units Subcutaneous QHS   multivitamin with minerals  1 tablet Oral Daily   polyethylene glycol  17 g Oral BID   potassium chloride   40 mEq Oral Q3H   sacubitril -valsartan   1 tablet Oral BID   senna-docusate  1 tablet Oral  Daily   sodium chloride  flush  10-40 mL Intracatheter Q12H   sodium chloride  flush  3 mL Intravenous Q12H   spironolactone   50 mg Oral Daily   thiamine   100 mg Oral Daily   Continuous Infusions:  amiodarone 30 mg/hr (05/06/24 0722)   DOBUTamine 5 mcg/kg/min (05/06/24 0754)   furosemide  (LASIX ) 200 mg in dextrose  5 % 100 mL (2 mg/mL) infusion 15 mg/hr (05/06/24 0417)   heparin  2,100 Units/hr (05/06/24 0752)   promethazine (PHENERGAN) injection (IM or IVPB)       LOS: 3 days    Time spent:    Sigurd Pac, MD [facility]   05/06/2024, 11:17 AM

## 2024-05-06 NOTE — Progress Notes (Signed)
 PHARMACY - ANTICOAGULATION CONSULT NOTE  Pharmacy Consult for heparin  Indication: atrial fibrillation  Labs: Recent Labs    05/03/24 1104 05/03/24 1304 05/04/24 0256 05/04/24 1059 05/05/24 0650 05/05/24 1700 05/06/24 0407  HGB 9.8*  --  10.2*  --   --   --  9.5*  HCT 31.8*  --  33.8*  --   --   --  29.8*  PLT 331  --  350  --   --   --  266  HEPARINUNFRC  --   --  <0.10*   < > <0.10* 0.25* 0.32  CREATININE 1.51*  --  1.59*  --  2.28* 2.02*  --   TROPONINIHS 91* 92*  --   --   --   --   --    < > = values in this interval not displayed.   Assessment/Plan:  48yo male therapeutic on heparin  after rate change; RN did note an infiltrated IV that has now been removed and re-established. Will continue infusion at current rate of 2100 units/hr and confirm stable with additional level.  Marvetta Dauphin, PharmD, BCPS 05/06/2024 4:42 AM

## 2024-05-06 NOTE — Progress Notes (Signed)
 PHARMACY - ANTICOAGULATION CONSULT NOTE  Pharmacy Consult for Heparin  Indication: atrial fibrillation  Allergies  Allergen Reactions   Raspberry Anaphylaxis   Penicillins Rash    Reaction: Childhood    Patient Measurements: Height: 5' 6 (167.6 cm) Weight: (!) 152.6 kg (336 lb 6.8 oz) IBW/kg (Calculated) : 63.8 HEPARIN  DW (KG): 101.6  Vital Signs: Temp: 97.8 F (36.6 C) (11/06 1108) Temp Source: Oral (11/06 1108) BP: 128/80 (11/06 1108) Pulse Rate: 110 (11/06 1108)  Labs: Recent Labs    05/04/24 0256 05/04/24 1059 05/05/24 0650 05/05/24 1700 05/06/24 0407 05/06/24 1159  HGB 10.2*  --   --   --  9.5*  --   HCT 33.8*  --   --   --  29.8*  --   PLT 350  --   --   --  266  --   HEPARINUNFRC <0.10*   < > <0.10* 0.25* 0.32 0.23*  CREATININE 1.59*  --  2.28* 2.02* 1.81*  --    < > = values in this interval not displayed.    Estimated Creatinine Clearance: 70.9 mL/min (A) (by C-G formula based on SCr of 1.81 mg/dL (H)).   Assessment: Patient is a 48 year old male admitted for CHF exacerbation and Afib. Per notes and patient, he was found to be in AF last month in clinic, but [facility] was held due to concern for GI bleed with anemia. Pharmacy is consulted for heparin .   Heparin  level sub-therapeutic this afternoon.  No known issues with IV infusion.  No overt bleeding or complications noted.  Goal of Therapy:  Heparin  level 0.3-0.7 units/ml Monitor platelets by anticoagulation protocol: Yes   Plan:  Increase heparin  2300 units/hr Repeat heparin  level in 6 hrs. Daily HL and CBC  Monitor for s/sx of bleeding  Harlene Barlow, [employer], [facility] Clinical Pharmacist  05/06/2024 2:06 PM   [facility] pharmacy phone numbers are listed on [email or web address]

## 2024-05-07 DIAGNOSIS — I5023 Acute on chronic systolic (congestive) heart failure: Secondary | ICD-10-CM | POA: Diagnosis not present

## 2024-05-07 LAB — CBC
HCT: 26.5 % — ABNORMAL LOW (ref 39.0–52.0)
Hemoglobin: 8.3 g/dL — ABNORMAL LOW (ref 13.0–17.0)
MCH: 24.8 pg — ABNORMAL LOW (ref 26.0–34.0)
MCHC: 31.3 g/dL (ref 30.0–36.0)
MCV: 79.1 fL — ABNORMAL LOW (ref 80.0–100.0)
Platelets: 233 K/uL (ref 150–400)
RBC: 3.35 MIL/uL — ABNORMAL LOW (ref 4.22–5.81)
RDW: 17.7 % — ABNORMAL HIGH (ref 11.5–15.5)
WBC: 9.7 K/uL (ref 4.0–10.5)
nRBC: 0.2 % (ref 0.0–0.2)

## 2024-05-07 LAB — GLUCOSE, CAPILLARY
Glucose-Capillary: 143 mg/dL — ABNORMAL HIGH (ref 70–99)
Glucose-Capillary: 118 mg/dL — ABNORMAL HIGH (ref 70–99)
Glucose-Capillary: 121 mg/dL — ABNORMAL HIGH (ref 70–99)
Glucose-Capillary: 141 mg/dL — ABNORMAL HIGH (ref 70–99)
Glucose-Capillary: 194 mg/dL — ABNORMAL HIGH (ref 70–99)

## 2024-05-07 LAB — COOXEMETRY PANEL
Carboxyhemoglobin: 2.8 % — ABNORMAL HIGH (ref 0.5–1.5)
Carboxyhemoglobin: 2.5 % — ABNORMAL HIGH (ref 0.5–1.5)
Methemoglobin: <0.7 % (ref 0.0–1.5)
Methemoglobin: <0.7 % (ref 0.0–1.5)
O2 Saturation: 90.8 %
O2 Saturation: 68.2 %
Total hemoglobin: 9.8 g/dL — ABNORMAL LOW (ref 12.0–16.0)
Total hemoglobin: 10.5 g/dL — ABNORMAL LOW (ref 12.0–16.0)

## 2024-05-07 LAB — BASIC METABOLIC PANEL WITH GFR
Anion gap: 15 (ref 5–15)
BUN: 19 mg/dL (ref 6–20)
CO2: 32 mmol/L (ref 22–32)
Calcium: 8.3 mg/dL — ABNORMAL LOW (ref 8.9–10.3)
Chloride: 86 mmol/L — ABNORMAL LOW (ref 98–111)
Creatinine, Ser: 1.64 mg/dL — ABNORMAL HIGH (ref 0.61–1.24)
GFR, Estimated: 52 mL/min — ABNORMAL LOW (ref >60)
Glucose, Bld: 253 mg/dL — ABNORMAL HIGH (ref 70–99)
Potassium: 3.3 mmol/L — ABNORMAL LOW (ref 3.5–5.1)
Sodium: 133 mmol/L — ABNORMAL LOW (ref 135–145)

## 2024-05-07 LAB — MAGNESIUM: Magnesium: 1.4 mg/dL — ABNORMAL LOW (ref 1.7–2.4)

## 2024-05-07 LAB — HEPARIN LEVEL (UNFRACTIONATED): Heparin Unfractionated: 0.34 [IU]/mL (ref 0.30–0.70)

## 2024-05-07 MED ORDER — ACETAZOLAMIDE 250 MG PO TABS
500.0000 mg | ORAL_TABLET | Freq: Once | ORAL | Status: AC
  Administered 2024-05-07: 500 mg via ORAL
  Filled 2024-05-07: qty 2

## 2024-05-07 MED ORDER — MAGNESIUM SULFATE 4 GM/100ML IV SOLN
4.0000 g | Freq: Once | INTRAVENOUS | Status: AC
  Administered 2024-05-07: 4 g via INTRAVENOUS
  Filled 2024-05-07: qty 100

## 2024-05-07 MED ORDER — MAGNESIUM SULFATE 2 GM/50ML IV SOLN
2.0000 g | Freq: Once | INTRAVENOUS | Status: AC
  Administered 2024-05-07: 2 g via INTRAVENOUS
  Filled 2024-05-07: qty 50

## 2024-05-07 MED ORDER — APIXABAN 5 MG PO TABS
5.0000 mg | ORAL_TABLET | Freq: Two times a day (BID) | ORAL | Status: DC
  Administered 2024-05-07 – 2024-05-12 (×11): 5 mg via ORAL
  Filled 2024-05-07 (×11): qty 1

## 2024-05-07 MED ORDER — POTASSIUM CHLORIDE CRYS ER 20 MEQ PO TBCR
40.0000 meq | EXTENDED_RELEASE_TABLET | ORAL | Status: AC
  Administered 2024-05-07 – 2024-05-08 (×4): 40 meq via ORAL
  Filled 2024-05-07 (×4): qty 2

## 2024-05-07 NOTE — Plan of Care (Signed)
  Problem: Coping: Goal: Ability to adjust to condition or change in health will improve Outcome: Progressing   Problem: Fluid Volume: Goal: Ability to maintain a balanced intake and output will improve Outcome: Progressing   Problem: Health Behavior/Discharge Planning: Goal: Ability to manage health-related needs will improve Outcome: Progressing   Problem: Metabolic: Goal: Ability to maintain appropriate glucose levels will improve Outcome: Progressing   Problem: Nutritional: Goal: Maintenance of adequate nutrition will improve Outcome: Progressing   Problem: Tissue Perfusion: Goal: Adequacy of tissue perfusion will improve Outcome: Progressing

## 2024-05-07 NOTE — Progress Notes (Addendum)
 Advanced Heart Failure Rounding Note  Cardiologist: None  Chief Complaint: Cardiogenic Shock Subjective:    Co-ox 68% on DBA 5. CVP 17 SBP improved. Net negative 4.4L UOP on Lasix  15/hr. 2.02>1.81>pending  Sitting up in bed. Feeling better this morning. Not sleeping too well overnight. SOB improving, still feels bloated. Not compliant with CPAP mask.  Objective:    Weight Range: (!) 152.6 kg Body mass index is 54.3 kg/m.   Vital Signs:   Temp:  [97.7 F (36.5 C)-98.1 F (36.7 C)] 97.9 F (36.6 C) (11/07 0902) Pulse Rate:  [82-110] 100 (11/07 0902) Resp:  [16-20] 20 (11/07 0902) BP: (98-128)/(57-80) 98/64 (11/07 0902) SpO2:  [92 %-98 %] 93 % (11/07 0902) FiO2 (%):  [21 %] 21 % (11/06 2252) Last BM Date : 05/06/24  Weight change: Filed Weights   05/03/24 1057 05/05/24 0500 05/06/24 0454  Weight: (!) 156 kg (!) 156.9 kg (!) 152.6 kg   Intake/Output:  Intake/Output Summary (Last 24 hours) at 05/07/2024 1103 Last data filed at 05/07/2024 0934 Gross per 24 hour  Intake 2926.15 ml  Output 7850 ml  Net -4923.85 ml    Physical Exam    General: Obese appearing. No distress  Cardiac: JVP difficult to assess. S1 and S2 present. No murmurs  Abdomen: Taut, non-tender, distended.  Extremities: Warm and dry.  1+ BLE edema.  Neuro: Alert and oriented x3. Affect pleasant.   Telemetry   AF 70-80s (personally reviewed)  Labs    CBC Recent Labs    05/06/24 0407  WBC 11.2*  HGB 9.5*  HCT 29.8*  MCV 78.6*  PLT 266   Basic Metabolic Panel Recent Labs    88/93/74 0407 05/06/24 1400  NA 131* 134*  K 3.2* 3.2*  CL 91* 92*  CO2 26 30  GLUCOSE 206* 116*  BUN 26* 23*  CREATININE 1.81* 1.65*  CALCIUM  8.2* 8.4*   Liver Function Tests No results for input(s): AST, ALT, ALKPHOS, BILITOT, PROT, ALBUMIN in the last 72 hours.  BNP (last 3 results) Recent Labs    10/08/23 1006 03/08/24 1749 05/03/24 1104  BNP 181.5* 137.8* 836.5*   Medications:      Scheduled Medications:  Chlorhexidine  Gluconate Cloth  6 each Topical Daily   dapagliflozin  propanediol  10 mg Oral Daily   digoxin   125 mcg Oral Daily   insulin aspart  0-15 Units Subcutaneous TID WC   insulin aspart  0-5 Units Subcutaneous QHS   multivitamin with minerals  1 tablet Oral Daily   polyethylene glycol  17 g Oral BID   sacubitril -valsartan   1 tablet Oral BID   senna-docusate  1 tablet Oral Daily   sodium chloride  flush  10-40 mL Intracatheter Q12H   sodium chloride  flush  3 mL Intravenous Q12H   spironolactone   50 mg Oral Daily   thiamine   100 mg Oral Daily    Infusions:  amiodarone 30 mg/hr (05/07/24 0621)   DOBUTamine 5 mcg/kg/min (05/07/24 0605)   furosemide  (LASIX ) 200 mg in dextrose  5 % 100 mL (2 mg/mL) infusion 15 mg/hr (05/07/24 0605)   heparin  2,300 Units/hr (05/07/24 0619)   iron sucrose (VENOFER) 500 mg in sodium chloride  0.9 % 250 mL IVPB 500 mg (05/06/24 1639)   promethazine (PHENERGAN) injection (IM or IVPB)      PRN Medications: acetaminophen  **OR** acetaminophen , bisacodyl , ondansetron  **OR** ondansetron  (ZOFRAN ) IV, oxyCODONE , promethazine (PHENERGAN) injection (IM or IVPB), sodium chloride  flush  Assessment/Plan   Cardiogenic Shock  Acute on chronic systolic  CHF: Nonischemic cardiomyopathy. [employer] ICD. Symptomatic since 9/21 (after episode of peri-tonsillar abscess).  He drinks ETOH but does not seem to describe heavy enough ETOH to cause profound cardiomyopathy (though he did have mild cirrhosis on abdominal US  at [facility]). No FH of cardiomyopathy. Echo 11/21 with EF < 20% with severe LV dilation, moderate RVE with severely decreased systolic function, severe biatrial enlargement, mild MR. [facility] 12/21 showed no coronary disease, markedly elevated filling pressures, and low cardiac output. Cardiac MRI in 11/21 showed severely dilated LV with diffuse hypokinesis, EF 17%, mod dilated RV with EF 20%, diffuse mid-wall LGE in septum, inferior wall  & inferolateral wall.  LGE pattern suggests possible prior myocarditis. Echo in 3/22 showed that EF remains < 20% with mild RV dysfunction. CPX in 4/22 showed only a mild HF limitation.  Trypanosoma cruzi antibodies were negative (had h/o neurocysticercosis), probably not cardiomyopathy related to trypanosomiasis. Echo 1/25 showed EF 20-25%, G3DD, RV mildly reduced. Repeat echo 11/4 with EF <20%, mildly reduced RV. - Admitted with NYHA IV symptoms. Compliance appears to be an issue. Had been off some medications. Suspect heart failure worsened in the setting of AFL.  - Co-ox 68% on DBA 5. CVP 17 - continue lasix  15/hr. BMET pending - continue farxiga  10 mg daily - continue spiro to 50 mg daily to assist with K, may need to increase if K low again - continue digoxin  0.125 daily - continue entresto  49/51 mg bid, Cr improving with DBA and diuresis - stop toprol  with low output - TEE/DCCV once euvolemic, tentatively on Monday   Paroxysmal AFL - new as of ICM remote monitoring 04/17/24; see on ECG by PCP - on heparin ; switch to eliquis - continue amio 30/hr (no bolus) for rate control - TEE/DCCV once euvolemic, tentatively on Monday   Anemia - PCP referred to GI due to concern for GIB - hgb stable today. Switch to eliquis - tsat 7; given IV Fe   Constipation - suspect related to increased intra-abdominal pressure - abd US  no ascites - sennakot and PRN dulcolax/myralax   ETOH:   - He did have early cirrhosis on abdominal US  at [facility].  However, with obesity could be MASH. He is cutting back his beer intake - Discussed cessation.   OSA: Moderate by sleep study 08/2020. AHI 31.1/hr - Needs to wear CPAP   Obesity:  -  on mounjaro in OP   SDOH:  - He is insured and has transportation. - Farxiga  $45/90 dayEntresto  $45/90 (can use co-pay cards for each)  Hypokalemia - increase spiro - replete for goal K>4 - BMET this pm  Length of Stay: 4  Jordan Lee, NP  05/07/2024, 11:03  AM  Advanced Heart Failure Team Pager [phone] (M-F; 7a - 5p)  Please contact [facility] for night-coverage after hours (5p -7a ) and weekends on [email or web address]  Patient seen and examined with the above-signed Advanced Practice Provider and/or Housestaff. I personally reviewed laboratory data, imaging studies and relevant notes. I independently examined the patient and formulated the important aspects of the plan. I have edited the note to reflect any of my changes or salient points. I have personally discussed the plan with the patient and/or family.   Remains on DBA and lasix  gtt. Diuresing well. Out 7.2L overnight. Starting to feel better.  Remains on IV amio and heparin  for AF.  Rates still a bit fast. Scr stable K low   General:  Sitting up on side of bed. No resp  difficulty HEENT: normal Neck: supple.  JVP to ear Cor: irreg tachy Lungs: clear Abdomen: obese soft, nontender, nondistended.Good bowel sounds. Extremities: no cyanosis, clubbing, rash, 2+ edema Neuro: alert & orientedx3, cranial nerves grossly intact. moves all 4 extremities w/o difficulty. Affect pleasant  Shock physiology resolved on DBA. Volume status improving. Still overloaded. Continue DBA and IV lasix   Plan TEE/DC-CV Monday after fully diuresed. Wean DBA once euvolemic and back in NSR.    Supp K   Lyndon Chenoweth, MD  11:21 AM

## 2024-05-07 NOTE — Progress Notes (Signed)
 PROGRESS NOTE    Kyle Pearson  FMW:[id number] DOB: September 07, 1975 DOA: 05/03/2024 PCP: Kayla Jeoffrey RAMAN, FNP  47/M, morbidly obese with chronic systolic CHF/NICM, paroxysmal A-fib, EtOH abuse, liver cirrhosis, OSA, presented to GI office 11/3 to discuss colonoscopy, he was noted to be significantly volume overloaded and  in atrial flutter with RVR, sent to the ED, questionable compliance, and still does not have his CPAP. - In the ED A-fib RVR, blood pressure in the 110s, BNP 836, troponin 91, creatinine 1.5, sodium 132, WBC 12.2 - 11/4 heart failure team consulting, started on Lasix  gtt. - 11/5, concern for cardiogenic shock, low output, starting dobutamine   Subjective: - Feels better overall, abdomen still distended but improving, ambulated in the room yesterday  Assessment and Plan:  Acute on chronic systolic CHF, NICM Cardiogenic shock, low output - Last echo 1/25 noted EF 20-25, grade 3 DD, mildly reduced RV, LHC 12/21 noted no CAD - Significantly volume overloaded on admission this time, concern for compliance with medications - Heart failure team following, Co. ox down to 31%, CVP 30, poor urine output, worsening kidney function - Started dobutamine 11/5, continue Lasix  gtt., brisk diuresis yesterday -Toprol  discontinued -Remains on Aldactone  Farxiga  and digoxin   Atrial flutter with RVR - Recently anticoagulation held due to concern for GI bleed - Currently on heparin  gtt. and Amio gtt. - Plan for cardioversion when volume status is improved  Severe iron deficiency anemia - Given, monitor for GI bleed on anticoagulation-on IV heparin  - Anemia panel with severe iron deficiency - Give IV iron X2 doses  Liver cirrhosis EtOH abuse - Ultrasound now with no ascites - Diuretics as noted above, EtOH cessation counseled - Thiamine , multivitamin, monitor for withdrawal  Moderate OSA - Reports that his CPAP is yet to be delivered - Order CPAP nightly, TOC consult for home  CPAP  Morbid obesity, BMI is 55.5 - On Mounjaro now  Constipation Now on laxatives, monitor   DVT prophylaxis: hep gtt Code Status: Full Family Communication: None present Disposition Plan:      Objective: Vitals:   05/07/24 0012 05/07/24 0624 05/07/24 0902 05/07/24 1131  BP: (!) 104/57 103/60 98/64 116/72  Pulse: 95 82 100 98  Resp: 19 16 20 19   Temp: 97.7 F (36.5 C) 98.1 F (36.7 C) 97.9 F (36.6 C) 97.8 F (36.6 C)  TempSrc: Oral Oral Oral Oral  SpO2:  96% 93% 96%  Weight:      Height:        Intake/Output Summary (Last 24 hours) at 05/07/2024 1142 Last data filed at 05/07/2024 1133 Gross per 24 hour  Intake 2926.15 ml  Output 8150 ml  Net -5223.85 ml   Filed Weights   05/03/24 1057 05/05/24 0500 05/06/24 0454  Weight: (!) 156 kg (!) 156.9 kg (!) 152.6 kg    Examination:  General exam: AAO x 3, morbidly obese, no distress Respiratory system: Decreased at the bases Cardiovascular system: S1 & S2 heard, irregular Abd: distended, nontender, bowel sounds present Central nervous system: Alert and oriented. No focal neurological deficits. Extremities: 2+ edema, Unna boots on Skin: No rashes Psychiatry:  Mood & affect appropriate.     Data Reviewed:   CBC: Recent Labs  Lab 05/03/24 1104 05/04/24 0256 05/06/24 0407 05/07/24 1101  WBC 12.2* 12.4* 11.2* 9.7  NEUTROABS 8.3*  --   --   --   HGB 9.8* 10.2* 9.5* 8.3*  HCT 31.8* 33.8* 29.8* 26.5*  MCV 80.3 81.4 78.6* 79.1*  PLT  331 350 266 233   Basic Metabolic Panel: Recent Labs  Lab 05/04/24 0256 05/05/24 0650 05/05/24 1700 05/06/24 0407 05/06/24 1400  NA 135 128* 131* 131* 134*  K 3.5 3.4* 2.9* 3.2* 3.2*  CL 96* 85* 91* 91* 92*  CO2 25 25 29 26 30   GLUCOSE 124* 191* 126* 206* 116*  BUN 27* 34* 34* 26* 23*  CREATININE 1.59* 2.28* 2.02* 1.81* 1.65*  CALCIUM  8.6* 8.3* 8.4* 8.2* 8.4*   GFR: Estimated Creatinine Clearance: 77.7 mL/min (A) (by C-G formula based on SCr of 1.65 mg/dL  (H)). Liver Function Tests: Recent Labs  Lab 05/03/24 1104  AST 28  ALT 20  ALKPHOS 63  BILITOT 1.7*  PROT 7.4  ALBUMIN 3.2*   No results for input(s): LIPASE, AMYLASE in the last 168 hours. No results for input(s): AMMONIA in the last 168 hours. Coagulation Profile: No results for input(s): INR, PROTIME in the last 168 hours. Cardiac Enzymes: No results for input(s): CKTOTAL, CKMB, CKMBINDEX, TROPONINI in the last 168 hours. BNP (last 3 results) No results for input(s): PROBNP in the last 8760 hours. HbA1C: No results for input(s): HGBA1C in the last 72 hours. CBG: Recent Labs  Lab 05/06/24 1110 05/06/24 1507 05/06/24 2138 05/07/24 0607 05/07/24 1129  GLUCAP 110* 110* 143* 118* 121*   Lipid Profile: No results for input(s): CHOL, HDL, LDLCALC, TRIG, CHOLHDL, LDLDIRECT in the last 72 hours. Thyroid  Function Tests: No results for input(s): TSH, T4TOTAL, FREET4, T3FREE, THYROIDAB in the last 72 hours. Anemia Panel: No results for input(s): VITAMINB12, FOLATE, FERRITIN, TIBC, IRON, RETICCTPCT in the last 72 hours.  Urine analysis:    Component Value Date/Time   COLORURINE YELLOW 04/27/2024 1215   APPEARANCEUR CLEAR 04/27/2024 1215   LABSPEC 1.015 04/27/2024 1215   PHURINE 6.5 04/27/2024 1215   GLUCOSEU 3+ (A) 04/27/2024 1215   HGBUR TRACE (A) 04/27/2024 1215   BILIRUBINUR NEGATIVE 03/13/2007 1222   KETONESUR NEGATIVE 04/27/2024 1215   PROTEINUR 3+ (A) 04/27/2024 1215   UROBILINOGEN 0.2 03/13/2007 1222   NITRITE NEGATIVE 04/27/2024 1215   LEUKOCYTESUR NEGATIVE 04/27/2024 1215   Sepsis Labs: @LABRCNTIP (procalcitonin:4,lacticidven:4)  )No results found for this or any previous visit (from the past 240 hours).   Radiology Studies: No results found.    Scheduled Meds:  apixaban  5 mg Oral BID   Chlorhexidine  Gluconate Cloth  6 each Topical Daily   dapagliflozin  propanediol  10 mg Oral Daily   digoxin    125 mcg Oral Daily   insulin aspart  0-15 Units Subcutaneous TID WC   insulin aspart  0-5 Units Subcutaneous QHS   multivitamin with minerals  1 tablet Oral Daily   polyethylene glycol  17 g Oral BID   sacubitril -valsartan   1 tablet Oral BID   senna-docusate  1 tablet Oral Daily   sodium chloride  flush  10-40 mL Intracatheter Q12H   sodium chloride  flush  3 mL Intravenous Q12H   spironolactone   50 mg Oral Daily   thiamine   100 mg Oral Daily   Continuous Infusions:  amiodarone 30 mg/hr (05/07/24 0621)   DOBUTamine 5 mcg/kg/min (05/07/24 0605)   furosemide  (LASIX ) 200 mg in dextrose  5 % 100 mL (2 mg/mL) infusion 15 mg/hr (05/07/24 0605)   iron sucrose (VENOFER) 500 mg in sodium chloride  0.9 % 250 mL IVPB 500 mg (05/06/24 1639)   promethazine (PHENERGAN) injection (IM or IVPB)       LOS: 4 days    Time spent:    Seattle Dalporto  Fairy, MD [facility]   05/07/2024, 11:42 AM

## 2024-05-07 NOTE — TOC Initial Note (Signed)
 Transition of Care [facility]) - Initial/Assessment Note    Patient Details  Name: Kyle Pearson MRN: [id number] Date of Birth: 25-Sep-1975  [facility]) CM/SW Contact:    Justina Delcia Czar, RN Phone Number: [phone] 05/07/2024, 5:21 PM  Clinical Narrative:                 Spoke to pt and states he received CPAP supplies 8 months ago but not CPAP. Pt will have his wife check to see the DME provider.   Per chart review, last Sleep study was completed in 2022, attending updated.   Expected Discharge Plan: Home/Self Care Barriers to Discharge: Continued Medical Work up   Patient Goals and CMS Choice Patient states their goals for this hospitalization and ongoing recovery are:: wants to recover     Expected Discharge Plan and Services   Discharge Planning Services: CM Consult   Living arrangements for the past 2 months: Single Family Home                   Prior Living Arrangements/Services Living arrangements for the past 2 months: Single Family Home Lives with:: Spouse Patient language and need for interpreter reviewed:: Yes Do you feel safe going back to the place where you live?: Yes      Need for Family Participation in Patient Care: No (Comment) Care giver support system in place?: No (comment)   Criminal Activity/Legal Involvement Pertinent to Current Situation/Hospitalization: No - Comment as needed  Activities of Daily Living   ADL Screening (condition at time of admission) Independently performs ADLs?: Yes (appropriate for developmental age) Is the patient deaf or have difficulty hearing?: No Does the patient have difficulty seeing, even when wearing glasses/contacts?: No Does the patient have difficulty concentrating, remembering, or making decisions?: No  Permission Sought/Granted Permission sought to share information with : Case Manager, Family Supports, PCP Permission granted to share information with : Yes, Verbal Permission  Granted  Share Information with NAME: Ike Jubilee  Permission granted to share info w AGENCY: DME, PCP  Permission granted to share info w Relationship: wife  Permission granted to share info w Contact Information: [phone]  Emotional Assessment   Attitude/Demeanor/Rapport: Engaged Affect (typically observed): Accepting Orientation: : Oriented to Self, Oriented to Place, Oriented to  Time, Oriented to Situation   Psych Involvement: No (comment)  Admission diagnosis:  Orthopnea [R06.01] SOB (shortness of breath) [R06.02] Dyspnea on exertion [R06.09] Acute on chronic systolic (congestive) heart failure (HCC) [I50.23] Acute on chronic congestive heart failure, unspecified heart failure type [facility]) [I50.9] Patient Active Problem List   Diagnosis Date Noted   Acute on chronic systolic (congestive) heart failure (HCC) 05/03/2024   Hematochezia 04/27/2024   Shortness of breath 04/27/2024   Increase in serum creatinine from prior measurement 04/27/2024   Low vitamin D level 04/27/2024   Type 2 diabetes mellitus with hyperglycemia, without long-term current use of insulin (HCC) 04/27/2024   Atrial flutter (HCC) 04/27/2024   Pulmonary hypertensive venous disease (HCC) 04/27/2024   Physical exam, annual 04/21/2024   External hemorrhoid 04/21/2024   Anemia 04/21/2024   Morbid obesity (HCC) 04/21/2024   CHF (congestive heart failure) (HCC) 12/08/2020   OSA (obstructive sleep apnea) 11/14/2020   Snoring 08/19/2020   Acute exacerbation of CHF (congestive heart failure) (HCC) 05/29/2020   Obesity, Class III, BMI 40-49.9 (morbid obesity) (HCC) 05/29/2020   Other cirrhosis of liver (HCC) 05/29/2020   Leukocytosis 03/17/2020   PCP:  Kayla Jeoffrey RAMAN,  FNP Pharmacy:   [id number] [address], [place] - [address] AT [facility] [address] [place] [address] Phone: [phone] Fax: [phone]     Social Drivers of Health  (SDOH) Social History: SDOH Screenings   Food Insecurity: No Food Insecurity (05/04/2024)  Housing: Low Risk  (05/04/2024)  Transportation Needs: No Transportation Needs (05/04/2024)  Utilities: Not At Risk (05/04/2024)  Tobacco Use: Medium Risk (05/03/2024)   SDOH Interventions:     Readmission Risk Interventions     No data to display

## 2024-05-07 NOTE — Plan of Care (Signed)
  Problem: Coping: Goal: Ability to adjust to condition or change in health will improve Outcome: Progressing   Problem: Metabolic: Goal: Ability to maintain appropriate glucose levels will improve Outcome: Progressing   Problem: Nutritional: Goal: Maintenance of adequate nutrition will improve Outcome: Progressing   Problem: Clinical Measurements: Goal: Will remain free from infection Outcome: Progressing   Problem: Activity: Goal: Risk for activity intolerance will decrease Outcome: Progressing

## 2024-05-07 NOTE — Progress Notes (Signed)
 PHARMACY - ANTICOAGULATION CONSULT NOTE  Pharmacy Consult for Heparin  Indication: atrial fibrillation  Allergies  Allergen Reactions   Raspberry Anaphylaxis   Penicillins Rash    Reaction: Childhood    Patient Measurements: Height: 5' 6 (167.6 cm) Weight: (!) 152.6 kg (336 lb 6.8 oz) IBW/kg (Calculated) : 63.8 HEPARIN  DW (KG): 101.6  Vital Signs: Temp: 97.8 F (36.6 C) (11/07 1131) Temp Source: Oral (11/07 1131) BP: 116/72 (11/07 1131) Pulse Rate: 98 (11/07 1131)  Labs: Recent Labs    05/06/24 0407 05/06/24 1159 05/06/24 1400 05/06/24 2000 05/07/24 1059 05/07/24 1100 05/07/24 1101  HGB 9.5*  --   --   --   --   --  8.3*  HCT 29.8*  --   --   --   --   --  26.5*  PLT 266  --   --   --   --   --  233  HEPARINUNFRC 0.32 0.23*  --  0.49  --  0.34  --   CREATININE 1.81*  --  1.65*  --  1.64*  --   --     Estimated Creatinine Clearance: 78.2 mL/min (A) (by C-G formula based on SCr of 1.64 mg/dL (H)).   Assessment: Patient is a 48 year old male admitted for CHF exacerbation and Afib. Per notes and patient, he was found to be in AF last month in clinic, but [facility] was held due to concern for GI bleed with anemia. Pharmacy is consulted for heparin .   Heparin  level within goal range this AM.  No overt bleeding or complications noted.  Goal of Therapy:  Heparin  level 0.3-0.7 units/ml Monitor platelets by anticoagulation protocol: Yes   Plan:  Pharmacy asked to change IV heparin  to apixaban today. Apixaban 5 mg po BID.  Harlene Barlow, Berdine [employer], BCCP Clinical Pharmacist  05/07/2024 2:40 PM   [facility] pharmacy phone numbers are listed on [email or web address]

## 2024-05-08 ENCOUNTER — Other Ambulatory Visit (HOSPITAL_COMMUNITY): Payer: Self-pay

## 2024-05-08 DIAGNOSIS — I5023 Acute on chronic systolic (congestive) heart failure: Secondary | ICD-10-CM | POA: Diagnosis not present

## 2024-05-08 DIAGNOSIS — R570 Cardiogenic shock: Secondary | ICD-10-CM | POA: Diagnosis not present

## 2024-05-08 LAB — GLUCOSE, CAPILLARY
Glucose-Capillary: 133 mg/dL — ABNORMAL HIGH (ref 70–99)
Glucose-Capillary: 129 mg/dL — ABNORMAL HIGH (ref 70–99)
Glucose-Capillary: 147 mg/dL — ABNORMAL HIGH (ref 70–99)
Glucose-Capillary: 128 mg/dL — ABNORMAL HIGH (ref 70–99)

## 2024-05-08 LAB — BASIC METABOLIC PANEL WITH GFR
Anion gap: 11 (ref 5–15)
BUN: 19 mg/dL (ref 6–20)
CO2: 34 mmol/L — ABNORMAL HIGH (ref 22–32)
Calcium: 8.6 mg/dL — ABNORMAL LOW (ref 8.9–10.3)
Chloride: 86 mmol/L — ABNORMAL LOW (ref 98–111)
Creatinine, Ser: 1.63 mg/dL — ABNORMAL HIGH (ref 0.61–1.24)
GFR, Estimated: 52 mL/min — ABNORMAL LOW (ref >60)
Glucose, Bld: 103 mg/dL — ABNORMAL HIGH (ref 70–99)
Potassium: 3.7 mmol/L (ref 3.5–5.1)
Sodium: 131 mmol/L — ABNORMAL LOW (ref 135–145)

## 2024-05-08 LAB — CBC
HCT: 32.3 % — ABNORMAL LOW (ref 39.0–52.0)
Hemoglobin: 10.3 g/dL — ABNORMAL LOW (ref 13.0–17.0)
MCH: 24.7 pg — ABNORMAL LOW (ref 26.0–34.0)
MCHC: 31.9 g/dL (ref 30.0–36.0)
MCV: 77.5 fL — ABNORMAL LOW (ref 80.0–100.0)
Platelets: 309 K/uL (ref 150–400)
RBC: 4.17 MIL/uL — ABNORMAL LOW (ref 4.22–5.81)
RDW: 17.9 % — ABNORMAL HIGH (ref 11.5–15.5)
WBC: 11.4 K/uL — ABNORMAL HIGH (ref 4.0–10.5)
nRBC: 0.4 % — ABNORMAL HIGH (ref 0.0–0.2)

## 2024-05-08 LAB — COOXEMETRY PANEL
Carboxyhemoglobin: 1.7 % — ABNORMAL HIGH (ref 0.5–1.5)
Methemoglobin: <0.7 % (ref 0.0–1.5)
O2 Saturation: 62.9 %
Total hemoglobin: 11.2 g/dL — ABNORMAL LOW (ref 12.0–16.0)

## 2024-05-08 LAB — MAGNESIUM: Magnesium: 2.3 mg/dL (ref 1.7–2.4)

## 2024-05-08 MED ORDER — MELATONIN 5 MG PO TABS
5.0000 mg | ORAL_TABLET | Freq: Every day | ORAL | Status: DC
  Administered 2024-05-08 – 2024-05-11 (×4): 5 mg via ORAL
  Filled 2024-05-08 (×4): qty 1

## 2024-05-08 MED ORDER — POTASSIUM CHLORIDE 20 MEQ PO PACK
40.0000 meq | PACK | ORAL | Status: AC
  Administered 2024-05-08 (×2): 40 meq via ORAL
  Filled 2024-05-08 (×2): qty 2

## 2024-05-08 MED ORDER — ACETAZOLAMIDE SODIUM 500 MG IJ SOLR
500.0000 mg | Freq: Once | INTRAMUSCULAR | Status: AC
  Administered 2024-05-08: 500 mg via INTRAVENOUS
  Filled 2024-05-08: qty 500

## 2024-05-08 MED ORDER — STERILE WATER FOR INJECTION IJ SOLN
INTRAMUSCULAR | Status: AC
  Administered 2024-05-08: 5 mL
  Filled 2024-05-08: qty 10

## 2024-05-08 NOTE — Progress Notes (Addendum)
 PROGRESS NOTE    Kyle Pearson  FMW:[id number] DOB: 1976-03-12 DOA: 05/03/2024 PCP: Kayla Jeoffrey RAMAN, FNP  47/M, morbidly obese with chronic systolic CHF/NICM, paroxysmal A-fib, EtOH abuse, liver cirrhosis, OSA, presented to GI office 11/3 to discuss colonoscopy, he was noted to be significantly volume overloaded and  in atrial flutter with RVR, sent to the ED, questionable compliance, and still does not have his CPAP. - In the ED A-fib RVR, blood pressure in the 110s, BNP 836, troponin 91, creatinine 1.5, sodium 132, WBC 12.2 - 11/4 heart failure team consulting, started on Lasix  gtt. - 11/5, concern for cardiogenic shock, low output, starting dobutamine -11/6: Brisk diuresis now   Subjective: - Some dizziness yesterday, overall feels fair, used CPAP last night, breathing and swelling continues to improve  Assessment and Plan:  Acute on chronic systolic CHF, NICM Cardiogenic shock, low output - Last echo 1/25 noted EF 20-25, grade 3 DD, mildly reduced RV, LHC 12/21 noted no CAD - Significantly volume overloaded on admission this time, concern for compliance with medications - Heart failure team following, Co. ox down to 31%, CVP 30, poor urine output, worsening kidney function - Started dobutamine 11/5, diuresing well on Lasix  gtt., 15 L negative, volume status improving?  Decrease diuretics -Per heart failure team -Toprol  discontinued - Continue Aldactone  Farxiga  and digoxin   Atrial flutter with RVR - Recently anticoagulation held due to concern for GI bleed - Currently on heparin  gtt. and Amio gtt. - Plan for cardioversion when volume status is improved  Severe iron deficiency anemia - Given, monitor for GI bleed on anticoagulation-on IV heparin  - Anemia panel with severe iron deficiency - Given IV iron X2 doses  Liver cirrhosis EtOH abuse - Ultrasound now with no ascites - Diuretics as noted above, EtOH cessation counseled - Thiamine , multivitamin, monitor for  withdrawal  Moderate OSA - Reports that his CPAP is yet to be delivered but last sleep study was in 2022 - Order CPAP nightly, TOC consult for home CPAP, may need a new sleep study  Morbid obesity, BMI is 55.5 - On Mounjaro now  Constipation Now on laxatives, monitor   DVT prophylaxis: hep gtt Code Status: Full Family Communication: None present Disposition Plan: Home when volume status has improved     Objective: Vitals:   05/08/24 0014 05/08/24 0326 05/08/24 0350 05/08/24 0727  BP:  (!) 74/43 (!) 97/54 (!) 99/59  Pulse: 65 92  63  Resp: 20 18 19 17   Temp:  97.8 F (36.6 C) 97.8 F (36.6 C) 97.9 F (36.6 C)  TempSrc:  Oral Oral Oral  SpO2: 100% 95% 97% 96%  Weight:  (!) 150.9 kg    Height:        Intake/Output Summary (Last 24 hours) at 05/08/2024 1016 Last data filed at 05/08/2024 9187 Gross per 24 hour  Intake 1354.41 ml  Output 6500 ml  Net -5145.59 ml   Filed Weights   05/05/24 0500 05/06/24 0454 05/08/24 0326  Weight: (!) 156.9 kg (!) 152.6 kg (!) 150.9 kg    Examination:  General exam: AAO x 3, morbidly obese, no distress HEENT: Neck obese unable to assess JVD Respiratory system: Decreased at the bases Cardiovascular system: S1 & S2 heard, irregular Abd: distended, nontender, bowel sounds present Central nervous system: Alert and oriented. No focal neurological deficits. Extremities: 1+ edema, Unna boots on Skin: No rashes Psychiatry:  Mood & affect appropriate.     Data Reviewed:   CBC: Recent Labs  Lab 05/03/24  1104 05/04/24 0256 05/06/24 0407 05/07/24 1101 05/08/24 0525  WBC 12.2* 12.4* 11.2* 9.7 11.4*  NEUTROABS 8.3*  --   --   --   --   HGB 9.8* 10.2* 9.5* 8.3* 10.3*  HCT 31.8* 33.8* 29.8* 26.5* 32.3*  MCV 80.3 81.4 78.6* 79.1* 77.5*  PLT 331 350 266 233 309   Basic Metabolic Panel: Recent Labs  Lab 05/05/24 1700 05/06/24 0407 05/06/24 1400 05/07/24 1059 05/07/24 1101 05/08/24 0525  NA 131* 131* 134* 133*  --  131*  K  2.9* 3.2* 3.2* 3.3*  --  3.7  CL 91* 91* 92* 86*  --  86*  CO2 29 26 30  32  --  34*  GLUCOSE 126* 206* 116* 253*  --  103*  BUN 34* 26* 23* 19  --  19  CREATININE 2.02* 1.81* 1.65* 1.64*  --  1.63*  CALCIUM  8.4* 8.2* 8.4* 8.3*  --  8.6*  MG  --   --   --   --  1.4* 2.3   GFR: Estimated Creatinine Clearance: 78.1 mL/min (A) (by C-G formula based on SCr of 1.63 mg/dL (H)). Liver Function Tests: Recent Labs  Lab 05/03/24 1104  AST 28  ALT 20  ALKPHOS 63  BILITOT 1.7*  PROT 7.4  ALBUMIN 3.2*   No results for input(s): LIPASE, AMYLASE in the last 168 hours. No results for input(s): AMMONIA in the last 168 hours. Coagulation Profile: No results for input(s): INR, PROTIME in the last 168 hours. Cardiac Enzymes: No results for input(s): CKTOTAL, CKMB, CKMBINDEX, TROPONINI in the last 168 hours. BNP (last 3 results) No results for input(s): PROBNP in the last 8760 hours. HbA1C: No results for input(s): HGBA1C in the last 72 hours. CBG: Recent Labs  Lab 05/07/24 0607 05/07/24 1129 05/07/24 1559 05/07/24 2123 05/08/24 0609  GLUCAP 118* 121* 141* 194* 133*   Lipid Profile: No results for input(s): CHOL, HDL, LDLCALC, TRIG, CHOLHDL, LDLDIRECT in the last 72 hours. Thyroid  Function Tests: No results for input(s): TSH, T4TOTAL, FREET4, T3FREE, THYROIDAB in the last 72 hours. Anemia Panel: No results for input(s): VITAMINB12, FOLATE, FERRITIN, TIBC, IRON, RETICCTPCT in the last 72 hours.  Urine analysis:    Component Value Date/Time   COLORURINE YELLOW 04/27/2024 1215   APPEARANCEUR CLEAR 04/27/2024 1215   LABSPEC 1.015 04/27/2024 1215   PHURINE 6.5 04/27/2024 1215   GLUCOSEU 3+ (A) 04/27/2024 1215   HGBUR TRACE (A) 04/27/2024 1215   BILIRUBINUR NEGATIVE 03/13/2007 1222   KETONESUR NEGATIVE 04/27/2024 1215   PROTEINUR 3+ (A) 04/27/2024 1215   UROBILINOGEN 0.2 03/13/2007 1222   NITRITE NEGATIVE 04/27/2024 1215    LEUKOCYTESUR NEGATIVE 04/27/2024 1215   Sepsis Labs: @LABRCNTIP (procalcitonin:4,lacticidven:4)  )No results found for this or any previous visit (from the past 240 hours).   Radiology Studies: No results found.    Scheduled Meds:  apixaban  5 mg Oral BID   Chlorhexidine  Gluconate Cloth  6 each Topical Daily   dapagliflozin  propanediol  10 mg Oral Daily   digoxin   125 mcg Oral Daily   insulin aspart  0-15 Units Subcutaneous TID WC   insulin aspart  0-5 Units Subcutaneous QHS   melatonin  5 mg Oral QHS   multivitamin with minerals  1 tablet Oral Daily   polyethylene glycol  17 g Oral BID   sacubitril -valsartan   1 tablet Oral BID   senna-docusate  1 tablet Oral Daily   sodium chloride  flush  10-40 mL Intracatheter Q12H   sodium  chloride flush  3 mL Intravenous Q12H   spironolactone   50 mg Oral Daily   thiamine   100 mg Oral Daily   Continuous Infusions:  amiodarone 30 mg/hr (05/08/24 0600)   DOBUTamine 5 mcg/kg/min (05/08/24 0532)   furosemide  (LASIX ) 200 mg in dextrose  5 % 100 mL (2 mg/mL) infusion 15 mg/hr (05/08/24 0557)   iron sucrose (VENOFER) 500 mg in sodium chloride  0.9 % 250 mL IVPB 500 mg (05/06/24 1639)   promethazine (PHENERGAN) injection (IM or IVPB)       LOS: 5 days    Time spent:    Sigurd Pac, MD [facility]   05/08/2024, 10:16 AM

## 2024-05-08 NOTE — Plan of Care (Signed)
   Problem: Coping: Goal: Ability to adjust to condition or change in health will improve Outcome: Progressing

## 2024-05-08 NOTE — Progress Notes (Signed)
 Patient ID: Kyle Pearson, male   DOB: 08-03-1975, 48 y.o.   MRN: [id number]     Advanced Heart Failure Rounding Note  Cardiologist: None  Chief Complaint: Cardiogenic Shock Subjective:    Co-ox 63% on DBA 5. CVP 13-14.  Excellent diuresis, weight down 4 lbs.  Creatinine 1.63, stable.   He remains in atrial fibrillation on amiodarone 30 mg/hr and apixaban.   Sleeping, denies dyspnea when I wake him up.   Objective:    Weight Range: (!) 150.9 kg Body mass index is 53.7 kg/m.   Vital Signs:   Temp:  [97.6 F (36.4 C)-97.9 F (36.6 C)] 97.9 F (36.6 C) (11/08 0727) Pulse Rate:  [63-92] 63 (11/08 0727) Resp:  [17-20] 17 (11/08 0727) BP: (74-123)/(43-74) 99/59 (11/08 0727) SpO2:  [95 %-100 %] 96 % (11/08 0727) Weight:  [150.9 kg] 150.9 kg (11/08 0326) Last BM Date : 05/08/24  Weight change: Filed Weights   05/05/24 0500 05/06/24 0454 05/08/24 0326  Weight: (!) 156.9 kg (!) 152.6 kg (!) 150.9 kg   Intake/Output:  Intake/Output Summary (Last 24 hours) at 05/08/2024 1402 Last data filed at 05/08/2024 9187 Gross per 24 hour  Intake 1114.41 ml  Output 5850 ml  Net -4735.59 ml    Physical Exam    General: NAD Neck: Thick JVP difficult, no thyromegaly or thyroid  nodule.  Lungs: Clear to auscultation bilaterally with normal respiratory effort. CV: Nondisplaced PMI.  Heart irregular S1/S2, no S3/S4, no murmur.  1+ ankle edema.  Abdomen: Soft, nontender, no hepatosplenomegaly, no distention.  Skin: Intact without lesions or rashes.  Neurologic: Alert and oriented x 3.  Psych: Normal affect. Extremities: No clubbing or cyanosis.  HEENT: Normal.   Telemetry   AF 70-80s (personally reviewed)  Labs    CBC Recent Labs    05/07/24 1101 05/08/24 0525  WBC 9.7 11.4*  HGB 8.3* 10.3*  HCT 26.5* 32.3*  MCV 79.1* 77.5*  PLT 233 309   Basic Metabolic Panel Recent Labs    88/92/74 1059 05/07/24 1101 05/08/24 0525  NA 133*  --  131*  K 3.3*  --  3.7  CL 86*   --  86*  CO2 32  --  34*  GLUCOSE 253*  --  103*  BUN 19  --  19  CREATININE 1.64*  --  1.63*  CALCIUM  8.3*  --  8.6*  MG  --  1.4* 2.3   Liver Function Tests No results for input(s): AST, ALT, ALKPHOS, BILITOT, PROT, ALBUMIN in the last 72 hours.  BNP (last 3 results) Recent Labs    10/08/23 1006 03/08/24 1749 05/03/24 1104  BNP 181.5* 137.8* 836.5*   Medications:     Scheduled Medications:  acetaZOLAMIDE  500 mg Intravenous Once   apixaban  5 mg Oral BID   Chlorhexidine  Gluconate Cloth  6 each Topical Daily   dapagliflozin  propanediol  10 mg Oral Daily   digoxin   125 mcg Oral Daily   insulin aspart  0-15 Units Subcutaneous TID WC   insulin aspart  0-5 Units Subcutaneous QHS   melatonin  5 mg Oral QHS   multivitamin with minerals  1 tablet Oral Daily   polyethylene glycol  17 g Oral BID   potassium chloride   40 mEq Oral Q4H   sacubitril -valsartan   1 tablet Oral BID   senna-docusate  1 tablet Oral Daily   sodium chloride  flush  10-40 mL Intracatheter Q12H   sodium chloride  flush  3 mL Intravenous Q12H  spironolactone   50 mg Oral Daily   thiamine   100 mg Oral Daily    Infusions:  amiodarone 30 mg/hr (05/08/24 0600)   DOBUTamine 5 mcg/kg/min (05/08/24 0532)   furosemide  (LASIX ) 200 mg in dextrose  5 % 100 mL (2 mg/mL) infusion 15 mg/hr (05/08/24 0557)   iron sucrose (VENOFER) 500 mg in sodium chloride  0.9 % 250 mL IVPB 500 mg (05/06/24 1639)   promethazine (PHENERGAN) injection (IM or IVPB)      PRN Medications: acetaminophen  **OR** acetaminophen , bisacodyl , ondansetron  **OR** ondansetron  (ZOFRAN ) IV, oxyCODONE , promethazine (PHENERGAN) injection (IM or IVPB), sodium chloride  flush  Assessment/Plan   Cardiogenic Shock  Acute on chronic systolic CHF: Nonischemic cardiomyopathy. [employer] ICD. Symptomatic since 9/21 (after episode of peri-tonsillar abscess).  He drinks ETOH but does not seem to describe heavy enough ETOH to cause profound  cardiomyopathy (though he did have mild cirrhosis on abdominal US  at [facility]). No FH of cardiomyopathy. Echo 11/21 with EF < 20% with severe LV dilation, moderate RVE with severely decreased systolic function, severe biatrial enlargement, mild MR. [facility] 12/21 showed no coronary disease, markedly elevated filling pressures, and low cardiac output. Cardiac MRI in 11/21 showed severely dilated LV with diffuse hypokinesis, EF 17%, mod dilated RV with EF 20%, diffuse mid-wall LGE in septum, inferior wall & inferolateral wall.  LGE pattern suggests possible prior myocarditis. Echo in 3/22 showed that EF remains < 20% with mild RV dysfunction. CPX in 4/22 showed only a mild HF limitation.  Trypanosoma cruzi antibodies were negative (had h/o neurocysticercosis), probably not cardiomyopathy related to trypanosomiasis. Echo 1/25 showed EF 20-25%, G3DD, RV mildly reduced. Repeat echo 11/4 with EF <20%, mildly reduced RV. - Admitted with NYHA IV symptoms, suspect HF exacerbation triggered by onset of atrial fibrillation. Compliance appears to be an issue. Had been off some medications. - Co-ox 63% on DBA 5.  - CVP 13-14 today, excellent diuresis yesterday on IV Lasix . Continue Lasix  gtt 15 mg/hr and will give a dose of acetazolamide today.  - continue farxiga  10 mg daily - continue spiro to 50 mg daily and replace K.  - continue digoxin  0.125 daily - continue entresto  49/51 mg bid, creatinine stable at 1.6.  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  - continue amio 30/hr (no bolus) for rate control - TEE/DCCV once euvolemic, tentatively on Monday   Anemia - PCP referred to GI due to concern for GIB - hgb stable today. Switch to eliquis - tsat 7; given IV Fe   Constipation - suspect related to increased intra-abdominal pressure - abd US  no ascites - sennakot and PRN  dulcolax/myralax   ETOH:   - He did have early cirrhosis on abdominal US  at [facility].  However, with obesity could be NAFLD. He is cutting back his beer intake - Discussed cessation.   OSA: Moderate by sleep study 08/2020. AHI 31.1/hr - Needs to wear CPAP   Obesity:  -  on Mounjaro as OP   SDOH:  - He is insured and has transportation. - Farxiga  $45/90 dayEntresto  $45/90 (can use co-pay cards for each)  Hypokalemia - Replace K.   Length of Stay: 5  Ezra Shuck, MD  05/08/2024, 2:02 PM  Advanced Heart Failure Team Pager [phone] (M-F; 7a - 5p)  Please contact [facility] for night-coverage after hours (5p -7a ) and weekends  on [email or web address]

## 2024-05-08 NOTE — Discharge Instructions (Signed)

## 2024-05-09 DIAGNOSIS — I5023 Acute on chronic systolic (congestive) heart failure: Secondary | ICD-10-CM | POA: Diagnosis not present

## 2024-05-09 LAB — MAGNESIUM: Magnesium: 2.1 mg/dL (ref 1.7–2.4)

## 2024-05-09 LAB — CBC
HCT: 35.7 % — ABNORMAL LOW (ref 39.0–52.0)
Hemoglobin: 11 g/dL — ABNORMAL LOW (ref 13.0–17.0)
MCH: 24.1 pg — ABNORMAL LOW (ref 26.0–34.0)
MCHC: 30.8 g/dL (ref 30.0–36.0)
MCV: 78.1 fL — ABNORMAL LOW (ref 80.0–100.0)
Platelets: 344 K/uL (ref 150–400)
RBC: 4.57 MIL/uL (ref 4.22–5.81)
RDW: 18.2 % — ABNORMAL HIGH (ref 11.5–15.5)
WBC: 10.6 K/uL — ABNORMAL HIGH (ref 4.0–10.5)
nRBC: 0.2 % (ref 0.0–0.2)

## 2024-05-09 LAB — BASIC METABOLIC PANEL WITH GFR
Anion gap: 13 (ref 5–15)
BUN: 22 mg/dL — ABNORMAL HIGH (ref 6–20)
CO2: 31 mmol/L (ref 22–32)
Calcium: 8.7 mg/dL — ABNORMAL LOW (ref 8.9–10.3)
Chloride: 88 mmol/L — ABNORMAL LOW (ref 98–111)
Creatinine, Ser: 1.56 mg/dL — ABNORMAL HIGH (ref 0.61–1.24)
GFR, Estimated: 55 mL/min — ABNORMAL LOW (ref >60)
Glucose, Bld: 114 mg/dL — ABNORMAL HIGH (ref 70–99)
Potassium: 4.1 mmol/L (ref 3.5–5.1)
Sodium: 132 mmol/L — ABNORMAL LOW (ref 135–145)

## 2024-05-09 LAB — GLUCOSE, CAPILLARY
Glucose-Capillary: 132 mg/dL — ABNORMAL HIGH (ref 70–99)
Glucose-Capillary: 134 mg/dL — ABNORMAL HIGH (ref 70–99)
Glucose-Capillary: 124 mg/dL — ABNORMAL HIGH (ref 70–99)
Glucose-Capillary: 151 mg/dL — ABNORMAL HIGH (ref 70–99)

## 2024-05-09 MED ORDER — METOLAZONE 5 MG PO TABS
5.0000 mg | ORAL_TABLET | Freq: Once | ORAL | Status: AC
  Administered 2024-05-09: 5 mg via ORAL
  Filled 2024-05-09: qty 1

## 2024-05-09 MED ORDER — ACETAZOLAMIDE 250 MG PO TABS
500.0000 mg | ORAL_TABLET | Freq: Two times a day (BID) | ORAL | Status: DC
  Administered 2024-05-09: 500 mg via ORAL
  Filled 2024-05-09: qty 2

## 2024-05-09 MED ORDER — TRAZODONE HCL 100 MG PO TABS
100.0000 mg | ORAL_TABLET | Freq: Every evening | ORAL | Status: DC | PRN
  Administered 2024-05-09 – 2024-05-10 (×2): 100 mg via ORAL
  Filled 2024-05-09 (×2): qty 1

## 2024-05-09 MED ORDER — SACUBITRIL-VALSARTAN 49-51 MG PO TABS
1.0000 | ORAL_TABLET | Freq: Two times a day (BID) | ORAL | Status: DC
  Administered 2024-05-10 – 2024-05-11 (×2): 1 via ORAL
  Filled 2024-05-09 (×3): qty 1

## 2024-05-09 NOTE — Plan of Care (Signed)
  Problem: Education: Goal: Ability to describe self-care measures that may prevent or decrease complications (Diabetes Survival Skills Education) will improve Outcome: Adequate for Discharge Goal: Individualized Educational Video(s) Outcome: Adequate for Discharge   Problem: Coping: Goal: Ability to adjust to condition or change in health will improve 05/09/2024 1338 by Gail Cathryne SAILOR, RN Outcome: Adequate for Discharge 05/09/2024 9178 by Gail Cathryne SAILOR, RN Outcome: Progressing   Problem: Fluid Volume: Goal: Ability to maintain a balanced intake and output will improve Outcome: Adequate for Discharge   Problem: Health Behavior/Discharge Planning: Goal: Ability to identify and utilize available resources and services will improve Outcome: Adequate for Discharge Goal: Ability to manage health-related needs will improve Outcome: Adequate for Discharge   Problem: Metabolic: Goal: Ability to maintain appropriate glucose levels will improve Outcome: Adequate for Discharge   Problem: Nutritional: Goal: Maintenance of adequate nutrition will improve Outcome: Adequate for Discharge Goal: Progress toward achieving an optimal weight will improve Outcome: Adequate for Discharge   Problem: Skin Integrity: Goal: Risk for impaired skin integrity will decrease Outcome: Adequate for Discharge   Problem: Tissue Perfusion: Goal: Adequacy of tissue perfusion will improve Outcome: Adequate for Discharge   Problem: Education: Goal: Knowledge of General Education information will improve Description: Including pain rating scale, medication(s)/side effects and non-pharmacologic comfort measures Outcome: Adequate for Discharge   Problem: Health Behavior/Discharge Planning: Goal: Ability to manage health-related needs will improve Outcome: Adequate for Discharge   Problem: Clinical Measurements: Goal: Ability to maintain clinical measurements within normal limits will improve Outcome:  Adequate for Discharge Goal: Will remain free from infection Outcome: Adequate for Discharge Goal: Diagnostic test results will improve Outcome: Adequate for Discharge Goal: Respiratory complications will improve Outcome: Adequate for Discharge Goal: Cardiovascular complication will be avoided Outcome: Adequate for Discharge   Problem: Activity: Goal: Risk for activity intolerance will decrease Outcome: Adequate for Discharge   Problem: Nutrition: Goal: Adequate nutrition will be maintained Outcome: Adequate for Discharge   Problem: Coping: Goal: Level of anxiety will decrease Outcome: Adequate for Discharge   Problem: Elimination: Goal: Will not experience complications related to bowel motility Outcome: Adequate for Discharge Goal: Will not experience complications related to urinary retention Outcome: Adequate for Discharge   Problem: Pain Managment: Goal: General experience of comfort will improve and/or be controlled Outcome: Adequate for Discharge   Problem: Safety: Goal: Ability to remain free from injury will improve Outcome: Adequate for Discharge   Problem: Skin Integrity: Goal: Risk for impaired skin integrity will decrease Outcome: Adequate for Discharge   Problem: Education: Goal: Ability to demonstrate management of disease process will improve Outcome: Adequate for Discharge Goal: Ability to verbalize understanding of medication therapies will improve Outcome: Adequate for Discharge Goal: Individualized Educational Video(s) Outcome: Adequate for Discharge   Problem: Activity: Goal: Capacity to carry out activities will improve Outcome: Adequate for Discharge   Problem: Cardiac: Goal: Ability to achieve and maintain adequate cardiopulmonary perfusion will improve Outcome: Adequate for Discharge

## 2024-05-09 NOTE — Plan of Care (Signed)
  Problem: Tissue Perfusion: Goal: Adequacy of tissue perfusion will improve Outcome: Progressing   Problem: Clinical Measurements: Goal: Will remain free from infection Outcome: Progressing Goal: Cardiovascular complication will be avoided Outcome: Progressing   Problem: Activity: Goal: Risk for activity intolerance will decrease Outcome: Progressing   Problem: Safety: Goal: Ability to remain free from injury will improve Outcome: Progressing

## 2024-05-09 NOTE — Progress Notes (Signed)
 Patient ID: Kyle Pearson, male   DOB: [birth year], 48 y.o.   MRN: [id number]     Advanced Heart Failure Rounding Note  Cardiologist: None  Chief Complaint: Cardiogenic Shock Subjective:    Still with ongoing orthopnea. Good urine output but weight not as improved as I would expect. Discussed fluid intake. Asking for something for sleep.   Objective:    Weight Range: (!) 149.8 kg Body mass index is 53.3 kg/m.   Vital Signs:   Temp:  [97.4 F (36.3 C)-98.3 F (36.8 C)] 97.8 F (36.6 C) (11/09 1135) Pulse Rate:  [62-93] 62 (11/09 1516) Resp:  [15-20] 15 (11/09 1516) BP: (67-143)/(47-106) 84/47 (11/09 1516) SpO2:  [93 %-98 %] 98 % (11/09 1516) Weight:  [149.8 kg] 149.8 kg (11/09 0346) Last BM Date : 05/08/24  Weight change: Filed Weights   05/06/24 0454 05/08/24 0326 05/09/24 0346  Weight: (!) 152.6 kg (!) 150.9 kg (!) 149.8 kg   Intake/Output:  Intake/Output Summary (Last 24 hours) at 05/09/2024 1618 Last data filed at 05/09/2024 1541 Gross per 24 hour  Intake 3016.91 ml  Output 5080 ml  Net -2063.09 ml    Physical Exam    GENERAL: NAD, fair appearing PULM:  Normal work of breathing, CTAB CARDIAC:  JVP: moderately elevcated         Normal rate with irregular rhythm. No murmurs, rubs or gallops.  1+ edema. Warm and well perfused extremities. ABDOMEN: Soft, non-tender, non-distended. NEUROLOGIC: Patient is oriented x3 with no focal or lateralizing neurologic deficits.    Telemetry   AF 70-80s (personally reviewed)  Labs    CBC Recent Labs    05/08/24 0525 05/09/24 0356  WBC 11.4* 10.6*  HGB 10.3* 11.0*  HCT 32.3* 35.7*  MCV 77.5* 78.1*  PLT 309 344   Basic Metabolic Panel Recent Labs    88/91/74 0525 05/09/24 0356  NA 131* 132*  K 3.7 4.1  CL 86* 88*  CO2 34* 31  GLUCOSE 103* 114*  BUN 19 22*  CREATININE 1.63* 1.56*  CALCIUM  8.6* 8.7*  MG 2.3 2.1   Liver Function Tests No results for input(s): AST, ALT, ALKPHOS, BILITOT,  PROT, ALBUMIN in the last 72 hours.  BNP (last 3 results) Recent Labs    10/08/23 1006 03/08/24 1749 05/03/24 1104  BNP 181.5* 137.8* 836.5*   Medications:     Scheduled Medications:  acetaZOLAMIDE  500 mg Oral BID   apixaban  5 mg Oral BID   Chlorhexidine  Gluconate Cloth  6 each Topical Daily   dapagliflozin  propanediol  10 mg Oral Daily   digoxin   125 mcg Oral Daily   insulin aspart  0-15 Units Subcutaneous TID WC   insulin aspart  0-5 Units Subcutaneous QHS   melatonin  5 mg Oral QHS   multivitamin with minerals  1 tablet Oral Daily   polyethylene glycol  17 g Oral BID   sacubitril -valsartan   1 tablet Oral BID   senna-docusate  1 tablet Oral Daily   sodium chloride  flush  10-40 mL Intracatheter Q12H   sodium chloride  flush  3 mL Intravenous Q12H   spironolactone   50 mg Oral Daily   thiamine   100 mg Oral Daily    Infusions:  amiodarone 30 mg/hr (05/09/24 1541)   DOBUTamine 5 mcg/kg/min (05/09/24 1541)   furosemide  (LASIX ) 200 mg in dextrose  5 % 100 mL (2 mg/mL) infusion 15 mg/hr (05/09/24 1541)   promethazine (PHENERGAN) injection (IM or IVPB)      PRN Medications:  acetaminophen  **OR** acetaminophen , bisacodyl , ondansetron  **OR** ondansetron  (ZOFRAN ) IV, oxyCODONE , promethazine (PHENERGAN) injection (IM or IVPB), sodium chloride  flush  Assessment/Plan   Cardiogenic Shock  Acute on chronic systolic CHF: Nonischemic cardiomyopathy. [employer] ICD. Symptomatic since 9/21 (after episode of peri-tonsillar abscess).  He drinks ETOH but does not seem to describe heavy enough ETOH to cause profound cardiomyopathy (though he did have mild cirrhosis on abdominal US  at [facility]). No FH of cardiomyopathy. Echo 11/21 with EF < 20% with severe LV dilation, moderate RVE with severely decreased systolic function, severe biatrial enlargement, mild MR. [facility] 12/21 showed no coronary disease, markedly elevated filling pressures, and low cardiac output. Cardiac MRI in 11/21 showed  severely dilated LV with diffuse hypokinesis, EF 17%, mod dilated RV with EF 20%, diffuse mid-wall LGE in septum, inferior wall & inferolateral wall.  LGE pattern suggests possible prior myocarditis. Echo in 3/22 showed that EF remains < 20% with mild RV dysfunction. CPX in 4/22 showed only a mild HF limitation.  Trypanosoma cruzi antibodies were negative (had h/o neurocysticercosis), probably not cardiomyopathy related to trypanosomiasis. Echo 1/25 showed EF 20-25%, G3DD, RV mildly reduced. Repeat echo 11/4 with EF <20%, mildly reduced RV. - Admitted with NYHA IV symptoms, suspect HF exacerbation triggered by onset of atrial fibrillation. Compliance appears to be an issue. Had been off some medications. - Continue DBA 5 - CVP around 10-12 today, excellent diuresis yesterday on IV Lasix . Continue Lasix  gtt 15 mg/hr, give diamox x2 and metolazone  - continue farxiga  10 mg daily - continue spiro 50 mg daily and replace K.  - continue digoxin  0.125 daily - continue entresto  49/51 mg bid, creatinine stable at 1.5.  - Off toprol  with low output - TEE/DCCV once euvolemic, tentatively on Monday   Paroxysmal AFL - new as of ICM remote monitoring 04/17/24; see on ECG by PCP - Continue Eliquis.  - continue amio 30/hr (no bolus) for rate control - TEE/DCCV once euvolemic, tentatively on Monday   Anemia - PCP referred to GI due to concern for GIB - hgb stable today. Switch to eliquis - tsat 7; given IV Fe   Constipation - suspect related to increased intra-abdominal pressure - abd US  no ascites - sennakot and PRN dulcolax/myralax   ETOH:   - He did have early cirrhosis on abdominal US  at [facility].  However, with obesity could be NAFLD. He is cutting back his beer intake - Discussed cessation.   OSA: Moderate by sleep study 08/2020. AHI 31.1/hr - Needs to wear CPAP   Obesity:  -  on Mounjaro as OP   SDOH:  - He is insured and has transportation. - Farxiga  $45/90 dayEntresto  $45/90 (can use  co-pay cards for each)  Hypokalemia - Replace K.   Length of Stay: 6  Morene JINNY Brownie, MD  05/09/2024, 4:18 PM  Advanced Heart Failure Team Pager [phone] (M-F; 7a - 5p)  Please contact [facility] for night-coverage after hours (5p -7a ) and weekends on [email or web address]

## 2024-05-09 NOTE — Progress Notes (Signed)
 PROGRESS NOTE    Kyle Pearson  FMW:[id number] DOB: 1976-02-07 DOA: 05/03/2024 PCP: Kayla Jeoffrey RAMAN, FNP  47/M, morbidly obese with chronic systolic CHF/NICM, paroxysmal A-fib, EtOH abuse, liver cirrhosis, OSA, presented to GI office 11/3 to discuss colonoscopy, he was noted to be significantly volume overloaded and  in atrial flutter with RVR, sent to the ED, questionable compliance, and still does not have his CPAP. - In the ED A-fib RVR, blood pressure in the 110s, BNP 836, troponin 91, creatinine 1.5, sodium 132, WBC 12.2 - 11/4 heart failure team consulting, started on Lasix  gtt. - 11/5, concern for cardiogenic shock, low output, starting dobutamine -11/6: Brisk diuresis  - 11/9 reports numbness all over his body.  Assessment and Plan: Acute on chronic systolic CHF, NICM Cardiogenic shock, low output - Last echo 1/25 noted EF 20-25, grade 3 DD, mildly reduced RV, [facility] 12/21 noted no CAD - Significantly volume overloaded on admission this time, concern for compliance with medications - Heart failure team following, - Started dobutamine 11/5, diuresing well on Lasix  gtt., - Per heart failure team -Toprol  discontinued - Continue Aldactone  Farxiga  and digoxin   Atrial flutter with RVR - Recently anticoagulation held due to concern for GI bleed - Currently on heparin  gtt. and Amio gtt. - Plan for cardioversion when volume status is improved  Severe iron deficiency anemia - Given, monitor for GI bleed on anticoagulation-on IV heparin  - Anemia panel with severe iron deficiency - Given IV iron X2 doses  Liver cirrhosis EtOH abuse - Ultrasound now with no ascites - Diuretics as noted above, EtOH cessation counseled - Thiamine , multivitamin, monitor for withdrawal  Moderate OSA - Reports that his CPAP is yet to be delivered but last sleep study was in 2022 - Order CPAP nightly, TOC consult for home CPAP, may need a new sleep study  Morbid obesity, BMI is 55.5 - On Mounjaro  now  Constipation Now on laxatives, monitor  Diffuse nerve pain. Patient reports that he has diffuse numbness and tingling all over his body. Also has some pain in his legs and arm as well as around his jaw which feels like nerve pain. Etiology not clear. Vitamin levels have been adequate. No focal deficit. For now we will monitor. Unsure if it is rare but potential amiodarone toxicity.  Will monitor.  DVT prophylaxis: On Eliquis. Code Status: Full Family Communication: None present Disposition Plan: Home when volume status has improved  Objective: Vitals:   05/09/24 0750 05/09/24 1135 05/09/24 1515 05/09/24 1516  BP: (!) 113/98 (!) 143/106 (!) 67/47 (!) 84/47  Pulse: 93 65 63 62  Resp: 19 18 17 15   Temp: 98 F (36.7 C) 97.8 F (36.6 C)    TempSrc: Oral Oral    SpO2: 97% 97% 97% 98%  Weight:      Height:        Intake/Output Summary (Last 24 hours) at 05/09/2024 1658 Last data filed at 05/09/2024 1541 Gross per 24 hour  Intake 3016.91 ml  Output 5080 ml  Net -2063.09 ml   Filed Weights   05/06/24 0454 05/08/24 0326 05/09/24 0346  Weight: (!) 152.6 kg (!) 150.9 kg (!) 149.8 kg    Examination:  General: in Mild distress, No Rash Cardiovascular: S1 and S2 Present, No Murmur Respiratory: Good respiratory effort, Bilateral Air entry present.  Basal crackles, No wheezes Abdomen: Bowel Sound present, No tenderness Extremities: Bilateral lower leg edema Neuro: Alert and oriented x3, no new focal deficit normal finger-nose-finger, extraocular muscles are  normal, pupils are equal and reactive to light.  Data Reviewed:  Reviewed CBC and BMP.  Time spent:  Electronically signed: Yetta Blanch, MD [facility] 05/09/2024 5:09 PM

## 2024-05-09 NOTE — Plan of Care (Signed)
   Problem: Coping: Goal: Ability to adjust to condition or change in health will improve Outcome: Progressing

## 2024-05-10 ENCOUNTER — Encounter (HOSPITAL_COMMUNITY): Payer: Self-pay | Admitting: Cardiology

## 2024-05-10 ENCOUNTER — Inpatient Hospital Stay (HOSPITAL_COMMUNITY)

## 2024-05-10 ENCOUNTER — Inpatient Hospital Stay (HOSPITAL_COMMUNITY): Admitting: Anesthesiology

## 2024-05-10 ENCOUNTER — Encounter (HOSPITAL_COMMUNITY): Admission: EM | Disposition: A | Payer: Self-pay | Source: Ambulatory Visit | Attending: Internal Medicine

## 2024-05-10 DIAGNOSIS — I340 Nonrheumatic mitral (valve) insufficiency: Secondary | ICD-10-CM | POA: Diagnosis not present

## 2024-05-10 DIAGNOSIS — I4892 Unspecified atrial flutter: Secondary | ICD-10-CM | POA: Diagnosis not present

## 2024-05-10 DIAGNOSIS — I5023 Acute on chronic systolic (congestive) heart failure: Secondary | ICD-10-CM

## 2024-05-10 DIAGNOSIS — Z87891 Personal history of nicotine dependence: Secondary | ICD-10-CM

## 2024-05-10 DIAGNOSIS — R570 Cardiogenic shock: Secondary | ICD-10-CM | POA: Diagnosis not present

## 2024-05-10 HISTORY — PX: CARDIOVERSION: EP1203

## 2024-05-10 HISTORY — PX: TRANSESOPHAGEAL ECHOCARDIOGRAM (CATH LAB): EP1270

## 2024-05-10 LAB — BLOOD GAS, VENOUS
Acid-Base Excess: 14.8 mmol/L — ABNORMAL HIGH (ref 0.0–2.0)
Bicarbonate: 41.3 mmol/L — ABNORMAL HIGH (ref 20.0–28.0)
O2 Saturation: 56.2 %
Patient temperature: 35
pCO2, Ven: 53 mmHg (ref 44–60)
pH, Ven: 7.49 — ABNORMAL HIGH (ref 7.25–7.43)
pO2, Ven: <31 mmHg — CL (ref 32–45)

## 2024-05-10 LAB — GLUCOSE, CAPILLARY
Glucose-Capillary: 112 mg/dL — ABNORMAL HIGH (ref 70–99)
Glucose-Capillary: 136 mg/dL — ABNORMAL HIGH (ref 70–99)
Glucose-Capillary: 196 mg/dL — ABNORMAL HIGH (ref 70–99)
Glucose-Capillary: 100 mg/dL — ABNORMAL HIGH (ref 70–99)

## 2024-05-10 LAB — CBC WITH DIFFERENTIAL/PLATELET
Abs Immature Granulocytes: 0.1 K/uL — ABNORMAL HIGH (ref 0.00–0.07)
Basophils Absolute: 0.1 K/uL (ref 0.0–0.1)
Basophils Relative: 1 %
Eosinophils Absolute: 0.5 K/uL (ref 0.0–0.5)
Eosinophils Relative: 5 %
HCT: 35.9 % — ABNORMAL LOW (ref 39.0–52.0)
Hemoglobin: 11.5 g/dL — ABNORMAL LOW (ref 13.0–17.0)
Immature Granulocytes: 1 %
Lymphocytes Relative: 27 %
Lymphs Abs: 2.9 K/uL (ref 0.7–4.0)
MCH: 24.6 pg — ABNORMAL LOW (ref 26.0–34.0)
MCHC: 32 g/dL (ref 30.0–36.0)
MCV: 76.9 fL — ABNORMAL LOW (ref 80.0–100.0)
Monocytes Absolute: 0.9 K/uL (ref 0.1–1.0)
Monocytes Relative: 8 %
Neutro Abs: 6.2 K/uL (ref 1.7–7.7)
Neutrophils Relative %: 58 %
Platelets: 341 K/uL (ref 150–400)
RBC: 4.67 MIL/uL (ref 4.22–5.81)
RDW: 18.8 % — ABNORMAL HIGH (ref 11.5–15.5)
WBC: 10.6 K/uL — ABNORMAL HIGH (ref 4.0–10.5)
nRBC: 0 % (ref 0.0–0.2)

## 2024-05-10 LAB — COOXEMETRY PANEL
Carboxyhemoglobin: 2.4 % — ABNORMAL HIGH (ref 0.5–1.5)
Methemoglobin: 0.7 % (ref 0.0–1.5)
O2 Saturation: 61.4 %
Total hemoglobin: 12.1 g/dL (ref 12.0–16.0)

## 2024-05-10 LAB — MAGNESIUM: Magnesium: 1.9 mg/dL (ref 1.7–2.4)

## 2024-05-10 LAB — COMPREHENSIVE METABOLIC PANEL WITH GFR
ALT: 48 U/L — ABNORMAL HIGH (ref 0–44)
AST: 65 U/L — ABNORMAL HIGH (ref 15–41)
Albumin: 3.1 g/dL — ABNORMAL LOW (ref 3.5–5.0)
Alkaline Phosphatase: 67 U/L (ref 38–126)
Anion gap: 15 (ref 5–15)
BUN: 26 mg/dL — ABNORMAL HIGH (ref 6–20)
CO2: 30 mmol/L (ref 22–32)
Calcium: 8.9 mg/dL (ref 8.9–10.3)
Chloride: 84 mmol/L — ABNORMAL LOW (ref 98–111)
Creatinine, Ser: 1.74 mg/dL — ABNORMAL HIGH (ref 0.61–1.24)
GFR, Estimated: 48 mL/min — ABNORMAL LOW (ref >60)
Glucose, Bld: 180 mg/dL — ABNORMAL HIGH (ref 70–99)
Potassium: 3.8 mmol/L (ref 3.5–5.1)
Sodium: 129 mmol/L — ABNORMAL LOW (ref 135–145)
Total Bilirubin: 1.1 mg/dL (ref 0.0–1.2)
Total Protein: 7.3 g/dL (ref 6.5–8.1)

## 2024-05-10 LAB — BLOOD GAS, ARTERIAL
Acid-Base Excess: 11.9 mmol/L — ABNORMAL HIGH (ref 0.0–2.0)
Bicarbonate: 36.7 mmol/L — ABNORMAL HIGH (ref 20.0–28.0)
Drawn by: 137461
O2 Saturation: 98.9 %
Patient temperature: 36.5
pCO2 arterial: 46 mmHg (ref 32–48)
pH, Arterial: 7.51 — ABNORMAL HIGH (ref 7.35–7.45)
pO2, Arterial: 99 mmHg (ref 83–108)

## 2024-05-10 SURGERY — TRANSESOPHAGEAL ECHOCARDIOGRAM (TEE) (CATHLAB)
Anesthesia: Monitor Anesthesia Care

## 2024-05-10 MED ORDER — SODIUM CHLORIDE 0.9 % IV SOLN: INTRAVENOUS | Status: DC

## 2024-05-10 MED ORDER — MAGNESIUM SULFATE 2 GM/50ML IV SOLN
2.0000 g | Freq: Once | INTRAVENOUS | Status: AC
  Administered 2024-05-10: 2 g via INTRAVENOUS
  Filled 2024-05-10: qty 50

## 2024-05-10 MED ORDER — PROPOFOL 500 MG/50ML IV EMUL
INTRAVENOUS | Status: DC | PRN
  Administered 2024-05-10: 100 ug/kg/min via INTRAVENOUS

## 2024-05-10 MED ORDER — DOBUTAMINE-DEXTROSE 4-5 MG/ML-% IV SOLN
2.5000 ug/kg/min | INTRAVENOUS | Status: DC
  Administered 2024-05-10: 2.5 ug/kg/min via INTRAVENOUS
  Filled 2024-05-10: qty 250

## 2024-05-10 MED ORDER — LACTULOSE 10 GM/15ML PO SOLN
20.0000 g | Freq: Every day | ORAL | Status: DC
  Administered 2024-05-10 – 2024-05-11 (×2): 20 g via ORAL
  Filled 2024-05-10 (×2): qty 30

## 2024-05-10 MED ORDER — ETOMIDATE 2 MG/ML IV SOLN
INTRAVENOUS | Status: DC | PRN
  Administered 2024-05-10: 2 mg via INTRAVENOUS
  Administered 2024-05-10: 8 mg via INTRAVENOUS

## 2024-05-10 MED ORDER — PHENYLEPHRINE HCL-NACL 20-0.9 MG/250ML-% IV SOLN
INTRAVENOUS | Status: DC | PRN
  Administered 2024-05-10: 50 ug/min via INTRAVENOUS

## 2024-05-10 MED ORDER — POTASSIUM CHLORIDE CRYS ER 20 MEQ PO TBCR
40.0000 meq | EXTENDED_RELEASE_TABLET | Freq: Two times a day (BID) | ORAL | Status: AC
  Administered 2024-05-10 (×2): 40 meq via ORAL
  Filled 2024-05-10 (×2): qty 2

## 2024-05-10 SURGICAL SUPPLY — 1 items: PAD DEFIB RADIO PHYSIO CONN (PAD) ×1 IMPLANT

## 2024-05-10 NOTE — Anesthesia Postprocedure Evaluation (Signed)
 Anesthesia Post Note  Patient: Kyle Pearson  Procedure(s) Performed: TRANSESOPHAGEAL ECHOCARDIOGRAM CARDIOVERSION     Patient location during evaluation: PACU Anesthesia Type: MAC Level of consciousness: awake Pain management: pain level controlled Vital Signs Assessment: post-procedure vital signs reviewed and stable Respiratory status: spontaneous breathing, nonlabored ventilation and respiratory function stable Cardiovascular status: stable and blood pressure returned to baseline Postop Assessment: no apparent nausea or vomiting Anesthetic complications: no   No notable events documented.  Last Vitals:  Vitals:   05/10/24 1235 05/10/24 1240  BP: (!) 88/67 98/64  Pulse: 77 96  Resp: 14 16  Temp:    SpO2: (!) 87% 97%    Last Pain:  Vitals:   05/10/24 1225  TempSrc: Temporal  PainSc: Asleep                 Delon Aisha Arch

## 2024-05-10 NOTE — Progress Notes (Signed)
 Date and time results received: 05/10/24 1630 (use smartphrase .now to insert current time)  Test: Venous blood gas PO2 less than 31 Critical Value: PO2 less than 31  Name of Provider Notified:Patel, Yetta  Orders Received? Or Actions Taken? Awaiting any orders

## 2024-05-10 NOTE — TOC Progression Note (Signed)
 Transition of Care [facility]) - Progression Note    Patient Details  Name: Kyle Pearson MRN: [id number] Date of Birth: [birth year]  [facility] CM/SW Contact  [name] Phone Number: [phone] 05/10/2024, 1:43 PM  Clinical Narrative:   Per chart review patient and progressions patient is not quite medically stable for dc.   HF CSW/CM will continue to follow and monitor for dc readiness.     Expected Discharge Plan: Home/Self Care Barriers to Discharge: Continued Medical Work up               Expected Discharge Plan and Services   Discharge Planning Services: CM Consult   Living arrangements for the past 2 months: Single Family Home                                       Social Drivers of Health (SDOH) Interventions SDOH Screenings   Food Insecurity: No Food Insecurity (05/04/2024)  Housing: Low Risk  (05/04/2024)  Transportation Needs: No Transportation Needs (05/04/2024)  Utilities: Not At Risk (05/04/2024)  Tobacco Use: Medium Risk (05/03/2024)    Readmission Risk Interventions     No data to display

## 2024-05-10 NOTE — Progress Notes (Signed)
  Echocardiogram Echocardiogram Transesophageal has been performed.  Kyle Pearson 05/10/2024, 12:35 PM

## 2024-05-10 NOTE — CV Procedure (Signed)
 Procedure: TEE  Indication: Atrial flutter  Sedation: Per anesthesiology  Findings: Please see echo section for full report. Severely dilated LV with EF < 20%.  Normal wall thickness.  Diffuse hypokinesis.  Mildly dilated RV with mild-moderate systolic dysfunction.  Moderate left atrial enlargement, no LA appendage thrombus.  Moderate right atrial enlargement.  No ASD/PFO by color doppler.  Trivial TR.  Trileaflet aortic valve with no stenosis or regurgitation.  Moderate-severe mitral regurgitation, suspect functional with dilated LV and LA, PISA ERO 0.37 cm^2.  Normal caliber thoracic aorta with minimal plaque.   May proceed to DCCV.   Kyle Pearson 05/10/2024 12:15 PM

## 2024-05-10 NOTE — Plan of Care (Signed)
  Problem: Coping: Goal: Ability to adjust to condition or change in health will improve Outcome: Progressing   Problem: Skin Integrity: Goal: Risk for impaired skin integrity will decrease Outcome: Progressing   Problem: Tissue Perfusion: Goal: Adequacy of tissue perfusion will improve Outcome: Progressing   Problem: Clinical Measurements: Goal: Will remain free from infection Outcome: Progressing   Problem: Elimination: Goal: Will not experience complications related to bowel motility Outcome: Progressing Goal: Will not experience complications related to urinary retention Outcome: Progressing   Problem: Safety: Goal: Ability to remain free from injury will improve Outcome: Progressing

## 2024-05-10 NOTE — Progress Notes (Signed)
 Orthopedic Tech Progress Note Patient Details:  Kyle Pearson May 07, 1976 [id number]  Ortho Devices Type of Ortho Device: Ace wrap, Unna boot Ortho Device/Splint Location: BLE Ortho Device/Splint Interventions: Ordered, Application   Post Interventions Patient Tolerated: Well Instructions Provided: Care of device  [name] Pac 05/10/2024, 4:58 PM

## 2024-05-10 NOTE — Interval H&P Note (Signed)
 History and Physical Interval Note:  05/10/2024 11:52 AM  Kyle Pearson  has presented today for surgery, with the diagnosis of aflutter.  The various methods of treatment have been discussed with the patient and family. After consideration of risks, benefits and other options for treatment, the patient has consented to  Procedure(s): TRANSESOPHAGEAL ECHOCARDIOGRAM (N/A) CARDIOVERSION (N/A) as a surgical intervention.  The patient's history has been reviewed, patient examined, no change in status, stable for surgery.  I have reviewed the patient's chart and labs.  Questions were answered to the patient's satisfaction.     Siboney Requejo [employer]

## 2024-05-10 NOTE — Progress Notes (Signed)
 Patient ID: Kyle Pearson, male   DOB: [birth year], 48 y.o.   MRN: [id number]     Advanced Heart Failure Rounding Note  Cardiologist: None  Chief Complaint: Cardiogenic Shock Subjective:    He is on DBA 5, no co-ox yet this morning. CVP around 9.  Excellent diuresis, weight down 6 lbs.  Pending creatinine.   He remains in atrial fibrillation on amiodarone 30 mg/hr and apixaban.   No dyspnea, having hard time using CPAP.   Objective:    Weight Range: (!) 147.2 kg Body mass index is 52.38 kg/m.   Vital Signs:   Temp:  [97.8 F (36.6 C)-98.7 F (37.1 C)] 97.8 F (36.6 C) (11/10 0456) Pulse Rate:  [62-93] 85 (11/10 0456) Resp:  [15-37] 20 (11/10 0456) BP: (67-143)/(47-106) 98/72 (11/10 0456) SpO2:  [95 %-98 %] 95 % (11/10 0456) Weight:  [147.2 kg] 147.2 kg (11/10 0500) Last BM Date : 05/08/24  Weight change: Filed Weights   05/09/24 0346 05/10/24 0456 05/10/24 0500  Weight: (!) 149.8 kg (!) 147.2 kg (!) 147.2 kg   Intake/Output:  Intake/Output Summary (Last 24 hours) at 05/10/2024 0747 Last data filed at 05/10/2024 0500 Gross per 24 hour  Intake 1235.87 ml  Output 5630 ml  Net -4394.13 ml    Physical Exam    General: NAD Neck: Thick, JVP difficult, no thyromegaly or thyroid  nodule.  Lungs: Clear to auscultation bilaterally with normal respiratory effort. CV: Nondisplaced PMI.  Heart irregular S1/S2, no S3/S4, no murmur.  Trace ankle edema.  Abdomen: Soft, nontender, no hepatosplenomegaly, no distention.  Skin: Intact without lesions or rashes.  Neurologic: Alert and oriented x 3.  Psych: Normal affect. Extremities: No clubbing or cyanosis.  HEENT: Normal.    Telemetry   AF 70-80s (personally reviewed)  Labs    CBC Recent Labs    05/09/24 0356 05/10/24 0500  WBC 10.6* 10.6*  NEUTROABS  --  6.2  HGB 11.0* 11.5*  HCT 35.7* 35.9*  MCV 78.1* 76.9*  PLT 344 341   Basic Metabolic Panel Recent Labs    88/91/74 0525 05/09/24 0356  NA 131*  132*  K 3.7 4.1  CL 86* 88*  CO2 34* 31  GLUCOSE 103* 114*  BUN 19 22*  CREATININE 1.63* 1.56*  CALCIUM  8.6* 8.7*  MG 2.3 2.1   Liver Function Tests No results for input(s): AST, ALT, ALKPHOS, BILITOT, PROT, ALBUMIN in the last 72 hours.  BNP (last 3 results) Recent Labs    10/08/23 1006 03/08/24 1749 05/03/24 1104  BNP 181.5* 137.8* 836.5*   Medications:     Scheduled Medications:  apixaban  5 mg Oral BID   Chlorhexidine  Gluconate Cloth  6 each Topical Daily   dapagliflozin  propanediol  10 mg Oral Daily   digoxin   125 mcg Oral Daily   insulin aspart  0-15 Units Subcutaneous TID WC   insulin aspart  0-5 Units Subcutaneous QHS   melatonin  5 mg Oral QHS   multivitamin with minerals  1 tablet Oral Daily   polyethylene glycol  17 g Oral BID   sacubitril -valsartan   1 tablet Oral BID   senna-docusate  1 tablet Oral Daily   sodium chloride  flush  10-40 mL Intracatheter Q12H   sodium chloride  flush  3 mL Intravenous Q12H   spironolactone   50 mg Oral Daily   thiamine   100 mg Oral Daily    Infusions:  amiodarone 30 mg/hr (05/10/24 0517)   DOBUTamine     furosemide  (LASIX ) 200  mg in dextrose  5 % 100 mL (2 mg/mL) infusion 15 mg/hr (05/10/24 0518)   promethazine (PHENERGAN) injection (IM or IVPB)      PRN Medications: acetaminophen  **OR** acetaminophen , bisacodyl , ondansetron  **OR** ondansetron  (ZOFRAN ) IV, oxyCODONE , promethazine (PHENERGAN) injection (IM or IVPB), sodium chloride  flush, traZODone  Assessment/Plan   Cardiogenic Shock  Acute on chronic systolic CHF: Nonischemic cardiomyopathy. [employer] ICD. Symptomatic since 9/21 (after episode of peri-tonsillar abscess).  He drinks ETOH but does not seem to describe heavy enough ETOH to cause profound cardiomyopathy (though he did have mild cirrhosis on abdominal US  at [facility]). No FH of cardiomyopathy. Echo 11/21 with EF < 20% with severe LV dilation, moderate RVE with severely decreased systolic  function, severe biatrial enlargement, mild MR. [facility] 12/21 showed no coronary disease, markedly elevated filling pressures, and low cardiac output. Cardiac MRI in 11/21 showed severely dilated LV with diffuse hypokinesis, EF 17%, mod dilated RV with EF 20%, diffuse mid-wall LGE in septum, inferior wall & inferolateral wall.  LGE pattern suggests possible prior myocarditis. Echo in 3/22 showed that EF remains < 20% with mild RV dysfunction. CPX in 4/22 showed only a mild HF limitation.  Trypanosoma cruzi antibodies were negative (had h/o neurocysticercosis), probably not cardiomyopathy related to trypanosomiasis. Echo 1/25 showed EF 20-25%, G3DD, RV mildly reduced. Repeat echo 11/4 with EF <20%, mildly reduced RV. - Admitted with NYHA IV symptoms, suspect HF exacerbation triggered by onset of atrial fibrillation. Compliance appears to be an issue. Had been off some medications. - Decrease dobutamine to 2.5 mcg/kg/min today, needs co-ox sent.   - CVP around 9 today, excellent diuresis yesterday on Lasix  + metolazone  and acetazolamide.  I will continue Lasix  gtt 15 mg/hr for 1 more day but will hold off on metolazone /acetazolamide.  Hopefully to po tomorrow.   - continue farxiga  10 mg daily - continue spironolactone  to 50 mg daily and replace K.  - continue digoxin  0.125 daily - continue entresto  49/51 mg bid for now. SBP 90s at times, may need to cut back but will watch for now.   - stopped toprol  with low output - TEE/DCCV today. Discussed risks/benefits, he agrees with procedure.    Paroxysmal AFL - new as of ICM remote monitoring 04/17/24; see on ECG by PCP - Continue Eliquis.  - continue amio 30/hr (no bolus) for rate control - TEE/DCCV today, discussed risks/benefits and he agrees to procedure.    Anemia - PCP referred to GI due to concern for GIB - hgb stable today. Switch to eliquis - tsat 7; given IV Fe   Constipation - suspect related to increased intra-abdominal pressure - abd US  no  ascites - sennakot and PRN dulcolax/myralax   ETOH:   - He did have early cirrhosis on abdominal US  at [facility].  However, with obesity could be NAFLD. He is cutting back his beer intake - Discussed cessation.   OSA: Moderate by sleep study 08/2020. AHI 31.1/hr - Needs to wear CPAP   Obesity:  -  on Mounjaro as OP   SDOH:  - He is insured and has transportation. - Farxiga  $45/90 dayEntresto  $45/90 (can use co-pay cards for each)  Hypokalemia - Replace K.   Length of Stay: 7  Ezra Shuck, MD  05/10/2024, 7:47 AM  Advanced Heart Failure Team Pager [phone] (M-F; 7a - 5p)  Please contact [facility] for night-coverage after hours (5p -7a ) and weekends on [email or web address]

## 2024-05-10 NOTE — Progress Notes (Signed)
 [facility] Progress Note Patient: Kyle Pearson FMW:[id number] DOB: 05-Apr-1976  DOA: 05/03/2024 DOS: the patient was seen and examined on 05/10/2024  Brief Hospital Course: 47/M, morbidly obese with chronic systolic CHF/NICM, paroxysmal A-fib, EtOH abuse, liver cirrhosis, OSA, presented to GI office 11/3 to discuss colonoscopy, he was noted to be significantly volume overloaded and  in atrial flutter with RVR, sent to the ED, questionable compliance, and still does not have his CPAP. - In the ED A-fib RVR, blood pressure in the 110s, BNP 836, troponin 91, creatinine 1.5, sodium 132, WBC 12.2 - 11/4 heart failure team consulting, started on Lasix  gtt. - 11/5, concern for cardiogenic shock, low output, starting dobutamine -11/6: Brisk diuresis  - 11/9 reports numbness all over his body.   Assessment and Plan: Acute on chronic systolic CHF, NICM Cardiogenic shock, low output - Last echo 1/25 noted EF 20-25, grade 3 DD, mildly reduced RV, LHC 12/21 noted no CAD - Significantly volume overloaded on admission this time, concern for compliance with medications - Heart failure team following, - Started dobutamine 11/5, diuresing well on Lasix  gtt., - Per heart failure team -Toprol  discontinued - Continue Aldactone  Farxiga  and digoxin    Atrial flutter with RVR - Recently anticoagulation held due to concern for GI bleed - Currently on heparin  gtt. and Amio gtt. Underwent cardioversion on 11/10.  Monitor.   Severe iron deficiency anemia - Given, monitor for GI bleed on anticoagulation-on IV heparin  - Anemia panel with severe iron deficiency - Given IV iron X2 doses   Liver cirrhosis EtOH abuse - Ultrasound now with no ascites - Diuretics as noted above, EtOH cessation counseled - Thiamine , multivitamin, monitor for withdrawal   Moderate OSA - Reports that his CPAP is yet to be delivered but last sleep study was in 2022 - Order CPAP nightly, TOC consult for home CPAP, may  need a new sleep study   Morbid obesity, BMI is 55.5 - On Mounjaro now   Constipation Now on laxatives, monitor   Diffuse nerve pain. Patient reports that he has diffuse numbness and tingling all over his body. Also has some pain in his legs and arm as well as around his jaw which feels like nerve pain. Etiology not clear. Vitamin levels have been adequate. No focal deficit. For now we will monitor. Unsure if it is rare but potential amiodarone toxicity.  Will monitor.  Myoclonic jerks. Patient reports that he has myoclonic jerks.  Will initiate lactulose given history of cirrhosis. Check ammonia level tomorrow. No focal deficit. ABG and VBG reassuring.   Subjective: No nausea or vomiting.  Still has some swelling in the leg.  Seen after the procedure.  He reports that he has some bags cramps.  He also reports jerking movement of his hands.  He reports that there is significant improvement in the numbness of his extremities.  Physical Exam: Basal crackles. S1-S2 present Bowel sound present Lower extremity edema. Asterixis positive.  Data Reviewed: I have Reviewed nursing notes, Vitals, and Lab results. Since last encounter, pertinent lab results CBC BMP   . I have ordered test including CBC BMP VBG ABG ammonia  .   Disposition: Status is: Inpatient Remains inpatient appropriate because: Monitor for improvement in mentation and respiratory status   apixaban (ELIQUIS) tablet 5 mg   Family Communication: No one at bedside Level of care: Progressive   Vitals:   05/10/24 1315 05/10/24 1320 05/10/24 1400 05/10/24 1540  BP: 91/64 94/67  92/61  Pulse: 90 93  94 93  Resp: 16 (!) 24  16  Temp:   97.7 F (36.5 C) 97.7 F (36.5 C)  TempSrc:   Oral Oral  SpO2: 96% 96%  98%  Weight:      Height:         Author: Yetta Blanch, MD 05/10/2024 6:00 PM  Please look on [email or web address] to find out who is on call.

## 2024-05-10 NOTE — Anesthesia Preprocedure Evaluation (Addendum)
 Anesthesia Evaluation  Patient identified by MRN, date of birth, ID band Patient awake    Reviewed: Allergy & Precautions, NPO status , Patient's Chart, lab work & pertinent test results  History of Anesthesia Complications Negative for: history of anesthetic complications  Airway Mallampati: III  TM Distance: >3 FB Neck ROM: Full   Comment: Previous grade I view with MAC 4, required 2-person mask with OPA Dental  (+) Dental Advisory Given, Chipped,    Pulmonary neg shortness of breath, sleep apnea , neg COPD, neg recent URI, former smoker   Pulmonary exam normal breath sounds clear to auscultation       Cardiovascular pulmonary hypertension+CHF (EF < 20%)  + dysrhythmias Atrial Fibrillation + pacemaker + Cardiac Defibrillator  Rhythm:Irregular Rate:Normal  TTE 05/04/2024: IMPRESSIONS    1. Left ventricular ejection fraction, by estimation, is <20%. The left  ventricle has severely decreased function. The left ventricle demonstrates  global hypokinesis. The left ventricular internal cavity size was severely  dilated. Left ventricular  diastolic parameters are indeterminate.   2. Right ventricular systolic function is mildly reduced. The right  ventricular size is severely enlarged. There is moderately elevated  pulmonary artery systolic pressure.   3. Left atrial size was moderately dilated.   4. Right atrial size was severely dilated.   5. The mitral valve is normal in structure. No evidence of mitral valve  regurgitation.   6. The aortic valve is normal in structure. There is mild calcification  of the aortic valve. Aortic valve regurgitation is not visualized. No  aortic stenosis is present.     Neuro/Psych neg Seizures    GI/Hepatic negative GI ROS,,,(+) Cirrhosis         Endo/Other  diabetes, Type 2  Class 4 obesity  Renal/GU negative Renal ROS     Musculoskeletal   Abdominal  (+) + obese  Peds   Hematology  (+) Blood dyscrasia, anemia Lab Results      Component                Value               Date                      WBC                      10.6 (H)            05/10/2024                HGB                      11.5 (L)            05/10/2024                HCT                      35.9 (L)            05/10/2024                MCV                      76.9 (L)            05/10/2024                PLT  341                 05/10/2024              Anesthesia Other Findings Admitted with volume overload. Started dobutamine 11/5, diuresing well on Lasix  gtt  Last Mounjaro: not taking  Reproductive/Obstetrics                              Anesthesia Physical Anesthesia Plan  ASA: 4  Anesthesia Plan: MAC   Post-op Pain Management: Minimal or no pain anticipated and Precedex   Induction: Intravenous  PONV Risk Score and Plan: 1 and Propofol  infusion, TIVA and Treatment may vary due to age or medical condition  Airway Management Planned: Natural Airway and Simple Face Mask  Additional Equipment:   Intra-op Plan:   Post-operative Plan:   Informed Consent: I have reviewed the patients History and Physical, chart, labs and discussed the procedure including the risks, benefits and alternatives for the proposed anesthesia with the patient or authorized representative who has indicated his/her understanding and acceptance.     Dental advisory given  Plan Discussed with: CRNA and Anesthesiologist  Anesthesia Plan Comments: (Discussed with patient risks of MAC including, but not limited to, minor pain or discomfort, hearing people in the room, and possible need for backup general anesthesia. Risks for general anesthesia also discussed including, but not limited to, sore throat, hoarse voice, chipped/damaged teeth, injury to vocal cords, nausea and vomiting, allergic reactions, lung infection, heart attack, stroke, and death. All  questions answered. )         Anesthesia Quick Evaluation

## 2024-05-10 NOTE — H&P (View-Only) (Signed)
 Patient ID: Danner Paulding, male   DOB: [birth year], 48 y.o.   MRN: [id number]     Advanced Heart Failure Rounding Note  Cardiologist: None  Chief Complaint: Cardiogenic Shock Subjective:    He is on DBA 5, no co-ox yet this morning. CVP around 9.  Excellent diuresis, weight down 6 lbs.  Pending creatinine.   He remains in atrial fibrillation on amiodarone 30 mg/hr and apixaban.   No dyspnea, having hard time using CPAP.   Objective:    Weight Range: (!) 147.2 kg Body mass index is 52.38 kg/m.   Vital Signs:   Temp:  [97.8 F (36.6 C)-98.7 F (37.1 C)] 97.8 F (36.6 C) (11/10 0456) Pulse Rate:  [62-93] 85 (11/10 0456) Resp:  [15-37] 20 (11/10 0456) BP: (67-143)/(47-106) 98/72 (11/10 0456) SpO2:  [95 %-98 %] 95 % (11/10 0456) Weight:  [147.2 kg] 147.2 kg (11/10 0500) Last BM Date : 05/08/24  Weight change: Filed Weights   05/09/24 0346 05/10/24 0456 05/10/24 0500  Weight: (!) 149.8 kg (!) 147.2 kg (!) 147.2 kg   Intake/Output:  Intake/Output Summary (Last 24 hours) at 05/10/2024 0747 Last data filed at 05/10/2024 0500 Gross per 24 hour  Intake 1235.87 ml  Output 5630 ml  Net -4394.13 ml    Physical Exam    General: NAD Neck: Thick, JVP difficult, no thyromegaly or thyroid  nodule.  Lungs: Clear to auscultation bilaterally with normal respiratory effort. CV: Nondisplaced PMI.  Heart irregular S1/S2, no S3/S4, no murmur.  Trace ankle edema.  Abdomen: Soft, nontender, no hepatosplenomegaly, no distention.  Skin: Intact without lesions or rashes.  Neurologic: Alert and oriented x 3.  Psych: Normal affect. Extremities: No clubbing or cyanosis.  HEENT: Normal.    Telemetry   AF 70-80s (personally reviewed)  Labs    CBC Recent Labs    05/09/24 0356 05/10/24 0500  WBC 10.6* 10.6*  NEUTROABS  --  6.2  HGB 11.0* 11.5*  HCT 35.7* 35.9*  MCV 78.1* 76.9*  PLT 344 341   Basic Metabolic Panel Recent Labs    88/91/74 0525 05/09/24 0356  NA 131*  132*  K 3.7 4.1  CL 86* 88*  CO2 34* 31  GLUCOSE 103* 114*  BUN 19 22*  CREATININE 1.63* 1.56*  CALCIUM  8.6* 8.7*  MG 2.3 2.1   Liver Function Tests No results for input(s): AST, ALT, ALKPHOS, BILITOT, PROT, ALBUMIN in the last 72 hours.  BNP (last 3 results) Recent Labs    10/08/23 1006 03/08/24 1749 05/03/24 1104  BNP 181.5* 137.8* 836.5*   Medications:     Scheduled Medications:  apixaban  5 mg Oral BID   Chlorhexidine  Gluconate Cloth  6 each Topical Daily   dapagliflozin  propanediol  10 mg Oral Daily   digoxin   125 mcg Oral Daily   insulin aspart  0-15 Units Subcutaneous TID WC   insulin aspart  0-5 Units Subcutaneous QHS   melatonin  5 mg Oral QHS   multivitamin with minerals  1 tablet Oral Daily   polyethylene glycol  17 g Oral BID   sacubitril -valsartan   1 tablet Oral BID   senna-docusate  1 tablet Oral Daily   sodium chloride  flush  10-40 mL Intracatheter Q12H   sodium chloride  flush  3 mL Intravenous Q12H   spironolactone   50 mg Oral Daily   thiamine   100 mg Oral Daily    Infusions:  amiodarone 30 mg/hr (05/10/24 0517)   DOBUTamine     furosemide  (LASIX ) 200  mg in dextrose  5 % 100 mL (2 mg/mL) infusion 15 mg/hr (05/10/24 0518)   promethazine (PHENERGAN) injection (IM or IVPB)      PRN Medications: acetaminophen  **OR** acetaminophen , bisacodyl , ondansetron  **OR** ondansetron  (ZOFRAN ) IV, oxyCODONE , promethazine (PHENERGAN) injection (IM or IVPB), sodium chloride  flush, traZODone  Assessment/Plan   Cardiogenic Shock  Acute on chronic systolic CHF: Nonischemic cardiomyopathy. [employer] ICD. Symptomatic since 9/21 (after episode of peri-tonsillar abscess).  He drinks ETOH but does not seem to describe heavy enough ETOH to cause profound cardiomyopathy (though he did have mild cirrhosis on abdominal US  at [facility]). No FH of cardiomyopathy. Echo 11/21 with EF < 20% with severe LV dilation, moderate RVE with severely decreased systolic  function, severe biatrial enlargement, mild MR. [facility] 12/21 showed no coronary disease, markedly elevated filling pressures, and low cardiac output. Cardiac MRI in 11/21 showed severely dilated LV with diffuse hypokinesis, EF 17%, mod dilated RV with EF 20%, diffuse mid-wall LGE in septum, inferior wall & inferolateral wall.  LGE pattern suggests possible prior myocarditis. Echo in 3/22 showed that EF remains < 20% with mild RV dysfunction. CPX in 4/22 showed only a mild HF limitation.  Trypanosoma cruzi antibodies were negative (had h/o neurocysticercosis), probably not cardiomyopathy related to trypanosomiasis. Echo 1/25 showed EF 20-25%, G3DD, RV mildly reduced. Repeat echo 11/4 with EF <20%, mildly reduced RV. - Admitted with NYHA IV symptoms, suspect HF exacerbation triggered by onset of atrial fibrillation. Compliance appears to be an issue. Had been off some medications. - Decrease dobutamine to 2.5 mcg/kg/min today, needs co-ox sent.   - CVP around 9 today, excellent diuresis yesterday on Lasix  + metolazone  and acetazolamide.  I will continue Lasix  gtt 15 mg/hr for 1 more day but will hold off on metolazone /acetazolamide.  Hopefully to po tomorrow.   - continue farxiga  10 mg daily - continue spironolactone  to 50 mg daily and replace K.  - continue digoxin  0.125 daily - continue entresto  49/51 mg bid for now. SBP 90s at times, may need to cut back but will watch for now.   - stopped toprol  with low output - TEE/DCCV today. Discussed risks/benefits, he agrees with procedure.    Paroxysmal AFL - new as of ICM remote monitoring 04/17/24; see on ECG by PCP - Continue Eliquis.  - continue amio 30/hr (no bolus) for rate control - TEE/DCCV today, discussed risks/benefits and he agrees to procedure.    Anemia - PCP referred to GI due to concern for GIB - hgb stable today. Switch to eliquis - tsat 7; given IV Fe   Constipation - suspect related to increased intra-abdominal pressure - abd US  no  ascites - sennakot and PRN dulcolax/myralax   ETOH:   - He did have early cirrhosis on abdominal US  at [facility].  However, with obesity could be NAFLD. He is cutting back his beer intake - Discussed cessation.   OSA: Moderate by sleep study 08/2020. AHI 31.1/hr - Needs to wear CPAP   Obesity:  -  on Mounjaro as OP   SDOH:  - He is insured and has transportation. - Farxiga  $45/90 dayEntresto  $45/90 (can use co-pay cards for each)  Hypokalemia - Replace K.   Length of Stay: 7  Ezra Shuck, MD  05/10/2024, 7:47 AM  Advanced Heart Failure Team Pager [phone] (M-F; 7a - 5p)  Please contact [facility] for night-coverage after hours (5p -7a ) and weekends on [email or web address]

## 2024-05-10 NOTE — Procedures (Signed)
 Electrical Cardioversion Procedure Note Deborah Dondero [id number] 04-04-1976  Procedure: Electrical Cardioversion Indications:  Atrial Flutter  Procedure Details Consent: Risks of procedure as well as the alternatives and risks of each were explained to the (patient/caregiver).  Consent for procedure obtained. Time Out: Verified patient identification, verified procedure, site/side was marked, verified correct patient position, special equipment/implants available, medications/allergies/relevent history reviewed, required imaging and test results available.  Performed  Patient placed on cardiac monitor, pulse oximetry, supplemental oxygen as necessary.  Sedation given: Propofol  per anesthesiology Pacer pads placed anterior and posterior chest.  Cardioverted 1 time(s).  Cardioverted at 360J.  Evaluation Findings: Post procedure EKG shows: NSR Complications: None Patient did tolerate procedure well.   Ezra Shuck 05/10/2024, 12:16 PM

## 2024-05-10 NOTE — Transfer of Care (Signed)
 Immediate Anesthesia Transfer of Care Note  Patient: Kyle Pearson  Procedure(s) Performed: TRANSESOPHAGEAL ECHOCARDIOGRAM CARDIOVERSION  Patient Location: Radiology  Anesthesia Type:MAC  Level of Consciousness: drowsy and responds to stimulation  Airway & Oxygen Therapy: Patient Spontanous Breathing  Post-op Assessment: Report given to RN and Post -op Vital signs reviewed and stable  Post vital signs: Reviewed and stable  Last Vitals:  Vitals Value Taken Time  BP 109/74 (87)   Temp    Pulse 76 05/10/24 12:24  Resp 15 05/10/24 12:24  SpO2 95 % 05/10/24 12:24  Vitals shown include unfiled device data.  Last Pain:  Vitals:   05/10/24 1128  TempSrc: Temporal  PainSc: 0-No pain         Complications: No notable events documented.

## 2024-05-10 NOTE — Progress Notes (Signed)
 PT on CPAP at this time.   05/10/24 2228  BiPAP/CPAP/SIPAP  BiPAP/CPAP/SIPAP Pt Type Adult  BiPAP/CPAP/SIPAP Resmed  Mask Type Full face mask  Mask Size Large  Respiratory Rate 15 breaths/min  FiO2 (%) 21 %  Flow Rate 0 lpm  Minute Ventilation 14.1  Leak 0  Tidal Volume (Vt) 801  Patient Home Machine No  Patient Home Mask No  Patient Home Tubing No  Auto Titrate Yes  Minimum cmH2O 6 cmH2O  Maximum cmH2O 12 cmH2O  Nasal massage performed Yes  CPAP/SIPAP surface wiped down Yes  Device Plugged into RED Power Outlet Yes

## 2024-05-10 NOTE — Hospital Course (Addendum)
 47/M, morbidly obese with chronic systolic CHF/NICM, paroxysmal A-fib, EtOH abuse, liver cirrhosis, OSA, presented to GI office 11/3 to discuss colonoscopy, he was noted to be significantly volume overloaded and  in atrial flutter with RVR, sent to the ED, questionable compliance, and still does not have his CPAP. - In the ED A-fib RVR, blood pressure in the 110s, BNP 836, troponin 91, creatinine 1.5, sodium 132, WBC 12.2 - 11/4 heart failure team consulting, started on Lasix  gtt. - 11/5, concern for cardiogenic shock, low output, starting dobutamine -11/6: Brisk diuresis  - 11/9 reports numbness all over his body.   Assessment and Plan: Acute on chronic systolic CHF, NICM Cardiogenic shock, low output - Last echo 1/25 noted EF 20-25, grade 3 DD, mildly reduced RV, LHC 12/21 noted no CAD - Significantly volume overloaded on admission this time, concern for compliance with medications - Heart failure team following, - Started dobutamine 11/5, diuresing well on Lasix  gtt., - Per heart failure team -Toprol  discontinued - Continue Aldactone  Farxiga  and digoxin    Atrial flutter with RVR - Recently anticoagulation held due to concern for GI bleed - Currently on heparin  gtt. and Amio gtt. Underwent cardioversion on 11/10.  Monitor.   Severe iron deficiency anemia - Given, monitor for GI bleed on anticoagulation-on IV heparin  - Anemia panel with severe iron deficiency - Given IV iron X2 doses   Liver cirrhosis EtOH abuse - Ultrasound now with no ascites - Diuretics as noted above, EtOH cessation counseled - Thiamine , multivitamin, monitor for withdrawal   Moderate OSA - Reports that his CPAP is yet to be delivered but last sleep study was in 2022 - Order CPAP nightly, TOC consult for home CPAP, may need a new sleep study   Morbid obesity, BMI is 55.5 - On Mounjaro now   Constipation Now on laxatives, monitor   Diffuse nerve pain. Patient reports that he has diffuse numbness and  tingling all over his body. Also has some pain in his legs and arm as well as around his jaw which feels like nerve pain. Etiology not clear. Vitamin levels have been adequate. No focal deficit. For now we will monitor. Unsure if it is rare but potential amiodarone toxicity.  Will monitor.  Myoclonic jerks. Patient reports that he has myoclonic jerks.  Will initiate lactulose given history of cirrhosis. Check ammonia level tomorrow. No focal deficit. ABG and VBG reassuring.

## 2024-05-11 DIAGNOSIS — R570 Cardiogenic shock: Secondary | ICD-10-CM | POA: Diagnosis not present

## 2024-05-11 DIAGNOSIS — I5023 Acute on chronic systolic (congestive) heart failure: Secondary | ICD-10-CM | POA: Diagnosis not present

## 2024-05-11 DIAGNOSIS — I4892 Unspecified atrial flutter: Secondary | ICD-10-CM | POA: Diagnosis not present

## 2024-05-11 LAB — COMPREHENSIVE METABOLIC PANEL WITH GFR
ALT: 67 U/L — ABNORMAL HIGH (ref 0–44)
AST: 98 U/L — ABNORMAL HIGH (ref 15–41)
Albumin: 3.4 g/dL — ABNORMAL LOW (ref 3.5–5.0)
Alkaline Phosphatase: 65 U/L (ref 38–126)
Anion gap: 15 (ref 5–15)
BUN: 32 mg/dL — ABNORMAL HIGH (ref 6–20)
CO2: 29 mmol/L (ref 22–32)
Calcium: 9.1 mg/dL (ref 8.9–10.3)
Chloride: 83 mmol/L — ABNORMAL LOW (ref 98–111)
Creatinine, Ser: 1.92 mg/dL — ABNORMAL HIGH (ref 0.61–1.24)
GFR, Estimated: 43 mL/min — ABNORMAL LOW (ref >60)
Glucose, Bld: 120 mg/dL — ABNORMAL HIGH (ref 70–99)
Potassium: 4 mmol/L (ref 3.5–5.1)
Sodium: 127 mmol/L — ABNORMAL LOW (ref 135–145)
Total Bilirubin: 0.8 mg/dL (ref 0.0–1.2)
Total Protein: 7.9 g/dL (ref 6.5–8.1)

## 2024-05-11 LAB — CBC WITH DIFFERENTIAL/PLATELET
Abs Immature Granulocytes: 0.07 K/uL (ref 0.00–0.07)
Basophils Absolute: 0.1 K/uL (ref 0.0–0.1)
Basophils Relative: 1 %
Eosinophils Absolute: 0.5 K/uL (ref 0.0–0.5)
Eosinophils Relative: 5 %
HCT: 36.4 % — ABNORMAL LOW (ref 39.0–52.0)
Hemoglobin: 11.6 g/dL — ABNORMAL LOW (ref 13.0–17.0)
Immature Granulocytes: 1 %
Lymphocytes Relative: 25 %
Lymphs Abs: 2.4 K/uL (ref 0.7–4.0)
MCH: 24.4 pg — ABNORMAL LOW (ref 26.0–34.0)
MCHC: 31.9 g/dL (ref 30.0–36.0)
MCV: 76.5 fL — ABNORMAL LOW (ref 80.0–100.0)
Monocytes Absolute: 0.8 K/uL (ref 0.1–1.0)
Monocytes Relative: 9 %
Neutro Abs: 5.8 K/uL (ref 1.7–7.7)
Neutrophils Relative %: 59 %
Platelets: 330 K/uL (ref 150–400)
RBC: 4.76 MIL/uL (ref 4.22–5.81)
RDW: 18.9 % — ABNORMAL HIGH (ref 11.5–15.5)
WBC: 9.7 K/uL (ref 4.0–10.5)
nRBC: 0 % (ref 0.0–0.2)

## 2024-05-11 LAB — GLUCOSE, CAPILLARY
Glucose-Capillary: 137 mg/dL — ABNORMAL HIGH (ref 70–99)
Glucose-Capillary: 115 mg/dL — ABNORMAL HIGH (ref 70–99)
Glucose-Capillary: 150 mg/dL — ABNORMAL HIGH (ref 70–99)
Glucose-Capillary: 115 mg/dL — ABNORMAL HIGH (ref 70–99)

## 2024-05-11 LAB — COOXEMETRY PANEL
Carboxyhemoglobin: 1 % (ref 0.5–1.5)
Methemoglobin: <0.7 % (ref 0.0–1.5)
O2 Saturation: 65.6 %
Total hemoglobin: 11.6 g/dL — ABNORMAL LOW (ref 12.0–16.0)

## 2024-05-11 LAB — ECHO TEE
Est EF: <20
Single Plane A4C EF: 6.9 %

## 2024-05-11 LAB — AMMONIA: Ammonia: 25 umol/L (ref 9–35)

## 2024-05-11 LAB — MAGNESIUM: Magnesium: 2.3 mg/dL (ref 1.7–2.4)

## 2024-05-11 MED ORDER — SACUBITRIL-VALSARTAN 24-26 MG PO TABS
1.0000 | ORAL_TABLET | Freq: Two times a day (BID) | ORAL | Status: DC
  Administered 2024-05-11 – 2024-05-12 (×2): 1 via ORAL
  Filled 2024-05-11 (×2): qty 1

## 2024-05-11 NOTE — Progress Notes (Signed)
 Reports feeling woozy this afternoon. His CVP is < 5 and BP soft today.  Can liberalize po intake.   Decrease entresto  to 24/26 mg BID. Stopped farxiga  for now. Can potentially add back tomorrow once volume status is reassessed.

## 2024-05-11 NOTE — Progress Notes (Signed)
 Patient ID: Kyle Pearson, male   DOB: August 10, 1975, 48 y.o.   MRN: [id number]     Advanced Heart Failure Rounding Note  Cardiologist: None  Chief Complaint: Cardiogenic Shock Subjective:    He is on DBA 2.5, co-ox 66%. CVP around 4.  Excellent diuresis, weight down 6 lbs again.  Creatinine 1.74 => 1.92.   TEE-guided DCCV on 11/10, he remains in NSR on amiodarone gtt 30 mg/hr.    Breathing better.   Objective:    Weight Range: (!) 144.6 kg Body mass index is 51.45 kg/m.   Vital Signs:   Temp:  [97.7 F (36.5 C)-98 F (36.7 C)] 98 F (36.7 C) (11/11 0406) Pulse Rate:  [69-96] 82 (11/11 0406) Resp:  [14-24] 18 (11/11 0406) BP: (88-128)/(57-85) 91/57 (11/11 0406) SpO2:  [87 %-98 %] 98 % (11/11 0406) FiO2 (%):  [21 %] 21 % (11/10 2228) Weight:  [144.6 kg] 144.6 kg (11/11 0548) Last BM Date : 05/10/24  Weight change: Filed Weights   05/10/24 0456 05/10/24 0500 05/11/24 0548  Weight: (!) 147.2 kg (!) 147.2 kg (!) 144.6 kg   Intake/Output:  Intake/Output Summary (Last 24 hours) at 05/11/2024 0750 Last data filed at 05/11/2024 0705 Gross per 24 hour  Intake 1529.59 ml  Output 4030 ml  Net -2500.41 ml    Physical Exam    General: NAD Neck: Thick, JVP difficult, no thyromegaly or thyroid  nodule.  Lungs: Clear to auscultation bilaterally with normal respiratory effort. CV: Nondisplaced PMI.  Heart regular S1/S2, no S3/S4, no murmur.  No peripheral edema.    Abdomen: Soft, nontender, no hepatosplenomegaly, no distention.  Skin: Intact without lesions or rashes.  Neurologic: Alert and oriented x 3.  Psych: Normal affect. Extremities: No clubbing or cyanosis.  HEENT: Normal.    Telemetry   NSR 80s (personally reviewed)  Labs    CBC Recent Labs    05/10/24 0500 05/11/24 0428  WBC 10.6* 9.7  NEUTROABS 6.2 5.8  HGB 11.5* 11.6*  HCT 35.9* 36.4*  MCV 76.9* 76.5*  PLT 341 330   Basic Metabolic Panel Recent Labs    88/89/74 0500 05/11/24 0428  NA  129* 127*  K 3.8 4.0  CL 84* 83*  CO2 30 29  GLUCOSE 180* 120*  BUN 26* 32*  CREATININE 1.74* 1.92*  CALCIUM  8.9 9.1  MG 1.9 2.3   Liver Function Tests Recent Labs    05/10/24 0500 05/11/24 0428  AST 65* 98*  ALT 48* 67*  ALKPHOS 67 65  BILITOT 1.1 0.8  PROT 7.3 7.9  ALBUMIN 3.1* 3.4*    BNP (last 3 results) Recent Labs    10/08/23 1006 03/08/24 1749 05/03/24 1104  BNP 181.5* 137.8* 836.5*   Medications:     Scheduled Medications:  apixaban  5 mg Oral BID   Chlorhexidine  Gluconate Cloth  6 each Topical Daily   dapagliflozin  propanediol  10 mg Oral Daily   digoxin   125 mcg Oral Daily   insulin aspart  0-15 Units Subcutaneous TID WC   insulin aspart  0-5 Units Subcutaneous QHS   lactulose  20 g Oral Daily   melatonin  5 mg Oral QHS   multivitamin with minerals  1 tablet Oral Daily   polyethylene glycol  17 g Oral BID   sacubitril -valsartan   1 tablet Oral BID   senna-docusate  1 tablet Oral Daily   sodium chloride  flush  10-40 mL Intracatheter Q12H   sodium chloride  flush  3 mL Intravenous Q12H  spironolactone   50 mg Oral Daily   thiamine   100 mg Oral Daily    Infusions:  amiodarone 30 mg/hr (05/11/24 0703)    PRN Medications: acetaminophen  **OR** acetaminophen , bisacodyl , ondansetron  **OR** ondansetron  (ZOFRAN ) IV, oxyCODONE , sodium chloride  flush, traZODone  Assessment/Plan   Cardiogenic Shock  Acute on chronic systolic CHF: Nonischemic cardiomyopathy. [employer] ICD. Symptomatic since 9/21 (after episode of peri-tonsillar abscess).  He drinks ETOH but does not seem to describe heavy enough ETOH to cause profound cardiomyopathy (though he did have mild cirrhosis on abdominal US  at [facility]). No FH of cardiomyopathy. Echo 11/21 with EF < 20% with severe LV dilation, moderate RVE with severely decreased systolic function, severe biatrial enlargement, mild MR. [facility] 12/21 showed no coronary disease, markedly elevated filling pressures, and low cardiac  output. Cardiac MRI in 11/21 showed severely dilated LV with diffuse hypokinesis, EF 17%, mod dilated RV with EF 20%, diffuse mid-wall LGE in septum, inferior wall & inferolateral wall.  LGE pattern suggests possible prior myocarditis. Echo in 3/22 showed that EF remains < 20% with mild RV dysfunction. CPX in 4/22 showed only a mild HF limitation.  Trypanosoma cruzi antibodies were negative (had h/o neurocysticercosis), probably not cardiomyopathy related to trypanosomiasis. Echo 1/25 showed EF 20-25%, G3DD, RV mildly reduced. Repeat echo 11/4 with EF <20%, mildly reduced RV. - Admitted with NYHA IV symptoms, suspect HF exacerbation triggered by onset of atrial fibrillation. Compliance appears to be an issue. Had been off some medications. - Co-ox 66%, stop dobutamine today.   - Weight down 6 lbs, CVP 4 today.  Stop IV Lasix .   - continue farxiga  10 mg daily - continue spironolactone  to 50 mg daily.   - continue digoxin  0.125 daily - continue Entresto  49/51 mg bid for now. SBP 90s at times, may need to cut back but will watch for now.   - stopped toprol  with low output    Atrial flutter - new as of ICM remote monitoring 04/17/24; see on ECG by PCP - Continue Eliquis.  - TEE-guided DCCV 11/10, remains in NSR today.  - Continue amiodarone gtt today and turning off dobutamine.  To po amiodarone tomorrow.     Anemia - PCP referred to GI due to concern for GIB - hgb stable today, now on Eliquis.  - Tsat 7; given IV Fe   ETOH:   - He did have early cirrhosis on abdominal US  at [facility].  However, with obesity could be NAFLD. He is cutting back his beer intake - Mildly elevated transaminases.  - Discussed cessation.   OSA: Moderate by sleep study 08/2020. AHI 31.1/hr - Needs to wear CPAP   Obesity:  -  on Mounjaro as OP   SDOH:  - He is insured and has transportation. - Farxiga  $45/90 dayEntresto  $45/90 (can use co-pay cards for each)  Length of Stay: 8  Ezra Shuck, MD  05/11/2024,  7:50 AM  Advanced Heart Failure Team Pager [phone] (M-F; 7a - 5p)  Please contact [facility] for night-coverage after hours (5p -7a ) and weekends on [email or web address]

## 2024-05-11 NOTE — Plan of Care (Signed)
  Problem: Clinical Measurements: Goal: Will remain free from infection Outcome: Progressing Goal: Cardiovascular complication will be avoided Outcome: Progressing   Problem: Nutrition: Goal: Adequate nutrition will be maintained Outcome: Progressing   Problem: Elimination: Goal: Will not experience complications related to bowel motility Outcome: Progressing Goal: Will not experience complications related to urinary retention Outcome: Progressing   Problem: Safety: Goal: Ability to remain free from injury will improve Outcome: Progressing

## 2024-05-11 NOTE — Progress Notes (Signed)
 [facility] Progress Note Patient: Kyle Pearson FMW:[id number] DOB: Nov 11, 1975  DOA: 05/03/2024 DOS: the patient was seen and examined on 05/11/2024  Brief Hospital Course: 47/M, morbidly obese with chronic systolic CHF/NICM, paroxysmal A-fib, EtOH abuse, liver cirrhosis, OSA, presented to GI office 11/3 to discuss colonoscopy, he was noted to be significantly volume overloaded and  in atrial flutter with RVR, sent to the ED, questionable compliance, and still does not have his CPAP. - In the ED A-fib RVR, blood pressure in the 110s, BNP 836, troponin 91, creatinine 1.5, sodium 132, WBC 12.2 - 11/4 heart failure team consulting, started on Lasix  gtt. - 11/5, concern for cardiogenic shock, low output, starting dobutamine -11/6: Brisk diuresis  - 11/9 reports numbness all over his body.   Assessment and Plan: Acute on chronic systolic CHF, NICM Cardiogenic shock, low output - Last echo 1/25 noted EF 20-25, grade 3 DD, mildly reduced RV, LHC 12/21 noted no CAD - Significantly volume overloaded on admission this time, concern for compliance with medications - Heart failure team following, - Started dobutamine 11/5, diuresing well on Lasix  gtt., - Per heart failure team -Toprol  discontinued - Continue Aldactone  Farxiga  and digoxin    Atrial flutter with RVR - Recently anticoagulation held due to concern for GI bleed - Currently on heparin  gtt. and Amio gtt. Underwent cardioversion on 11/10.  Monitor.   Severe iron deficiency anemia - Given, monitor for GI bleed on anticoagulation-on IV heparin  - Anemia panel with severe iron deficiency - Given IV iron X2 doses   Liver cirrhosis EtOH abuse - Ultrasound now with no ascites - Diuretics as noted above, EtOH cessation counseled - Thiamine , multivitamin, monitor for withdrawal   Moderate OSA - Reports that his CPAP is yet to be delivered but last sleep study was in 2022 - Order CPAP nightly, TOC consult for home CPAP, may  need a new sleep study   Morbid obesity, BMI is 55.5 - On Mounjaro now   Constipation Now on laxatives, monitor   Diffuse nerve pain. Patient reports that he has diffuse numbness and tingling all over his body. Also has some pain in his legs and arm as well as around his jaw which feels like nerve pain. Etiology not clear. Vitamin levels have been adequate. No focal deficit. For now we will monitor.  Myoclonic jerks. Patient reports that he has myoclonic jerks. Given lactulose.  For now hold.  Ammonia level negative. No focal deficit. ABG and VBG reassuring.   Subjective: No nausea or vomiting.  No acute complaint.  Physical Exam: Basal crackles. Trace edema. Bowel sound present. No focal deficit.  No structures.  Data Reviewed: I have Reviewed nursing notes, Vitals, and Lab results. Since last encounter, pertinent lab results CBC and BMP   . I have ordered test including CBC and BMP  . I have discussed pt's care plan and test results with cardiology  .    Disposition: Status is: Inpatient Remains inpatient appropriate because: Monitor for improvement in volume status  apixaban (ELIQUIS) tablet 5 mg    Family Communication: No one at bedside Level of care: Progressive   Vitals:   05/11/24 0406 05/11/24 0548 05/11/24 0813 05/11/24 1226  BP: (!) 91/57  92/77 94/63  Pulse: 82  77 79  Resp: 18  20 18   Temp: 98 F (36.7 C)  98 F (36.7 C) 97.8 F (36.6 C)  TempSrc: Oral  Oral Oral  SpO2: 98%  94% 99%  Weight:  (!) 144.6 kg  Height:         Author: Yetta Blanch, MD 05/11/2024 6:55 PM  Please look on [email or web address] to find out who is on call.

## 2024-05-11 NOTE — Plan of Care (Signed)
  Problem: Education: Goal: Ability to describe self-care measures that may prevent or decrease complications (Diabetes Survival Skills Education) will improve Outcome: Progressing   Problem: Tissue Perfusion: Goal: Adequacy of tissue perfusion will improve Outcome: Progressing

## 2024-05-12 ENCOUNTER — Other Ambulatory Visit (HOSPITAL_COMMUNITY): Payer: Self-pay

## 2024-05-12 DIAGNOSIS — I5023 Acute on chronic systolic (congestive) heart failure: Secondary | ICD-10-CM | POA: Diagnosis not present

## 2024-05-12 LAB — CBC WITH DIFFERENTIAL/PLATELET
Abs Immature Granulocytes: 0.06 K/uL (ref 0.00–0.07)
Basophils Absolute: 0.1 K/uL (ref 0.0–0.1)
Basophils Relative: 1 %
Eosinophils Absolute: 0.7 K/uL — ABNORMAL HIGH (ref 0.0–0.5)
Eosinophils Relative: 6 %
HCT: 36.1 % — ABNORMAL LOW (ref 39.0–52.0)
Hemoglobin: 11.7 g/dL — ABNORMAL LOW (ref 13.0–17.0)
Immature Granulocytes: 1 %
Lymphocytes Relative: 29 %
Lymphs Abs: 3.3 K/uL (ref 0.7–4.0)
MCH: 24.7 pg — ABNORMAL LOW (ref 26.0–34.0)
MCHC: 32.4 g/dL (ref 30.0–36.0)
MCV: 76.2 fL — ABNORMAL LOW (ref 80.0–100.0)
Monocytes Absolute: 1.1 K/uL — ABNORMAL HIGH (ref 0.1–1.0)
Monocytes Relative: 9 %
Neutro Abs: 6.3 K/uL (ref 1.7–7.7)
Neutrophils Relative %: 54 %
Platelets: 303 K/uL (ref 150–400)
RBC: 4.74 MIL/uL (ref 4.22–5.81)
RDW: 19 % — ABNORMAL HIGH (ref 11.5–15.5)
WBC: 11.5 K/uL — ABNORMAL HIGH (ref 4.0–10.5)
nRBC: 0 % (ref 0.0–0.2)

## 2024-05-12 LAB — COMPREHENSIVE METABOLIC PANEL WITH GFR
ALT: 59 U/L — ABNORMAL HIGH (ref 0–44)
AST: 73 U/L — ABNORMAL HIGH (ref 15–41)
Albumin: 3.3 g/dL — ABNORMAL LOW (ref 3.5–5.0)
Alkaline Phosphatase: 67 U/L (ref 38–126)
Anion gap: 11 (ref 5–15)
BUN: 42 mg/dL — ABNORMAL HIGH (ref 6–20)
CO2: 30 mmol/L (ref 22–32)
Calcium: 8.5 mg/dL — ABNORMAL LOW (ref 8.9–10.3)
Chloride: 85 mmol/L — ABNORMAL LOW (ref 98–111)
Creatinine, Ser: 1.93 mg/dL — ABNORMAL HIGH (ref 0.61–1.24)
GFR, Estimated: 42 mL/min — ABNORMAL LOW (ref >60)
Glucose, Bld: 130 mg/dL — ABNORMAL HIGH (ref 70–99)
Potassium: 3.5 mmol/L (ref 3.5–5.1)
Sodium: 126 mmol/L — ABNORMAL LOW (ref 135–145)
Total Bilirubin: 0.6 mg/dL (ref 0.0–1.2)
Total Protein: 7.2 g/dL (ref 6.5–8.1)

## 2024-05-12 LAB — GLUCOSE, CAPILLARY
Glucose-Capillary: 115 mg/dL — ABNORMAL HIGH (ref 70–99)
Glucose-Capillary: 118 mg/dL — ABNORMAL HIGH (ref 70–99)

## 2024-05-12 LAB — COOXEMETRY PANEL
Carboxyhemoglobin: 1.9 % — ABNORMAL HIGH (ref 0.5–1.5)
Methemoglobin: <0.7 % (ref 0.0–1.5)
O2 Saturation: 70.7 %
Total hemoglobin: 12 g/dL (ref 12.0–16.0)

## 2024-05-12 LAB — MAGNESIUM: Magnesium: 2.3 mg/dL (ref 1.7–2.4)

## 2024-05-12 MED ORDER — POTASSIUM CHLORIDE CRYS ER 20 MEQ PO TBCR: 40.0000 meq | EXTENDED_RELEASE_TABLET | Freq: Two times a day (BID) | ORAL | Status: DC

## 2024-05-12 MED ORDER — TORSEMIDE 20 MG PO TABS: 40.0000 mg | ORAL_TABLET | Freq: Every day | ORAL | Status: DC

## 2024-05-12 MED ORDER — AMIODARONE HCL 200 MG PO TABS
400.0000 mg | ORAL_TABLET | Freq: Every day | ORAL | Status: DC
  Administered 2024-05-12: 400 mg via ORAL
  Filled 2024-05-12: qty 2

## 2024-05-12 MED ORDER — APIXABAN 5 MG PO TABS
5.0000 mg | ORAL_TABLET | Freq: Two times a day (BID) | ORAL | 0 refills | Status: DC
  Filled 2024-05-12: qty 60, 30d supply, fill #0

## 2024-05-12 MED ORDER — THIAMINE HCL 100 MG PO TABS
100.0000 mg | ORAL_TABLET | Freq: Every day | ORAL | 0 refills | Status: DC
  Filled 2024-05-12: qty 30, 30d supply, fill #0

## 2024-05-12 MED ORDER — SACUBITRIL-VALSARTAN 24-26 MG PO TABS
1.0000 | ORAL_TABLET | Freq: Two times a day (BID) | ORAL | 0 refills | Status: DC
  Filled 2024-05-12: qty 60, 30d supply, fill #0

## 2024-05-12 MED ORDER — AMIODARONE HCL 200 MG PO TABS
ORAL_TABLET | ORAL | 0 refills | Status: DC
  Filled 2024-05-12: qty 44, 37d supply, fill #0

## 2024-05-12 MED ORDER — POTASSIUM CHLORIDE CRYS ER 20 MEQ PO TBCR: 20.0000 meq | EXTENDED_RELEASE_TABLET | Freq: Two times a day (BID) | ORAL | Status: DC

## 2024-05-12 MED ORDER — SPIRONOLACTONE 50 MG PO TABS
50.0000 mg | ORAL_TABLET | Freq: Every day | ORAL | 0 refills | Status: DC
  Filled 2024-05-12: qty 30, 30d supply, fill #0

## 2024-05-12 MED ORDER — GUAIFENESIN ER 600 MG PO TB12
600.0000 mg | ORAL_TABLET | Freq: Two times a day (BID) | ORAL | Status: DC
  Administered 2024-05-12: 600 mg via ORAL
  Filled 2024-05-12: qty 1

## 2024-05-12 MED ORDER — POTASSIUM CHLORIDE CRYS ER 20 MEQ PO TBCR
40.0000 meq | EXTENDED_RELEASE_TABLET | Freq: Once | ORAL | Status: AC
  Administered 2024-05-12: 40 meq via ORAL
  Filled 2024-05-12: qty 2

## 2024-05-12 NOTE — Progress Notes (Signed)
 PICC removed per protocol per MD order. Manual pressure applied for 5 mins. Vaseline gauze, gauze, and Tegaderm applied over insertion site. No bleeding or swelling noted. Instructed patient to remain in bed for thirty mins. Educated patient about S/S of infection and when to call MD; no heavy lifting or pressure on right side for 24 hours; keep dressing dry and intact for 24 hours. Pt verbalized comprehension.

## 2024-05-12 NOTE — TOC Progression Note (Addendum)
 Transition of Care [facility]) - Progression Note    Patient Details  Name: Kyle Pearson MRN: [id number] Date of Birth: 11-Jun-1976  [facility] CM/SW Contact  Waddell Barnie Rama, RN Phone Number: 05/12/2024, 10:42 AM  Clinical Narrative:    For dc today, patient states he needs his cpap machine and he does not know the agency name that he got the supplies from,  NCM is checking with [employer] .  [employer] said patient did not get any supplies from them but they would be happy to assist with the cpap.  Patient is ok with [employer] supplying the cpap.  They will contact him outpatient to get the cpap set up for him.     Expected Discharge Plan: Home/Self Care Barriers to Discharge: Continued Medical Work up               Expected Discharge Plan and Services   Discharge Planning Services: CM Consult   Living arrangements for the past 2 months: Single Family Home Expected Discharge Date: 05/12/24                                     Social Drivers of Health (SDOH) Interventions SDOH Screenings   Food Insecurity: No Food Insecurity (05/04/2024)  Housing: Low Risk  (05/04/2024)  Transportation Needs: No Transportation Needs (05/04/2024)  Utilities: Not At Risk (05/04/2024)  Tobacco Use: Medium Risk (05/03/2024)    Readmission Risk Interventions     No data to display

## 2024-05-12 NOTE — Discharge Summary (Signed)
 Physician Discharge Summary   Patient: Kyle Pearson MRN: [id number] DOB: [birth year]  Admit date:     05/03/2024  Discharge date: 05/12/24  Discharge Physician: Yetta Blanch  PCP: Kayla Jeoffrey RAMAN, FNP  Recommendations at discharge: Follow-up with PCP in 1 week. Follow-up with cardiology as recommended. Repeat CBC and CMP.   Follow-up Information     [facility] Follow up on 05/24/2024.   Specialty: Cardiology Why: at  2:00 Contact information: [address] [place]  [address] [phone]        Kayla Jeoffrey RAMAN, FNP. Schedule an appointment as soon as possible for a visit on 05/19/2024.   Specialty: Family Medicine Why: with BMP lab to look at kidney/electrolyte numbers 10:45 am for hospital follow up also was already scheduled. Contact information: [address] [place] [address] [phone]         [facility] (DME) Follow up.   Specialty: DME Services Why: cpap machine, ( will work on as outpatient) Solicitor information: [address] [employer] [place]  [address] [phone]               Hospital Course: 48/M, morbidly obese with chronic systolic CHF/NICM, paroxysmal A-fib, EtOH abuse, liver cirrhosis, OSA, presented to GI office 11/3 to discuss colonoscopy, he was noted to be significantly volume overloaded and  in atrial flutter with RVR, sent to the ED, questionable compliance, and still does not have his CPAP. Advanced heart failure service was consulted.  Patient was started on dobutamine.  Midodrine was transition off of this medication. Cardiology recommended the patient can be discharged from their point of view on 11/12.   Assessment and Plan: Acute on chronic systolic CHF, NICM Cardiogenic shock, low output Last echo 1/25 noted EF 20-25, grade 3 DD, mildly reduced RV, LHC 12/21 noted no CAD Significantly volume overloaded on  admission this time, concern for compliance with medications Heart failure team was consulted.Started dobutamine 11/5,  was diuresing well on Lasix  gtt., Toprol  discontinued due to low output. Continue Aldactone  Farxiga  and digoxin  Sodium and LFT worsening.  Cardiology felt patient is stable from volume point of view.  Recommend discharge home.   Atrial flutter with RVR - Recently anticoagulation held due to concern for GI bleed - Currently on heparin  gtt. and Amio gtt. Underwent cardioversion on 11/10.  Monitor.   Severe iron deficiency anemia Given, monitor for GI bleed on anticoagulation-on IV heparin  Anemia panel with severe iron deficiency Given IV iron X2 doses H&H stable.  On Eliquis.   Liver cirrhosis EtOH abuse - Ultrasound now with no ascites - Diuretics as noted above, EtOH cessation counseled - Thiamine , multivitamin, monitor for withdrawal Referred to GI.   Moderate OSA - Reports that his CPAP is yet to be delivered but last sleep study was in 2022 - Order CPAP nightly, TOC consult for home CPAP, may need a new sleep study   Morbid obesity, BMI is 55.5 - On Mounjaro now   Constipation Now on laxatives, monitor   Diffuse nerve pain. Patient reports that he has diffuse numbness and tingling all over his body. Also has some pain in his legs and arm as well as around his jaw which feels like nerve pain. Etiology not clear. Vitamin levels have been adequate. No focal deficit. For now we will monitor.  Myoclonic jerks. Patient reports that he has myoclonic jerks. Given lactulose.  For now hold.  Ammonia level negative.  No focal deficit. ABG and VBG reassuring.  Hyponatremia and LFT elevation. In setting of heart failure. Close outpatient follow-up recommended. Recommended fluid restriction upon discharge.  Consultants:  Cardiology  Procedures performed:  Echocardiogram Cardioversion  DISCHARGE MEDICATION: Allergies as of 05/12/2024       Reactions    Raspberry Anaphylaxis   Penicillins Rash   Reaction: Childhood        Medication List     STOP taking these medications    metoprolol  succinate 100 MG 24 hr tablet Commonly known as: TOPROL -XL   sacubitril -valsartan  49-51 MG Commonly known as: ENTRESTO  Replaced by: sacubitril -valsartan  24-26 MG       TAKE these medications    amiodarone 200 MG tablet Commonly known as: PACERONE Take 2 tablets (400 mg total) by mouth daily for 7 days, THEN 1 tablet (200 mg total) daily. Start taking on: May 13, 2024   bisacodyl  5 MG EC tablet Commonly known as: DULCOLAX Take 5 mg by mouth daily as needed for mild constipation.   digoxin  0.125 MG tablet Commonly known as: LANOXIN  Take 1 tablet (125 mcg total) by mouth daily. NEEDS FOLLOW UP APPOINTMENT FOR MORE REFILLS   Eliquis 5 MG Tabs tablet Generic drug: apixaban Take 1 tablet (5 mg total) by mouth 2 (two) times daily.   Farxiga  10 MG Tabs tablet Generic drug: dapagliflozin  propanediol Take 1 tablet (10 mg total) by mouth daily before breakfast.   Iron (Ferrous Sulfate) 325 (65 Fe) MG Tabs Take 325 mg by mouth every other day.   potassium chloride  SA 20 MEQ tablet Commonly known as: Klor-Con  M20 Take 3 tablets (60 mEq total) by mouth daily. What changed: how much to take   sacubitril -valsartan  24-26 MG Commonly known as: ENTRESTO  Take 1 tablet by mouth 2 (two) times daily. Replaces: sacubitril -valsartan  49-51 MG   spironolactone  50 MG tablet Commonly known as: ALDACTONE  Take 1 tablet (50 mg total) by mouth daily. Start taking on: May 13, 2024 What changed:  medication strength how much to take   thiamine  100 MG tablet Commonly known as: VITAMIN B1 Take 1 tablet (100 mg total) by mouth daily.   tirzepatide 2.5 MG/0.5ML Pen Commonly known as: MOUNJARO Inject 2.5 mg into the skin once a week.   torsemide  20 MG tablet Commonly known as: DEMADEX  Take 2 tablets (40 mg total) by mouth daily.    Vitamin D3 50 MCG (2000 UT) capsule Take 1 capsule (2,000 Units total) by mouth daily.               Durable Medical Equipment  (From admission, onward)           Start     Ordered   05/12/24 1200  For home use only DME continuous positive airway pressure (CPAP)  Once       Comments: Pressure 4-20  Question Answer Comment  Length of Need Lifetime   Patient has OSA or probable OSA Yes   Is the patient currently using CPAP in the home Yes   Settings Other see comments   CPAP supplies needed Mask, headgear, cushions, filters, heated tubing and water chamber      05/12/24 1159           Disposition: Home Diet recommendation: Cardiac diet  Discharge Exam: Vitals:   05/12/24 1257 05/12/24 1259 05/12/24 1302 05/12/24 1521  BP: 122/80 123/80 132/77 102/68  Pulse:    89  Resp:    18  Temp:    98 F (36.7  C)  TempSrc:    Oral  SpO2:    97%  Weight:      Height:       General: in Mild distress, No Rash Cardiovascular: S1 and S2 Present, No Murmur Respiratory: Good respiratory effort, Bilateral Air entry present. No Crackles, No wheezes Abdomen: Bowel Sound present, No tenderness Extremities: Trace edema Neuro: Alert and oriented x3, no new focal deficit   Filed Weights   05/10/24 0500 05/11/24 0548 05/12/24 0509  Weight: (!) 147.2 kg (!) 144.6 kg (!) 146.2 kg   Condition at discharge: stable  The results of significant diagnostics from this hospitalization (including imaging, microbiology, ancillary and laboratory) are listed below for reference.   Imaging Studies: ECHO TEE Result Date: 05/11/2024    TRANSESOPHOGEAL ECHO REPORT   Patient Name:   WAYMAN HOARD Date of Exam: 05/10/2024 Medical Rec #:  [id number]           Height:       66.0 in Accession #:    [id number]          Weight:       324.5 lb Date of Birth:  [birth year]          BSA:          2.457 m Patient Age:    47 years            BP:           123/99 mmHg Patient Gender: M                    HR:           89 bpm. Exam Location:  Inpatient Procedure: Transesophageal Echo, 3D Echo, Cardiac Doppler and Color Doppler            (Both Spectral and Color Flow Doppler were utilized during            procedure). Indications:     I48.92* Unspecified atrial flutter  History:         Patient has prior history of Echocardiogram examinations, most                  recent 05/04/2024. CHF, Abnormal ECG, Pulmonary HTN,                  Arrythmias:Atrial Flutter, Signs/Symptoms:Dyspnea; Risk                  Factors:Diabetes and Sleep Apnea.  Sonographer:     Ellouise Mose RDCS Referring Phys:  [id number] EMELINE BRAVO SEGAL Diagnosing Phys: Ezra McleanMD PROCEDURE: After discussion of the risks and benefits of a TEE, an informed consent was obtained from the patient. The transesophogeal probe was passed without difficulty through the esophogus of the patient. Imaged were obtained with the patient in a left lateral decubitus position. Sedation performed by different physician. The patient was monitored while under deep sedation. Anesthestetic sedation was provided intravenously by Anesthesiology: 161mg  of Propofol , 100mg  of Lidocaine . The patient's vital signs; including heart rate, blood pressure, and oxygen saturation; remained stable throughout the procedure. The patient developed no complications during the procedure. A successful direct current cardioversion was performed at 360 joules with 1 attempt.  IMPRESSIONS  1. Left ventricular ejection fraction, by estimation, is <20%. The left ventricle has severely decreased function. The left ventricle demonstrates global hypokinesis. The left ventricular internal cavity size was moderately dilated.  2. Right ventricular systolic function is moderately reduced. The right ventricular  size is mildly enlarged. Tricuspid regurgitation signal is inadequate for assessing PA pressure.  3. Left atrial size was moderately dilated. No left atrial/left atrial appendage thrombus was detected.   4. Right atrial size was moderately dilated.  5. No PFO or ASD by color doppler.  6. The mitral valve is abnormal. Moderate to severe mitral valve regurgitation. PISA ERO 0.37 cm^2, vena contracta area 0.35 cm^2. Suspect functional MR with dilated mitral valve annulus. No systolic flow reversal in pulmonary vein doppler pattern. No evidence of mitral stenosis.  7. The aortic valve is tricuspid. Aortic valve regurgitation is not visualized. No aortic stenosis is present.  8. 3D performed of the mitral valve. FINDINGS  Left Ventricle: Left ventricular ejection fraction, by estimation, is <20%. The left ventricle has severely decreased function. The left ventricle demonstrates global hypokinesis. The left ventricular internal cavity size was moderately dilated. There is no left ventricular hypertrophy. Right Ventricle: The right ventricular size is mildly enlarged. No increase in right ventricular wall thickness. Right ventricular systolic function is moderately reduced. Tricuspid regurgitation signal is inadequate for assessing PA pressure. Left Atrium: Left atrial size was moderately dilated. No left atrial/left atrial appendage thrombus was detected. Right Atrium: Right atrial size was moderately dilated. Pericardium: There is no evidence of pericardial effusion. Mitral Valve: The mitral valve is abnormal. Moderate to severe mitral valve regurgitation. No evidence of mitral valve stenosis. Tricuspid Valve: The tricuspid valve is normal in structure. Tricuspid valve regurgitation is trivial. Aortic Valve: The aortic valve is tricuspid. Aortic valve regurgitation is not visualized. No aortic stenosis is present. Pulmonic Valve: The pulmonic valve was normal in structure. Pulmonic valve regurgitation is not visualized. Aorta: The aortic root and ascending aorta are structurally normal, with no evidence of dilitation. IAS/Shunts: No PFO or ASD by color doppler. Additional Comments: 3D was performed not requiring image  post processing on an independent workstation and was abnormal. Spectral Doppler performed.  LV Volumes (MOD) LV vol d, MOD A4C: 324.5 ml LV vol s, MOD A4C: 302.0 ml LV SV MOD A4C:     324.5 ml Dalton McleanMD Electronically signed by Ezra Kanner Signature Date/Time: 05/11/2024/5:13:48 PM    Final    EP STUDY Result Date: 05/10/2024 See surgical note for result.  DG CHEST PORT 1 VIEW Result Date: 05/04/2024 EXAM: 1 VIEW(S) XRAY OF THE CHEST 05/04/2024 06:11:00 PM COMPARISON: 05/03/2024 CLINICAL HISTORY: Status post PICC central line placement. FINDINGS: LINES, TUBES AND DEVICES: Subcutaneous defibrillator wire projects over the left chest, unchanged. Right PICC line tip in the SVC. LUNGS AND PLEURA: No focal pulmonary opacity. No pulmonary edema. No pleural effusion. No pneumothorax. HEART AND MEDIASTINUM: Cardiomegaly. BONES AND SOFT TISSUES: No acute osseous abnormality. IMPRESSION: 1. Right PICC line tip in the SVC. 2. Cardiomegaly. Electronically signed by: Franky Crease MD 05/04/2024 06:19 PM EST RP Workstation: [id number]   ECHOCARDIOGRAM COMPLETE Result Date: 05/04/2024    ECHOCARDIOGRAM REPORT   Patient Name:   KENN REKOWSKI Date of Exam: 05/04/2024 Medical Rec #:  [id number]           Height:       66.0 in Accession #:    [id number]          Weight:       344.0 lb Date of Birth:  [birth year]          BSA:          2.518 m Patient Age:    61 years  BP:           120/81 mmHg Patient Gender: M                   HR:           100 bpm. Exam Location:  Inpatient Procedure: 2D Echo and Intracardiac Opacification Agent (Both Spectral and Color            Flow Doppler were utilized during procedure). Indications:    CHF  History:        Patient has prior history of Echocardiogram examinations. CHF;                 Arrythmias:Atrial Fibrillation.  Sonographer:    Norleen Amour Referring Phys: [phone] EKTA V Daritza Brees IMPRESSIONS  1. Left ventricular ejection fraction, by estimation, is <20%. The  left ventricle has severely decreased function. The left ventricle demonstrates global hypokinesis. The left ventricular internal cavity size was severely dilated. Left ventricular diastolic parameters are indeterminate.  2. Right ventricular systolic function is mildly reduced. The right ventricular size is severely enlarged. There is moderately elevated pulmonary artery systolic pressure.  3. Left atrial size was moderately dilated.  4. Right atrial size was severely dilated.  5. The mitral valve is normal in structure. No evidence of mitral valve regurgitation.  6. The aortic valve is normal in structure. There is mild calcification of the aortic valve. Aortic valve regurgitation is not visualized. No aortic stenosis is present. Comparison(s): LVEF is worse from prior. FINDINGS  Left Ventricle: Left ventricular ejection fraction, by estimation, is <20%. The left ventricle has severely decreased function. The left ventricle demonstrates global hypokinesis. The left ventricular internal cavity size was severely dilated. There is no left ventricular hypertrophy. Left ventricular diastolic parameters are indeterminate. Right Ventricle: The right ventricular size is severely enlarged. No increase in right ventricular wall thickness. Right ventricular systolic function is mildly reduced. There is moderately elevated pulmonary artery systolic pressure. The tricuspid regurgitant velocity is 3.23 m/s, and with an assumed right atrial pressure of 8 mmHg, the estimated right ventricular systolic pressure is 49.7 mmHg. Left Atrium: Left atrial size was moderately dilated. Right Atrium: Right atrial size was severely dilated. Pericardium: There is no evidence of pericardial effusion. Mitral Valve: The mitral valve is normal in structure. No evidence of mitral valve regurgitation. Tricuspid Valve: The tricuspid valve is normal in structure. Tricuspid valve regurgitation is mild . No evidence of tricuspid stenosis. Aortic  Valve: The aortic valve is normal in structure. There is mild calcification of the aortic valve. Aortic valve regurgitation is not visualized. No aortic stenosis is present. Pulmonic Valve: The pulmonic valve was normal in structure. Pulmonic valve regurgitation is not visualized. No evidence of pulmonic stenosis. Aorta: The aortic root and ascending aorta are structurally normal, with no evidence of dilitation. IAS/Shunts: No atrial level shunt detected by color flow Doppler.  LEFT VENTRICLE PLAX 2D LVIDd:         7.80 cm      Diastology LVIDs:         7.50 cm      LV e' medial:    5.59 cm/s LV PW:         0.80 cm      LV E/e' medial:  19.0 LV IVS:        0.80 cm      LV e' lateral:   11.76 cm/s LVOT diam:     2.10 cm  LV E/e' lateral: 9.0 LV SV:         27 LV SV Index:   11 LVOT Area:     3.46 cm  LV Volumes (MOD) LV vol d, MOD A2C: 288.0 ml LV vol d, MOD A4C: 338.0 ml LV vol s, MOD A2C: 261.0 ml LV vol s, MOD A4C: 245.0 ml LV SV MOD A2C:     27.0 ml LV SV MOD A4C:     338.0 ml LV SV MOD BP:      60.9 ml RIGHT VENTRICLE RV Basal diam:  5.70 cm    PULMONARY VEINS RV S prime:     8.70 cm/s  Diastolic Velocity: 43.00 cm/s TAPSE (M-mode): 1.6 cm     S/D Velocity:       0.60                            Systolic Velocity:  24.30 cm/s LEFT ATRIUM              Index        RIGHT ATRIUM           Index LA diam:        4.90 cm  1.95 cm/m   RA Area:     33.80 cm LA Vol (A2C):   107.0 ml 42.49 ml/m  RA Volume:   126.00 ml 50.04 ml/m LA Vol (A4C):   105.0 ml 41.70 ml/m LA Biplane Vol: 106.0 ml 42.09 ml/m  AORTIC VALVE             PULMONIC VALVE LVOT Vmax:   58.37 cm/s  PV Vmax:       0.74 m/s LVOT Vmean:  40.067 cm/s PV Peak grad:  2.2 mmHg LVOT VTI:    0.079 m  AORTA Ao Root diam: 2.90 cm Ao Asc diam:  2.90 cm MITRAL VALVE                TRICUSPID VALVE MV Area (PHT): 1.43 cm     TR Peak grad:   41.7 mmHg MV Decel Time: 531 msec     TR Vmax:        323.00 cm/s MV E velocity: 106.12 cm/s                              SHUNTS                             Systemic VTI:  0.08 m                             Systemic Diam: 2.10 cm Stanly Leavens MD Electronically signed by Stanly Leavens MD Signature Date/Time: 05/04/2024/1:16:09 PM    Final    US  EKG SITE RITE Result Date: 05/04/2024 If Site Rite image not attached, placement could not be confirmed due to current cardiac rhythm.  US  ASCITES (ABDOMEN LIMITED) Result Date: 05/03/2024 CLINICAL DATA:  [id number] Ascites [id number] EXAM: LIMITED ABDOMEN ULTRASOUND FOR ASCITES TECHNIQUE: Limited ultrasound survey for ascites was performed in all four abdominal quadrants. COMPARISON:  None Available. FINDINGS: No ascites. IMPRESSION: No ascites. Electronically Signed   By: Rogelia Myers M.D.   On: 05/03/2024 17:36   DG Chest 2 View Result Date: 05/03/2024 EXAM: 2 VIEW(S) XRAY OF THE CHEST 05/03/2024 11:34:00  AM COMPARISON: 04/28/2024 CLINICAL HISTORY: sob FINDINGS: LINES, TUBES AND DEVICES: Left chest cardiac defibrillator noted. LUNGS AND PLEURA: No focal pulmonary opacity. No pulmonary edema. No pleural effusion. No pneumothorax. HEART AND MEDIASTINUM: Cardiomegaly. Lead from the left chest cardiac defibrillator terminates over the mediastinum. BONES AND SOFT TISSUES: No acute osseous abnormality. IMPRESSION: 1. No acute cardiopulmonary findings. 2. Stable enlargement of the cardiac silhouette. Electronically signed by: Waddell Calk MD 05/03/2024 01:13 PM EST RP Workstation: [id number]   DG Chest 2 View Result Date: 04/28/2024 EXAM: 2 VIEW(S) XRAY OF THE CHEST 04/28/2024 12:17:00 PM COMPARISON: 03/08/2024 CLINICAL HISTORY: shortness of breath. SOB,HEART FAILURE,DEFIBRILLATOR,HX COPD,ASTHMA.HTN,DM FINDINGS: LINES, TUBES AND DEVICES: Left chest wall AICD is unchanged in configuration. LUNGS AND PLEURA: No focal pulmonary opacity. No pulmonary edema. No pleural effusion. No pneumothorax. HEART AND MEDIASTINUM: Unchanged mild cardiomegaly. BONES AND SOFT TISSUES: No acute  osseous abnormality. IMPRESSION: 1. Unchanged mild cardiomegaly. 2. Unchanged left chest wall AICD. Electronically signed by: Aliene Lloyd MD 04/28/2024 01:04 PM EDT RP Workstation: [id number]   CUP PACEART REMOTE DEVICE CHECK Result Date: 04/21/2024 SICD Scheduled remote reviewed. Normal device function.  Presenting rhythm: VS 10 AF events in Oct., longest duration 20.7hrs, EGM's irregular, some PVC's, difficult to discern P waves - route to triage for review, no OAC per EPIC Next remote transmission per protocol. LA, CVRSSensing Configuration: Primary Gain Setting: 1X Post Shock Pacing: OFF   Microbiology: Results for orders placed or performed during the hospital encounter of 11/22/20  Resp Panel by RT-PCR (Flu A&B, Covid) Nasopharyngeal Swab     Status: None   Collection Time: 11/22/20  9:50 AM   Specimen: Nasopharyngeal Swab; Nasopharyngeal(NP) swabs in vial transport medium  Result Value Ref Range Status   SARS Coronavirus 2 by RT PCR NEGATIVE NEGATIVE Final    Comment: (NOTE) SARS-CoV-2 target nucleic acids are NOT DETECTED.  The SARS-CoV-2 RNA is generally detectable in upper respiratory specimens during the acute phase of infection. The lowest concentration of SARS-CoV-2 viral copies this assay can detect is 138 copies/mL. A negative result does not preclude SARS-Cov-2 infection and should not be used as the sole basis for treatment or other patient management decisions. A negative result may occur with  improper specimen collection/handling, submission of specimen other than nasopharyngeal swab, presence of viral mutation(s) within the areas targeted by this assay, and inadequate number of viral copies(<138 copies/mL). A negative result must be combined with clinical observations, patient history, and epidemiological information. The expected result is Negative.  Fact Sheet for Patients:  bloggercourse.com  Fact Sheet for Healthcare Providers:   seriousbroker.it  This test is no t yet approved or cleared by the [place]  FDA and  has been authorized for detection and/or diagnosis of SARS-CoV-2 by FDA under an Emergency Use Authorization (EUA). This EUA will remain  in effect (meaning this test can be used) for the duration of the COVID-19 declaration under Section 564(b)(1) of the Act, 21 U.S.C.section 360bbb-3(b)(1), unless the authorization is terminated  or revoked sooner.       Influenza A by PCR NEGATIVE NEGATIVE Final   Influenza B by PCR NEGATIVE NEGATIVE Final    Comment: (NOTE) The Xpert Xpress SARS-CoV-2/FLU/RSV plus assay is intended as an aid in the diagnosis of influenza from Nasopharyngeal swab specimens and should not be used as a sole basis for treatment. Nasal washings and aspirates are unacceptable for Xpert Xpress SARS-CoV-2/FLU/RSV testing.  Fact Sheet for Patients: bloggercourse.com  Fact Sheet for Healthcare Providers: seriousbroker.it  This  test is not yet approved or cleared by the [place]  FDA and has been authorized for detection and/or diagnosis of SARS-CoV-2 by FDA under an Emergency Use Authorization (EUA). This EUA will remain in effect (meaning this test can be used) for the duration of the COVID-19 declaration under Section 564(b)(1) of the Act, 21 U.S.C. section 360bbb-3(b)(1), unless the authorization is terminated or revoked.  Performed at [facility], [address] [place], [place] [address]    Labs: CBC: Recent Labs  Lab 05/08/24 0525 05/09/24 0356 05/10/24 0500 05/11/24 0428 05/12/24 0443  WBC 11.4* 10.6* 10.6* 9.7 11.5*  NEUTROABS  --   --  6.2 5.8 6.3  HGB 10.3* 11.0* 11.5* 11.6* 11.7*  HCT 32.3* 35.7* 35.9* 36.4* 36.1*  MCV 77.5* 78.1* 76.9* 76.5* 76.2*  PLT 309 344 341 330 303   Basic Metabolic Panel: Recent Labs  Lab 05/08/24 0525 05/09/24 0356 05/10/24 0500  05/11/24 0428 05/12/24 0443  NA 131* 132* 129* 127* 126*  K 3.7 4.1 3.8 4.0 3.5  CL 86* 88* 84* 83* 85*  CO2 34* 31 30 29 30   GLUCOSE 103* 114* 180* 120* 130*  BUN 19 22* 26* 32* 42*  CREATININE 1.63* 1.56* 1.74* 1.92* 1.93*  CALCIUM  8.6* 8.7* 8.9 9.1 8.5*  MG 2.3 2.1 1.9 2.3 2.3   Liver Function Tests: Recent Labs  Lab 05/10/24 0500 05/11/24 0428 05/12/24 0443  AST 65* 98* 73*  ALT 48* 67* 59*  ALKPHOS 67 65 67  BILITOT 1.1 0.8 0.6  PROT 7.3 7.9 7.2  ALBUMIN 3.1* 3.4* 3.3*   CBG: Recent Labs  Lab 05/11/24 1226 05/11/24 1649 05/11/24 2120 05/12/24 0615 05/12/24 1131  GLUCAP 115* 150* 115* 115* 118*    Discharge time spent: greater than 30 minutes.  Author: Yetta Blanch, MD  [facility] 05/12/2024

## 2024-05-12 NOTE — Progress Notes (Addendum)
 Notified Dr. Tobie that patient ambulated in the halls and began c/o feeling winded, dizzy, and nauseous, as well as his heart racing and his feet feeling numb and reports that he doesn't feel ready to go home today,

## 2024-05-12 NOTE — TOC Transition Note (Addendum)
 Transition of Care [facility]) - Discharge Note   Patient Details  Name: Kyle Pearson MRN: [id number] Date of Birth: [birth year]  Transition [facility] Care [facility] CM/SW Contact:  Waddell Barnie Rama, RN Phone Number: 05/12/2024, 2:22 PM   Clinical Narrative:    For dc today, he has transportation.  [employer] will follow up with him outpatient to set up the cpap machine.  Eliquis copay is 15.00 and entresto  is zero copay per [facility] pharmacy and they are ready.     Barriers to Discharge: Continued Medical Work up   Patient Goals and CMS Choice Patient states their goals for this hospitalization and ongoing recovery are:: wants to recover          Discharge Placement                       Discharge Plan and Services Additional resources added to the After Visit Summary for     Discharge Planning Services: CM Consult                                 Social Drivers of Health (SDOH) Interventions SDOH Screenings   Food Insecurity: No Food Insecurity (05/04/2024)  Housing: Low Risk  (05/04/2024)  Transportation Needs: No Transportation Needs (05/04/2024)  Utilities: Not At Risk (05/04/2024)  Tobacco Use: Medium Risk (05/03/2024)     Readmission Risk Interventions     No data to display

## 2024-05-12 NOTE — Progress Notes (Signed)
 Patient ID: Kyle Pearson, male   DOB: 24-Apr-1976, 48 y.o.   MRN: [id number]     Advanced Heart Failure Rounding Note  Cardiologist: None  Chief Complaint: Cardiogenic Shock Subjective:   11/10 TEE-guided DCCV-->SR.  11/11 DBA stopped.   Denies SOB. Had a hard time walking yesterday.  , Objective:    Weight Range: (!) 146.2 kg Body mass index is 52.02 kg/m.   Vital Signs:   Temp:  [97.8 F (36.6 C)-98.5 F (36.9 C)] 98.3 F (36.8 C) (11/12 0400) Pulse Rate:  [72-82] 77 (11/12 0401) Resp:  [18-20] 20 (11/12 0400) BP: (92-122)/(63-77) 102/74 (11/12 0401) SpO2:  [94 %-99 %] 96 % (11/12 0401) FiO2 (%):  [21 %] 21 % (11/12 0002) Weight:  [146.2 kg] 146.2 kg (11/12 0509) Last BM Date : 05/10/24  Weight change: Filed Weights   05/10/24 0500 05/11/24 0548 05/12/24 0509  Weight: (!) 147.2 kg (!) 144.6 kg (!) 146.2 kg   Intake/Output:  Intake/Output Summary (Last 24 hours) at 05/12/2024 0639 Last data filed at 05/12/2024 0421 Gross per 24 hour  Intake 2136.99 ml  Output 1275 ml  Net 861.99 ml   CVP 7 Physical Exam   General:   No resp difficulty Neck: JVP difficult to assess due to body habitus.  Cor: Regular rate & rhythm.  Lungs: clear Abdomen: soft, nontender, nondistended.  Extremities: no  edema Neuro: alert & oriented x3   Telemetry  SR 70-80s  Labs    CBC Recent Labs    05/11/24 0428 05/12/24 0443  WBC 9.7 11.5*  NEUTROABS 5.8 6.3  HGB 11.6* 11.7*  HCT 36.4* 36.1*  MCV 76.5* 76.2*  PLT 330 303   Basic Metabolic Panel Recent Labs    88/88/74 0428 05/12/24 0443  NA 127* 126*  K 4.0 3.5  CL 83* 85*  CO2 29 30  GLUCOSE 120* 130*  BUN 32* 42*  CREATININE 1.92* 1.93*  CALCIUM  9.1 8.5*  MG 2.3 2.3   Liver Function Tests Recent Labs    05/11/24 0428 05/12/24 0443  AST 98* 73*  ALT 67* 59*  ALKPHOS 65 67  BILITOT 0.8 0.6  PROT 7.9 7.2  ALBUMIN 3.4* 3.3*    BNP (last 3 results) Recent Labs    10/08/23 1006 03/08/24 1749  05/03/24 1104  BNP 181.5* 137.8* 836.5*   Medications:     Scheduled Medications:  apixaban  5 mg Oral BID   Chlorhexidine  Gluconate Cloth  6 each Topical Daily   digoxin   125 mcg Oral Daily   insulin aspart  0-15 Units Subcutaneous TID WC   insulin aspart  0-5 Units Subcutaneous QHS   melatonin  5 mg Oral QHS   multivitamin with minerals  1 tablet Oral Daily   polyethylene glycol  17 g Oral BID   sacubitril -valsartan   1 tablet Oral BID   senna-docusate  1 tablet Oral Daily   sodium chloride  flush  10-40 mL Intracatheter Q12H   sodium chloride  flush  3 mL Intravenous Q12H   spironolactone   50 mg Oral Daily   thiamine   100 mg Oral Daily    Infusions:  amiodarone 30 mg/hr (05/11/24 1902)    PRN Medications: acetaminophen  **OR** acetaminophen , bisacodyl , ondansetron  **OR** ondansetron  (ZOFRAN ) IV, oxyCODONE , sodium chloride  flush  Assessment/Plan   Cardiogenic Shock  Acute on chronic systolic CHF: Nonischemic cardiomyopathy. [employer] ICD. Symptomatic since 9/21 (after episode of peri-tonsillar abscess).  He drinks ETOH but does not seem to describe heavy enough ETOH to  cause profound cardiomyopathy (though he did have mild cirrhosis on abdominal US  at [facility]). No FH of cardiomyopathy. Echo 11/21 with EF < 20% with severe LV dilation, moderate RVE with severely decreased systolic function, severe biatrial enlargement, mild MR. [facility] 12/21 showed no coronary disease, markedly elevated filling pressures, and low cardiac output. Cardiac MRI in 11/21 showed severely dilated LV with diffuse hypokinesis, EF 17%, mod dilated RV with EF 20%, diffuse mid-wall LGE in septum, inferior wall & inferolateral wall.  LGE pattern suggests possible prior myocarditis. Echo in 3/22 showed that EF remains < 20% with mild RV dysfunction. CPX in 4/22 showed only a mild HF limitation.  Trypanosoma cruzi antibodies were negative (had h/o neurocysticercosis), probably not cardiomyopathy related to  trypanosomiasis. Echo 1/25 showed EF 20-25%, G3DD, RV mildly reduced. Repeat echo 11/4 with EF <20%, mildly reduced RV. - Admitted with NYHA IV symptoms, suspect HF exacerbation triggered by onset of atrial fibrillation. Compliance appears to be an issue. Had been off some medications. - CO-OX remains stable off dobutamine. - CVP 7 today. Start torsemide  40 mg daily. Kdur 40 meq twice a day.   -GDMT limited with low output, no BB.  - continue farxiga  10 mg daily - continue spironolactone  to 50 mg daily.   - continue digoxin  0.125 daily - continue Entresto  24-26 mg twice a day. Of note this was cut back yesterday due to soft SBP.  -Renal function stable.    Atrial flutter - new as of ICM remote monitoring 04/17/24; see on ECG by PCP - Continue Eliquis.  - TEE-guided DCCV 11/10, remains in NSR today.  - Maintaining SR. Stop amio drip. Start amio 400 mg daily x7 days then 200 mg daily      Anemia - PCP referred to GI due to concern for GIB - hgb stable today, now on Eliquis.  - Tsat 7; given IV Fe   ETOH:   - He did have early cirrhosis on abdominal US  at [facility].  However, with obesity could be NAFLD. He is cutting back his beer intake - Mildly elevated transaminases.  - Discussed cessation.   OSA: Moderate by sleep study 08/2020. AHI 31.1/hr - Needs to wear CPAP   Obesity:  -  on Mounjaro as OP, resume at d/c    SDOH:  - He is insured and has transportation. - Farxiga  $45/90 dayEntresto  $45/90 (can use co-pay cards for each)   Needs to walk today. If ok should be able to go home.  Amio 400 mg daily x 7 days  then 200 mg daily  Eliqiuis 5 mg twice a day  Farxiga  10 mg daily Spironolactone  to 50 mg daily.   Digoxin  0.125 daily  Entresto  24-26 mg twice a day  HF follow up has been set up.   Length of Stay: 9  Chelan Heringer, NP  05/12/2024, 6:39 AM  Advanced Heart Failure Team Pager [phone] (M-F; 7a - 5p)  Please contact [facility] for night-coverage after hours (5p  -7a ) and weekends on [email or web address]

## 2024-05-12 NOTE — Plan of Care (Signed)
   Problem: Coping: Goal: Ability to adjust to condition or change in health will improve Outcome: Progressing

## 2024-05-12 NOTE — Progress Notes (Signed)
 PT placed on CPAP at this time.   05/12/24 0002  BiPAP/CPAP/SIPAP  BiPAP/CPAP/SIPAP Pt Type Adult  BiPAP/CPAP/SIPAP Resmed  Mask Type Full face mask  Mask Size Large  Set Rate 0 breaths/min  Respiratory Rate 15 breaths/min  FiO2 (%) 21 %  Flow Rate 0 lpm  Minute Ventilation 15.1  Leak 0  Peak Inspiratory Pressure (PIP) 13  Tidal Volume (Vt) 491  Patient Home Machine No  Patient Home Mask No  Patient Home Tubing No  Auto Titrate Yes  Minimum cmH2O 6 cmH2O  Maximum cmH2O 12 cmH2O  Nasal massage performed Yes  CPAP/SIPAP surface wiped down Yes  Device Plugged into RED Power Outlet Yes

## 2024-05-13 ENCOUNTER — Other Ambulatory Visit: Payer: Self-pay | Admitting: Physician Assistant

## 2024-05-13 ENCOUNTER — Telehealth: Payer: Self-pay | Admitting: *Deleted

## 2024-05-13 NOTE — Transitions of Care (Post Inpatient/ED Visit) (Signed)
 05/13/2024  Name: Kyle Pearson MRN: [id number] DOB: 1975-07-26  Today's TOC FU Call Status: Today's TOC FU Call Status:: Successful TOC FU Call Completed TOC FU Call Complete Date: 05/13/24  Patient's Name and Date of Birth confirmed. Name, DOB  Transition Care Management Follow-up Telephone Call Date of Discharge: 05/12/24 Discharge Facility: [facility]) Type of Discharge: Inpatient Admission Primary Inpatient Discharge Diagnosis:: Acute on chronic systolic (congestive) heart failure How have you been since you were released from the hospital?: Better (eating, drinking well,  no issues with bowel/ bladder, ambulating without difficulty) Any questions or concerns?: No  Items Reviewed: Did you receive and understand the discharge instructions provided?: Yes Medications obtained,verified, and reconciled?: Yes (Medications Reviewed) Any new allergies since your discharge?: No Dietary orders reviewed?: Yes Type of Diet Ordered:: cardiac diet Do you have support at home?: Yes People in Home [RPT]:  (pt does not specify who is in the home with him) Name of Support/Comfort Primary Source: pt states he has support, does not share the name  Medications Reviewed Today: Medications Reviewed Today     Reviewed by Aura Mliss LABOR, RN (Registered Nurse) on 05/13/24 at 1023  Med List Status: <None>   Medication Order Taking? Sig Documenting Provider Last Dose Status Informant  amiodarone (PACERONE) 200 MG tablet [id number] Yes Take 2 tablets (400 mg total) by mouth daily for 7 days, THEN 1 tablet (200 mg total) daily. Tobie Yetta HERO, MD  Active   apixaban WINN) 5 MG TABS tablet [id number] Yes Take 1 tablet (5 mg total) by mouth 2 (two) times daily. Tobie Yetta HERO, MD  Active   bisacodyl  (DULCOLAX) 5 MG EC tablet [id number] Yes Take 5 mg by mouth daily as needed for mild constipation. [provider]  Active Self, Pharmacy Records  Cholecalciferol (VITAMIN D3) 50 MCG  (2000 UT) capsule [id number] Yes Take 1 capsule (2,000 Units total) by mouth daily. Kayla Jeoffrey RAMAN, FNP  Active Self, Pharmacy Records  digoxin  (LANOXIN ) 0.125 MG tablet [id number] Yes Take 1 tablet (125 mcg total) by mouth daily. NEEDS FOLLOW UP APPOINTMENT FOR MORE REFILLS Lelon Glendia DASEN, PA-C  Active Self, Pharmacy Records  FARXIGA  10 MG TABS tablet [id number] Yes Take 1 tablet (10 mg total) by mouth daily before breakfast. Lelon Glendia DASEN, PA-C  Active Self, Pharmacy Records  Iron, Ferrous Sulfate, 325 (65 Fe) MG TABS [id number] Yes Take 325 mg by mouth every other day. Kayla Jeoffrey RAMAN, FNP  Active Self, Pharmacy Records  potassium chloride  SA (KLOR-CON  M20) 20 MEQ tablet [id number] Yes Take 3 tablets (60 mEq total) by mouth daily.  Patient taking differently: Take 40 mEq by mouth daily.   [place], Harlene HERO, FNP  Active Self, Pharmacy Records  sacubitril -valsartan  (ENTRESTO ) 24-26 MG [id number] Yes Take 1 tablet by mouth 2 (two) times daily. Tobie Yetta HERO, MD  Active   spironolactone  (ALDACTONE ) 50 MG tablet [id number] Yes Take 1 tablet (50 mg total) by mouth daily. Tobie Yetta HERO, MD  Active   thiamine  (VITAMIN B1) 100 MG tablet [id number] Yes Take 1 tablet (100 mg total) by mouth daily. Tobie Yetta HERO, MD  Active   tirzepatide [facility]) 2.5 MG/0.5ML Pen [id number]  Inject 2.5 mg into the skin once a week.  Patient not taking: Reported on 05/13/2024   Kayla Jeoffrey RAMAN, FNP  Active Self, Pharmacy Records  torsemide  (DEMADEX ) 20 MG tablet [id number] Yes Take 2 tablets (40 mg total) by mouth daily. Tobie Yetta HERO, MD  Active  Home Care and Equipment/Supplies: Were Home Health Services Ordered?: No Any new equipment or medical supplies ordered?: Yes Name of Medical supply agency?: [employer]  for CPAP,  pt states he has contact # for [employer] and will call Were you able to get the equipment/medical supplies?: No (pt will call, states he is able to do this) Do you have any questions  related to the use of the equipment/supplies?: No  Functional Questionnaire: Do you need assistance with bathing/showering or dressing?: No Do you need assistance with meal preparation?: No Do you need assistance with eating?: No Do you have difficulty maintaining continence: No Do you need assistance with getting out of bed/getting out of a chair/moving?: No Do you have difficulty managing or taking your medications?: No  Follow up appointments reviewed: PCP Follow-up appointment confirmed?: Yes Date of PCP follow-up appointment?: 05/19/24 Follow-up Provider: Jeoffrey Barrio FNP Specialist Hospital Follow-up appointment confirmed?: Yes Date of Specialist follow-up appointment?: 05/24/24 Follow-Up Specialty Provider:: [facility] @ 2 pm Do you need transportation to your follow-up appointment?: No Do you understand care options if your condition(s) worsen?: Yes-patient verbalized understanding  SDOH Interventions Today    Flowsheet Row Most Recent Value  SDOH Interventions   Food Insecurity Interventions Intervention Not Indicated  Housing Interventions Intervention Not Indicated  Transportation Interventions Intervention Not Indicated  Utilities Interventions Intervention Not Indicated    Goals Addressed             This Visit's Progress    VBCI Transitions of Care (TOC) Care Plan       Problems:  Recent Hospitalization for treatment of CHF Equipment/DME barrier [employer] will be providing CPAP, pt states he has contact # and is able to call and follow up with [employer] within a few days if does not hear from them and Knowledge Deficit Related to CHF  Patient reports he needs a new scale and will be purchasing scale and blood pressure cuff today, will start weighing and checking blood pressure.  Pt reports he has dizziness on occasion, at times when I stand up too quickly,  denies any dizziness today.  Goal:  Over the next 30 days, the patient will not experience  hospital readmission  Interventions:  Heart Failure Interventions: Basic overview and discussion of pathophysiology of Heart Failure reviewed Provided education on low sodium diet Assessed need for readable accurate scales in home Provided education about placing scale on hard, flat surface Advised patient to weigh each morning after emptying bladder Discussed importance of daily weight and advised patient to weigh and record daily Reviewed role of diuretics in prevention of fluid overload and management of heart failure; Discussed the importance of keeping all appointments with provider Screening for signs and symptoms of depression related to chronic disease state  Assessed social determinant of health barriers  Reviewed importance of checking daily weight and blood pressure and keeping a log Reviewed HF action plan Instructed pt to rise slowly from bed, chair and do not ambulate if dizzy  Patient Self Care Activities:  Attend all scheduled provider appointments Call pharmacy for medication refills 3-7 days in advance of running out of medications Call provider office for new concerns or questions  Notify RN Care Manager of TOC call rescheduling needs Participate in Transition of Care Program/Attend TOC scheduled calls Perform IADL's (shopping, preparing meals, housekeeping, managing finances) independently Take medications as prescribed   call office if I gain more than 2 pounds in one day or 5 pounds in one week  keep legs up while sitting track weight in diary use salt in moderation watch for swelling in feet, ankles and legs every day weigh myself daily develop a rescue plan follow rescue plan if symptoms flare-up eat more whole grains, fruits and vegetables, lean meats and healthy fats Please begin weighing and checking blood pressure daily and keeping a log Rise slowly from your bed and chair to help prevent dizziness  Plan:  Telephone follow up appointment with care  management team member scheduled for:  05/20/24 @ 11 am The patient has been provided with contact information for the care management team and has been advised to call with any health related questions or concerns.         Mliss Creed [facility], BSN RN Care Manager/ Transition of Care [place]/ [facility] [phone]

## 2024-05-14 NOTE — Telephone Encounter (Signed)
 This is a CHF pt

## 2024-05-17 ENCOUNTER — Inpatient Hospital Stay: Admitting: Family Medicine

## 2024-05-19 ENCOUNTER — Ambulatory Visit (INDEPENDENT_AMBULATORY_CARE_PROVIDER_SITE_OTHER): Admitting: Family Medicine

## 2024-05-19 ENCOUNTER — Encounter: Payer: Self-pay | Admitting: Family Medicine

## 2024-05-19 ENCOUNTER — Ambulatory Visit: Admitting: Family Medicine

## 2024-05-19 VITALS — BP 102/64 | HR 91 | Temp 97.7°F | Ht 66.0 in | Wt 318.4 lb

## 2024-05-19 DIAGNOSIS — Z79899 Other long term (current) drug therapy: Secondary | ICD-10-CM

## 2024-05-19 DIAGNOSIS — I5022 Chronic systolic (congestive) heart failure: Secondary | ICD-10-CM

## 2024-05-19 DIAGNOSIS — D649 Anemia, unspecified: Secondary | ICD-10-CM

## 2024-05-19 DIAGNOSIS — E1165 Type 2 diabetes mellitus with hyperglycemia: Secondary | ICD-10-CM

## 2024-05-19 MED ORDER — AMIODARONE HCL 200 MG PO TABS: 200.0000 mg | ORAL_TABLET | Freq: Every day | ORAL | Status: DC

## 2024-05-19 MED ORDER — TIRZEPATIDE 2.5 MG/0.5ML ~~LOC~~ SOAJ: 2.5000 mg | SUBCUTANEOUS | 1 refills | Status: AC

## 2024-05-19 MED ORDER — TORSEMIDE 20 MG PO TABS: 40.0000 mg | ORAL_TABLET | Freq: Every day | ORAL | 0 refills | Status: DC

## 2024-05-19 NOTE — Progress Notes (Signed)
 Acute Office Visit  Patient ID: Trace Wirick, male    DOB: March 01, 1976, 48 y.o.   MRN: [id number]  PCP: Kayla Jeoffrey RAMAN, FNP  Chief Complaint  Patient presents with   Hospitalization Follow-up    05/03/24 SOB would like to discuss reducing medications     Subjective:     HPI  Mr Jubilee is here today for HFU. Was recently hospitalized from 05/03/2024-05/12/2024 for CHF exacerbation. See HPI below. Follow-up with Cardiology and DME for CPAP scheduled. Will need CMP and CBC today.   HPI 05/12/2024 for reference only: Hospital Course: 47/M, morbidly obese with chronic systolic CHF/NICM, paroxysmal A-fib, EtOH abuse, liver cirrhosis, OSA, presented to GI office 11/3 to discuss colonoscopy, he was noted to be significantly volume overloaded and  in atrial flutter with RVR, sent to the ED, questionable compliance, and still does not have his CPAP. Advanced heart failure service was consulted.  Patient was started on dobutamine.  Midodrine was transition off of this medication. Cardiology recommended the patient can be discharged from their point of view on 11/12.   Assessment and Plan: Acute on chronic systolic CHF, NICM Cardiogenic shock, low output Last echo 1/25 noted EF 20-25, grade 3 DD, mildly reduced RV, LHC 12/21 noted no CAD Significantly volume overloaded on admission this time, concern for compliance with medications Heart failure team was consulted.Started dobutamine 11/5,  was diuresing well on Lasix  gtt., Toprol  discontinued due to low output. Continue Aldactone  Farxiga  and digoxin  Sodium and LFT worsening.  Cardiology felt patient is stable from volume point of view.  Recommend discharge home.   Atrial flutter with RVR - Recently anticoagulation held due to concern for GI bleed - Currently on heparin  gtt. and Amio gtt. Underwent cardioversion on 11/10.  Monitor.   Severe iron deficiency anemia Given, monitor for GI bleed on anticoagulation-on IV heparin  Anemia  panel with severe iron deficiency Given IV iron X2 doses H&H stable.  On Eliquis.   Liver cirrhosis EtOH abuse - Ultrasound now with no ascites - Diuretics as noted above, EtOH cessation counseled - Thiamine , multivitamin, monitor for withdrawal Referred to GI.   Moderate OSA - Reports that his CPAP is yet to be delivered but last sleep study was in 2022 - Order CPAP nightly, TOC consult for home CPAP, may need a new sleep study   Morbid obesity, BMI is 55.5 - On Mounjaro now   Constipation Now on laxatives, monitor   Diffuse nerve pain. Patient reports that he has diffuse numbness and tingling all over his body. Also has some pain in his legs and arm as well as around his jaw which feels like nerve pain. Etiology not clear. Vitamin levels have been adequate. No focal deficit. For now we will monitor.   Myoclonic jerks. Patient reports that he has myoclonic jerks. Given lactulose.  For now hold.  Ammonia level negative. No focal deficit. ABG and VBG reassuring.   Hyponatremia and LFT elevation. In setting of heart failure. Close outpatient follow-up recommended. Recommended fluid restriction upon discharge.   Consultants:  Cardiology  Discussed the use of AI scribe software for clinical note transcription with the patient, who gave verbal consent to proceed.  History of Present Illness Neiman Roots is a 48 year old male with heart failure and atrial flutter who presents with shortness of breath and medication management post-hospitalization.  He was recently hospitalized and discharged on November 12th due to shortness of breath experienced while walking across a parking lot. During  his hospital stay, he underwent a transesophageal echocardiogram (TEE) and a cardioversion procedure to address atrial flutter. He was treated for fluid overload, which has since improved, although he continues to experience shortness of breath with physical activity.  He has not  yet received his CPAP machine, which is pending delivery. He reports poor sleep, averaging about three hours at a time, and attributes this to his sleep apnea. He also mentions feeling fatigued and lacking energy, which limits his ability to perform physical activities for more than 20 minutes at a time.  He is following a no-salt, no-sodium diet and is trying to manage his weight. He has an upcoming appointment with a nurse to address his CPAP and medication management. He also has scheduled follow-up appointments with cardiology and pulmonology on November 24th.  He experiences fatigue and limited exercise tolerance. Has not started Mounjaro . Was unable to complete endoscopy due to CHF exacerbation, needs to follow up with GI.   Review of Systems  All other systems reviewed and are negative.   Past Medical History:  Diagnosis Date   Atrial flutter (HCC)    CHF (congestive heart failure) (HCC)    OSA (obstructive sleep apnea)    Pacemaker    Pulmonary hypertensive venous disease (HCC)    Type 2 diabetes mellitus with hyperglycemia, without long-term current use of insulin  [facility])     Past Surgical History:  Procedure Laterality Date   CARDIOVERSION N/A 05/10/2024   Procedure: CARDIOVERSION;  Surgeon: Rolan Ezra RAMAN, MD;  Location: [facility] INVASIVE CV LAB;  Service: Cardiovascular;  Laterality: N/A;   RIGHT HEART CATH N/A 12/08/2020   Procedure: RIGHT HEART CATH;  Surgeon: Rolan Ezra RAMAN, MD;  Location: [facility] INVASIVE CV LAB;  Service: Cardiovascular;  Laterality: N/A;   RIGHT/LEFT HEART CATH AND CORONARY ANGIOGRAPHY N/A 06/01/2020   Procedure: RIGHT/LEFT HEART CATH AND CORONARY ANGIOGRAPHY;  Surgeon: Rolan Ezra RAMAN, MD;  Location: [facility] INVASIVE CV LAB;  Service: Cardiovascular;  Laterality: N/A;   SUBQ ICD IMPLANT N/A 01/26/2021   Procedure: SUBQ ICD IMPLANT;  Surgeon: Inocencio Soyla Lunger, MD;  Location: [facility] INVASIVE CV LAB;  Service: Cardiovascular;  Laterality: N/A;   TRANSESOPHAGEAL  ECHOCARDIOGRAM (CATH LAB) N/A 05/10/2024   Procedure: TRANSESOPHAGEAL ECHOCARDIOGRAM;  Surgeon: Rolan Ezra RAMAN, MD;  Location: [name] INVASIVE CV LAB;  Service: Cardiovascular;  Laterality: N/A;    Current Outpatient Medications on File Prior to Visit  Medication Sig Dispense Refill   apixaban  (ELIQUIS ) 5 MG TABS tablet Take 1 tablet (5 mg total) by mouth 2 (two) times daily. 60 tablet 0   bisacodyl  (DULCOLAX) 5 MG EC tablet Take 5 mg by mouth daily as needed for mild constipation.     Cholecalciferol (VITAMIN D3) 50 MCG (2000 UT) capsule Take 1 capsule (2,000 Units total) by mouth daily. 90 capsule 0   digoxin  (LANOXIN ) 0.125 MG tablet Take 1 tablet (125 mcg total) by mouth daily. 90 tablet 0   FARXIGA  10 MG TABS tablet Take 1 tablet (10 mg total) by mouth daily before breakfast. 90 tablet 3   Iron , Ferrous Sulfate , 325 (65 Fe) MG TABS Take 325 mg by mouth every other day. 30 tablet 1   potassium chloride  SA (KLOR-CON  M20) 20 MEQ tablet Take 3 tablets (60 mEq total) by mouth daily. (Patient taking differently: Take 40 mEq by mouth daily.) 270 tablet 3   sacubitril -valsartan  (ENTRESTO ) 24-26 MG Take 1 tablet by mouth 2 (two) times daily. 60 tablet 0   spironolactone  (ALDACTONE ) 50 MG  tablet Take 1 tablet (50 mg total) by mouth daily. 30 tablet 0   thiamine  (VITAMIN B1) 100 MG tablet Take 1 tablet (100 mg total) by mouth daily. 30 tablet 0   No current facility-administered medications on file prior to visit.    Allergies  Allergen Reactions   Raspberry Anaphylaxis   Penicillins Rash    Reaction: Childhood       Objective:    BP 102/64   Pulse 91   Temp 97.7 F (36.5 C)   Ht 5' 6 (1.676 m)   Wt (!) 318 lb 6.4 oz (144.4 kg)   SpO2 93%   BMI 51.39 kg/m    Physical Exam Vitals and nursing note reviewed.  Constitutional:      Appearance: Normal appearance. He is obese.  HENT:     Head: Normocephalic and atraumatic.  Cardiovascular:     Rate and Rhythm: Normal rate and  regular rhythm.     Pulses: Normal pulses.     Heart sounds: Normal heart sounds.  Pulmonary:     Effort: Pulmonary effort is normal.     Breath sounds: Normal breath sounds.  Skin:    General: Skin is warm and dry.     Capillary Refill: Capillary refill takes less than 2 seconds.  Neurological:     General: No focal deficit present.     Mental Status: He is alert and oriented to person, place, and time. Mental status is at baseline.  Psychiatric:        Mood and Affect: Mood normal.        Behavior: Behavior normal.        Thought Content: Thought content normal.        Judgment: Judgment normal.       No results found for any visits on 05/19/24.     Assessment & Plan:   Problem List Items Addressed This Visit     CHF (congestive heart failure) (HCC) - Primary   Relevant Medications   amiodarone (PACERONE) 200 MG tablet   torsemide  (DEMADEX ) 20 MG tablet   Other Relevant Orders   CBC with Differential/Platelet   Comprehensive metabolic panel with GFR   Anemia   Relevant Orders   CBC with Differential/Platelet   Comprehensive metabolic panel with GFR   Type 2 diabetes mellitus with hyperglycemia, without long-term current use of insulin (HCC)   Relevant Medications   tirzepatide (MOUNJARO) 2.5 MG/0.5ML Pen   Other Relevant Orders   CBC with Differential/Platelet   Comprehensive metabolic panel with GFR   Other Visit Diagnoses       Medication management       Relevant Orders   CBC with Differential/Platelet   Comprehensive metabolic panel with GFR       Assessment and Plan Assessment & Plan Chronic systolic congestive heart failure Recent exacerbation due to fluid overload. Shortness of breath with exertion indicates reduced cardiac output. Medications adjusted during hospitalization. - Continue current heart failure medications including torsemide  and sacubitril -valsartan . - Refilled torsemide  prescription at [facility] on [address]. - Scheduled follow-up  with cardiology on November 24th. - Encouraged low sodium diet, daily weights, fluid restriction  Atrial flutter, status post cardioversion Currently in normal sinus rhythm post successful cardioversion. - Continue amiodarone 200 mg daily. - Continue Eliquis twice daily.  Type 2 diabetes mellitus with hyperglycemia Recent A1c of 6.6. Discussed Tirzepatide benefits for weight loss, heart health, and diabetes management. Potential side effects include nausea and stomach upset. - Instructed to monitor  for side effects such as nausea or stomach upset. - Encouraged high protein, low carbohydrate diet. - Scheduled follow-up in one month to assess response to Tirzepatide.  Obstructive sleep apnea Pending CPAP setup. Reports poor sleep quality with frequent awakenings. - Proceed with CPAP setup as scheduled.  Anemia Reports constipation with iron tablets. - Continue iron supplementation every other day. - Consider stool softeners if constipation persists.  Vitamin D deficiency - Continue vitamin D supplementation.    Meds ordered this encounter  Medications   amiodarone (PACERONE) 200 MG tablet    Sig: Take 1 tablet (200 mg total) by mouth daily.    Supervising Provider:   DUANNE LOWERS T [[id number]]   torsemide  (DEMADEX ) 20 MG tablet    Sig: Take 2 tablets (40 mg total) by mouth daily.    Dispense:  60 tablet    Refill:  0    Supervising Provider:   DUANNE LOWERS T [[id number]]   tirzepatide (MOUNJARO) 2.5 MG/0.5ML Pen    Sig: Inject 2.5 mg into the skin once a week.    Dispense:  2 mL    Refill:  1    Supervising Provider:   DUANNE LOWERS T [[id number]]    Return in about 4 weeks (around 06/16/2024) for follow-up.  Jeoffrey GORMAN Barrio, FNP [facility]

## 2024-05-20 ENCOUNTER — Telehealth: Payer: Self-pay | Admitting: *Deleted

## 2024-05-20 ENCOUNTER — Ambulatory Visit: Payer: Self-pay | Admitting: Family Medicine

## 2024-05-20 ENCOUNTER — Encounter: Payer: Self-pay | Admitting: *Deleted

## 2024-05-20 LAB — COMPREHENSIVE METABOLIC PANEL WITH GFR
AG Ratio: 1.3 (calc) (ref 1.0–2.5)
ALT: 38 U/L (ref 9–46)
AST: 29 U/L (ref 10–40)
Albumin: 4.3 g/dL (ref 3.6–5.1)
Alkaline phosphatase (APISO): 59 U/L (ref 36–130)
BUN: 15 mg/dL (ref 7–25)
CO2: 26 mmol/L (ref 20–32)
Calcium: 9.6 mg/dL (ref 8.6–10.3)
Chloride: 99 mmol/L (ref 98–110)
Creat: 1 mg/dL (ref 0.60–1.29)
Globulin: 3.3 g/dL (ref 1.9–3.7)
Glucose, Bld: 95 mg/dL (ref 65–99)
Potassium: 4.6 mmol/L (ref 3.5–5.3)
Sodium: 133 mmol/L — ABNORMAL LOW (ref 135–146)
Total Bilirubin: 1.2 mg/dL (ref 0.2–1.2)
Total Protein: 7.6 g/dL (ref 6.1–8.1)
eGFR: 93 mL/min/1.73m2 (ref >=60)

## 2024-05-20 LAB — CBC WITH DIFFERENTIAL/PLATELET
Absolute Lymphocytes: 2568 {cells}/uL (ref 850–3900)
Absolute Monocytes: 813 {cells}/uL (ref 200–950)
Basophils Absolute: 118 {cells}/uL (ref 0–200)
Basophils Relative: 1.2 %
Eosinophils Absolute: 862 {cells}/uL — ABNORMAL HIGH (ref 15–500)
Eosinophils Relative: 8.8 %
HCT: 39.6 % (ref 38.5–50.0)
Hemoglobin: 12.3 g/dL — ABNORMAL LOW (ref 13.2–17.1)
MCH: 25.2 pg — ABNORMAL LOW (ref 27.0–33.0)
MCHC: 31.1 g/dL — ABNORMAL LOW (ref 32.0–36.0)
MCV: 81.1 fL (ref 80.0–100.0)
MPV: 9.9 fL (ref 7.5–12.5)
Monocytes Relative: 8.3 %
Neutro Abs: 5439 {cells}/uL (ref 1500–7800)
Neutrophils Relative %: 55.5 %
Platelets: 371 Thousand/uL (ref 140–400)
RBC: 4.88 Million/uL (ref 4.20–5.80)
RDW: 19.2 % — ABNORMAL HIGH (ref 11.0–15.0)
Total Lymphocyte: 26.2 %
WBC: 9.8 Thousand/uL (ref 3.8–10.8)

## 2024-05-21 ENCOUNTER — Telehealth: Payer: Self-pay | Admitting: *Deleted

## 2024-05-21 ENCOUNTER — Encounter: Payer: Self-pay | Admitting: *Deleted

## 2024-05-21 ENCOUNTER — Telehealth (HOSPITAL_COMMUNITY): Payer: Self-pay

## 2024-05-21 NOTE — Patient Instructions (Signed)
 Visit Information  Thank you for taking time to visit with me today. Please don't hesitate to contact me if I can be of assistance to you before our next scheduled telephone appointment.  Following are the goals we discussed today:   Goals Addressed             This Visit's Progress    [facility] Transitions of Care (TOC) Care Plan       Problems:  Recent Hospitalization for treatment of CHF Equipment/DME barrier [employer] will be providing CPAP, pt states he has contact # and is able to call and follow up with [employer] within a few days if does not hear from them and Knowledge Deficit Related to CHF  Patient reports he needs a new scale and will be purchasing scale and blood pressure cuff today, will start weighing and checking blood pressure.  Pt reports he has dizziness on occasion, at times when I stand up too quickly,  denies any dizziness today. 05/21/24- spoke with pt who reports he has not heard anything about CPAP, not sure if anyone tried to call him and maybe he did not answer, (RN CM to check on)  pt did get a scale and is weighing daily, weight has decreased to 309 pounds  Goal:  Over the next 30 days, the patient will not experience hospital readmission  Interventions:  Heart Failure Interventions: Assessed need for readable accurate scales in home Provided education about placing scale on hard, flat surface Advised patient to weigh each morning after emptying bladder Discussed importance of daily weight and advised patient to weigh and record daily Reviewed role of diuretics in prevention of fluid overload and management of heart failure; Discussed the importance of keeping all appointments with provider Reinforced importance of checking daily weight and blood pressure and keeping a log Reinforced HF action plan Reviewed energy conservation Reinforced with pt to rise slowly from bed, chair and do not ambulate if dizzy Reviewed plan of care with pt including next outreach will  be 06/04/24, will not call week of Thanksgiving, pt is agreeable to this plan  Patient Self Care Activities:  Attend all scheduled provider appointments Call pharmacy for medication refills 3-7 days in advance of running out of medications Call provider office for new concerns or questions  Notify RN Care Manager of TOC call rescheduling needs Participate in Transition of Care Program/Attend TOC scheduled calls Perform IADL's (shopping, preparing meals, housekeeping, managing finances) independently Take medications as prescribed   call office if I gain more than 2 pounds in one day or 5 pounds in one week keep legs up while sitting track weight in diary use salt in moderation watch for swelling in feet, ankles and legs every day weigh myself daily develop a rescue plan follow rescue plan if symptoms flare-up eat more whole grains, fruits and vegetables, lean meats and healthy fats Please begin weighing and checking blood pressure daily and keeping a log Rise slowly from your bed and chair to help prevent dizziness RN care manager will check with [employer] about CPAP and get back with you  Plan:  Telephone outreach - 06/04/24 @ 930 am         Our next appointment is by telephone on 06/04/24 @ 930 am  Please call the care guide team at [phone] if you need to cancel or reschedule your appointment.   If you are experiencing a Mental Health or Behavioral Health Crisis or need someone to talk to, please call the Suicide  and Crisis Lifeline: 988 call the [place]  National Suicide Prevention Lifeline: 870-132-6036 or TTY: 501 867 5251 TTY 405-155-5734) to talk to a trained counselor call 1-800-273-TALK (toll free, 24 hour hotline) go to [facility] [address] [place] [phone]) call 911   Care plan and visit instructions communicated with the patient verbally today. Patient agrees to receive a copy in MyChart. Active MyChart status and  patient understanding of how to access instructions and care plan via MyChart confirmed with patient.     Telephone follow up appointment with care management team member scheduled for:  06/04/24 @ 930 am  Mliss Creed [facility], BSN RN Care Manager/ Transition of Care [place]/ [facility] [phone]

## 2024-05-21 NOTE — Telephone Encounter (Signed)
 Called to confirm/remind patient of their appointment at the [facility] on 05/24/2024.   Appointment:   [] Confirmed  [x] Left mess   [] No answer/No voice mail  [] VM Full/unable to leave message  [] Phone not in service  Patient reminded to bring all medications and/or complete list.  Confirmed patient has transportation. Gave directions, instructed to utilize valet parking.

## 2024-05-21 NOTE — Patient Outreach (Signed)
 05/21/2024  Patient ID: Kyle Pearson, male   DOB: [birth year], 48 y.o.   MRN: [id number]  Spoke with Holley at [facility] who reports CPAP is in process and pending insurance approval, once they get approval, pt will be contacted and Pam feels it will hopefully only be a few more days. Telephone call to patient to inform of the above information, no answer to telephone, left voicemail letting pt know CPAP is in approval process, gave contact # to [employer] [phone] for pt to also follow up if needed.  Mliss Creed [facility], BSN RN Care Manager/ Transition of Care [place]/ [facility] [phone]

## 2024-05-24 ENCOUNTER — Encounter (HOSPITAL_COMMUNITY): Payer: Self-pay

## 2024-05-24 ENCOUNTER — Ambulatory Visit (HOSPITAL_COMMUNITY)
Admit: 2024-05-24 | Discharge: 2024-05-24 | Disposition: A | Discharge status: Home / Self Care | Attending: Family Medicine | Admitting: Family Medicine

## 2024-05-24 ENCOUNTER — Ambulatory Visit

## 2024-05-24 VITALS — BP 120/95 | HR 85 | Ht 66.0 in | Wt 317.0 lb

## 2024-05-24 DIAGNOSIS — Z79899 Other long term (current) drug therapy: Secondary | ICD-10-CM | POA: Insufficient documentation

## 2024-05-24 DIAGNOSIS — F1010 Alcohol abuse, uncomplicated: Secondary | ICD-10-CM | POA: Diagnosis not present

## 2024-05-24 DIAGNOSIS — I517 Cardiomegaly: Secondary | ICD-10-CM | POA: Insufficient documentation

## 2024-05-24 DIAGNOSIS — Z7901 Long term (current) use of anticoagulants: Secondary | ICD-10-CM | POA: Insufficient documentation

## 2024-05-24 DIAGNOSIS — Z8679 Personal history of other diseases of the circulatory system: Secondary | ICD-10-CM | POA: Diagnosis not present

## 2024-05-24 DIAGNOSIS — R1900 Intra-abdominal and pelvic swelling, mass and lump, unspecified site: Secondary | ICD-10-CM | POA: Diagnosis not present

## 2024-05-24 DIAGNOSIS — Z87891 Personal history of nicotine dependence: Secondary | ICD-10-CM | POA: Diagnosis not present

## 2024-05-24 DIAGNOSIS — Z95810 Presence of automatic (implantable) cardiac defibrillator: Secondary | ICD-10-CM | POA: Insufficient documentation

## 2024-05-24 DIAGNOSIS — G4733 Obstructive sleep apnea (adult) (pediatric): Secondary | ICD-10-CM | POA: Insufficient documentation

## 2024-05-24 DIAGNOSIS — R059 Cough, unspecified: Secondary | ICD-10-CM | POA: Diagnosis not present

## 2024-05-24 DIAGNOSIS — Z7984 Long term (current) use of oral hypoglycemic drugs: Secondary | ICD-10-CM | POA: Insufficient documentation

## 2024-05-24 DIAGNOSIS — Z6841 Body Mass Index (BMI) 40.0 and over, adult: Secondary | ICD-10-CM | POA: Insufficient documentation

## 2024-05-24 DIAGNOSIS — D649 Anemia, unspecified: Secondary | ICD-10-CM | POA: Insufficient documentation

## 2024-05-24 DIAGNOSIS — K7460 Unspecified cirrhosis of liver: Secondary | ICD-10-CM | POA: Diagnosis not present

## 2024-05-24 DIAGNOSIS — I5022 Chronic systolic (congestive) heart failure: Secondary | ICD-10-CM | POA: Diagnosis not present

## 2024-05-24 DIAGNOSIS — R062 Wheezing: Secondary | ICD-10-CM | POA: Diagnosis not present

## 2024-05-24 DIAGNOSIS — Z139 Encounter for screening, unspecified: Secondary | ICD-10-CM

## 2024-05-24 DIAGNOSIS — J36 Peritonsillar abscess: Secondary | ICD-10-CM | POA: Insufficient documentation

## 2024-05-24 DIAGNOSIS — I440 Atrioventricular block, first degree: Secondary | ICD-10-CM | POA: Diagnosis not present

## 2024-05-24 DIAGNOSIS — I519 Heart disease, unspecified: Secondary | ICD-10-CM | POA: Diagnosis not present

## 2024-05-24 DIAGNOSIS — I4892 Unspecified atrial flutter: Secondary | ICD-10-CM | POA: Diagnosis not present

## 2024-05-24 DIAGNOSIS — I428 Other cardiomyopathies: Secondary | ICD-10-CM | POA: Diagnosis not present

## 2024-05-24 DIAGNOSIS — E8770 Fluid overload, unspecified: Secondary | ICD-10-CM | POA: Insufficient documentation

## 2024-05-24 LAB — CBC
HCT: 39.7 % (ref 39.0–52.0)
Hemoglobin: 12.4 g/dL — ABNORMAL LOW (ref 13.0–17.0)
MCH: 25.3 pg — ABNORMAL LOW (ref 26.0–34.0)
MCHC: 31.2 g/dL (ref 30.0–36.0)
MCV: 81 fL (ref 80.0–100.0)
Platelets: 298 K/uL (ref 150–400)
RBC: 4.9 MIL/uL (ref 4.22–5.81)
RDW: 21.9 % — ABNORMAL HIGH (ref 11.5–15.5)
WBC: 8.6 K/uL (ref 4.0–10.5)
nRBC: 0 % (ref 0.0–0.2)

## 2024-05-24 LAB — BASIC METABOLIC PANEL WITH GFR
Anion gap: 9 (ref 5–15)
BUN: 11 mg/dL (ref 6–20)
CO2: 26 mmol/L (ref 22–32)
Calcium: 9.3 mg/dL (ref 8.9–10.3)
Chloride: 103 mmol/L (ref 98–111)
Creatinine, Ser: 1.1 mg/dL (ref 0.61–1.24)
GFR, Estimated: >60 mL/min (ref >60)
Glucose, Bld: 100 mg/dL — ABNORMAL HIGH (ref 70–99)
Potassium: 4.3 mmol/L (ref 3.5–5.1)
Sodium: 138 mmol/L (ref 135–145)

## 2024-05-24 LAB — BRAIN NATRIURETIC PEPTIDE: B Natriuretic Peptide: 403.8 pg/mL — ABNORMAL HIGH (ref 0.0–100.0)

## 2024-05-24 LAB — DIGOXIN LEVEL: Digoxin Level: <0.6 ng/mL — ABNORMAL LOW (ref 0.8–2.0)

## 2024-05-24 MED ORDER — TORSEMIDE 20 MG PO TABS: 60.0000 mg | ORAL_TABLET | Freq: Every day | ORAL | 3 refills | Status: DC

## 2024-05-24 MED ORDER — POTASSIUM CHLORIDE CRYS ER 20 MEQ PO TBCR: 60.0000 meq | EXTENDED_RELEASE_TABLET | Freq: Every day | ORAL | 3 refills | Status: DC

## 2024-05-24 NOTE — Progress Notes (Signed)
 Advanced Heart Failure Clinic Note  PCP: [facility] HF Cardiologist: Dr. Rolan  HPI: Kyle Pearson is a 48 y.o. with history of tobacco and alcohol use, diagnosed with CHF in 10/21.    Prior to 11/21, no past medical history. He used to smoke less than 1/2 pack per day and drank around a 6 pack of beer on the weekend.  He denies family history of heart disease but reported his father used to drink alcohol heavily but quit as well.  He states in 03/16/2020 he started to develop a cough and didn't want to mix medication with alcohol so he stopped drinking and started to take cough medication.  He noticed one day a very severe pain on his left side and pain with swallowing.  He was diagnosed with peritonsillar abscess and placed on clindamycin  (300 mg po QID x 10 days) and decadron .  He reports feeling better throat wise but noticed increase swelling to his abdomen and difficulty breathing.  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  He continued to experience trouble breathing and walking short distances were difficulty.  He went to [facility] on 04/24/2020 for shortness of breath, abdominal distention and left sided chest pain. 2D echo showed EF 15-20% with severe LVH, BNP 562 and troponin 54.  He was started on lisinopril  5 mg, toprol -XL 25 mg PO daily and lasix  IV.  He also had an abdominal ultrasound which showed no significant asites but note mild liver cirrhosis with mild gallbladder wall thickening. He was discharged home on 40 mg PO lasix , KCL 20 mEq, lisinopril  5 and toprol -XL 25.     He presented to 1st PCP appt on 05/29/20 and was found to have shortness of breath, bilaterally lower extremity edema and abdominal distention.  Reporting 30lb weight gain despite compliance with medication. He was sent to [facility] , where his BNP was [facility] BNP 562), CXR showed cardiomegaly only. Repeat echo revealed EF  < 20%, severe decrease function with global hypokinesis, grade III DD, dilated LA/RA and small pericardial effusion. He was admitted 05/29/20-06/06/20 with marked volume overload. Diuresed with IV lasix  and set for cath. Cath showed elevated filling pressures and low output, no significant CAD. Milrinone  was added and he continued to diurese on IV. Milrinone  later weaned off with stable co-ox. Started on GDMT. Had cMRI with severely reduced EF and non-coronary pattern extensive scar. Due to concern for ectopy from scar, he was discharged with Lifevest.  Diuresed 35 lbs. Discharge weight 255.6 lbs.  Echo 3/22 showed that EF remains < 20%, moderate LVE, moderate RVE, mildly decreased RV systolic function, IVC dilated.  CPX in 4/22 showed only a mild HF limitation.   Admitted 11/2020 with volume overload.   [employer] ICD was placed.   He had an episode of VT vs 1:1 atrial flutter in 11/23, had been out of meds x 3 days at the time.    Echo 1/25 showed EF 20-25%, G3DD, RV mildly reduced.  Seen in ED 03/08/24 with CP. Out of several meds x 3 weeks. ECG and HsTroponin reassuring.  Admitted 11/25 with a/c HF and AF. Diuresed with DBA support. Started on amiodarone  gtt and underwent DCCV to NSR. Drips weaned, GDMT titrated and he was discharged home, weight 309 lbs.  Today he returns for post hospital HF follow up. Overall feeling fine. He is wheezing and coughing.  He feels he has fluid on board. He has SOB walking further distances on flat ground. He has chest pressure when he coughs. Abdomen swelling. Denies palpitations, abnormal bleeding, dizziness, or PND/Orthopnea. Appetite ok. Weight at home 317-319 pounds. Takes torsemide  20 mg daily, discharged on 60 mg daily.  ReDs reading: 34%, normal  ECG (personally reviewed): NSR with 1AVB  Labs (6/22): Trypanosoma cruzi antibody test negative Labs (3/24): K 3.8, creatinine 0.78 Labs (11/24): K 4.0, creatinine 0.89 Labs (1/25): K 4.0, creatinine  0.69 Labs (9/25): K 4.1, creatinine 0.85 Labs (11/25): K 4.6, creatinine 1.0, normal LFTs  PMH: 1. Prior smoker. 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  [employer] ICD.  HIV negative.  No strong family history. Prior heavy ETOH but quit in 9/21.   - Echo (11/21): EF < 20%, severe LV dilation, severe RVE and severely decreased RV systolic function, severe biatrial enlargement.  - Cardiac MRI (11/21): Severely dilated LV with diffuse hypokinesis, EF 17%., mod dilated RV with EF 20%, diffuse mid-wall LGE in septum, inferior wall & inferolateral wall - LHC/RHC (12/21): No significant CAD; mean RA 29, PA 69/32 mean 49, mean PCWP 43, CI 2.1 (Fick) and 1.7 (thermo), PVR 1.22 - Echo (3/22): EF < 20%, moderate LVE, moderate RVE, mildly decreased RV systolic function, IVC dilated.  - CPX (4/22): Peak VO2 20.2, VE/VCO2 slope 33, RER 1.07.  Mild HF limitation.  - Echo (1/25): EF 20-25%, G3DD, RV mildly reduced - Echo (11/25): EF <20%, RV mildly reduced 4. OSA 5. VT: 3/24, 11/24.  6. AFL: TEE-guided DCCV to NSR (11/25), TEE showed LVEF < 20%, RV moderately reduced, no thrombus, no PFO or ASD, moderate to severe MR, suspect functional    ROS: All systems negative except as listed in HPI, PMH and Problem List.  SH:  Social History   Socioeconomic History   Marital status: Married    Spouse name: Not on file   Number of children: Not on file   Years of education: Not on file   Highest education level: Not on file  Occupational History   Not on file  Tobacco Use   Smoking status: Former    Current packs/day: 0.00    Types: Cigarettes    Quit date: 2020    Years since quitting: 5.9   Smokeless tobacco: Never  Vaping Use   Vaping status: Never Used  Substance and Sexual Activity   Alcohol use: Not Currently   Drug use: Not Currently   Sexual activity: Not Currently  Other Topics Concern   Not on file  Social History Narrative   Lives with  spouse   Social Drivers of Health   Financial Resource Strain: Not on file  Food Insecurity: No Food Insecurity (05/13/2024)   Hunger Vital Sign    Worried About Running Out of Food in the Last Year: Never true    Ran Out of Food in the Last Year: Never true  Transportation Needs: No Transportation Needs (05/13/2024)   PRAPARE - Administrator, Civil Service (Medical): No    Lack of Transportation (Non-Medical): No  Physical Activity: Not on file  Stress: Not on file  Social Connections: Not on file  Intimate Partner Violence: Not At Risk (05/13/2024)   Humiliation, Afraid, Rape, and Kick questionnaire    Fear of Current or Ex-Partner: No    Emotionally Abused: No    Physically Abused: No    Sexually Abused: No   FH:  Family History  Problem Relation Age of Onset   Diabetes Mother    Stroke Father    Diabetes Sister    Current Outpatient Medications  Medication Sig Dispense Refill   amiodarone  (PACERONE ) 200 MG tablet Take 1 tablet (200 mg total) by mouth daily.     apixaban  (ELIQUIS ) 5 MG TABS tablet Take 1 tablet (5 mg total) by mouth 2 (two) times daily. 60 tablet 0   bisacodyl  (DULCOLAX) 5 MG EC tablet Take 5 mg by mouth daily as needed for mild constipation.     Cholecalciferol (VITAMIN D3) 50 MCG (2000 UT) capsule Take 1 capsule (2,000 Units total) by mouth daily. 90 capsule 0   digoxin  (LANOXIN ) 0.125 MG tablet Take 1 tablet (125 mcg total) by mouth daily. 90 tablet 0   FARXIGA  10 MG TABS tablet Take 1 tablet (10 mg total) by mouth daily before breakfast. 90 tablet 3   Iron , Ferrous Sulfate , 325 (65 Fe) MG TABS Take 325 mg by mouth every other day. 30 tablet 1   potassium chloride  SA (KLOR-CON  M20) 20 MEQ tablet Take 3 tablets (60 mEq total) by mouth daily. (Patient taking differently: Take 40 mEq by mouth daily.) 270 tablet 3   thiamine  (VITAMIN B1) 100 MG tablet Take 1 tablet (100 mg total) by mouth daily. 30 tablet 0   tirzepatide  (MOUNJARO ) 2.5 MG/0.5ML  Pen Inject 2.5 mg into the skin once a week. 2 mL 1   torsemide  (DEMADEX ) 20 MG tablet Take 2 tablets (40 mg total) by mouth daily. (Patient taking differently: Take 20 mg by mouth daily.) 60 tablet 0   sacubitril -valsartan  (ENTRESTO ) 24-26 MG Take 1 tablet by mouth 2 (two) times daily. (Patient not taking: Reported on 05/24/2024) 60 tablet 0   spironolactone  (ALDACTONE ) 50 MG tablet Take 1 tablet (50 mg total) by mouth daily. (Patient not taking: Reported on 05/24/2024) 30 tablet 0   No current facility-administered medications for this encounter.   BP (!) 120/95   Pulse 85   Ht 5' 6 (1.676 m)   Wt (!) 143.8 kg (317 lb)   SpO2 96%   BMI 51.17 kg/m   Wt Readings from Last 3 Encounters:  05/24/24 (!) 143.8 kg (317 lb)  05/21/24 (!) 140.2 kg (309 lb)  05/19/24 (!) 144.4 kg (318 lb 6.4 oz)   PHYSICAL EXAM: General:  NAD. No resp difficulty, walked into clinic HEENT: Normal Neck: Supple. No JVD. Thick neck Cor: Regular rate & rhythm. No rubs, gallops or murmurs. Lungs: Clear Abdomen: Soft, obese, nontender, nondistended.  Extremities: No cyanosis, clubbing, rash, trace BLE edema Neuro: Alert & oriented x 3, moves all 4 extremities w/o difficulty. Affect pleasant.  ASSESSMENT & PLAN: 1. Chronic systolic CHF: Nonischemic cardiomyopathy. [employer] ICD. Symptomatic since 9/21 (after episode of peri-tonsillar abscess).  He drinks ETOH but does not seem to describe heavy enough ETOH to cause profound cardiomyopathy (though he did have mild cirrhosis on abdominal US  at [facility]). No FH of cardiomyopathy. Echo 11/21 with EF < 20% with severe LV dilation, moderate RVE with severely decreased systolic function, severe biatrial enlargement, mild MR. [facility] 12/21 showed no coronary disease, markedly elevated filling pressures, and low cardiac output. Cardiac MRI in 11/21 showed severely dilated LV with diffuse hypokinesis, EF 17%, mod dilated RV with EF 20%, diffuse mid-wall LGE in septum, inferior  wall & inferolateral wall.  LGE pattern suggests possible prior myocarditis. Echo in 3/22 showed that EF remains < 20% with mild RV dysfunction. CPX in 4/22 showed  only a mild HF limitation.  Trypanosoma cruzi antibodies were negative (had h/o neurocysticercosis), probably not cardiomyopathy related to trypanosomiasis. Echo 1/25 showed EF 20-25%, G3DD, RV mildly reduced. Echo this admission 11/25, with EF <20%, mildly reduced RV. NYHA IIb today, he is mildly volume overloaded on exam. - Increase torsemide  back to 60 mg daily, increase KCL to 60 daily. BMET/BNP today, repeat BMET in 10-14 days - Continue Farxiga  10 mg daily. - Continue spironolactone  50 mg daily.   - Continue digoxin  0.125 mg daily. Check digoxin  level today. - Continue Entresto  24/26 mg bid. - No BB yet with recent low output. - Refer to CR. - Repeat echo in 3 months.  2. Atrial flutter: new as of ICM remote monitoring 04/17/24; seen on ECG by PCP. Underwent TEE guided DCCV 05/10/24 to NSR. Remains in NSR on ECG today. - Continue amiodarone  200 mg daily. Follow LFTs and TSH. He will need a regular eye exam while on amiodarone . - Continue Eliquis  5 mg bid. No bleeding issues. CBC today. - Will need referral to EP for ablation discussions.  3. Anemia: he received IV iron  during recent admission. - PCP referred to GI due to concern for GIB.   4. ETOH:  He did have early cirrhosis on abdominal US  at [facility].  However, with obesity could be NAFLD. He is cutting back his beer intake - Discussed cessation.   5. OSA: Moderate by sleep study 08/2020. AHI 31.1/hr - Needs to wear CPAP, awaiting delivery from company.   6. Morbid Obesity: Body mass index is 51.17 kg/m. - Continue Mounjaro .   7. SDOH: He is insured and has transportation. - Farxiga  $45/90 dayEntresto  $45/90 (can use co-pay cards for each)   Follow up in 2-3 weeks with APP.   Harlene Gainer, FNP-BC 05/24/24

## 2024-05-24 NOTE — Patient Instructions (Signed)
 Medication Changes:  INCREASE TORSEMIDE  TO 60MG  ONCE DAILY   INCREASE POTASSIUM ONCE DAILY   Lab Work:  Labs done today, your results will be available in MyChart, we will contact you for abnormal readings.  Referrals:  YOU HAVE BEEN REFERRED TO CARDIAC REHAB THEY WILL REACH OUT TO YOU OR CALL TO ARRANGE THIS. PLEASE CALL US  WITH ANY CONCERNS   Follow-Up in: 2-3 WEEKS AS SCHEDULED   At the Advanced Heart Failure Clinic, you and your health needs are our priority. We have a designated team specialized in the treatment of Heart Failure. This Care Team includes your primary Heart Failure Specialized Cardiologist (physician), Advanced Practice Providers (APPs- Physician Assistants and Nurse Practitioners), and Pharmacist who all work together to provide you with the care you need, when you need it.   You may see any of the following providers on your designated Care Team at your next follow up:  Dr. Toribio Fuel Dr. Ezra Shuck Dr. Odis Brownie Greig Mosses, NP Caffie Shed, [place] [facility] [place], [place] Beckey Coe, NP Jordan Lee, NP Tinnie Redman, PharmD   Please be sure to bring in all your medications bottles to every appointment.   Need to Contact Us :  If you have any questions or concerns before your next appointment please send us  a message through [place] or call our office at [phone].    TO LEAVE A MESSAGE FOR THE NURSE SELECT OPTION 2, PLEASE LEAVE A MESSAGE INCLUDING: YOUR NAME DATE OF BIRTH CALL BACK NUMBER REASON FOR CALL**this is important as we prioritize the call backs  YOU WILL RECEIVE A CALL BACK THE SAME DAY AS LONG AS YOU CALL BEFORE 4:00 PM

## 2024-05-24 NOTE — Progress Notes (Signed)
 ReDS Vest / Clip - 05/24/24 1403       ReDS Vest / Clip   Station Marker D    Ruler Value 42    ReDS Value Range Low volume    ReDS Actual Value 34

## 2024-05-25 ENCOUNTER — Ambulatory Visit: Admitting: Family Medicine

## 2024-05-25 ENCOUNTER — Encounter (HOSPITAL_COMMUNITY): Payer: Self-pay

## 2024-05-25 ENCOUNTER — Ambulatory Visit (HOSPITAL_COMMUNITY): Payer: Self-pay | Admitting: Family Medicine

## 2024-06-01 ENCOUNTER — Telehealth (HOSPITAL_COMMUNITY): Payer: Self-pay

## 2024-06-01 NOTE — Telephone Encounter (Signed)
 Attempted to schedule cardiac rehab- no answer, left message. Sent MyChart message.

## 2024-06-02 ENCOUNTER — Ambulatory Visit (HOSPITAL_BASED_OUTPATIENT_CLINIC_OR_DEPARTMENT_OTHER): Admitting: Family Medicine

## 2024-06-04 ENCOUNTER — Other Ambulatory Visit: Payer: Self-pay | Admitting: *Deleted

## 2024-06-04 NOTE — Patient Instructions (Signed)
 Visit Information  Thank you for taking time to visit with me today. Please don't hesitate to contact me if I can be of assistance to you before our next scheduled telephone appointment.  Our next appointment is by telephone on 06/11/24 @ 1030 am  Following is a copy of your care plan:   Goals Addressed             This Visit's Progress    VBCI Transitions of Care (TOC) Care Plan       Problems:  Recent Hospitalization for treatment of CHF Equipment/DME barrier [employer] will be providing CPAP, pt states he has contact # and is able to call and follow up with [employer] within a few days if does not hear from them and Knowledge Deficit Related to CHF  Patient reports he needs a new scale and will be purchasing scale and blood pressure cuff today, will start weighing and checking blood pressure.  Pt reports he has dizziness on occasion, at times when I stand up too quickly,  denies any dizziness today. 05/21/24- spoke with pt who reports he has not heard anything about CPAP, not sure if anyone tried to call him and maybe he did not answer, (RN CM to check on)  pt did get a scale and is weighing daily, weight has decreased to 309 pounds 06/04/24-spoke with pt who reports he did talk with DME company about CPAP and they are still awaiting on insurance approval,  pt is weighing daily  Goal:  Over the next 30 days, the patient will not experience hospital readmission  Interventions:  Heart Failure Interventions: Reviewed role of diuretics in prevention of fluid overload and management of heart failure; Discussed the importance of keeping all appointments with provider Reviewed importance of checking daily weight and blood pressure and keeping a log Reviewed HF action plan Reviewed energy conservation Reinforced with pt to rise slowly from bed, chair and do not ambulate if dizzy  Patient Self Care Activities:  Attend all scheduled provider appointments Call pharmacy for medication refills 3-7  days in advance of running out of medications Call provider office for new concerns or questions  Notify RN Care Manager of TOC call rescheduling needs Participate in Transition of Care Program/Attend TOC scheduled calls Perform IADL's (shopping, preparing meals, housekeeping, managing finances) independently Take medications as prescribed   call office if I gain more than 2 pounds in one day or 5 pounds in one week keep legs up while sitting track weight in diary use salt in moderation watch for swelling in feet, ankles and legs every day weigh myself daily develop a rescue plan follow rescue plan if symptoms flare-up eat more whole grains, fruits and vegetables, lean meats and healthy fats Please begin weighing and checking blood pressure daily and keeping a log Rise slowly from your bed and chair to help prevent dizziness Continue weighing daily and keeping a log  Plan:  Telephone outreach - 06/11/24 @ 1030 am        Care plan and visit instructions communicated with the patient verbally today. Patient agrees to receive a copy in MyChart. Active MyChart status and patient understanding of how to access instructions and care plan via MyChart confirmed with patient.     Telephone follow up appointment with care management team member scheduled for: 06/11/24 @ 1030 am  Please call the care guide team at [phone] if you need to cancel or reschedule your appointment.   Please call the Suicide and Crisis Lifeline:  988 call the [place]  National Suicide Prevention Lifeline: (847) 700-4572 or TTY: 8122588910 TTY 325-103-3944) to talk to a trained counselor call 1-800-273-TALK (toll free, 24 hour hotline) go to [facility] [address], [place] [phone]) call 911 if you are experiencing a Mental Health or Behavioral Health Crisis or need someone to talk to.  Mliss Creed [facility], BSN RN Care Manager/ Transition of Care [place]/ [facility] [phone]

## 2024-06-04 NOTE — Patient Outreach (Signed)
 Transition of Care week 3  Visit Note  06/04/2024  Name: Kyle Pearson MRN: [id number]          DOB: May 23, 1976  Situation: Patient enrolled in [facility] 30-day program. Visit completed with patent by telephone.   Background:  Discharge Date and Diagnosis: 05/12/24, Acute on chronic systolic (congestive) heart failure   Past Medical History:  Diagnosis Date   Atrial flutter (HCC)    CHF (congestive heart failure) (HCC)    OSA (obstructive sleep apnea)    Pacemaker    Pulmonary hypertensive venous disease (HCC)    Type 2 diabetes mellitus with hyperglycemia, without long-term current use of insulin  (HCC)     Assessment: Patient Reported Symptoms: Cognitive Cognitive Status: No symptoms reported, Alert and oriented to person, place, and time, Able to follow simple commands, Normal speech and language skills      Neurological Neurological Review of Symptoms: No symptoms reported    HEENT HEENT Symptoms Reported: No symptoms reported      Cardiovascular Cardiovascular Symptoms Reported: No symptoms reported Does patient have uncontrolled Hypertension?: No Weight: (!) 304 lb (137.9 kg) Cardiovascular Self-Management Outcome: 4 (good) Cardiovascular Comment: pt denies edema,  reinforced HF action plan, pt is weighing daily  Respiratory Respiratory Symptoms Reported: No symptoms reported Other Respiratory Symptoms: denies dyspnea Respiratory Management Strategies: Adequate rest Respiratory Self-Management Outcome: 4 (good)  Endocrine Endocrine Symptoms Reported: No symptoms reported Is patient diabetic?: No    Gastrointestinal Gastrointestinal Symptoms Reported: No symptoms reported      Genitourinary Genitourinary Symptoms Reported: No symptoms reported    Integumentary Integumentary Symptoms Reported: No symptoms reported    Musculoskeletal Musculoskelatal Symptoms Reviewed: No symptoms reported        Psychosocial Psychosocial Symptoms Reported: No symptoms  reported         Today's Vitals   06/04/24 0936  Weight: (!) 304 lb (137.9 kg)   Pain Scale: 0-10 Pain Score: 0-No pain  Medications Reviewed Today     Reviewed by Aura Mliss LABOR, RN (Registered Nurse) on 06/04/24 at 0930  Med List Status: <None>   Medication Order Taking? Sig Documenting Provider Last Dose Status Informant  amiodarone  (PACERONE ) 200 MG tablet [id number]  Take 1 tablet (200 mg total) by mouth daily. Kayla Gauze S, FNP  Active   apixaban  (ELIQUIS ) 5 MG TABS tablet [id number]  Take 1 tablet (5 mg total) by mouth 2 (two) times daily. Tobie Yetta HERO, MD  Active   bisacodyl  (DULCOLAX) 5 MG EC tablet [id number]  Take 5 mg by mouth daily as needed for mild constipation. [provider]  Active Self, Pharmacy Records  Cholecalciferol (VITAMIN D3) 50 MCG (2000 UT) capsule [id number]  Take 1 capsule (2,000 Units total) by mouth daily. Kayla Gauze RAMAN, FNP  Active Self, Pharmacy Records  digoxin  (LANOXIN ) 0.125 MG tablet [id number]  Take 1 tablet (125 mcg total) by mouth daily. [place], [place], [place]  Active   FARXIGA  10 MG TABS tablet [id number]  Take 1 tablet (10 mg total) by mouth daily before breakfast. Lelon Glendia DASEN, PA-C  Active Self, Pharmacy Records  Iron , Ferrous Sulfate , 325 (65 Fe) MG TABS [id number]  Take 325 mg by mouth every other day. Kayla Gauze RAMAN, FNP  Active Self, Pharmacy Records  potassium chloride  SA (KLOR-CON  M20) 20 MEQ tablet [id number]  Take 3 tablets (60 mEq total) by mouth daily. Glena Harlene HERO, [place]  Active   sacubitril -valsartan  (ENTRESTO ) 24-26 MG [id number]  Take 1 tablet by mouth 2 (  two) times daily. Tobie Yetta HERO, MD  Active   spironolactone  (ALDACTONE ) 50 MG tablet [id number]  Take 1 tablet (50 mg total) by mouth daily. Tobie Yetta HERO, MD  Active   thiamine  (VITAMIN B1) 100 MG tablet [id number]  Take 1 tablet (100 mg total) by mouth daily. Tobie Yetta HERO, MD  Active   tirzepatide  (MOUNJARO ) 2.5 MG/0.5ML Pen [id number]  Inject 2.5 mg  into the skin once a week. Kayla Gauze S, FNP  Active   torsemide  (DEMADEX ) 20 MG tablet [id number]  Take 3 tablets (60 mg total) by mouth daily. [place], Harlene HERO, FNP  Active             Goals Addressed             This Visit's Progress    VBCI Transitions of Care (TOC) Care Plan       Problems:  Recent Hospitalization for treatment of CHF Equipment/DME barrier [employer] will be providing CPAP, pt states he has contact # and is able to call and follow up with [employer] within a few days if does not hear from them and Knowledge Deficit Related to CHF  Patient reports he needs a new scale and will be purchasing scale and blood pressure cuff today, will start weighing and checking blood pressure.  Pt reports he has dizziness on occasion, at times when I stand up too quickly,  denies any dizziness today. 05/21/24- spoke with pt who reports he has not heard anything about CPAP, not sure if anyone tried to call him and maybe he did not answer, (RN CM to check on)  pt did get a scale and is weighing daily, weight has decreased to 309 pounds 06/04/24-spoke with pt who reports he did talk with DME company about CPAP and they are still awaiting on insurance approval,  pt is weighing daily  Goal:  Over the next 30 days, the patient will not experience hospital readmission  Interventions:  Heart Failure Interventions: Reviewed role of diuretics in prevention of fluid overload and management of heart failure; Discussed the importance of keeping all appointments with provider Reviewed importance of checking daily weight and blood pressure and keeping a log Reviewed HF action plan Reviewed energy conservation Reinforced with pt to rise slowly from bed, chair and do not ambulate if dizzy  Patient Self Care Activities:  Attend all scheduled provider appointments Call pharmacy for medication refills 3-7 days in advance of running out of medications Call provider office for new concerns or questions   Notify RN Care Manager of TOC call rescheduling needs Participate in Transition of Care Program/Attend TOC scheduled calls Perform IADL's (shopping, preparing meals, housekeeping, managing finances) independently Take medications as prescribed   call office if I gain more than 2 pounds in one day or 5 pounds in one week keep legs up while sitting track weight in diary use salt in moderation watch for swelling in feet, ankles and legs every day weigh myself daily develop a rescue plan follow rescue plan if symptoms flare-up eat more whole grains, fruits and vegetables, lean meats and healthy fats Please begin weighing and checking blood pressure daily and keeping a log Rise slowly from your bed and chair to help prevent dizziness Continue weighing daily and keeping a log  Plan:  Telephone outreach - 06/11/24 @ 1030 am        Recommendation:   PCP Follow-up  Follow Up Plan:   Telephone follow-up 06/11/24 @ 1030 am  Mliss Creed RNC,  BSN RN Care Manager/ Transition of Care [place]/ [facility] [phone]

## 2024-06-09 NOTE — Progress Notes (Signed)
 Advanced Heart Failure Clinic Note  PCP: [facility] HF Cardiologist: Dr. Rolan  HPI: Kyle Pearson is a 48 y.o. with history of tobacco and alcohol use, diagnosed with CHF in 10/21.    Prior to 11/21, no past medical history. He used to smoke less than 1/2 pack per day and drank around a 6 pack of beer on the weekend.  He denies family history of heart disease but reported his father used to drink alcohol heavily but quit as well.  He states in 03/16/2020 he started to develop a cough and didn't want to mix medication with alcohol so he stopped drinking and started to take cough medication.  He noticed one day a very severe pain on his left side and pain with swallowing.  He was diagnosed with peritonsillar abscess and placed on clindamycin  (300 mg po QID x 10 days) and decadron .  He reports feeling better throat wise but noticed increase swelling to his abdomen and difficulty breathing.  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  He continued to experience trouble breathing and walking short distances were difficulty.  He went to [facility] on 04/24/2020 for shortness of breath, abdominal distention and left sided chest pain. 2D echo showed EF 15-20% with severe LVH, BNP 562 and troponin 54.  He was started on lisinopril  5 mg, toprol -XL 25 mg PO daily and lasix  IV.  He also had an abdominal ultrasound which showed no significant asites but note mild liver cirrhosis with mild gallbladder wall thickening. He was discharged home on 40 mg PO lasix , KCL 20 mEq, lisinopril  5 and toprol -XL 25.     He presented to 1st PCP appt on 05/29/20 and was found to have shortness of breath, bilaterally lower extremity edema and abdominal distention.  Reporting 30lb weight gain despite compliance with medication. He was sent to [facility] , where his BNP was [facility] BNP 562), CXR showed cardiomegaly only. Repeat echo revealed EF  < 20%, severe decrease function with global hypokinesis, grade III DD, dilated LA/RA and small pericardial effusion. He was admitted 05/29/20-06/06/20 with marked volume overload. Diuresed with IV lasix  and set for cath. Cath showed elevated filling pressures and low output, no significant CAD. Milrinone  was added and he continued to diurese on IV. Milrinone  later weaned off with stable co-ox. Started on GDMT. Had cMRI with severely reduced EF and non-coronary pattern extensive scar. Due to concern for ectopy from scar, he was discharged with Lifevest.  Diuresed 35 lbs. Discharge weight 255.6 lbs.  Echo 3/22 showed that EF remains < 20%, moderate LVE, moderate RVE, mildly decreased RV systolic function, IVC dilated.  CPX in 4/22 showed only a mild HF limitation.   Admitted 11/2020 with volume overload.   [employer] ICD was placed.   He had an episode of VT vs 1:1 atrial flutter in 11/23, had been out of meds x 3 days at the time.    Echo 1/25 showed EF 20-25%, G3DD, RV mildly reduced.  Seen in ED 03/08/24 with CP. Out of several meds x 3 weeks. ECG and HsTroponin reassuring.  Admitted 11/25 with a/c HF and AF. Diuresed with DBA support. Started on amiodarone  gtt and underwent DCCV to NSR. Drips weaned, GDMT titrated and he was discharged home, weight 309 lbs.  Today he returns for HF follow up. Overall feeling fine. He is not SOB walking up steps.  He feels chest pressure when he over-exerts himself. Denies palpitations, abnormal bleeding, CP, dizziness, edema, or PND/Orthopnea. Appetite ok. Weight at home 295 pounds. Taking all medications. Awaiting CPAP.  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   ReDs reading: 26%, normal  ECG (personally reviewed): none ordered today.  Labs (6/22): Trypanosoma cruzi antibody test negative Labs (3/24): K 3.8, creatinine 0.78 Labs (11/24): K 4.0, creatinine 0.89 Labs (1/25): K 4.0, creatinine 0.69 Labs (9/25): K 4.1, creatinine 0.85 Labs (11/25): K 4.3,  creatinine 1.1, normal LFTs  PMH: 1. Prior smoker. 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  [employer] ICD.  HIV negative.  No strong family history. Prior heavy ETOH but quit in 9/21.   - Echo (11/21): EF < 20%, severe LV dilation, severe RVE and severely decreased RV systolic function, severe biatrial enlargement.  - Cardiac MRI (11/21): Severely dilated LV with diffuse hypokinesis, EF 17%., mod dilated RV with EF 20%, diffuse mid-wall LGE in septum, inferior wall & inferolateral wall - LHC/RHC (12/21): No significant CAD; mean RA 29, PA 69/32 mean 49, mean PCWP 43, CI 2.1 (Fick) and 1.7 (thermo), PVR 1.22 - Echo (3/22): EF < 20%, moderate LVE, moderate RVE, mildly decreased RV systolic function, IVC dilated.  - CPX (4/22): Peak VO2 20.2, VE/VCO2 slope 33, RER 1.07.  Mild HF limitation.  - Echo (1/25): EF 20-25%, G3DD, RV mildly reduced - Echo (11/25): EF <20%, RV mildly reduced 4. OSA 5. VT: 3/24, 11/24.  6. AFL: TEE-guided DCCV to NSR (11/25), TEE showed LVEF < 20%, RV moderately reduced, no thrombus, no PFO or ASD, moderate to severe MR, suspect functional    ROS: All systems negative except as listed in HPI, PMH and Problem List.  SH:  Social History   Socioeconomic History   Marital status: Married    Spouse name: Not on file   Number of children: Not on file   Years of education: Not on file   Highest education level: Not on file  Occupational History   Not on file  Tobacco Use   Smoking status: Former    Current packs/day: 0.00    Types: Cigarettes    Quit date: 2020    Years since quitting: 5.9   Smokeless tobacco: Never  Vaping Use   Vaping status: Never Used  Substance and Sexual Activity   Alcohol use: Not Currently   Drug use: Not Currently   Sexual activity: Not Currently  Other Topics Concern   Not on file  Social History Narrative   Lives with spouse   Social Drivers of Health   Tobacco Use: Medium Risk  (06/14/2024)   Patient History    Smoking Tobacco Use: Former    Smokeless Tobacco Use: Never    Passive Exposure: Not on Actuary Strain: Not on file  Food Insecurity: No Food Insecurity (05/13/2024)   Epic    Worried About Programme Researcher, Broadcasting/film/video in the Last Year: Never true    Ran Out of Food in the Last Year: Never true  Transportation Needs: No Transportation Needs (05/13/2024)   Epic    Lack of Transportation (Medical): No    Lack of Transportation (Non-Medical): No  Physical Activity: Not on file  Stress: Not on file  Social Connections: Not on file  Intimate Partner Violence: Not At Risk (05/13/2024)   Epic    Fear of Current or Ex-Partner: No    Emotionally Abused: No    Physically Abused:  No    Sexually Abused: No  Depression (PHQ2-9): Medium Risk (05/19/2024)   Depression (PHQ2-9)    PHQ-2 Score: 9  Alcohol Screen: Not on file  Housing: Unknown (05/13/2024)   Epic    Unable to Pay for Housing in the Last Year: No    Number of Times Moved in the Last Year: Not on file    Homeless in the Last Year: No  Utilities: Not At Risk (05/13/2024)   Epic    Threatened with loss of utilities: No  Health Literacy: Not on file   FH:  Family History  Problem Relation Age of Onset   Diabetes Mother    Stroke Father    Diabetes Sister    Current Outpatient Medications  Medication Sig Dispense Refill   amiodarone  (PACERONE ) 200 MG tablet Take 1 tablet (200 mg total) by mouth daily.     apixaban  (ELIQUIS ) 5 MG TABS tablet Take 1 tablet (5 mg total) by mouth 2 (two) times daily. 60 tablet 0   bisacodyl  (DULCOLAX) 5 MG EC tablet Take 5 mg by mouth daily as needed for mild constipation.     Cholecalciferol (VITAMIN D3) 50 MCG (2000 UT) capsule Take 1 capsule (2,000 Units total) by mouth daily. 90 capsule 0   digoxin  (LANOXIN ) 0.125 MG tablet Take 1 tablet (125 mcg total) by mouth daily. 90 tablet 0   FARXIGA  10 MG TABS tablet Take 1 tablet (10 mg total) by mouth  daily before breakfast. 90 tablet 3   Iron , Ferrous Sulfate , 325 (65 Fe) MG TABS Take 325 mg by mouth every other day. 30 tablet 1   potassium chloride  SA (KLOR-CON  M20) 20 MEQ tablet Take 3 tablets (60 mEq total) by mouth daily. 270 tablet 3   sacubitril -valsartan  (ENTRESTO ) 24-26 MG Take 1 tablet by mouth 2 (two) times daily. 60 tablet 0   spironolactone  (ALDACTONE ) 50 MG tablet Take 1 tablet (50 mg total) by mouth daily. 30 tablet 0   thiamine  (VITAMIN B1) 100 MG tablet Take 1 tablet (100 mg total) by mouth daily. 30 tablet 0   tirzepatide  (MOUNJARO ) 2.5 MG/0.5ML Pen Inject 2.5 mg into the skin once a week. 2 mL 1   torsemide  (DEMADEX ) 20 MG tablet Take 3 tablets (60 mg total) by mouth daily. 90 tablet 3   No current facility-administered medications for this encounter.   Wt Readings from Last 3 Encounters:  06/14/24 (!) 137.3 kg (302 lb 12.8 oz)  06/04/24 (!) 137.9 kg (304 lb)  05/24/24 (!) 143.8 kg (317 lb)   BP 110/72   Pulse 90   Ht 5' 6 (1.676 m)   Wt (!) 137.3 kg (302 lb 12.8 oz)   SpO2 95%   BMI 48.87 kg/m   PHYSICAL EXAM: General:  NAD. No resp difficulty, walked into clinic HEENT: Normal Neck: Supple. No JVD. Cor: Regular rate & rhythm. No rubs, gallops or murmurs. Lungs: Clear Abdomen: Soft, obese, nontender, nondistended.  Extremities: No cyanosis, clubbing, rash, edema Neuro: Alert & oriented x 3, moves all 4 extremities w/o difficulty. Affect pleasant.  ASSESSMENT & PLAN: 1. Chronic systolic CHF: Nonischemic cardiomyopathy. [employer] ICD. Symptomatic since 9/21 (after episode of peri-tonsillar abscess).  He drinks ETOH but does not seem to describe heavy enough ETOH to cause profound cardiomyopathy (though he did have mild cirrhosis on abdominal US  at [facility]). No FH of cardiomyopathy. Echo 11/21 with EF < 20% with severe LV dilation, moderate RVE with severely decreased systolic function, severe biatrial enlargement,  mild MR. [facility] 12/21 showed no coronary  disease, markedly elevated filling pressures, and low cardiac output. Cardiac MRI in 11/21 showed severely dilated LV with diffuse hypokinesis, EF 17%, mod dilated RV with EF 20%, diffuse mid-wall LGE in septum, inferior wall & inferolateral wall.  LGE pattern suggests possible prior myocarditis. Echo in 3/22 showed that EF remains < 20% with mild RV dysfunction. CPX in 4/22 showed only a mild HF limitation.  Trypanosoma cruzi antibodies were negative (had h/o neurocysticercosis), probably not cardiomyopathy related to trypanosomiasis. Echo 1/25 showed EF 20-25%, G3DD, RV mildly reduced. Echo this admission 11/25, with EF <20%, mildly reduced RV. Improved NYHA II today, he is not volume overloaded by exam, weight down 15 lbs, Reds normal at 26% - Restart Toprol  XL 25 mg daily. - Continue torsemide  60 mg daily + 60 KCL daily. Check BMET - Continue Farxiga  10 mg daily. - Continue spironolactone  50 mg daily.   - Continue digoxin  0.125 mg daily.  - Continue Entresto  24/26 mg bid. - He has been referred to CR. - Repeat echo in 3 months. 2. Atrial flutter: new as of ICM remote monitoring 04/17/24; seen on ECG by PCP. Underwent TEE guided DCCV 05/10/24 to NSR. Regular on exam today. - Continue amiodarone  200 mg daily. Check LFTs and TSH. He will need a regular eye exam while on amiodarone . - Continue Eliquis  5 mg bid. No bleeding issues. No bleeding issues - Refer to EP for ablation discussions. 3. Anemia: he received IV iron  during recent admission.  - PCP referred to GI due to concern for GIB. 4. ETOH:  He did have early cirrhosis on abdominal US  at [facility]  However, with obesity could be NAFLD. He is cutting back his beer intake - Discussed cessation. 5. OSA: Moderate by sleep study 08/2020. AHI 31.1/hr - Needs CPAP, awaiting delivery from company. 6. Morbid Obesity: Body mass index is 48.87 kg/m. Down 15 lbs! - Continue Mounjaro . 7. SDOH: He is insured and has transportation. - Farxiga  $45/90 day,  Entresto  $45/90 (can use co-pay cards for each)   Follow up in 3 months with Dr. Rolan + echo  [place], FNP-BC 06/14/2024

## 2024-06-11 ENCOUNTER — Encounter: Payer: Self-pay | Admitting: *Deleted

## 2024-06-11 ENCOUNTER — Telehealth (HOSPITAL_COMMUNITY): Payer: Self-pay

## 2024-06-11 ENCOUNTER — Telehealth: Payer: Self-pay | Admitting: *Deleted

## 2024-06-11 NOTE — Telephone Encounter (Signed)
 Called to confirm/remind patient of their appointment at the [facility] on 06/14/24.   Appointment:   [] Confirmed  [x] Left mess   [] No answer/No voice mail  [] VM Full/unable to leave message  [] Phone not in service  And to bring in all medications and/or complete list.

## 2024-06-14 ENCOUNTER — Encounter (HOSPITAL_COMMUNITY): Payer: Self-pay

## 2024-06-14 ENCOUNTER — Ambulatory Visit (HOSPITAL_COMMUNITY)
Admission: RE | Admit: 2024-06-14 | Discharge: 2024-06-14 | Disposition: A | Source: Ambulatory Visit | Attending: Family Medicine

## 2024-06-14 ENCOUNTER — Other Ambulatory Visit: Payer: Self-pay | Admitting: Family Medicine

## 2024-06-14 ENCOUNTER — Encounter: Payer: Self-pay | Admitting: *Deleted

## 2024-06-14 ENCOUNTER — Telehealth: Payer: Self-pay | Admitting: *Deleted

## 2024-06-14 VITALS — BP 110/72 | HR 90 | Ht 66.0 in | Wt 302.8 lb

## 2024-06-14 DIAGNOSIS — F1011 Alcohol abuse, in remission: Secondary | ICD-10-CM | POA: Diagnosis not present

## 2024-06-14 DIAGNOSIS — G4733 Obstructive sleep apnea (adult) (pediatric): Secondary | ICD-10-CM | POA: Diagnosis not present

## 2024-06-14 DIAGNOSIS — F1090 Alcohol use, unspecified, uncomplicated: Secondary | ICD-10-CM | POA: Diagnosis not present

## 2024-06-14 DIAGNOSIS — I483 Typical atrial flutter: Secondary | ICD-10-CM

## 2024-06-14 DIAGNOSIS — Z6841 Body Mass Index (BMI) 40.0 and over, adult: Secondary | ICD-10-CM | POA: Diagnosis not present

## 2024-06-14 DIAGNOSIS — D649 Anemia, unspecified: Secondary | ICD-10-CM | POA: Diagnosis not present

## 2024-06-14 DIAGNOSIS — I5022 Chronic systolic (congestive) heart failure: Secondary | ICD-10-CM | POA: Diagnosis not present

## 2024-06-14 DIAGNOSIS — I4892 Unspecified atrial flutter: Secondary | ICD-10-CM | POA: Diagnosis not present

## 2024-06-14 DIAGNOSIS — Z87891 Personal history of nicotine dependence: Secondary | ICD-10-CM | POA: Diagnosis not present

## 2024-06-14 DIAGNOSIS — Z7985 Long-term (current) use of injectable non-insulin antidiabetic drugs: Secondary | ICD-10-CM | POA: Diagnosis not present

## 2024-06-14 DIAGNOSIS — I509 Heart failure, unspecified: Secondary | ICD-10-CM | POA: Diagnosis present

## 2024-06-14 DIAGNOSIS — I428 Other cardiomyopathies: Secondary | ICD-10-CM | POA: Diagnosis not present

## 2024-06-14 DIAGNOSIS — Z7901 Long term (current) use of anticoagulants: Secondary | ICD-10-CM | POA: Diagnosis not present

## 2024-06-14 DIAGNOSIS — Z79899 Other long term (current) drug therapy: Secondary | ICD-10-CM | POA: Diagnosis not present

## 2024-06-14 DIAGNOSIS — Z7984 Long term (current) use of oral hypoglycemic drugs: Secondary | ICD-10-CM | POA: Diagnosis not present

## 2024-06-14 DIAGNOSIS — Z95810 Presence of automatic (implantable) cardiac defibrillator: Secondary | ICD-10-CM | POA: Diagnosis not present

## 2024-06-14 LAB — COMPREHENSIVE METABOLIC PANEL WITH GFR
ALT: 16 U/L (ref 0–44)
AST: 24 U/L (ref 15–41)
Albumin: 3.8 g/dL (ref 3.5–5.0)
Alkaline Phosphatase: 63 U/L (ref 38–126)
Anion gap: 5 (ref 5–15)
BUN: 12 mg/dL (ref 6–20)
CO2: 31 mmol/L (ref 22–32)
Calcium: 9.5 mg/dL (ref 8.9–10.3)
Chloride: 100 mmol/L (ref 98–111)
Creatinine, Ser: 1.01 mg/dL (ref 0.61–1.24)
GFR, Estimated: >60 mL/min (ref >60)
Glucose, Bld: 90 mg/dL (ref 70–99)
Potassium: 4 mmol/L (ref 3.5–5.1)
Sodium: 136 mmol/L (ref 135–145)
Total Bilirubin: 1.6 mg/dL — ABNORMAL HIGH (ref 0.0–1.2)
Total Protein: 7.7 g/dL (ref 6.5–8.1)

## 2024-06-14 LAB — TSH: TSH: 2.299 u[IU]/mL (ref 0.350–4.500)

## 2024-06-14 MED ORDER — SPIRONOLACTONE 50 MG PO TABS: 50.0000 mg | ORAL_TABLET | Freq: Every day | ORAL | 6 refills | Status: AC

## 2024-06-14 MED ORDER — METOPROLOL SUCCINATE ER 25 MG PO TB24: 25.0000 mg | ORAL_TABLET | Freq: Every day | ORAL | 6 refills | Status: AC

## 2024-06-14 MED ORDER — DIGOXIN 125 MCG PO TABS: 125.0000 ug | ORAL_TABLET | Freq: Every day | ORAL | 6 refills | Status: AC

## 2024-06-14 MED ORDER — SACUBITRIL-VALSARTAN 24-26 MG PO TABS: 1.0000 | ORAL_TABLET | Freq: Two times a day (BID) | ORAL | 6 refills | Status: AC

## 2024-06-14 MED ORDER — APIXABAN 5 MG PO TABS: 5.0000 mg | ORAL_TABLET | Freq: Two times a day (BID) | ORAL | 11 refills | Status: AC

## 2024-06-14 NOTE — Patient Outreach (Signed)
 Transition of Care week 4  Visit Note  06/14/2024  Name: Kyle Pearson MRN: [id number]          DOB: 08-26-1975  Situation: Patient enrolled in [facility] 30-day program. Visit completed with patient by telephone.   Background:  Discharge Date and Diagnosis: No data recorded   Past Medical History:  Diagnosis Date   Atrial flutter (HCC)    CHF (congestive heart failure) (HCC)    OSA (obstructive sleep apnea)    Pacemaker    Pulmonary hypertensive venous disease (HCC)    Type 2 diabetes mellitus with hyperglycemia, without long-term current use of insulin  (HCC)     Assessment: Patient Reported Symptoms: Cognitive Cognitive Status: No symptoms reported, Alert and oriented to person, place, and time, Able to follow simple commands, Normal speech and language skills      Neurological Neurological Review of Symptoms: No symptoms reported    HEENT HEENT Symptoms Reported: No symptoms reported      Cardiovascular Cardiovascular Symptoms Reported: No symptoms reported Does patient have uncontrolled Hypertension?: No Weight: 295 lb (133.8 kg) Cardiovascular Self-Management Outcome: 4 (good) Cardiovascular Comment: reviewed HF action plan, pt is weighing daily  Respiratory Respiratory Symptoms Reported: No symptoms reported Other Respiratory Symptoms: states has dypsnea  only when exercising  Respiratory Management Strategies: Adequate rest Respiratory Self-Management Outcome: 4 (good)  Endocrine Endocrine Symptoms Reported: No symptoms reported Is patient diabetic?: No    Gastrointestinal Gastrointestinal Symptoms Reported: No symptoms reported      Genitourinary Genitourinary Symptoms Reported: No symptoms reported    Integumentary Integumentary Symptoms Reported: No symptoms reported    Musculoskeletal Musculoskelatal Symptoms Reviewed: No symptoms reported        Psychosocial Psychosocial Symptoms Reported: No symptoms reported         Today's Vitals    06/14/24 1504  Weight: 295 lb (133.8 kg)   Pain Scale: 0-10 Pain Score: 0-No pain  Medications Reviewed Today     Reviewed by Aura Mliss LABOR, RN (Registered Nurse) on 06/14/24 at 1506  Med List Status: <None>   Medication Order Taking? Sig Documenting Provider Last Dose Status Informant  amiodarone  (PACERONE ) 200 MG tablet [id number]  Take 1 tablet (200 mg total) by mouth daily. Kayla Gauze S, FNP  Active   apixaban  (ELIQUIS ) 5 MG TABS tablet [id number]  Take 1 tablet (5 mg total) by mouth 2 (two) times daily. [place], [place], [place]  Active   bisacodyl  (DULCOLAX) 5 MG EC tablet [id number]  Take 5 mg by mouth daily as needed for mild constipation. [provider]  Active Self, Pharmacy Records  Cholecalciferol (VITAMIN D3) 50 MCG (2000 UT) capsule [id number]  Take 1 capsule (2,000 Units total) by mouth daily. Kayla Gauze RAMAN, FNP  Active Self, Pharmacy Records  digoxin  (LANOXIN ) 0.125 MG tablet [id number]  Take 1 tablet (125 mcg total) by mouth daily. [place], [place], [place]  Active   FARXIGA  10 MG TABS tablet [id number]  Take 1 tablet (10 mg total) by mouth daily before breakfast. Lelon Glendia DASEN, PA-C  Active Self, Pharmacy Records  Iron , Ferrous Sulfate , 325 (65 Fe) MG TABS [id number]  Take 325 mg by mouth every other day. Kayla Gauze RAMAN, FNP  Active Self, Pharmacy Records  metoprolol  succinate (TOPROL  XL) 25 MG 24 hr tablet [id number]  Take 1 tablet (25 mg total) by mouth daily. Glena Raisin [place], [place]  Active   potassium chloride  SA (KLOR-CON  M20) 20 MEQ tablet [id number]  Take 3 tablets (60 mEq total)  by mouth daily. [place], [place], [place]  Active   sacubitril -valsartan  (ENTRESTO ) 24-26 MG [id number]  Take 1 tablet by mouth 2 (two) times daily. [place], [place], [place]  Active   spironolactone  (ALDACTONE ) 50 MG tablet [id number]  Take 1 tablet (50 mg total) by mouth daily. [place], [place], [place]  Active   thiamine  (VITAMIN B1) 100 MG tablet [id number]  Take 1 tablet (100 mg total) by  mouth daily. Tobie Yetta HERO, MD  Active   tirzepatide  (MOUNJARO ) 2.5 MG/0.5ML Pen [id number]  Inject 2.5 mg into the skin once a week. Kayla Jeoffrey RAMAN, FNP  Active   torsemide  (DEMADEX ) 20 MG tablet [id number]  Take 3 tablets (60 mg total) by mouth daily. [place], [place] HERO, FNP  Active             Goals Addressed             This Visit's Progress    [facility] Transitions of Care (TOC) Care Plan       Problems:  Recent Hospitalization for treatment of CHF Equipment/DME barrier [employer] will be providing CPAP, pt states he has contact # and is able to call and follow up with [employer] within a few days if does not hear from them and Knowledge Deficit Related to CHF  Patient reports he needs a new scale and will be purchasing scale and blood pressure cuff today, will start weighing and checking blood pressure.  Pt reports he has dizziness on occasion, at times when I stand up too quickly,  denies any dizziness today. 05/21/24- spoke with pt who reports he has not heard anything about CPAP, not sure if anyone tried to call him and maybe he did not answer, (RN CM to check on)  pt did get a scale and is weighing daily, weight has decreased to 309 pounds 06/04/24-spoke with pt who reports he did talk with DME company about CPAP and they are still awaiting on insurance approval,  pt is weighing daily 06/14/24- spoke with pt who reports  I'm doing very well, losing weight and I feel so much better  Goal:  Over the next 30 days, the patient will not experience hospital readmission  Interventions:  Heart Failure Interventions: Reviewed role of diuretics in prevention of fluid overload and management of heart failure; Discussed the importance of keeping all appointments with provider Reviewed importance of checking daily weight and blood pressure and keeping a log Reinforced HF action plan Reviewed energy conservation Reviewed with pt to rise slowly from bed, chair and do not ambulate if  dizzy Reviewed plan of care with pt including case closure  Patient Self Care Activities:  Attend all scheduled provider appointments Call pharmacy for medication refills 3-7 days in advance of running out of medications Call provider office for new concerns or questions  Notify RN Care Manager of TOC call rescheduling needs Participate in Transition of Care Program/Attend TOC scheduled calls Perform IADL's (shopping, preparing meals, housekeeping, managing finances) independently Take medications as prescribed   call office if I gain more than 2 pounds in one day or 5 pounds in one week keep legs up while sitting track weight in diary use salt in moderation watch for swelling in feet, ankles and legs every day weigh myself daily develop a rescue plan follow rescue plan if symptoms flare-up eat more whole grains, fruits and vegetables, lean meats and healthy fats Please begin weighing and checking blood pressure daily and keeping a log Rise slowly from your bed  and chair to help prevent dizziness Continue weighing daily and keeping a log Case closure  Plan:  Case closure        Recommendation:   PCP Follow-up  Follow Up Plan:   Closing From:  Transitions of Care Program  Mliss Creed [facility], BSN RN Care Manager/ Transition of Care [place]/ [facility] Population [facility] [phone]

## 2024-06-14 NOTE — Patient Instructions (Signed)
 Visit Information  Thank you for taking time to visit with me today. Please don't hesitate to contact me if I can be of assistance to you before our next scheduled telephone appointment.  Following are the goals we discussed today:   Goals Addressed             This Visit's Progress    [facility] Transitions of Care (TOC) Care Plan       Problems:  Recent Hospitalization for treatment of CHF Equipment/DME barrier [employer] will be providing CPAP, pt states he has contact # and is able to call and follow up with [employer] within a few days if does not hear from them and Knowledge Deficit Related to CHF  Patient reports he needs a new scale and will be purchasing scale and blood pressure cuff today, will start weighing and checking blood pressure.  Pt reports he has dizziness on occasion, at times when I stand up too quickly,  denies any dizziness today. 05/21/24- spoke with pt who reports he has not heard anything about CPAP, not sure if anyone tried to call him and maybe he did not answer, (RN CM to check on)  pt did get a scale and is weighing daily, weight has decreased to 309 pounds 06/04/24-spoke with pt who reports he did talk with DME company about CPAP and they are still awaiting on insurance approval,  pt is weighing daily 06/14/24- spoke with pt who reports  I'm doing very well, losing weight and I feel so much better  Goal:  Over the next 30 days, the patient will not experience hospital readmission  Interventions:  Heart Failure Interventions: Reviewed role of diuretics in prevention of fluid overload and management of heart failure; Discussed the importance of keeping all appointments with provider Reviewed importance of checking daily weight and blood pressure and keeping a log Reinforced HF action plan Reviewed energy conservation Reviewed with pt to rise slowly from bed, chair and do not ambulate if dizzy Reviewed plan of care with pt including case closure  Patient Self Care  Activities:  Attend all scheduled provider appointments Call pharmacy for medication refills 3-7 days in advance of running out of medications Call provider office for new concerns or questions  Notify RN Care Manager of TOC call rescheduling needs Participate in Transition of Care Program/Attend TOC scheduled calls Perform IADL's (shopping, preparing meals, housekeeping, managing finances) independently Take medications as prescribed   call office if I gain more than 2 pounds in one day or 5 pounds in one week keep legs up while sitting track weight in diary use salt in moderation watch for swelling in feet, ankles and legs every day weigh myself daily develop a rescue plan follow rescue plan if symptoms flare-up eat more whole grains, fruits and vegetables, lean meats and healthy fats Please begin weighing and checking blood pressure daily and keeping a log Rise slowly from your bed and chair to help prevent dizziness Continue weighing daily and keeping a log Case closure  Plan:  Case closure          Please call the care guide team at [phone] if you need to cancel or reschedule your appointment.   If you are experiencing a Mental Health or Behavioral Health Crisis or need someone to talk to, please call the Suicide and Crisis Lifeline: 988 call the [place]  National Suicide Prevention Lifeline: 727-671-6057 or TTY: 7814732976 TTY 7261604011) to talk to a trained counselor call 1-800-273-TALK (toll free, 24 hour hotline)  go to [facility] Urgent [facility] [address] [place] [phone]) call 911   Care plan and visit instructions communicated with the patient verbally today. Patient agrees to receive a copy in MyChart. Active MyChart status and patient understanding of how to access instructions and care plan via MyChart confirmed with patient.     No further follow up required: case closure  Mliss Creed [facility], BSN RN Care Manager/  Transition of Care [place]/ [facility] [phone]

## 2024-06-14 NOTE — Patient Instructions (Signed)
 Medication Changes:  START Metoprolol  XL 25 mg Daily  Lab Work:  Labs done today, your results will be available in MyChart, we will contact you for abnormal readings.  Testing/Procedures:  Your physician has requested that you have an echocardiogram. Echocardiography is a painless test that uses sound waves to create images of your heart. It provides your doctor with information about the size and shape of your heart and how well your hearts chambers and valves are working. This procedure takes approximately one hour. There are no restrictions for this procedure. Please do NOT wear cologne, perfume, aftershave, or lotions (deodorant is allowed). Please arrive 15 minutes prior to your appointment time.  IN 3 MONTHS  Please note: We ask at that you not bring children with you during ultrasound (echo/ vascular) testing. Due to room size and safety concerns, children are not allowed in the ultrasound rooms during exams. Our front office staff cannot provide observation of children in our lobby area while testing is being conducted. An adult accompanying a patient to their appointment will only be allowed in the ultrasound room at the discretion of the ultrasound technician under special circumstances. We apologize for any inconvenience.   Referrals:  You have been referred to EP to discuss possible a-flutter ablation, they will call you for an appointment  Special Instructions // Education:  Do the following things EVERYDAY: Weigh yourself in the morning before breakfast. Write it down and keep it in a log. Take your medicines as prescribed Eat low salt foods--Limit salt (sodium) to 2000 mg per day.  Stay as active as you can everyday Limit all fluids for the day to less than 2 liters   Follow-Up in: 3 months with an echocardiogram   At the [facility], you and your health needs are our priority. We have a designated team specialized in the treatment of Heart  Failure. This Care Team includes your primary Heart Failure Specialized Cardiologist (physician), Advanced Practice Providers (APPs- Physician Assistants and Nurse Practitioners), and Pharmacist who all work together to provide you with the care you need, when you need it.   You may see any of the following providers on your designated Care Team at your next follow up:  Dr. Toribio Fuel Dr. Ezra Shuck Dr. Odis Brownie Greig Mosses, NP Caffie Shed, [place] [facility] [place], [place] Beckey Coe, NP Jordan Lee, NP Tinnie Redman, PharmD   Please be sure to bring in all your medications bottles to every appointment.   Need to Contact Us :  If you have any questions or concerns before your next appointment please send us  a message through [place] or call our office at [phone].    TO LEAVE A MESSAGE FOR THE NURSE SELECT OPTION 2, PLEASE LEAVE A MESSAGE INCLUDING: YOUR NAME DATE OF BIRTH CALL BACK NUMBER REASON FOR CALL**this is important as we prioritize the call backs  YOU WILL RECEIVE A CALL BACK THE SAME DAY AS LONG AS YOU CALL BEFORE 4:00 PM

## 2024-06-15 ENCOUNTER — Telehealth (HOSPITAL_COMMUNITY): Payer: Self-pay

## 2024-06-15 ENCOUNTER — Ambulatory Visit (HOSPITAL_COMMUNITY): Payer: Self-pay | Admitting: Family Medicine

## 2024-06-15 NOTE — Telephone Encounter (Signed)
 F/u call regarding cardiac rehab- no answer, left message.  Closing referral.

## 2024-06-15 NOTE — Telephone Encounter (Signed)
 Patient called back stating he is still interested in cardiac rehab. Informed him our January schedule is not yet open and we will call him when it opens as December is currently full.

## 2024-06-20 ENCOUNTER — Emergency Department (HOSPITAL_COMMUNITY)

## 2024-06-20 ENCOUNTER — Encounter (HOSPITAL_COMMUNITY): Payer: Self-pay

## 2024-06-20 ENCOUNTER — Other Ambulatory Visit: Payer: Self-pay

## 2024-06-20 ENCOUNTER — Inpatient Hospital Stay (HOSPITAL_COMMUNITY)
Admission: EM | Admit: 2024-06-20 | Discharge: 2024-06-23 | DRG: 292 | Disposition: A | Discharge status: Home / Self Care | Attending: Internal Medicine | Admitting: Internal Medicine

## 2024-06-20 DIAGNOSIS — N179 Acute kidney failure, unspecified: Secondary | ICD-10-CM | POA: Diagnosis present

## 2024-06-20 DIAGNOSIS — K7469 Other cirrhosis of liver: Secondary | ICD-10-CM | POA: Diagnosis present

## 2024-06-20 DIAGNOSIS — E1165 Type 2 diabetes mellitus with hyperglycemia: Secondary | ICD-10-CM | POA: Diagnosis present

## 2024-06-20 DIAGNOSIS — K7030 Alcoholic cirrhosis of liver without ascites: Secondary | ICD-10-CM | POA: Diagnosis present

## 2024-06-20 DIAGNOSIS — E1122 Type 2 diabetes mellitus with diabetic chronic kidney disease: Secondary | ICD-10-CM | POA: Diagnosis present

## 2024-06-20 DIAGNOSIS — J101 Influenza due to other identified influenza virus with other respiratory manifestations: Secondary | ICD-10-CM | POA: Diagnosis present

## 2024-06-20 DIAGNOSIS — Z7901 Long term (current) use of anticoagulants: Secondary | ICD-10-CM

## 2024-06-20 DIAGNOSIS — D649 Anemia, unspecified: Secondary | ICD-10-CM | POA: Diagnosis present

## 2024-06-20 DIAGNOSIS — Z87891 Personal history of nicotine dependence: Secondary | ICD-10-CM

## 2024-06-20 DIAGNOSIS — G4733 Obstructive sleep apnea (adult) (pediatric): Secondary | ICD-10-CM | POA: Diagnosis present

## 2024-06-20 DIAGNOSIS — R5381 Other malaise: Secondary | ICD-10-CM | POA: Diagnosis present

## 2024-06-20 DIAGNOSIS — E66813 Obesity, class 3: Secondary | ICD-10-CM | POA: Diagnosis present

## 2024-06-20 DIAGNOSIS — Z1152 Encounter for screening for COVID-19: Secondary | ICD-10-CM

## 2024-06-20 DIAGNOSIS — Z823 Family history of stroke: Secondary | ICD-10-CM

## 2024-06-20 DIAGNOSIS — K921 Melena: Secondary | ICD-10-CM | POA: Diagnosis present

## 2024-06-20 DIAGNOSIS — I4892 Unspecified atrial flutter: Secondary | ICD-10-CM | POA: Diagnosis present

## 2024-06-20 DIAGNOSIS — Z91199 Patient's noncompliance with other medical treatment and regimen due to unspecified reason: Secondary | ICD-10-CM

## 2024-06-20 DIAGNOSIS — I5043 Acute on chronic combined systolic (congestive) and diastolic (congestive) heart failure: Principal | ICD-10-CM

## 2024-06-20 DIAGNOSIS — Z79899 Other long term (current) drug therapy: Secondary | ICD-10-CM

## 2024-06-20 DIAGNOSIS — F1010 Alcohol abuse, uncomplicated: Secondary | ICD-10-CM

## 2024-06-20 DIAGNOSIS — Z6841 Body Mass Index (BMI) 40.0 and over, adult: Secondary | ICD-10-CM

## 2024-06-20 DIAGNOSIS — N1832 Chronic kidney disease, stage 3b: Secondary | ICD-10-CM | POA: Diagnosis present

## 2024-06-20 DIAGNOSIS — Z7985 Long-term (current) use of injectable non-insulin antidiabetic drugs: Secondary | ICD-10-CM

## 2024-06-20 DIAGNOSIS — E871 Hypo-osmolality and hyponatremia: Secondary | ICD-10-CM | POA: Diagnosis present

## 2024-06-20 DIAGNOSIS — I509 Heart failure, unspecified: Secondary | ICD-10-CM

## 2024-06-20 DIAGNOSIS — I5023 Acute on chronic systolic (congestive) heart failure: Principal | ICD-10-CM | POA: Diagnosis present

## 2024-06-20 DIAGNOSIS — Z833 Family history of diabetes mellitus: Secondary | ICD-10-CM

## 2024-06-20 LAB — CBC WITH DIFFERENTIAL/PLATELET

## 2024-06-20 LAB — PROTIME-INR
INR: 1.4 — ABNORMAL HIGH (ref 0.8–1.2)
Prothrombin Time: 18.3 s — ABNORMAL HIGH (ref 11.4–15.2)

## 2024-06-20 LAB — DIGOXIN LEVEL: Digoxin Level: 0.9 ng/mL (ref 0.8–2.0)

## 2024-06-20 MED ORDER — FUROSEMIDE 10 MG/ML IJ SOLN
20.0000 mg | Freq: Once | INTRAMUSCULAR | Status: AC
  Administered 2024-06-20: 20 mg via INTRAVENOUS
  Filled 2024-06-20: qty 2

## 2024-06-20 NOTE — ED Triage Notes (Signed)
 Pt BIB from work, SOB, chest pressure, radiating left arm pain, dizziness, nausea, and dry heaving. Pt states he's been coughing with a fever since 12.17.2025. Hx of CHF.    4mg  zofran  10mg  albuterol  1mg  Atrovent 125mg  solumedrol

## 2024-06-20 NOTE — ED Provider Notes (Incomplete)
" [place] [facility] Provider Note   CSN: [id number] Arrival date & time: 06/20/24  2252     Patient presents with: Shortness of Breath   Kyle Pearson is a 48 y.o. male with history of CHF with EF less than 20%, cirrhosis, type 2 diabetes, A-flutter anticoagulated on Eliquis .  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  Received 4 mg of Zofran , 10 of albuterol , 1 of Atrovent, and 125 of Solu-Medrol and route with EMS. Of note patient was admitted approximately 1 month ago with cardiogenic shock secondary to heart failure and was temporarily on dobutamine .  Medications were adjusted by cardiology at that time and patient endorses compliance with this medication since his discharge, aside from when he ran out of his diuretics this week.   HPI     Prior to Admission medications  Medication Sig Start Date End Date Taking? Authorizing Provider  apixaban  (ELIQUIS ) 5 MG TABS tablet Take 1 tablet (5 mg total) by mouth 2 (two) times daily. 06/14/24  Yes Milford, Harlene HERO, FNP  Cholecalciferol (VITAMIN D3) 50 MCG (2000 UT) capsule Take 1 capsule (2,000 Units total) by mouth daily. 04/27/24  Yes Howard, Amber S, FNP  digoxin  (LANOXIN ) 0.125 MG tablet Take 1 tablet (125 mcg total) by mouth daily. 06/14/24  Yes Milford, Harlene HERO, FNP  FARXIGA  10 MG TABS tablet Take 1 tablet (10 mg total) by mouth daily before breakfast. 02/21/24  Yes Weaver, Scott T, PA-C  Iron , Ferrous Sulfate , 325 (65 Fe) MG TABS Take 325 mg by mouth every other day. 04/27/24  Yes Kayla Jeoffrey RAMAN, FNP  metoprolol  succinate (TOPROL  XL) 25 MG 24 hr tablet Take 1 tablet (25 mg total) by mouth daily. 06/14/24  Yes  Milford, Harlene HERO, FNP  potassium chloride  SA (KLOR-CON  M20) 20 MEQ tablet Take 3 tablets (60 mEq total) by mouth daily. Patient taking differently: Take 40 mEq by mouth daily. 05/24/24  Yes Milford, Harlene HERO, FNP  sacubitril -valsartan  (ENTRESTO ) 24-26 MG Take 1 tablet by mouth 2 (two) times daily. 06/14/24  Yes Milford, Harlene HERO, FNP  spironolactone  (ALDACTONE ) 50 MG tablet Take 1 tablet (50 mg total) by mouth daily. 06/14/24  Yes Milford, Harlene HERO, FNP  thiamine  (VITAMIN B1) 100 MG tablet Take 1 tablet (100 mg total) by mouth daily. 05/12/24  Yes Tobie Yetta HERO, MD  tirzepatide  (MOUNJARO ) 2.5 MG/0.5ML Pen Inject 2.5 mg into the skin once a week. 05/19/24  Yes Howard, Amber S, FNP  torsemide  (DEMADEX ) 20 MG tablet Take 3 tablets (60 mg total) by mouth daily. 05/24/24  Yes Milford, Harlene HERO, FNP  amiodarone  (PACERONE ) 200 MG tablet Take 1 tablet (200 mg total) by mouth daily. Patient not taking: Reported on 06/20/2024 05/19/24 06/18/24  Kayla Jeoffrey RAMAN, FNP  bisacodyl  (DULCOLAX) 5 MG EC tablet Take 5 mg by mouth daily as needed for mild constipation. Patient not taking: Reported on 06/20/2024    [provider]    Allergies: Raspberry and Penicillins    Review of Systems  Constitutional: Negative.   HENT: Negative.    Respiratory:  Positive for shortness of breath.   Cardiovascular:  Positive for chest pain and leg swelling.  Gastrointestinal: Negative.   Neurological:  Positive for light-headedness.    Updated Vital Signs BP 102/70 (BP Location: Right Arm)   Pulse 76   Temp 98.3 F (36.8 C) (Oral)   Resp 16   Ht 5' 6 (1.676 m)   Wt 133.8 kg   SpO2 98%   BMI 47.61 kg/m   Physical Exam Vitals and nursing note reviewed.  Constitutional:      Appearance: He is not ill-appearing or toxic-appearing.  HENT:     Head: Normocephalic and atraumatic.     Mouth/Throat:     Mouth: Mucous membranes are moist.     Pharynx: No oropharyngeal exudate or posterior  oropharyngeal erythema.  Eyes:     General:        Right eye: No discharge.        Left eye: No discharge.     Conjunctiva/sclera: Conjunctivae normal.  Cardiovascular:     Rate and Rhythm: Normal rate and regular rhythm.     Pulses: Normal pulses.  Pulmonary:     Effort: Tachypnea and accessory muscle usage present. No respiratory distress.     Breath sounds: Examination of the right-middle field reveals rales. Examination of the left-middle field reveals rales. Examination of the right-lower field reveals rales. Examination of the left-lower field reveals rales. Rales present. No wheezing.  Abdominal:     General: Bowel sounds are normal. There is no distension.     Tenderness: There is no abdominal tenderness.  Musculoskeletal:        General: No deformity.     Cervical back: Neck supple.     Right lower leg: Edema present.     Left lower leg: Edema present.  Skin:    General: Skin is warm and dry.     Capillary Refill: Capillary refill takes less than 2 seconds.  Neurological:     General: No focal deficit present.     Mental Status: He is alert. Mental status is at baseline.  Psychiatric:        Mood and Affect: Mood normal.     (all labs ordered are listed, but only abnormal results are displayed) Labs Reviewed  RESP PANEL BY RT-PCR (RSV, FLU A&B, COVID)  RVPGX2 - Abnormal; Notable for the following components:      Result Value   Influenza A by PCR POSITIVE (*)    All other components within normal limits  COMPREHENSIVE METABOLIC PANEL WITH GFR - Abnormal; Notable for the following components:   Sodium 132 (*)    Chloride 95 (*)    Glucose, Bld 112 (*)    Creatinine, Ser 1.44 (*)    AST 44 (*)    All other components within normal limits  PRO BRAIN NATRIURETIC PEPTIDE - Abnormal; Notable for the following components:   Pro Brain Natriuretic Peptide 6,195.0 (*)    All other components within normal limits  CBC WITH DIFFERENTIAL/PLATELET - Abnormal; Notable for the  following components:   RDW 22.2 (*)    All other components within normal limits  PROTIME-INR - Abnormal; Notable for the following components:   Prothrombin Time 18.3 (*)    INR 1.4 (*)    All other components within normal limits  TROPONIN T, HIGH SENSITIVITY - Abnormal; Notable for the following components:   Troponin T High Sensitivity 46 (*)    All other components within normal limits  TROPONIN T, HIGH SENSITIVITY - Abnormal; Notable for the following components:   Troponin T High Sensitivity 39 (*)  All other components within normal limits  DIGOXIN  LEVEL    EKG: EKG Interpretation Date/Time:  Sunday June 20 2024 22:59:41 EST Ventricular Rate:  94 PR Interval:  205 QRS Duration:  113 QT Interval:  369 QTC Calculation: 462 R Axis:   -83  Text Interpretation: Sinus rhythm Prolonged PR interval Left anterior fascicular block Anteroseptal infarct, age indeterminate Confirmed by Melvenia Motto [phone]) on 06/20/2024 11:09:28 PM  Radiology: DG Chest 1 View Result Date: 06/20/2024 CLINICAL DATA:  Shortness of breath EXAM: PORTABLE CHEST 1 VIEW COMPARISON:  05/04/2024 FINDINGS: Cardiac shadow is stable. Defibrillator is again noted and stable. Previously seen right PICC has been removed in the interval. Patchy bibasilar atelectasis is noted. IMPRESSION: Patchy bibasilar atelectasis. Electronically Signed   By: Oneil Devonshire M.D.   On: 06/20/2024 23:44     Procedures   Medications Ordered in the ED  furosemide  (LASIX ) injection 20 mg (has no administration in time range)  furosemide  (LASIX ) injection 20 mg (20 mg Intravenous Given 06/20/24 2346)    Clinical Course as of 06/21/24 0238  Mon Jun 21, 2024  0233 Consult to hospitalist, Dr. Dena, who is amenable to admitting this patient to his service. I appreciate his collaboration in the care of this patient.  [RS]    Clinical Course User Index [RS] Juliette Standre, Pleasant SAUNDERS, PA-C                                 Medical  Decision Making 48 year old male with history of cirrhosis and CHF with significant reduced ejection fraction less than 20% who presents with concern for shortness of breath after running out of his diuretics a few days ago.  Tachypneic on intake, vital signs otherwise reassuring.  Cardiopulmonary exam with rales, mild 1+ nonpitting lower extremity edema bilaterally.  DDx includes but is not not limited to CHF, COPD, pleural effusion, dysrhythmia, ACS, pneumonia  Amount and/or Complexity of Data Reviewed Labs: ordered.    Details: CBC reassuring, INR is mildly elevated 1.4 in context of anticoagulation on Eliquis .CMP with mild AKI with creatinine of 1.4/patient baseline near 1.1.  Mild hyponatremia 132.  AST 44.  BNP greater than 6000, troponins elevated initially 46 down to 39 on repeat.  Influenza A positive.  Radiology: ordered.    Details:  Chest x-raywith atalectasis   Risk Prescription drug management. Decision regarding hospitalization.   Clinical patient was consistent with simultaneous influenza A infection and acute CHF exacerbation with BNP greater than 6000.  He will require admission to the hospital for further stabilization.  He is amenable to this plan.  Positive hospitalist as above.  He remains clinically stable on 2 L supplemental O2 via [place].  Christien voiced understanding of her medical evaluation and treatment plan. Each of their questions answered to their expressed satisfaction.  He is amenable to plan for admission at this time.   This chart was dictated using voice recognition software, Dragon. Despite the best efforts of this provider to proofread and correct errors, errors may still occur which can change documentation meaning.      Final diagnoses:  Acute on chronic combined systolic and diastolic congestive heart failure [facility])  Influenza A    ED Discharge Orders     None          Bobette Pleasant SAUNDERS, PA-C 06/21/24 0238    Shantay Sonn, Pleasant SAUNDERS,  PA-C 06/21/24 0305    Tonia Chew, MD 06/21/24  2304 ° °"

## 2024-06-21 DIAGNOSIS — E871 Hypo-osmolality and hyponatremia: Secondary | ICD-10-CM | POA: Diagnosis present

## 2024-06-21 DIAGNOSIS — Z7985 Long-term (current) use of injectable non-insulin antidiabetic drugs: Secondary | ICD-10-CM | POA: Diagnosis not present

## 2024-06-21 DIAGNOSIS — E1122 Type 2 diabetes mellitus with diabetic chronic kidney disease: Secondary | ICD-10-CM | POA: Diagnosis present

## 2024-06-21 DIAGNOSIS — I5023 Acute on chronic systolic (congestive) heart failure: Secondary | ICD-10-CM

## 2024-06-21 DIAGNOSIS — K7030 Alcoholic cirrhosis of liver without ascites: Secondary | ICD-10-CM | POA: Diagnosis present

## 2024-06-21 DIAGNOSIS — K7469 Other cirrhosis of liver: Secondary | ICD-10-CM | POA: Diagnosis not present

## 2024-06-21 DIAGNOSIS — Z833 Family history of diabetes mellitus: Secondary | ICD-10-CM | POA: Diagnosis not present

## 2024-06-21 DIAGNOSIS — J101 Influenza due to other identified influenza virus with other respiratory manifestations: Secondary | ICD-10-CM | POA: Diagnosis present

## 2024-06-21 DIAGNOSIS — D649 Anemia, unspecified: Secondary | ICD-10-CM | POA: Diagnosis present

## 2024-06-21 DIAGNOSIS — Z7901 Long term (current) use of anticoagulants: Secondary | ICD-10-CM | POA: Diagnosis not present

## 2024-06-21 DIAGNOSIS — R5381 Other malaise: Secondary | ICD-10-CM | POA: Diagnosis present

## 2024-06-21 DIAGNOSIS — E1165 Type 2 diabetes mellitus with hyperglycemia: Secondary | ICD-10-CM | POA: Diagnosis present

## 2024-06-21 DIAGNOSIS — Z1152 Encounter for screening for COVID-19: Secondary | ICD-10-CM | POA: Diagnosis not present

## 2024-06-21 DIAGNOSIS — K921 Melena: Secondary | ICD-10-CM | POA: Diagnosis present

## 2024-06-21 DIAGNOSIS — N179 Acute kidney failure, unspecified: Secondary | ICD-10-CM | POA: Diagnosis present

## 2024-06-21 DIAGNOSIS — Z823 Family history of stroke: Secondary | ICD-10-CM | POA: Diagnosis not present

## 2024-06-21 DIAGNOSIS — I4892 Unspecified atrial flutter: Secondary | ICD-10-CM | POA: Diagnosis present

## 2024-06-21 DIAGNOSIS — Z79899 Other long term (current) drug therapy: Secondary | ICD-10-CM | POA: Diagnosis not present

## 2024-06-21 DIAGNOSIS — Z91199 Patient's noncompliance with other medical treatment and regimen due to unspecified reason: Secondary | ICD-10-CM | POA: Diagnosis not present

## 2024-06-21 DIAGNOSIS — E66813 Obesity, class 3: Secondary | ICD-10-CM | POA: Diagnosis present

## 2024-06-21 DIAGNOSIS — G4733 Obstructive sleep apnea (adult) (pediatric): Secondary | ICD-10-CM | POA: Diagnosis present

## 2024-06-21 DIAGNOSIS — Z87891 Personal history of nicotine dependence: Secondary | ICD-10-CM | POA: Diagnosis not present

## 2024-06-21 DIAGNOSIS — Z6841 Body Mass Index (BMI) 40.0 and over, adult: Secondary | ICD-10-CM | POA: Diagnosis not present

## 2024-06-21 DIAGNOSIS — N1832 Chronic kidney disease, stage 3b: Secondary | ICD-10-CM | POA: Diagnosis present

## 2024-06-21 LAB — CBC WITH DIFFERENTIAL/PLATELET
Basophils Relative: 0 % (ref 0.0–0.1)
Eosinophils Absolute: 0 % (ref 0.0–0.5)
Eosinophils Relative: 0.1 % (ref 0.0–0.5)
HCT: 43.4 % (ref 39.0–52.0)
Hemoglobin: 14.1 g/dL (ref 13.0–17.0)
Lymphocytes Relative: 10 %
Lymphs Abs: 0.8 % (ref 0.7–4.0)
MCH: 26.3 pg (ref 26.0–34.0)
MCHC: 32.5 g/dL (ref 30.0–36.0)
MCV: 81 fL (ref 80.0–100.0)
Monocytes Absolute: 1 % (ref 0.1–1.0)
Monocytes Relative: 0.5 % (ref 0.1–1.0)
Monocytes Relative: 6 % (ref 0.7–4.0)
Neutro Abs: 6.6 K/uL (ref 1.7–7.7)
Neutrophils Relative %: 83 %
Platelets: 208 K/uL (ref 150–400)
RBC: 5.36 MIL/uL (ref 4.22–5.81)
RDW: 22.2 % — ABNORMAL HIGH (ref 11.5–15.5)
WBC: 7.9 K/uL (ref 4.0–10.5)
nRBC: 0 % (ref 0.0–0.2)

## 2024-06-21 LAB — COMPREHENSIVE METABOLIC PANEL WITH GFR
ALT: 21 U/L (ref 0–44)
AST: 44 U/L — ABNORMAL HIGH (ref 15–41)
Albumin: 4.2 g/dL (ref 3.5–5.0)
Alkaline Phosphatase: 68 U/L (ref 38–126)
Anion gap: 14 (ref 5–15)
BUN: 20 mg/dL (ref 6–20)
CO2: 24 mmol/L (ref 22–32)
Calcium: 9 mg/dL (ref 8.9–10.3)
Chloride: 95 mmol/L — ABNORMAL LOW (ref 98–111)
Creatinine, Ser: 1.44 mg/dL — ABNORMAL HIGH (ref 0.61–1.24)
GFR, Estimated: >60 mL/min
Glucose, Bld: 112 mg/dL — ABNORMAL HIGH (ref 70–99)
Potassium: 4.1 mmol/L (ref 3.5–5.1)
Sodium: 132 mmol/L — ABNORMAL LOW (ref 135–145)
Total Bilirubin: 1 mg/dL (ref 0.0–1.2)
Total Protein: 8.1 g/dL (ref 6.5–8.1)

## 2024-06-21 LAB — BASIC METABOLIC PANEL WITH GFR
Anion gap: 11 (ref 5–15)
BUN: 19 mg/dL (ref 6–20)
CO2: 25 mmol/L (ref 22–32)
Calcium: 8.7 mg/dL — ABNORMAL LOW (ref 8.9–10.3)
Chloride: 97 mmol/L — ABNORMAL LOW (ref 98–111)
Creatinine, Ser: 1.24 mg/dL (ref 0.61–1.24)
GFR, Estimated: >60 mL/min
Glucose, Bld: 144 mg/dL — ABNORMAL HIGH (ref 70–99)
Potassium: 4.3 mmol/L (ref 3.5–5.1)
Sodium: 133 mmol/L — ABNORMAL LOW (ref 135–145)

## 2024-06-21 LAB — MAGNESIUM: Magnesium: 2.4 mg/dL (ref 1.7–2.4)

## 2024-06-21 LAB — PRO BRAIN NATRIURETIC PEPTIDE: Pro Brain Natriuretic Peptide: 6195 pg/mL — ABNORMAL HIGH

## 2024-06-21 LAB — RESP PANEL BY RT-PCR (RSV, FLU A&B, COVID)  RVPGX2
Influenza A by PCR: POSITIVE — AB
Influenza B by PCR: NEGATIVE
Resp Syncytial Virus by PCR: NEGATIVE
SARS Coronavirus 2 by RT PCR: NEGATIVE

## 2024-06-21 LAB — TROPONIN T, HIGH SENSITIVITY
Troponin T High Sensitivity: 39 ng/L — ABNORMAL HIGH (ref 0–19)
Troponin T High Sensitivity: 46 ng/L — ABNORMAL HIGH (ref 0–19)

## 2024-06-21 LAB — GLUCOSE, CAPILLARY
Glucose-Capillary: 113 mg/dL — ABNORMAL HIGH (ref 70–99)
Glucose-Capillary: 142 mg/dL — ABNORMAL HIGH (ref 70–99)

## 2024-06-21 MED ORDER — METOPROLOL SUCCINATE ER 25 MG PO TB24
12.5000 mg | ORAL_TABLET | Freq: Every day | ORAL | Status: DC
  Administered 2024-06-21: 12.5 mg via ORAL
  Filled 2024-06-21 (×2): qty 1

## 2024-06-21 MED ORDER — FUROSEMIDE 10 MG/ML IJ SOLN
80.0000 mg | Freq: Once | INTRAMUSCULAR | Status: AC
  Administered 2024-06-21: 80 mg via INTRAVENOUS
  Filled 2024-06-21: qty 8

## 2024-06-21 MED ORDER — SACUBITRIL-VALSARTAN 24-26 MG PO TABS
1.0000 | ORAL_TABLET | Freq: Two times a day (BID) | ORAL | Status: DC
  Administered 2024-06-21 (×2): 1 via ORAL
  Filled 2024-06-21 (×3): qty 1

## 2024-06-21 MED ORDER — APIXABAN 5 MG PO TABS
5.0000 mg | ORAL_TABLET | Freq: Two times a day (BID) | ORAL | Status: DC
  Administered 2024-06-21 – 2024-06-23 (×5): 5 mg via ORAL
  Filled 2024-06-21 (×5): qty 1

## 2024-06-21 MED ORDER — SPIRONOLACTONE 25 MG PO TABS
50.0000 mg | ORAL_TABLET | Freq: Every day | ORAL | Status: DC
  Administered 2024-06-21 – 2024-06-22 (×2): 50 mg via ORAL
  Filled 2024-06-21: qty 2
  Filled 2024-06-21: qty 4

## 2024-06-21 MED ORDER — MELATONIN 3 MG PO TABS
3.0000 mg | ORAL_TABLET | Freq: Every evening | ORAL | Status: DC | PRN
  Administered 2024-06-21 – 2024-06-22 (×2): 3 mg via ORAL
  Filled 2024-06-21 (×2): qty 1

## 2024-06-21 MED ORDER — DAPAGLIFLOZIN PROPANEDIOL 10 MG PO TABS
10.0000 mg | ORAL_TABLET | Freq: Every day | ORAL | Status: DC
  Administered 2024-06-22 – 2024-06-23 (×2): 10 mg via ORAL
  Filled 2024-06-21 (×2): qty 1

## 2024-06-21 MED ORDER — DIGOXIN 125 MCG PO TABS
125.0000 ug | ORAL_TABLET | Freq: Every day | ORAL | Status: DC
  Administered 2024-06-21 – 2024-06-23 (×3): 125 ug via ORAL
  Filled 2024-06-21 (×3): qty 1

## 2024-06-21 MED ORDER — ACETAMINOPHEN 325 MG PO TABS
650.0000 mg | ORAL_TABLET | Freq: Four times a day (QID) | ORAL | Status: DC | PRN
  Administered 2024-06-21: 650 mg via ORAL
  Filled 2024-06-21: qty 2

## 2024-06-21 MED ORDER — FUROSEMIDE 10 MG/ML IJ SOLN
20.0000 mg | Freq: Once | INTRAMUSCULAR | Status: AC
  Administered 2024-06-21: 20 mg via INTRAVENOUS
  Filled 2024-06-21: qty 2

## 2024-06-21 MED ORDER — THIAMINE MONONITRATE 100 MG PO TABS
100.0000 mg | ORAL_TABLET | Freq: Every day | ORAL | Status: DC
  Administered 2024-06-21 – 2024-06-23 (×3): 100 mg via ORAL
  Filled 2024-06-21 (×3): qty 1

## 2024-06-21 MED ORDER — METOPROLOL SUCCINATE ER 25 MG PO TB24: 25.0000 mg | ORAL_TABLET | Freq: Every day | ORAL | Status: DC

## 2024-06-21 MED ORDER — OSELTAMIVIR PHOSPHATE 75 MG PO CAPS
75.0000 mg | ORAL_CAPSULE | Freq: Every day | ORAL | Status: DC
  Administered 2024-06-21 – 2024-06-23 (×3): 75 mg via ORAL
  Filled 2024-06-21 (×3): qty 1

## 2024-06-21 MED ORDER — ACETAMINOPHEN 650 MG RE SUPP: 650.0000 mg | Freq: Four times a day (QID) | RECTAL | Status: DC | PRN

## 2024-06-21 MED ORDER — TRAZODONE HCL 50 MG PO TABS
50.0000 mg | ORAL_TABLET | Freq: Once | ORAL | Status: AC
  Administered 2024-06-22: 50 mg via ORAL
  Filled 2024-06-21: qty 1

## 2024-06-21 NOTE — Progress Notes (Addendum)
 Arrived to [facility] unit room 3E 27 placed on cardiac monitoring and vitals obtained.  Educated to unit routine and plan of care.      06/21/24 1409  Vitals  Temp 97.7 F (36.5 C)  Temp Source Oral  BP (!) 82/52  MAP (mmHg) (!) 62  BP Location Right Arm  BP Method Automatic  Patient Position (if appropriate) Lying  Pulse Rate (!) 54  Pulse Rate Source Monitor  Resp 20  MEWS COLOR  MEWS Score Color Green  Oxygen Therapy  SpO2 98 %  Height and Weight  Height 5' 6 (1.676 m)  Weight 132.2 kg  Type of Scale Used Standing  BSA (Calculated - sq m) 2.48 sq meters  BMI (Calculated) 47.06  Weight in (lb) to have BMI = 25 154.6  ECG Monitoring  Telemetry Box Number [id number]  Tele Box Verification Completed by Second Verifier Completed  MEWS Score  MEWS Temp 0  MEWS Systolic 1  MEWS Pulse 0  MEWS RR 0  MEWS LOC 0  MEWS Score 1

## 2024-06-21 NOTE — Plan of Care (Signed)
 Admitted from Emergency Department.  Droplet isolation initiated.  Updated on plan of care.   Problem: Education: Goal: Knowledge of General Education information will improve Description: Including pain rating scale, medication(s)/side effects and non-pharmacologic comfort measures Outcome: Progressing   Problem: Health Behavior/Discharge Planning: Goal: Ability to manage health-related needs will improve Outcome: Progressing   Problem: Clinical Measurements: Goal: Ability to maintain clinical measurements within normal limits will improve Outcome: Progressing Goal: Will remain free from infection Outcome: Progressing Goal: Diagnostic test results will improve Outcome: Progressing

## 2024-06-21 NOTE — Progress Notes (Addendum)
 " PROGRESS NOTE    Kyle Pearson  FMW:[id number] DOB: [birth year] DOA: 06/20/2024 PCP: Kayla Jeoffrey RAMAN, FNP  47/M with history of alcohol and tobacco use, systolic CHF, cirrhosis, ICD, obesity, atrial flutter on Eliquis , iron  deficiency anemia, suspected OSA was seen in heart failure clinic last week and felt to be stable, presented to the ED with weakness cough and fever, mild shortness of breath. - In the ED temp was 99 3, required 2 L O2, labs with creatinine 1.4, proBNP 6195, respiratory virus panel positive for flu, chest x-ray notable atelectasis -Admitted overnight, given a dose of IV Lasix   Subjective: Feels better, breathing close to normal, has a cough  Assessment and Plan:  Influenza A - Start Tamiflu , supportive care  Chronic systolic CHF -Last echo 11/25 with EF less than 20%, mildly reduced RV - Clinically does not appear volume overloaded to me, however morbidly obese - Weight 309 at discharge in late November, follow-up in clinic last week weight was 295, 295 again today in ER - Lasix  X1, resume torsemide  from tomorrow - Continue Farxiga , Aldactone , digoxin  and Entresto  - Blood pressure soft at times, patient reports dizziness at home routinely - Home tomorrow if stable  Paroxysmal atrial flutter - Currently in sinus rhythm, continue amiodarone  and Eliquis   EtOH use History of cirrhosis - Diuretics as above, allegedly quit  OSA - Awaiting CPAP delivery  Morbid obesity On Mounjaro   DVT prophylaxis: Eliquis  Code Status: Full code Family Communication: None present Disposition Plan: Home tomorrow  Consultants:    Procedures:   Antimicrobials:    Objective: Vitals:   06/21/24 0615 06/21/24 0619 06/21/24 0931 06/21/24 0933  BP: 103/64  115/81   Pulse: 65  62   Resp: 15  15   Temp:  97.6 F (36.4 C)  97.7 F (36.5 C)  TempSrc:  Oral  Oral  SpO2: 96%  92%   Weight:      Height:        Intake/Output Summary (Last 24 hours) at 06/21/2024  1000 Last data filed at 06/21/2024 9365 Gross per 24 hour  Intake --  Output 2250 ml  Net -2250 ml   Filed Weights   06/20/24 2257  Weight: 133.8 kg    Examination:  General exam: He is chronically ill, AO x 3, morbidly obese HEENT: Neck obese unable to assess JVD Respiratory system: Few scattered rhonchi at the bases Cardiovascular system: S1 & S2 heard, RRR.  Abd: nondistended, soft and nontender.Normal bowel sounds heard. Central nervous system: Alert and oriented. No focal neurological deficits. Extremities: no edema Skin: No rashes Psychiatry:  Mood & affect appropriate.     Data Reviewed:   CBC: Recent Labs  Lab 06/20/24 2312  WBC 7.9  NEUTROABS 6.6  HGB 14.1  HCT 43.4  MCV 81.0  PLT 208   Basic Metabolic Panel: Recent Labs  Lab 06/14/24 1457 06/20/24 2312 06/21/24 0513  NA 136 132* 133*  K 4.0 4.1 4.3  CL 100 95* 97*  CO2 31 24 25   GLUCOSE 90 112* 144*  BUN 12 20 19   CREATININE 1.01 1.44* 1.24  CALCIUM  9.5 9.0 8.7*  MG  --   --  2.4   GFR: Estimated Creatinine Clearance: 95.6 mL/min (by C-G formula based on SCr of 1.24 mg/dL). Liver Function Tests: Recent Labs  Lab 06/14/24 1457 06/20/24 2312  AST 24 44*  ALT 16 21  ALKPHOS 63 68  BILITOT 1.6* 1.0  PROT 7.7 8.1  ALBUMIN 3.8 4.2  No results for input(s): LIPASE, AMYLASE in the last 168 hours. No results for input(s): AMMONIA in the last 168 hours. Coagulation Profile: Recent Labs  Lab 06/20/24 2312  INR 1.4*   Cardiac Enzymes: No results for input(s): CKTOTAL, CKMB, CKMBINDEX, TROPONINI in the last 168 hours. BNP (last 3 results) Recent Labs    06/20/24 2312  PROBNP 6,195.0*   HbA1C: No results for input(s): HGBA1C in the last 72 hours. CBG: No results for input(s): GLUCAP in the last 168 hours. Lipid Profile: No results for input(s): CHOL, HDL, LDLCALC, TRIG, CHOLHDL, LDLDIRECT in the last 72 hours. Thyroid  Function Tests: No results for  input(s): TSH, T4TOTAL, FREET4, T3FREE, THYROIDAB in the last 72 hours. Anemia Panel: No results for input(s): VITAMINB12, FOLATE, FERRITIN, TIBC, IRON , RETICCTPCT in the last 72 hours. Urine analysis:    Component Value Date/Time   COLORURINE YELLOW 04/27/2024 1215   APPEARANCEUR CLEAR 04/27/2024 1215   LABSPEC 1.015 04/27/2024 1215   PHURINE 6.5 04/27/2024 1215   GLUCOSEU 3+ (A) 04/27/2024 1215   HGBUR TRACE (A) 04/27/2024 1215   BILIRUBINUR NEGATIVE 03/13/2007 1222   KETONESUR NEGATIVE 04/27/2024 1215   PROTEINUR 3+ (A) 04/27/2024 1215   UROBILINOGEN 0.2 03/13/2007 1222   NITRITE NEGATIVE 04/27/2024 1215   LEUKOCYTESUR NEGATIVE 04/27/2024 1215   Sepsis Labs: @LABRCNTIP (procalcitonin:4,lacticidven:4)  ) Recent Results (from the past 240 hours)  Resp panel by RT-PCR (RSV, Flu A&B, Covid) Anterior Nasal Swab     Status: Abnormal   Collection Time: 06/20/24 11:12 PM   Specimen: Anterior Nasal Swab  Result Value Ref Range Status   SARS Coronavirus 2 by RT PCR NEGATIVE NEGATIVE Final   Influenza A by PCR POSITIVE (A) NEGATIVE Final   Influenza B by PCR NEGATIVE NEGATIVE Final    Comment: (NOTE) The Xpert Xpress SARS-CoV-2/FLU/RSV plus assay is intended as an aid in the diagnosis of influenza from Nasopharyngeal swab specimens and should not be used as a sole basis for treatment. Nasal washings and aspirates are unacceptable for Xpert Xpress SARS-CoV-2/FLU/RSV testing.  Fact Sheet for Patients: bloggercourse.com  Fact Sheet for Healthcare Providers: seriousbroker.it  This test is not yet approved or cleared by the [place]  FDA and has been authorized for detection and/or diagnosis of SARS-CoV-2 by FDA under an Emergency Use Authorization (EUA). This EUA will remain in effect (meaning this test can be used) for the duration of the COVID-19 declaration under Section 564(b)(1) of the Act, 21  U.S.C. section 360bbb-3(b)(1), unless the authorization is terminated or revoked.     Resp Syncytial Virus by PCR NEGATIVE NEGATIVE Final    Comment: (NOTE) Fact Sheet for Patients: bloggercourse.com  Fact Sheet for Healthcare Providers: seriousbroker.it  This test is not yet approved or cleared by the [place]  FDA and has been authorized for detection and/or diagnosis of SARS-CoV-2 by FDA under an Emergency Use Authorization (EUA). This EUA will remain in effect (meaning this test can be used) for the duration of the COVID-19 declaration under Section 564(b)(1) of the Act, 21 U.S.C. section 360bbb-3(b)(1), unless the authorization is terminated or revoked.  Performed at [facility], [address] [place], [place] [address]      Radiology Studies: DG Chest 1 View Result Date: 06/20/2024 CLINICAL DATA:  Shortness of breath EXAM: PORTABLE CHEST 1 VIEW COMPARISON:  05/04/2024 FINDINGS: Cardiac shadow is stable. Defibrillator is again noted and stable. Previously seen right PICC has been removed in the interval. Patchy bibasilar atelectasis is noted. IMPRESSION: Patchy bibasilar  atelectasis. Electronically Signed   By: Oneil Devonshire M.D.   On: 06/20/2024 23:44     Scheduled Meds:  apixaban   5 mg Oral BID   digoxin   125 mcg Oral Daily   metoprolol  succinate  12.5 mg Oral Daily   oseltamivir   75 mg Oral Daily   sacubitril -valsartan   1 tablet Oral BID   spironolactone   50 mg Oral Daily   thiamine   100 mg Oral Daily   Continuous Infusions:   LOS: 0 days    Time spent:    Sigurd Pac, MD [facility]   06/21/2024, 10:00 AM    "

## 2024-06-21 NOTE — TOC CM/SW Note (Signed)
 Transition of Care [facility]) - Inpatient Brief Assessment   Patient Details  Name: Kyle Pearson MRN: [id number] Date of Birth: 22-Apr-1976  Transition of Care [facility]) CM/SW Contact:    Waddell Barnie Rama, RN Phone Number: 06/21/2024, 4:35 PM   Clinical Narrative: From home with spouse, has PCP and insurance on file, states has no HH services in place at this time or DME at home.  States family member  or friend will transport them home at [employer] and family is support system, states gets medications from [facility] on [address].  Pta self ambulatory.   There are no ICM needs identified  at this time.  Please place consult for ICM needs.     Transition of Care Asessment: Insurance and Status: Insurance coverage has been reviewed Patient has primary care physician: Yes Home environment has been reviewed: home with wife Prior level of function:: indep Prior/Current Home Services: No current home services Social Drivers of Health Review: SDOH reviewed no interventions necessary Readmission risk has been reviewed: Yes Transition of care needs: no transition of care needs at this time

## 2024-06-21 NOTE — ED Notes (Signed)
Patient resting in bed, easily aroused

## 2024-06-21 NOTE — ED Notes (Signed)
 Bedside urinal contents disposed of 750 mL.

## 2024-06-21 NOTE — ED Notes (Signed)
 Patient asleep easily aroused. Acknowledged blood draw and medication administration.

## 2024-06-21 NOTE — ED Notes (Signed)
 Patient resting easily aroused.

## 2024-06-21 NOTE — Progress Notes (Signed)
 Heart Failure Navigator Progress Note  Assessed for Heart & Vascular TOC clinic readiness.  Patient does not meet criteria due to he is an Advanced Heart Failure Team patient of Dr. Rolan. .   Navigator will sign off at this time.   Stephane Haddock, BSN, Scientist, clinical (histocompatibility and immunogenetics) Only

## 2024-06-21 NOTE — ED Notes (Signed)
 CCMD called; Pt. Is on monitor

## 2024-06-21 NOTE — H&P (Signed)
 " History and Physical    Kyle Pearson FMW:[id number] DOB: [birth year] DOA: 06/20/2024  PCP: Kayla Jeoffrey RAMAN, FNP   Chief Complaint:  sob  HPI: Kyle Pearson is a 48 y.o. male with medical history significant of CHF, type 2 diabetes, cirrhosis, A-flutter on Eliquis  who presents emerged primary shortness of breath.  Patient ran out of his diuretic medications and started to develop heart palpitations lightheadedness and shortness of breath.  He presents emergency department where he was found to be [address] Brown hemodynamically stable.  He had new oxygen requirement.  He was just discharged from the hospital on 11/12.  During his presentation he presented with cardiogenic shock found to have an EF less than 20%.  Labs were obtained on presentation which showed troponin 46, 39, creatinine 1.4 baseline between 1 and 2, BNP 6195, INR 1.4, respiratory viral panel positive for flu.  Chest x-ray showed atelectasis.  Patient was given IV diuresis and admitted for further workup.  On admission he was resting on the side of the bed with dyspnea.  His legs were swollen he had crackles in his lungs.   Review of Systems: Review of Systems  Constitutional:  Positive for malaise/fatigue. Negative for chills and fever.  Eyes: Negative.   Respiratory: Negative.    Cardiovascular:  Positive for palpitations, orthopnea and leg swelling.  Gastrointestinal:  Positive for melena.  Genitourinary: Negative.   Musculoskeletal: Negative.   Neurological: Negative.   Endo/Heme/Allergies: Negative.   Psychiatric/Behavioral: Negative.    All other systems reviewed and are negative.    As per HPI otherwise 10 point review of systems negative.   Allergies[1]  Past Medical History:  Diagnosis Date   Atrial flutter (HCC)    CHF (congestive heart failure) (HCC)    OSA (obstructive sleep apnea)    Pacemaker    Pulmonary hypertensive venous disease (HCC)    Type 2 diabetes mellitus with hyperglycemia, without  long-term current use of insulin  [facility])     Past Surgical History:  Procedure Laterality Date   CARDIOVERSION N/A 05/10/2024   Procedure: CARDIOVERSION;  Surgeon: Rolan Ezra RAMAN, MD;  Location: [facility] INVASIVE CV LAB;  Service: Cardiovascular;  Laterality: N/A;   RIGHT HEART CATH N/A 12/08/2020   Procedure: RIGHT HEART CATH;  Surgeon: Rolan Ezra RAMAN, MD;  Location: [facility] INVASIVE CV LAB;  Service: Cardiovascular;  Laterality: N/A;   RIGHT/LEFT HEART CATH AND CORONARY ANGIOGRAPHY N/A 06/01/2020   Procedure: RIGHT/LEFT HEART CATH AND CORONARY ANGIOGRAPHY;  Surgeon: Rolan Ezra RAMAN, MD;  Location: [facility] INVASIVE CV LAB;  Service: Cardiovascular;  Laterality: N/A;   SUBQ ICD IMPLANT N/A 01/26/2021   Procedure: SUBQ ICD IMPLANT;  Surgeon: Inocencio Soyla Lunger, MD;  Location: [facility] INVASIVE CV LAB;  Service: Cardiovascular;  Laterality: N/A;   TRANSESOPHAGEAL ECHOCARDIOGRAM (CATH LAB) N/A 05/10/2024   Procedure: TRANSESOPHAGEAL ECHOCARDIOGRAM;  Surgeon: Rolan Ezra RAMAN, MD;  Location: [facility] INVASIVE CV LAB;  Service: Cardiovascular;  Laterality: N/A;     reports that he quit smoking about 5 years ago. His smoking use included cigarettes. He has never used smokeless tobacco. He reports that he does not currently use alcohol. He reports that he does not currently use drugs.  Family History  Problem Relation Age of Onset   Diabetes Mother    Stroke Father    Diabetes Sister     Prior to Admission medications  Medication Sig Start Date End Date Taking? Authorizing Provider  apixaban  (ELIQUIS ) 5 MG TABS tablet Take 1 tablet (5  mg total) by mouth 2 (two) times daily. 06/14/24  Yes Milford, Harlene HERO, FNP  Cholecalciferol (VITAMIN D3) 50 MCG (2000 UT) capsule Take 1 capsule (2,000 Units total) by mouth daily. 04/27/24  Yes Howard, Amber S, FNP  digoxin  (LANOXIN ) 0.125 MG tablet Take 1 tablet (125 mcg total) by mouth daily. 06/14/24  Yes Milford, Harlene HERO, FNP  FARXIGA  10 MG TABS tablet Take 1 tablet (10 mg  total) by mouth daily before breakfast. 02/21/24  Yes Weaver, Scott T, PA-C  Iron , Ferrous Sulfate , 325 (65 Fe) MG TABS Take 325 mg by mouth every other day. 04/27/24  Yes Kayla Jeoffrey RAMAN, FNP  metoprolol  succinate (TOPROL  XL) 25 MG 24 hr tablet Take 1 tablet (25 mg total) by mouth daily. 06/14/24  Yes Milford, Harlene HERO, FNP  potassium chloride  SA (KLOR-CON  M20) 20 MEQ tablet Take 3 tablets (60 mEq total) by mouth daily. Patient taking differently: Take 40 mEq by mouth daily. 05/24/24  Yes Milford, Harlene HERO, FNP  sacubitril -valsartan  (ENTRESTO ) 24-26 MG Take 1 tablet by mouth 2 (two) times daily. 06/14/24  Yes Milford, Harlene HERO, FNP  spironolactone  (ALDACTONE ) 50 MG tablet Take 1 tablet (50 mg total) by mouth daily. 06/14/24  Yes Milford, Harlene HERO, FNP  thiamine  (VITAMIN B1) 100 MG tablet Take 1 tablet (100 mg total) by mouth daily. 05/12/24  Yes Tobie Yetta HERO, MD  tirzepatide  (MOUNJARO ) 2.5 MG/0.5ML Pen Inject 2.5 mg into the skin once a week. 05/19/24  Yes Howard, Amber S, FNP  torsemide  (DEMADEX ) 20 MG tablet Take 3 tablets (60 mg total) by mouth daily. 05/24/24  Yes Milford, Harlene HERO, FNP  amiodarone  (PACERONE ) 200 MG tablet Take 1 tablet (200 mg total) by mouth daily. Patient not taking: Reported on 06/20/2024 05/19/24 06/18/24  Kayla Jeoffrey RAMAN, FNP  bisacodyl  (DULCOLAX) 5 MG EC tablet Take 5 mg by mouth daily as needed for mild constipation. Patient not taking: Reported on 06/20/2024    [provider]    Physical Exam: Vitals:   06/20/24 2350 06/21/24 0000 06/21/24 0045 06/21/24 0204  BP: 106/71 121/79 116/73 102/70  Pulse: 85 84 77 76  Resp:  20 (!) 22 16  Temp:    98.3 F (36.8 C)  TempSrc:    Oral  SpO2: 95% 95% 95% 98%  Weight:      Height:       Physical Exam Constitutional:      Appearance: He is normal weight.  Eyes:     Pupils: Pupils are equal, round, and reactive to light.  Cardiovascular:     Rate and Rhythm: Normal rate and regular rhythm.   Pulmonary:     Effort: Pulmonary effort is normal.     Breath sounds: Normal breath sounds.  Abdominal:     Palpations: Abdomen is soft.  Musculoskeletal:        General: Normal range of motion.     Cervical back: Normal range of motion.  Skin:    General: Skin is warm.     Capillary Refill: Capillary refill takes less than 2 seconds.  Neurological:     General: No focal deficit present.     Mental Status: He is alert.        Labs on Admission: I have personally reviewed the patients's labs and imaging studies.  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 Inpatient status is judged to be reasonable and necessary in order to provide the required intensity of service to ensure the patient's safety. The patient's presenting symptoms, physical exam findings, and initial radiographic and laboratory data in the context of their chronic comorbidities is felt to place them at high risk for further clinical deterioration. Furthermore, it is not anticipated that the patient will be medically stable for discharge from the hospital within 2 midnights of admission.   * I certify that at the point of admission it is my clinical judgment that the patient will require inpatient hospital care spanning beyond 2 midnights from the point of admission due  to high intensity of service, high risk for further deterioration and high frequency of surveillance required.DEWAINE Lamar Dess MD [facility] If 7PM-7AM, please contact night-coverage [email or web address]  06/21/2024, 4:30 AM        [1]  Allergies Allergen Reactions   Raspberry Anaphylaxis   Penicillins Rash    Reaction: Childhood   "

## 2024-06-21 NOTE — Progress Notes (Signed)
 MEWS Progress Note  Patient Details Name: Kyle Pearson MRN: [id number] DOB: 1975-07-12 Today's Date: 06/21/2024   MEWS Flowsheet Documentation:  Assess: MEWS Score Temp: 97.8 F (36.6 C) BP: (!) 80/57 MAP (mmHg): 65 Pulse Rate: (!) 58 ECG Heart Rate: (!) 58 Resp: 20 SpO2: 96 % O2 Device: Room Air O2 Flow Rate (L/min): 2 L/min Assess: MEWS Score MEWS Temp: 0 MEWS Systolic: 2 MEWS Pulse: 0 MEWS RR: 0 MEWS LOC: 0 MEWS Score: 2 MEWS Score Color: Yellow Assess: SIRS CRITERIA SIRS Temperature : 0 SIRS Respirations : 0 SIRS Pulse: 0 SIRS WBC: 0 SIRS Score Sum : 0 Assess: if the MEWS score is Yellow or Red Were vital signs accurate and taken at a resting state?: Yes Does the patient meet 2 or more of the SIRS criteria?: No MEWS guidelines implemented : Yes, yellow Treat MEWS Interventions: Considered administering scheduled or prn medications/treatments as ordered Take Vital Signs Increase Vital Sign Frequency : Yellow: Q2hr x1, continue Q4hrs until patient remains green for 12hrs Escalate MEWS: Escalate: Yellow: Discuss with charge nurse and consider notifying provider and/or RRT

## 2024-06-22 ENCOUNTER — Ambulatory Visit: Admitting: Gastroenterology

## 2024-06-22 LAB — BASIC METABOLIC PANEL WITH GFR
Anion gap: 11 (ref 5–15)
BUN: 31 mg/dL — ABNORMAL HIGH (ref 6–20)
CO2: 25 mmol/L (ref 22–32)
Calcium: 8.8 mg/dL — ABNORMAL LOW (ref 8.9–10.3)
Chloride: 95 mmol/L — ABNORMAL LOW (ref 98–111)
Creatinine, Ser: 1.11 mg/dL (ref 0.61–1.24)
GFR, Estimated: >60 mL/min
Glucose, Bld: 124 mg/dL — ABNORMAL HIGH (ref 70–99)
Potassium: 4.3 mmol/L (ref 3.5–5.1)
Sodium: 132 mmol/L — ABNORMAL LOW (ref 135–145)

## 2024-06-22 LAB — LACTIC ACID, PLASMA: Lactic Acid, Venous: 1.6 mmol/L (ref 0.5–1.9)

## 2024-06-22 MED ORDER — SPIRONOLACTONE 25 MG PO TABS
25.0000 mg | ORAL_TABLET | Freq: Every day | ORAL | Status: DC
  Administered 2024-06-23: 25 mg via ORAL
  Filled 2024-06-22: qty 1

## 2024-06-22 MED ORDER — BENZONATATE 100 MG PO CAPS
100.0000 mg | ORAL_CAPSULE | Freq: Three times a day (TID) | ORAL | Status: DC | PRN
  Administered 2024-06-22: 100 mg via ORAL
  Filled 2024-06-22: qty 1

## 2024-06-22 NOTE — Progress Notes (Signed)
 " PROGRESS NOTE    Kyle Pearson  FMW:[id number] DOB: [birth year] DOA: 06/20/2024 PCP: Kayla Jeoffrey RAMAN, FNP  47/M with history of alcohol and tobacco use, systolic CHF, cirrhosis, ICD, obesity, atrial flutter on Eliquis , iron  deficiency anemia, suspected OSA was seen in heart failure clinic last week and felt to be stable, presented to the ED with weakness cough and fever, mild shortness of breath. - In the ED temp was 99 3, required 2 L O2, labs with creatinine 1.4, proBNP 6195, respiratory virus panel positive for flu, chest x-ray notable atelectasis -Admitted overnight, given a dose of IV Lasix   Subjective: Breathing better overall, close to baseline, still has a cough  Assessment and Plan:  Influenza A - Continue Tamiflu , day 2  Chronic systolic CHF -Last echo 11/25 with EF less than 20%, mildly reduced RV - Clinically does not appear volume overloaded to me, however morbidly obese - Weight 309 at discharge in late November, follow-up in clinic last week weight was 295, 291 now - Appears euvolemic, resume torsemide  tomorrow -BP soft in the 80s to 90s range now, hold Entresto , Toprol  today, decreased Aldactone  - Continue Farxiga , digoxin  - Home tomorrow if stable - Will need close follow-up in heart failure clinic  Paroxysmal atrial flutter - Currently in sinus rhythm, continue amiodarone  and Eliquis   EtOH use History of cirrhosis - Diuretics as above, allegedly quit  OSA - Awaiting CPAP delivery  Morbid obesity On Mounjaro   DVT prophylaxis: Eliquis  Code Status: Full code Family Communication: None present Disposition Plan: Home tomorrow  Consultants:    Procedures:   Antimicrobials:    Objective: Vitals:   06/21/24 1938 06/21/24 2335 06/22/24 0437 06/22/24 0800  BP: 106/72 119/67 (!) 87/63   Pulse: 71 64 (!) 51   Resp: 20 18 19 16   Temp: 97.8 F (36.6 C) (!) 97.3 F (36.3 C) (!) 97.5 F (36.4 C) (!) 97.5 F (36.4 C)  TempSrc: Oral Temporal Oral  Oral  SpO2: 95% 97% 95%   Weight:      Height:        Intake/Output Summary (Last 24 hours) at 06/22/2024 1209 Last data filed at 06/22/2024 0900 Gross per 24 hour  Intake 1374 ml  Output 1350 ml  Net 24 ml   Filed Weights   06/20/24 2257 06/21/24 1409  Weight: 133.8 kg 132.2 kg    Examination:  General exam: He is chronically ill, AO x 3, morbidly obese, no distress HEENT: Neck obese unable to assess JVD Respiratory system: Few scattered rhonchi at the bases Cardiovascular system: S1 & S2 heard, RRR.  Abd: nondistended, soft and nontender.Normal bowel sounds heard. Central nervous system: Alert and oriented. No focal neurological deficits. Extremities: no edema Skin: No rashes Psychiatry:  Mood & affect appropriate.     Data Reviewed:   CBC: Recent Labs  Lab 06/20/24 2312  WBC 7.9  NEUTROABS 6.6  HGB 14.1  HCT 43.4  MCV 81.0  PLT 208   Basic Metabolic Panel: Recent Labs  Lab 06/20/24 2312 06/21/24 0513 06/22/24 0259  NA 132* 133* 132*  K 4.1 4.3 4.3  CL 95* 97* 95*  CO2 24 25 25   GLUCOSE 112* 144* 124*  BUN 20 19 31*  CREATININE 1.44* 1.24 1.11  CALCIUM  9.0 8.7* 8.8*  MG  --  2.4  --    GFR: Estimated Creatinine Clearance: 106.1 mL/min (by C-G formula based on SCr of 1.11 mg/dL). Liver Function Tests: Recent Labs  Lab 06/20/24 2312  AST  44*  ALT 21  ALKPHOS 68  BILITOT 1.0  PROT 8.1  ALBUMIN 4.2   No results for input(s): LIPASE, AMYLASE in the last 168 hours. No results for input(s): AMMONIA in the last 168 hours. Coagulation Profile: Recent Labs  Lab 06/20/24 2312  INR 1.4*   Cardiac Enzymes: No results for input(s): CKTOTAL, CKMB, CKMBINDEX, TROPONINI in the last 168 hours. BNP (last 3 results) Recent Labs    06/20/24 2312  PROBNP 6,195.0*   HbA1C: No results for input(s): HGBA1C in the last 72 hours. CBG: Recent Labs  Lab 06/21/24 1548 06/21/24 2126  GLUCAP 113* 142*   Lipid Profile: No results  for input(s): CHOL, HDL, LDLCALC, TRIG, CHOLHDL, LDLDIRECT in the last 72 hours. Thyroid  Function Tests: No results for input(s): TSH, T4TOTAL, FREET4, T3FREE, THYROIDAB in the last 72 hours. Anemia Panel: No results for input(s): VITAMINB12, FOLATE, FERRITIN, TIBC, IRON , RETICCTPCT in the last 72 hours. Urine analysis:    Component Value Date/Time   COLORURINE YELLOW 04/27/2024 1215   APPEARANCEUR CLEAR 04/27/2024 1215   LABSPEC 1.015 04/27/2024 1215   PHURINE 6.5 04/27/2024 1215   GLUCOSEU 3+ (A) 04/27/2024 1215   HGBUR TRACE (A) 04/27/2024 1215   BILIRUBINUR NEGATIVE 03/13/2007 1222   KETONESUR NEGATIVE 04/27/2024 1215   PROTEINUR 3+ (A) 04/27/2024 1215   UROBILINOGEN 0.2 03/13/2007 1222   NITRITE NEGATIVE 04/27/2024 1215   LEUKOCYTESUR NEGATIVE 04/27/2024 1215   Sepsis Labs: @LABRCNTIP (procalcitonin:4,lacticidven:4)  ) Recent Results (from the past 240 hours)  Resp panel by RT-PCR (RSV, Flu A&B, Covid) Anterior Nasal Swab     Status: Abnormal   Collection Time: 06/20/24 11:12 PM   Specimen: Anterior Nasal Swab  Result Value Ref Range Status   SARS Coronavirus 2 by RT PCR NEGATIVE NEGATIVE Final   Influenza A by PCR POSITIVE (A) NEGATIVE Final   Influenza B by PCR NEGATIVE NEGATIVE Final    Comment: (NOTE) The Xpert Xpress SARS-CoV-2/FLU/RSV plus assay is intended as an aid in the diagnosis of influenza from Nasopharyngeal swab specimens and should not be used as a sole basis for treatment. Nasal washings and aspirates are unacceptable for Xpert Xpress SARS-CoV-2/FLU/RSV testing.  Fact Sheet for Patients: bloggercourse.com  Fact Sheet for Healthcare Providers: seriousbroker.it  This test is not yet approved or cleared by the [place]  FDA and has been authorized for detection and/or diagnosis of SARS-CoV-2 by FDA under an Emergency Use Authorization (EUA). This EUA will remain in  effect (meaning this test can be used) for the duration of the COVID-19 declaration under Section 564(b)(1) of the Act, 21 U.S.C. section 360bbb-3(b)(1), unless the authorization is terminated or revoked.     Resp Syncytial Virus by PCR NEGATIVE NEGATIVE Final    Comment: (NOTE) Fact Sheet for Patients: bloggercourse.com  Fact Sheet for Healthcare Providers: seriousbroker.it  This test is not yet approved or cleared by the [place]  FDA and has been authorized for detection and/or diagnosis of SARS-CoV-2 by FDA under an Emergency Use Authorization (EUA). This EUA will remain in effect (meaning this test can be used) for the duration of the COVID-19 declaration under Section 564(b)(1) of the Act, 21 U.S.C. section 360bbb-3(b)(1), unless the authorization is terminated or revoked.  Performed at [facility], [address], [place], [place] [address]      Radiology Studies: DG Chest 1 View Result Date: 06/20/2024 CLINICAL DATA:  Shortness of breath EXAM: PORTABLE CHEST 1 VIEW COMPARISON:  05/04/2024 FINDINGS: Cardiac shadow is stable. Defibrillator is  again noted and stable. Previously seen right PICC has been removed in the interval. Patchy bibasilar atelectasis is noted. IMPRESSION: Patchy bibasilar atelectasis. Electronically Signed   By: Oneil Devonshire M.D.   On: 06/20/2024 23:44     Scheduled Meds:  apixaban   5 mg Oral BID   dapagliflozin  propanediol  10 mg Oral QAC breakfast   digoxin   125 mcg Oral Daily   oseltamivir   75 mg Oral Daily   [START ON 06/23/2024] spironolactone   25 mg Oral Daily   thiamine   100 mg Oral Daily   Continuous Infusions:   LOS: 1 day    Time spent:    Sigurd Pac, MD [facility]   06/22/2024, 12:09 PM    "

## 2024-06-22 NOTE — Plan of Care (Signed)
?  Problem: Clinical Measurements: ?Goal: Ability to maintain clinical measurements within normal limits will improve ?Outcome: Progressing ?Goal: Diagnostic test results will improve ?Outcome: Progressing ?  ?Problem: Nutrition: ?Goal: Adequate nutrition will be maintained ?Outcome: Progressing ?  ?Problem: Safety: ?Goal: Ability to remain free from injury will improve ?Outcome: Progressing ?  ?

## 2024-06-23 ENCOUNTER — Other Ambulatory Visit (HOSPITAL_COMMUNITY): Payer: Self-pay

## 2024-06-23 DIAGNOSIS — I4892 Unspecified atrial flutter: Secondary | ICD-10-CM

## 2024-06-23 DIAGNOSIS — K7469 Other cirrhosis of liver: Secondary | ICD-10-CM

## 2024-06-23 DIAGNOSIS — E66813 Obesity, class 3: Secondary | ICD-10-CM

## 2024-06-23 DIAGNOSIS — D649 Anemia, unspecified: Secondary | ICD-10-CM

## 2024-06-23 DIAGNOSIS — J101 Influenza due to other identified influenza virus with other respiratory manifestations: Secondary | ICD-10-CM

## 2024-06-23 DIAGNOSIS — E1165 Type 2 diabetes mellitus with hyperglycemia: Secondary | ICD-10-CM

## 2024-06-23 DIAGNOSIS — G4733 Obstructive sleep apnea (adult) (pediatric): Secondary | ICD-10-CM

## 2024-06-23 DIAGNOSIS — N179 Acute kidney failure, unspecified: Secondary | ICD-10-CM

## 2024-06-23 LAB — BASIC METABOLIC PANEL WITH GFR
Anion gap: 10 (ref 5–15)
BUN: 27 mg/dL — ABNORMAL HIGH (ref 6–20)
CO2: 26 mmol/L (ref 22–32)
Calcium: 8.8 mg/dL — ABNORMAL LOW (ref 8.9–10.3)
Chloride: 97 mmol/L — ABNORMAL LOW (ref 98–111)
Creatinine, Ser: 0.91 mg/dL (ref 0.61–1.24)
GFR, Estimated: >60 mL/min
Glucose, Bld: 99 mg/dL (ref 70–99)
Potassium: 4.1 mmol/L (ref 3.5–5.1)
Sodium: 132 mmol/L — ABNORMAL LOW (ref 135–145)

## 2024-06-23 LAB — CBC
HCT: 43.2 % (ref 39.0–52.0)
Hemoglobin: 14.1 g/dL (ref 13.0–17.0)
MCH: 26.1 pg (ref 26.0–34.0)
MCHC: 32.6 g/dL (ref 30.0–36.0)
MCV: 79.9 fL — ABNORMAL LOW (ref 80.0–100.0)
Platelets: 245 K/uL (ref 150–400)
RBC: 5.41 MIL/uL (ref 4.22–5.81)
RDW: 22.2 % — ABNORMAL HIGH (ref 11.5–15.5)
WBC: 8.3 K/uL (ref 4.0–10.5)
nRBC: 0 % (ref 0.0–0.2)

## 2024-06-23 MED ORDER — OSELTAMIVIR PHOSPHATE 75 MG PO CAPS
75.0000 mg | ORAL_CAPSULE | Freq: Every day | ORAL | 0 refills | Status: AC
  Filled 2024-06-23: qty 2, 2d supply, fill #0

## 2024-06-23 MED ORDER — POTASSIUM CHLORIDE CRYS ER 20 MEQ PO TBCR
60.0000 meq | EXTENDED_RELEASE_TABLET | Freq: Every day | ORAL | 0 refills | Status: AC
  Filled 2024-06-23: qty 270, 90d supply, fill #0

## 2024-06-23 MED ORDER — OSELTAMIVIR PHOSPHATE 75 MG PO CAPS
75.0000 mg | ORAL_CAPSULE | Freq: Every day | ORAL | 0 refills | Status: DC
  Filled 2024-06-23: qty 5, 5d supply, fill #0

## 2024-06-23 MED ORDER — TORSEMIDE 20 MG PO TABS
60.0000 mg | ORAL_TABLET | Freq: Every day | ORAL | Status: DC
  Administered 2024-06-23: 60 mg via ORAL
  Filled 2024-06-23: qty 3

## 2024-06-23 MED ORDER — TORSEMIDE 20 MG PO TABS
60.0000 mg | ORAL_TABLET | Freq: Every day | ORAL | 0 refills | Status: AC
  Filled 2024-06-23: qty 270, 90d supply, fill #0

## 2024-06-23 MED ORDER — FARXIGA 10 MG PO TABS
10.0000 mg | ORAL_TABLET | Freq: Every day | ORAL | 0 refills | Status: AC
  Filled 2024-06-23: qty 90, 90d supply, fill #0

## 2024-06-23 NOTE — Discharge Summary (Signed)
 " Physician Discharge Summary   Patient: Kyle Pearson MRN: [id number] DOB: 1975-09-05  Admit date:     06/20/2024  Discharge date: 06/23/2024  Discharge Physician: Elidia Sieving Kaylea Mounsey   PCP: Kayla Jeoffrey RAMAN, FNP   Recommendations at discharge:    Patient will complete 5 days of Oseltamivir  Refilled torsemide  and SGLT 2 inh, along with K supplementation.  Continue heart failure guideline directed medical therapy with entresto , spironolactone , digoxin  and SGLT 2 inh Follow up renal function and electrolytes in 7 days as outpatient  Follow up with Jeoffrey Kayla FNP in 7 to 10 days as outpatient Follow up with Cardiology as scheduled.   Discharge Diagnoses: Principal Problem:   Acute exacerbation of CHF (congestive heart failure) (HCC)  Resolved Problems:   * No resolved hospital problems. [facility] Course: Mr. Karolee was admitted to the hospital with the working diagnosis of congested heart failure exacerbation in the setting of medical non compliance and influenza   48 yo male with history of alcohol and tobacco use, heart failure, cirrhosis, ICD, obesity, atrial flutter, iron  deficiency anemia, and OSA who presented with dyspnea.  Patient run out of torsemide  few days prior to admission, at home he developed dyspnea, palpitations and lightheadedness. He reported cough, and fever for the last 5 days prior to admission.  Recent hospitalization 11/3 to 05/12/24 for heart failure exacerbation, with cardiogenic shock requiring inotropic support while inpatient.  On his initial physical examination his blood pressure was 106/71, HR 85, RR 22 and 02 saturation 95% Lungs with no wheezing or rhonchi, heart with S1 and S2 present and regular, abdomen soft and non tender, trace lower extremity edema.   Na 132, K 4.1 Cl 95 bicarbonate 24 glucose 112, bun 20 cr 1,44 AST 44 ALT 21 BNP 6,195  High sensitive troponin 46 and 39  Wbc 7,9 hgb 14.1 plt 208  INR 1,4   Influenza A  positive Influenza B negative RSV negative COVID 19 negative   Chest radiograph with hypoinflation, positive cardiomegaly, bilateral hilar vascular congestion, bilateral central interstitial infiltrates, more predominant at the lower lobes.   EKG 94 bpm, left axis deviation, normal intervals, qtc 462, sinus rhythm with left atrial enlargement, no significant ST segment or T wave changes, poor RR wave progression.   Patient was placed on IV furosemide  and oral oseltamivir .   12/24 improved volume status.  Plan to follow up as outpatient, refilled his loop diuretic therapy.   Assessment and Plan: * Acute on chronic systolic (congestive) heart failure (HCC) Echocardiogram with reduced LV systolic function with EF < 20%, global hypokinesis, severe dilatation LV cavity, RV systolic function with mild reduction, RV with severe dilatation, RVSP 49,7 mmHg, LA with moderate dilatation, LA with severe dilatation, no significant valvular disease.   Patient was placed on IV furosemide  for diuresis, negative fluid balance was achieved, -2,659 ml, with improvement in his symptoms.   Plan to continue medical therapy with spironolactone , entresto , SGLT 2 inh and digoxin .  Diuresis with torsemide  60 mg po daily.  Follow up with heart failure clinic as outpatient   Paroxysmal atrial flutter (HCC) Patient has remained on sinus rhythm,  Plan to continue digoxin  for rate control and continue apixaban  for anticoagulation   Influenza A Continue oseltamivir , for 5 days.  Patient with no clinical sing of viral pneumonia.   Other cirrhosis of liver (HCC) No signs of decompensation Continue outpatient follow up.   AKI (acute kidney injury) Hyponatremia.   At the time of  discharge his renal function has a serum cr of 0,91 with K at 4.1 and serum bicarbonate at 26  Na 132   Plan to continue diuretic therapy with torsemide , SGLT 2 inh, and spironolactone  Continue K supplementation to avoid  hypokalemia Follow up renal function and electrolytes as outpatient in 7 days.   OSA (obstructive sleep apnea) Follow up as outpatient   Type 2 diabetes mellitus with hyperglycemia, without long-term current use of insulin  (HCC) His glucose remained stable during his hospitalization Continue with SGLT 2 inh and GLP 1 agonist.   Anemia Stable cell count.  Follow up as outpatient  Obesity, Class III, BMI 40-49.9 (morbid obesity) (HCC) Calculated BMI 47,0       Consultants: none  Procedures performed: none   Disposition: Home Diet recommendation:  Cardiac diet DISCHARGE MEDICATION: Allergies as of 06/23/2024       Reactions   Raspberry Anaphylaxis   Penicillins Rash   Reaction: Childhood        Medication List     STOP taking these medications    amiodarone  200 MG tablet Commonly known as: PACERONE    bisacodyl  5 MG EC tablet Commonly known as: DULCOLAX   thiamine  100 MG tablet Commonly known as: VITAMIN B1       TAKE these medications    apixaban  5 MG Tabs tablet Commonly known as: ELIQUIS  Take 1 tablet (5 mg total) by mouth 2 (two) times daily.   digoxin  0.125 MG tablet Commonly known as: LANOXIN  Take 1 tablet (125 mcg total) by mouth daily.   Farxiga  10 MG Tabs tablet Generic drug: dapagliflozin  propanediol Take 1 tablet (10 mg total) by mouth daily before breakfast.   Iron  (Ferrous Sulfate ) 325 (65 Fe) MG Tabs Take 325 mg by mouth every other day.   metoprolol  succinate 25 MG 24 hr tablet Commonly known as: Toprol  XL Take 1 tablet (25 mg total) by mouth daily.   oseltamivir  75 MG capsule Commonly known as: TAMIFLU  Take 1 capsule (75 mg total) by mouth daily for 2 days. Start taking on: June 24, 2024   potassium chloride  SA 20 MEQ tablet Commonly known as: Klor-Con  M20 Take 3 tablets (60 mEq total) by mouth daily. What changed: how much to take   sacubitril -valsartan  24-26 MG Commonly known as: ENTRESTO  Take 1 tablet by mouth 2  (two) times daily.   spironolactone  50 MG tablet Commonly known as: ALDACTONE  Take 1 tablet (50 mg total) by mouth daily.   tirzepatide  2.5 MG/0.5ML Pen Commonly known as: MOUNJARO  Inject 2.5 mg into the skin once a week.   torsemide  20 MG tablet Commonly known as: DEMADEX  Take 3 tablets (60 mg total) by mouth daily.   Vitamin D3 50 MCG (2000 UT) capsule Take 1 capsule (2,000 Units total) by mouth daily.        Discharge Exam: Filed Weights   06/20/24 2257 06/21/24 1409  Weight: 133.8 kg 132.2 kg   BP 101/68 (BP Location: Left Arm)   Pulse 66   Temp 98.1 F (36.7 C) (Oral)   Resp 18   Ht 5' 6 (1.676 m)   Wt 132.2 kg   SpO2 98%   BMI 47.04 kg/m   Patient with no chest pain and no dyspnea, no PND, orthopnea or lower extremity edema.   Neurology awake and alert ENT with mild pallor with no icterus No JVD Cardiovascular with S1 and S2 present and regular with no gallops, rubs or murmurs Respiratory with no rales or wheezing, no  rhonchi Abdomen protuberant, soft and non tender, not distended No  lower extremity edema   Condition at discharge: stable  The results of significant diagnostics from this hospitalization (including imaging, microbiology, ancillary and laboratory) are listed below for reference.   Imaging Studies: DG Chest 1 View Result Date: 06/20/2024 CLINICAL DATA:  Shortness of breath EXAM: PORTABLE CHEST 1 VIEW COMPARISON:  05/04/2024 FINDINGS: Cardiac shadow is stable. Defibrillator is again noted and stable. Previously seen right PICC has been removed in the interval. Patchy bibasilar atelectasis is noted. IMPRESSION: Patchy bibasilar atelectasis. Electronically Signed   By: Oneil Devonshire M.D.   On: 06/20/2024 23:44    Microbiology: Results for orders placed or performed during the hospital encounter of 06/20/24  Resp panel by RT-PCR (RSV, Flu A&B, Covid) Anterior Nasal Swab     Status: Abnormal   Collection Time: 06/20/24 11:12 PM   Specimen:  Anterior Nasal Swab  Result Value Ref Range Status   SARS Coronavirus 2 by RT PCR NEGATIVE NEGATIVE Final   Influenza A by PCR POSITIVE (A) NEGATIVE Final   Influenza B by PCR NEGATIVE NEGATIVE Final    Comment: (NOTE) The Xpert Xpress SARS-CoV-2/FLU/RSV plus assay is intended as an aid in the diagnosis of influenza from Nasopharyngeal swab specimens and should not be used as a sole basis for treatment. Nasal washings and aspirates are unacceptable for Xpert Xpress SARS-CoV-2/FLU/RSV testing.  Fact Sheet for Patients: bloggercourse.com  Fact Sheet for Healthcare Providers: seriousbroker.it  This test is not yet approved or cleared by the [place]  FDA and has been authorized for detection and/or diagnosis of SARS-CoV-2 by FDA under an Emergency Use Authorization (EUA). This EUA will remain in effect (meaning this test can be used) for the duration of the COVID-19 declaration under Section 564(b)(1) of the Act, 21 U.S.C. section 360bbb-3(b)(1), unless the authorization is terminated or revoked.     Resp Syncytial Virus by PCR NEGATIVE NEGATIVE Final    Comment: (NOTE) Fact Sheet for Patients: bloggercourse.com  Fact Sheet for Healthcare Providers: seriousbroker.it  This test is not yet approved or cleared by the [place]  FDA and has been authorized for detection and/or diagnosis of SARS-CoV-2 by FDA under an Emergency Use Authorization (EUA). This EUA will remain in effect (meaning this test can be used) for the duration of the COVID-19 declaration under Section 564(b)(1) of the Act, 21 U.S.C. section 360bbb-3(b)(1), unless the authorization is terminated or revoked.  Performed at [facility], [address], [place], [place] [address]     Labs: CBC: Recent Labs  Lab 06/20/24 2312 06/23/24 0237  WBC 7.9 8.3  NEUTROABS 6.6  --   HGB 14.1 14.1  HCT  43.4 43.2  MCV 81.0 79.9*  PLT 208 245   Basic Metabolic Panel: Recent Labs  Lab 06/20/24 2312 06/21/24 0513 06/22/24 0259 06/23/24 0237  NA 132* 133* 132* 132*  K 4.1 4.3 4.3 4.1  CL 95* 97* 95* 97*  CO2 24 25 25 26   GLUCOSE 112* 144* 124* 99  BUN 20 19 31* 27*  CREATININE 1.44* 1.24 1.11 0.91  CALCIUM  9.0 8.7* 8.8* 8.8*  MG  --  2.4  --   --    Liver Function Tests: Recent Labs  Lab 06/20/24 2312  AST 44*  ALT 21  ALKPHOS 68  BILITOT 1.0  PROT 8.1  ALBUMIN 4.2   CBG: Recent Labs  Lab 06/21/24 1548 06/21/24 2126  GLUCAP 113* 142*    Discharge time spent:  greater than 30 minutes.  Signed: Elidia Toribio Furnace, MD [facility] 06/23/2024 "

## 2024-06-23 NOTE — Assessment & Plan Note (Signed)
 Calculated BMI 47,0

## 2024-06-23 NOTE — Assessment & Plan Note (Signed)
 Continue oseltamivir , for 5 days.  Patient with no clinical sing of viral pneumonia.

## 2024-06-23 NOTE — Assessment & Plan Note (Signed)
 Follow up as outpatient

## 2024-06-23 NOTE — Assessment & Plan Note (Signed)
 Patient has remained on sinus rhythm,  Plan to continue digoxin  for rate control and continue apixaban  for anticoagulation

## 2024-06-23 NOTE — Assessment & Plan Note (Signed)
 Echocardiogram with reduced LV systolic function with EF < 20%, global hypokinesis, severe dilatation LV cavity, RV systolic function with mild reduction, RV with severe dilatation, RVSP 49,7 mmHg, LA with moderate dilatation, LA with severe dilatation, no significant valvular disease.   Patient was placed on IV furosemide  for diuresis, negative fluid balance was achieved, -2,659 ml, with improvement in his symptoms.   Plan to continue medical therapy with spironolactone , entresto , SGLT 2 inh and digoxin .  Diuresis with torsemide  60 mg po daily.  Follow up with heart failure clinic as outpatient

## 2024-06-23 NOTE — Assessment & Plan Note (Signed)
 Hyponatremia.   At the time of discharge his renal function has a serum cr of 0,91 with K at 4.1 and serum bicarbonate at 26  Na 132   Plan to continue diuretic therapy with torsemide , SGLT 2 inh, and spironolactone  Continue K supplementation to avoid hypokalemia Follow up renal function and electrolytes as outpatient in 7 days.

## 2024-06-23 NOTE — Assessment & Plan Note (Signed)
 Stable cell count.  Follow up as outpatient

## 2024-06-23 NOTE — Hospital Course (Addendum)
 Mr. Karolee was admitted to the hospital with the working diagnosis of congested heart failure exacerbation in the setting of medical non compliance and influenza   48 yo male with history of alcohol and tobacco use, heart failure, cirrhosis, ICD, obesity, atrial flutter, iron  deficiency anemia, and OSA who presented with dyspnea.  Patient run out of torsemide  few days prior to admission, at home he developed dyspnea, palpitations and lightheadedness. He reported cough, and fever for the last 5 days prior to admission.  Recent hospitalization 11/3 to 05/12/24 for heart failure exacerbation, with cardiogenic shock requiring inotropic support while inpatient.  On his initial physical examination his blood pressure was 106/71, HR 85, RR 22 and 02 saturation 95% Lungs with no wheezing or rhonchi, heart with S1 and S2 present and regular, abdomen soft and non tender, trace lower extremity edema.   Na 132, K 4.1 Cl 95 bicarbonate 24 glucose 112, bun 20 cr 1,44 AST 44 ALT 21 BNP 6,195  High sensitive troponin 46 and 39  Wbc 7,9 hgb 14.1 plt 208  INR 1,4   Influenza A positive Influenza B negative RSV negative COVID 19 negative   Chest radiograph with hypoinflation, positive cardiomegaly, bilateral hilar vascular congestion, bilateral central interstitial infiltrates, more predominant at the lower lobes.   EKG 94 bpm, left axis deviation, normal intervals, qtc 462, sinus rhythm with left atrial enlargement, no significant ST segment or T wave changes, poor RR wave progression.   Patient was placed on IV furosemide  and oral oseltamivir .   12/24 improved volume status.  Plan to follow up as outpatient, refilled his loop diuretic therapy.

## 2024-06-23 NOTE — Assessment & Plan Note (Addendum)
 His glucose remained stable during his hospitalization Continue with SGLT 2 inh and GLP 1 agonist.

## 2024-06-23 NOTE — Assessment & Plan Note (Signed)
 No signs of decompensation Continue outpatient follow up.

## 2024-06-25 ENCOUNTER — Telehealth: Payer: Self-pay | Admitting: *Deleted

## 2024-06-25 NOTE — Transitions of Care (Post Inpatient/ED Visit) (Signed)
" ° °  06/25/2024  Name: Kyle Pearson MRN: [id number] DOB: [birth year]  Today's TOC FU Call Status: Today's TOC FU Call Status:: Unsuccessful Call (1st Attempt) Unsuccessful Call (1st Attempt) Date: 06/25/24  Attempted to reach the patient regarding the most recent Inpatient/ED visit.  Follow Up Plan: Additional outreach attempts will be made to reach the patient to complete the Transitions of Care (Post Inpatient/ED visit) call.   Andrea Dimes RN, BSN [place]  [facility] RN Care Manager [phone]  "

## 2024-06-28 ENCOUNTER — Ambulatory Visit: Admitting: Family Medicine

## 2024-06-28 ENCOUNTER — Other Ambulatory Visit: Payer: Self-pay | Admitting: Family Medicine

## 2024-06-28 ENCOUNTER — Telehealth: Payer: Self-pay | Admitting: *Deleted

## 2024-06-28 NOTE — Transitions of Care (Post Inpatient/ED Visit) (Signed)
" ° °  06/28/2024  Name: Kyle Pearson MRN: [id number] DOB: 1975/08/17  Today's TOC FU Call Status: Today's TOC FU Call Status:: Unsuccessful Call (2nd Attempt) Unsuccessful Call (2nd Attempt) Date: 06/28/24  Attempted to reach the patient regarding the most recent Inpatient/ED visit.  Follow Up Plan: Additional outreach attempts will be made to reach the patient to complete the Transitions of Care (Post Inpatient/ED visit) call.   Andrea Dimes RN, BSN [place]  [facility] RN Care Manager [phone]  "

## 2024-06-29 ENCOUNTER — Telehealth: Payer: Self-pay | Admitting: *Deleted

## 2024-06-29 NOTE — Transitions of Care (Post Inpatient/ED Visit) (Signed)
" ° °  06/29/2024  Name: Kyle Pearson MRN: [id number] DOB: 09/20/1975  Today's TOC FU Call Status: Today's TOC FU Call Status:: Unsuccessful Call (3rd Attempt) Unsuccessful Call (3rd Attempt) Date: 06/29/24  Attempted to reach the patient regarding the most recent Inpatient/ED visit.  Follow Up Plan: No further outreach attempts will be made at this time. We have been unable to contact the patient.  Mliss Creed [facility], BSN RN Care Manager/ Transition of Care [facility] [phone]  "

## 2024-07-07 ENCOUNTER — Encounter: Payer: Self-pay | Admitting: Family Medicine

## 2024-07-07 ENCOUNTER — Ambulatory Visit: Admitting: Family Medicine

## 2024-07-07 VITALS — BP 118/75 | HR 75 | Ht 66.0 in | Wt 294.6 lb

## 2024-07-07 DIAGNOSIS — I4892 Unspecified atrial flutter: Secondary | ICD-10-CM

## 2024-07-07 DIAGNOSIS — Z7984 Long term (current) use of oral hypoglycemic drugs: Secondary | ICD-10-CM | POA: Diagnosis not present

## 2024-07-07 DIAGNOSIS — Z6841 Body Mass Index (BMI) 40.0 and over, adult: Secondary | ICD-10-CM | POA: Diagnosis not present

## 2024-07-07 DIAGNOSIS — E1165 Type 2 diabetes mellitus with hyperglycemia: Secondary | ICD-10-CM | POA: Diagnosis not present

## 2024-07-07 DIAGNOSIS — N179 Acute kidney failure, unspecified: Secondary | ICD-10-CM | POA: Diagnosis not present

## 2024-07-07 DIAGNOSIS — I5023 Acute on chronic systolic (congestive) heart failure: Secondary | ICD-10-CM | POA: Diagnosis not present

## 2024-07-07 DIAGNOSIS — R7989 Other specified abnormal findings of blood chemistry: Secondary | ICD-10-CM

## 2024-07-07 DIAGNOSIS — I5022 Chronic systolic (congestive) heart failure: Secondary | ICD-10-CM

## 2024-07-07 NOTE — Progress Notes (Signed)
 "  Acute Office Visit  Patient ID: Kyle Pearson, male    DOB: January 01, 1976, 49 y.o.   MRN: [id number]  PCP: Kayla Kyle RAMAN, Pearson  Chief Complaint  Patient presents with   Hospitalization Follow-up    12/21 CHF  Feeling pressure in chest      Subjective:     HPI Kyle Pearson is here today for hospital follow up. He was hospitalized from 06/20/2024-06/23/2024 for CHF exacerbation. See below for reference.  Discussed the use of AI scribe software for clinical note transcription with the patient, who gave verbal consent to proceed.  History of Present Illness Kyle Pearson is a 49 year old male with heart failure who presents for hospital follow up.  He was hospitalized from December 21 to December 27 for heart failure and pneumonia, during which he received IV diuretics due to fluid overload. This was attributed to running out of his prescribed diuretic medication, as he was taking a reduced dose because his prescription was not approved in time. He now has a 90-day supply of his medications.  Since discharge, his breathing has normalized, and he no longer experiences shortness of breath with walking. He engages in [place] [employer] walking and stair climbing, which he finds easier now. He monitors his weight daily, noting a gradual decrease, and is cautious with his sodium intake, avoiding added salt and eating out.  He experienced an episode of anxiety and heart racing after consuming a meal of shrimp with tomato, garlic, onions, butter, heavy cream, and mozzarella cheese. He denies any known allergies to shrimp and felt better the following day. He also consumed a sugary lemonade with the meal.  He reports a sensation of chest pressure that began after his hospital discharge on December 27. This pressure feels like 'air' inside his chest, which is relieved by burping. He experiences this sensation daily and associates it with his lung exercises. He notes a slight pressure upon arrival at  the clinic.  He is currently taking Eliquis  5 mg twice daily, digoxin  0.125 mg daily, Farxiga , metoprolol , potassium (three tablets daily), Entresto , spironolactone , and torsemide . He has discontinued tirzepatide  due to feelings of anxiety and plans to manage his weight independently. He occasionally checks his blood sugar levels at home.  Socially, he has abstained from alcohol for over six months and has not smoked for three to four years. He recently started a job at [employer], working five-hour shifts, and reports feeling tired after standing for extended periods. He is considering returning to full-time work after nearly a year of unemployment.  Discharge Summary 06/23/2024 for reference only: Recommendations at discharge:     Patient will complete 5 days of Oseltamivir  Refilled torsemide  and SGLT 2 inh, along with K supplementation.  Continue heart failure guideline directed medical therapy with entresto , spironolactone , digoxin  and SGLT 2 inh Follow up renal function and electrolytes in 7 days as outpatient  Follow up with Kyle Pearson in 7 to 10 days as outpatient Follow up with Cardiology as scheduled.    Discharge Diagnoses: Principal Problem:   Acute exacerbation of CHF (congestive heart failure) (HCC)   Resolved Problems:   * No resolved hospital problems. [facility] Course: Kyle. Karolee was admitted to the hospital with the working diagnosis of congested heart failure exacerbation in the setting of medical non compliance and influenza    49 yo male with history of alcohol and tobacco use, heart failure, cirrhosis, ICD, obesity, atrial flutter, iron  deficiency  anemia, and OSA who presented with dyspnea.  Patient run out of torsemide  few days prior to admission, at home he developed dyspnea, palpitations and lightheadedness. He reported cough, and fever for the last 5 days prior to admission.  Recent hospitalization 11/3 to 05/12/24 for heart failure exacerbation, with  cardiogenic shock requiring inotropic support while inpatient.  On his initial physical examination his blood pressure was 106/71, HR 85, RR 22 and 02 saturation 95% Lungs with no wheezing or rhonchi, heart with S1 and S2 present and regular, abdomen soft and non tender, trace lower extremity edema.    Na 132, K 4.1 Cl 95 bicarbonate 24 glucose 112, bun 20 cr 1,44 AST 44 ALT 21 BNP 6,195  High sensitive troponin 46 and 39  Wbc 7,9 hgb 14.1 plt 208  INR 1,4    Influenza A positive Influenza B negative RSV negative COVID 19 negative    Chest radiograph with hypoinflation, positive cardiomegaly, bilateral hilar vascular congestion, bilateral central interstitial infiltrates, more predominant at the lower lobes.    EKG 94 bpm, left axis deviation, normal intervals, qtc 462, sinus rhythm with left atrial enlargement, no significant ST segment or T wave changes, poor RR wave progression.    Patient was placed on IV furosemide  and oral oseltamivir .    12/24 improved volume status.  Plan to follow up as outpatient, refilled his loop diuretic therapy.    Assessment and Plan: * Acute on chronic systolic (congestive) heart failure (HCC) Echocardiogram with reduced LV systolic function with EF < 20%, global hypokinesis, severe dilatation LV cavity, RV systolic function with mild reduction, RV with severe dilatation, RVSP 49,7 mmHg, LA with moderate dilatation, LA with severe dilatation, no significant valvular disease.    Patient was placed on IV furosemide  for diuresis, negative fluid balance was achieved, -2,659 ml, with improvement in his symptoms.    Plan to continue medical therapy with spironolactone , entresto , SGLT 2 inh and digoxin .  Diuresis with torsemide  60 mg po daily.  Follow up with heart failure clinic as outpatient    Paroxysmal atrial flutter (HCC) Patient has remained on sinus rhythm,  Plan to continue digoxin  for rate control and continue apixaban  for anticoagulation     Influenza A Continue oseltamivir , for 5 days.  Patient with no clinical sing of viral pneumonia.    Other cirrhosis of liver (HCC) No signs of decompensation Continue outpatient follow up.    AKI (acute kidney injury) Hyponatremia.    At the time of discharge his renal function has a serum cr of 0,91 with K at 4.1 and serum bicarbonate at 26  Na 132    Plan to continue diuretic therapy with torsemide , SGLT 2 inh, and spironolactone  Continue K supplementation to avoid hypokalemia Follow up renal function and electrolytes as outpatient in 7 days.    OSA (obstructive sleep apnea) Follow up as outpatient    Type 2 diabetes mellitus with hyperglycemia, without long-term current use of insulin  (HCC) His glucose remained stable during his hospitalization Continue with SGLT 2 inh and GLP 1 agonist.    Anemia Stable cell count.  Follow up as outpatient   Obesity, Class III, BMI 40-49.9 (morbid obesity) (HCC) Calculated BMI 47,0 Review of Systems  All other systems reviewed and are negative.   Past Medical History:  Diagnosis Date   Atrial flutter (HCC)    CHF (congestive heart failure) (HCC)    OSA (obstructive sleep apnea)    Pacemaker    Pulmonary hypertensive venous disease (  HCC)    Type 2 diabetes mellitus with hyperglycemia, without long-term current use of insulin  [facility])     Past Surgical History:  Procedure Laterality Date   CARDIOVERSION N/A 05/10/2024   Procedure: CARDIOVERSION;  Surgeon: Rolan Ezra RAMAN, MD;  Location: [facility] INVASIVE CV LAB;  Service: Cardiovascular;  Laterality: N/A;   RIGHT HEART CATH N/A 12/08/2020   Procedure: RIGHT HEART CATH;  Surgeon: Rolan Ezra RAMAN, MD;  Location: [facility] INVASIVE CV LAB;  Service: Cardiovascular;  Laterality: N/A;   RIGHT/LEFT HEART CATH AND CORONARY ANGIOGRAPHY N/A 06/01/2020   Procedure: RIGHT/LEFT HEART CATH AND CORONARY ANGIOGRAPHY;  Surgeon: Rolan Ezra RAMAN, MD;  Location: [facility] INVASIVE CV LAB;  Service: Cardiovascular;   Laterality: N/A;   SUBQ ICD IMPLANT N/A 01/26/2021   Procedure: SUBQ ICD IMPLANT;  Surgeon: Inocencio Soyla Lunger, MD;  Location: [facility] INVASIVE CV LAB;  Service: Cardiovascular;  Laterality: N/A;   TRANSESOPHAGEAL ECHOCARDIOGRAM (CATH LAB) N/A 05/10/2024   Procedure: TRANSESOPHAGEAL ECHOCARDIOGRAM;  Surgeon: Rolan Ezra RAMAN, MD;  Location: [facility] INVASIVE CV LAB;  Service: Cardiovascular;  Laterality: N/A;    Outpatient Medications Prior to Visit  Medication Sig Dispense Refill   apixaban  (ELIQUIS ) 5 MG TABS tablet Take 1 tablet (5 mg total) by mouth 2 (two) times daily. 60 tablet 11   Cholecalciferol (VITAMIN D3) 50 MCG (2000 UT) capsule Take 1 capsule (2,000 Units total) by mouth daily. 90 capsule 0   digoxin  (LANOXIN ) 0.125 MG tablet Take 1 tablet (125 mcg total) by mouth daily. 30 tablet 6   FARXIGA  10 MG TABS tablet Take 1 tablet (10 mg total) by mouth daily before breakfast. 90 tablet 0   Iron , Ferrous Sulfate , 325 (65 Fe) MG TABS Take 325 mg by mouth every other day. 30 tablet 1   metoprolol  succinate (TOPROL  XL) 25 MG 24 hr tablet Take 1 tablet (25 mg total) by mouth daily. 30 tablet 6   potassium chloride  SA (KLOR-CON  M20) 20 MEQ tablet Take 3 tablets (60 mEq total) by mouth daily. 270 tablet 0   sacubitril -valsartan  (ENTRESTO ) 24-26 MG Take 1 tablet by mouth 2 (two) times daily. 60 tablet 6   spironolactone  (ALDACTONE ) 50 MG tablet Take 1 tablet (50 mg total) by mouth daily. 30 tablet 6   tirzepatide  (MOUNJARO ) 2.5 MG/0.5ML Pen Inject 2.5 mg into the skin once a week. 2 mL 1   torsemide  (DEMADEX ) 20 MG tablet Take 3 tablets (60 mg total) by mouth daily. 270 tablet 0   No facility-administered medications prior to visit.    Allergies[1]     Objective:    BP 118/75   Pulse 75   Ht 5' 6 (1.676 m)   Wt 294 lb 9.6 oz (133.6 kg)   SpO2 96%   BMI 47.55 kg/m  BP Readings from Last 3 Encounters:  07/07/24 118/75  06/23/24 101/68  06/14/24 110/72   Wt Readings from Last 3  Encounters:  07/07/24 294 lb 9.6 oz (133.6 kg)  06/21/24 291 lb 7.2 oz (132.2 kg)  06/14/24 (!) 302 lb 12.8 oz (137.3 kg)      Physical Exam Vitals and nursing note reviewed.  Constitutional:      Appearance: Normal appearance. He is obese.  HENT:     Head: Normocephalic and atraumatic.  Cardiovascular:     Rate and Rhythm: Normal rate and regular rhythm.     Pulses: Normal pulses.     Heart sounds: Normal heart sounds.  Pulmonary:     Effort: Pulmonary effort  is normal.     Breath sounds: Normal breath sounds.  Skin:    General: Skin is warm and dry.     Capillary Refill: Capillary refill takes less than 2 seconds.  Neurological:     General: No focal deficit present.     Mental Status: He is alert and oriented to person, place, and time. Mental status is at baseline.  Psychiatric:        Mood and Affect: Mood normal.        Behavior: Behavior normal.        Thought Content: Thought content normal.        Judgment: Judgment normal.       No results found for any visits on 07/07/24.     Assessment & Plan:   Problem List Items Addressed This Visit       Cardiovascular and Mediastinum   CHF (congestive heart failure) (HCC) - Primary   Relevant Orders   Comprehensive metabolic panel with GFR   EKG 87-Ozji   Atrial flutter (HCC)   Relevant Orders   EKG 12-Lead     Endocrine   Type 2 diabetes mellitus with hyperglycemia, without long-term current use of insulin  (HCC)   Relevant Orders   Hemoglobin A1c     Genitourinary   AKI (acute kidney injury)   Relevant Orders   Comprehensive metabolic panel with GFR     Other   Morbid obesity (HCC)   Low vitamin D  level   Relevant Orders   VITAMIN D  25 Hydroxy (Vit-D Deficiency, Fractures)    Assessment and Plan Assessment & Plan Chronic systolic congestive heart failure Recent exacerbation due to medication non-adherence, now stable with improved oxygen saturation. Daily chest pressure noted, likely related to  recent hospitalization. - Continue Apixaban , Digoxin , Farxiga , Metoprolol , Potassium, Entresto , Spironolactone , Torsemide . - Order EKG for chest pressure evaluation. This was unchanged from prior. - Encouraged medication adherence and low-sodium diet. - Advised against caffeine intake.  Type 2 diabetes mellitus with hyperglycemia Inconsistent blood sugar monitoring. A1c not critically high. Tirzepatide  discontinued due to anxiety and weight loss efforts. - Check A1c for glycemic control assessment. - Encouraged regular blood sugar monitoring, especially fasting levels.  Atrial flutter Stable on Apixaban  with no new symptoms. - Continue Apixaban  5 mg oral bid and Digoxin   Morbid obesity Active weight loss through lifestyle changes, reporting daily weight loss. - Encouraged continued lifestyle modifications for weight loss.  Vitamin D  deficiency Continues Vitamin D3 supplementation. - Continue Vitamin D3 supplementation.  General Health Maintenance No alcohol or smoking. Regular physical activity and dietary modifications.    No orders of the defined types were placed in this encounter.   Return in about 3 months (around 10/05/2024) for chronic follow-up with labs 1 week prior.  Kyle GORMAN Barrio, Pearson [facility]      [1]  Allergies Allergen Reactions   Raspberry Anaphylaxis   Penicillins Rash    Reaction: Childhood   "

## 2024-07-08 LAB — COMPREHENSIVE METABOLIC PANEL WITH GFR
AG Ratio: 1.4 (calc) (ref 1.0–2.5)
ALT: 12 U/L (ref 9–46)
AST: 16 U/L (ref 10–40)
Albumin: 4.3 g/dL (ref 3.6–5.1)
Alkaline phosphatase (APISO): 75 U/L (ref 36–130)
BUN: 16 mg/dL (ref 7–25)
CO2: 29 mmol/L (ref 20–32)
Calcium: 9.1 mg/dL (ref 8.6–10.3)
Chloride: 97 mmol/L — ABNORMAL LOW (ref 98–110)
Creat: 0.81 mg/dL (ref 0.60–1.29)
Globulin: 3 g/dL (ref 1.9–3.7)
Glucose, Bld: 109 mg/dL — ABNORMAL HIGH (ref 65–99)
Potassium: 4 mmol/L (ref 3.5–5.3)
Sodium: 135 mmol/L (ref 135–146)
Total Bilirubin: 1.6 mg/dL — ABNORMAL HIGH (ref 0.2–1.2)
Total Protein: 7.3 g/dL (ref 6.1–8.1)
eGFR: 109 mL/min/1.73m2

## 2024-07-08 LAB — HEMOGLOBIN A1C
Hgb A1c MFr Bld: 6 % — ABNORMAL HIGH
Mean Plasma Glucose: 126 mg/dL
eAG (mmol/L): 7 mmol/L

## 2024-07-08 LAB — VITAMIN D 25 HYDROXY (VIT D DEFICIENCY, FRACTURES): Vit D, 25-Hydroxy: 35 ng/mL (ref 30–100)

## 2024-07-14 ENCOUNTER — Telehealth (HOSPITAL_COMMUNITY): Payer: Self-pay

## 2024-07-14 NOTE — Telephone Encounter (Signed)
 Called to confirm/remind patient of their appointment at [facility] on 07/15/24.   Appointment:   [x] Confirmed  [] Left mess   [] No answer/No voice mail  [] VM Full/unable to leave message  [] Phone not in service  Patient reminded to bring all medications and/or complete list.  Confirmed patient has transportation. Gave directions, instructed to utilize valet parking.

## 2024-07-15 ENCOUNTER — Ambulatory Visit (HOSPITAL_COMMUNITY): Payer: Self-pay | Admitting: Physician Assistant

## 2024-07-15 ENCOUNTER — Ambulatory Visit (HOSPITAL_COMMUNITY)
Admit: 2024-07-15 | Discharge: 2024-07-15 | Disposition: A | Discharge status: Home / Self Care | Source: Ambulatory Visit | Attending: Physician Assistant | Admitting: Physician Assistant

## 2024-07-15 ENCOUNTER — Encounter (HOSPITAL_COMMUNITY): Payer: Self-pay

## 2024-07-15 VITALS — BP 100/60 | HR 70 | Wt 294.8 lb

## 2024-07-15 DIAGNOSIS — I428 Other cardiomyopathies: Secondary | ICD-10-CM | POA: Insufficient documentation

## 2024-07-15 DIAGNOSIS — Z95810 Presence of automatic (implantable) cardiac defibrillator: Secondary | ICD-10-CM | POA: Insufficient documentation

## 2024-07-15 DIAGNOSIS — Z6841 Body Mass Index (BMI) 40.0 and over, adult: Secondary | ICD-10-CM | POA: Insufficient documentation

## 2024-07-15 DIAGNOSIS — Z79899 Other long term (current) drug therapy: Secondary | ICD-10-CM | POA: Diagnosis not present

## 2024-07-15 DIAGNOSIS — D649 Anemia, unspecified: Secondary | ICD-10-CM | POA: Diagnosis not present

## 2024-07-15 DIAGNOSIS — F1090 Alcohol use, unspecified, uncomplicated: Secondary | ICD-10-CM | POA: Diagnosis not present

## 2024-07-15 DIAGNOSIS — Z87891 Personal history of nicotine dependence: Secondary | ICD-10-CM | POA: Insufficient documentation

## 2024-07-15 DIAGNOSIS — I509 Heart failure, unspecified: Secondary | ICD-10-CM | POA: Diagnosis present

## 2024-07-15 DIAGNOSIS — G4733 Obstructive sleep apnea (adult) (pediatric): Secondary | ICD-10-CM | POA: Diagnosis not present

## 2024-07-15 DIAGNOSIS — Z7901 Long term (current) use of anticoagulants: Secondary | ICD-10-CM | POA: Diagnosis not present

## 2024-07-15 DIAGNOSIS — I4892 Unspecified atrial flutter: Secondary | ICD-10-CM | POA: Diagnosis not present

## 2024-07-15 DIAGNOSIS — I5022 Chronic systolic (congestive) heart failure: Secondary | ICD-10-CM | POA: Insufficient documentation

## 2024-07-15 LAB — DIGOXIN LEVEL: Digoxin Level: 0.9 ng/mL (ref 0.8–2.0)

## 2024-07-15 NOTE — Progress Notes (Signed)
 "     Advanced Heart Failure Clinic Note  PCP: [facility] HF Cardiologist: Dr. Rolan  HPI: Kyle Pearson is a 49 y.o. with history of tobacco and alcohol use, diagnosed with CHF in 10/21.   He states in 03/16/2020 he started to develop a cough and didn't want to mix medication with alcohol so he stopped drinking and started to take cough medication.  He noticed one day a very severe pain on his left side and pain with swallowing.  He was diagnosed with peritonsillar abscess and placed on clindamycin  (300 mg po QID x 10 days) and decadron .  He reports feeling better throat wise but noticed increase swelling to his abdomen and difficulty breathing. He went to [facility] on 04/24/2020 for shortness of breath, abdominal distention and left sided chest pain. 2D echo showed EF 15-20% with severe LVH, BNP 562 and troponin 54.  He was started on lisinopril  5 mg, toprol -XL 25 mg PO daily and lasix  IV.  He also had an abdominal ultrasound which showed no significant asites but note mild liver cirrhosis with mild gallbladder wall thickening. He was discharged home on 40 mg PO lasix , KCL 20 mEq, lisinopril  5 and toprol -XL 25.     He presented to 1st PCP appt on 05/29/20 and was found to have shortness of breath, bilaterally lower extremity edema and abdominal distention.  Reporting 30lb weight gain despite compliance with medication. He was sent to [facility] , where his BNP was [facility] BNP 562), CXR showed cardiomegaly only. Repeat echo revealed EF < 20%, severe decrease function with global hypokinesis, grade III DD, dilated LA/RA and small pericardial effusion. He was admitted 05/29/20-06/06/20 with marked volume overload. Cath showed elevated filling pressures and low output, no significant CAD. Milrinone  was added and he continued to diurese on IV. Milrinone  later weaned off with stable co-ox. Started on GDMT. Had cMRI with severely reduced EF and non-coronary pattern extensive scar. Due  to concern for ectopy from scar, he was discharged with Lifevest.  Diuresed 35 lbs. Discharge weight 255.6 lbs.  Echo 3/22 showed that EF remains < 20%, moderate LVE, moderate RVE, mildly decreased RV systolic function, IVC dilated.  CPX in 4/22 showed only a mild HF limitation.   Admitted 11/2020 with volume overload.   [employer] ICD was placed.   He had an episode of VT vs 1:1 atrial flutter in 11/23, had been out of meds x 3 days at the time.    Echo 1/25 showed EF 20-25%, G3DD, RV mildly reduced.  Seen in ED 03/08/24 with CP. Out of several meds x 3 weeks. ECG and HsTroponin reassuring.  Admitted 11/25 with a/c HF and AF. Diuresed with DBA support. Started on amiodarone  gtt and underwent DCCV to NSR. Drips weaned, GDMT titrated and he was discharged home, weight 309 lbs.  He was readmitted 12/25 with acute on chronic CHF in setting of + flu A/B and running out of diuretics several days prior.    Here today for CHF follow-up.  Denies shortness of breath, orthopnea, PND and lower extremity edema. Home weight has been stable within 1-2 lbs. He's intermittently had chest pressure for several months. Symptoms improve with rubbing his upper chest, worse if he doesn't eat enough. Recently stopped Mounjaro . It suppressed his appetite but notes weight loss had been stagnant.  States he never got CPAP supplies.  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 Reports he quit tobacco 5-6 years ago, has cut  back ETOH significantly.   ECG (personally reviewed): none ordered today.  Labs (6/22): Trypanosoma cruzi antibody test negative Labs (3/24): K 3.8, creatinine 0.78 Labs (11/24): K 4.0, creatinine 0.89 Labs (1/25): K 4.0, creatinine 0.69 Labs (9/25): K 4.1, creatinine 0.85 Labs (11/25): K 4.3, creatinine 1.1, normal LFTs Labs (1/26): K 4.0, scr 0.81, T bili 1.6, AST/ALT/alkphos WNL,   PMH: 1. Prior smoker. 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  [employer] ICD.  HIV negative.  No strong family history. Prior heavy ETOH but quit in 9/21.   - Echo (11/21): EF < 20%, severe LV dilation, severe RVE and severely decreased RV systolic function, severe biatrial enlargement.  - Cardiac MRI (11/21): Severely dilated LV with diffuse hypokinesis, EF 17%., mod dilated RV with EF 20%, diffuse mid-wall LGE in septum, inferior wall & inferolateral wall - LHC/RHC (12/21): No significant CAD; mean RA 29, PA 69/32 mean 49, mean PCWP 43, CI 2.1 (Fick) and 1.7 (thermo), PVR 1.22 - Echo (3/22): EF < 20%, moderate LVE, moderate RVE, mildly decreased RV systolic function, IVC dilated.  - CPX (4/22): Peak VO2 20.2, VE/VCO2 slope 33, RER 1.07.  Mild HF limitation.  - Echo (1/25): EF 20-25%, G3DD, RV mildly reduced - Echo (11/25): EF <20%, RV mildly reduced 4. OSA 5. VT: 3/24, 11/24.  6. AFL: TEE-guided DCCV to NSR (11/25), TEE showed LVEF < 20%, RV moderately reduced, no thrombus, no PFO or ASD, moderate to severe MR, suspect functional    ROS: All systems negative except as listed in HPI, PMH and Problem List.  SH:  Social History   Socioeconomic History   Marital status: Married    Spouse name: Not on file   Number of children: Not on file   Years of education: Not on file   Highest education level: Not on file  Occupational History   Not on file  Tobacco Use   Smoking status: Former    Current packs/day: 0.00    Types: Cigarettes    Quit date: 2020    Years since quitting: 6.0   Smokeless tobacco: Never  Vaping Use   Vaping status: Never Used  Substance and Sexual Activity   Alcohol use: Not Currently   Drug use: Not Currently   Sexual activity: Not Currently  Other Topics Concern   Not on file  Social History Narrative   Lives with spouse   Social Drivers of Health   Tobacco Use: Medium Risk (07/15/2024)   Patient History    Smoking Tobacco Use: Former    Smokeless Tobacco Use: Never    Passive Exposure: Not on Surveyor, Minerals Strain: Not on file  Food Insecurity: No Food Insecurity (06/21/2024)   Epic    Worried About Radiation Protection Practitioner of Food in the Last Year: Never true    Ran Out of Food in the Last Year: Never true  Transportation Needs: No Transportation Needs (06/21/2024)   Epic    Lack of Transportation (Medical): No    Lack of Transportation (Non-Medical): No  Physical Activity: Not on file  Stress: Not on file  Social Connections: Not on file  Intimate Partner Violence: Not At Risk (06/21/2024)   Epic    Fear of Current or Ex-Partner: No    Emotionally Abused: No    Physically Abused: No    Sexually Abused: No  Depression (PHQ2-9): Low Risk (07/07/2024)   Depression (PHQ2-9)    PHQ-2 Score: 1  Recent Concern: Depression (PHQ2-9) -  Medium Risk (05/19/2024)   Depression (PHQ2-9)    PHQ-2 Score: 9  Alcohol Screen: Not on file  Housing: Low Risk (06/21/2024)   Epic    Unable to Pay for Housing in the Last Year: No    Number of Times Moved in the Last Year: 0    Homeless in the Last Year: No  Utilities: Not At Risk (06/21/2024)   Epic    Threatened with loss of utilities: No  Health Literacy: Not on file   FH:  Family History  Problem Relation Age of Onset   Diabetes Mother    Stroke Father    Diabetes Sister    Current Outpatient Medications  Medication Sig Dispense Refill   apixaban  (ELIQUIS ) 5 MG TABS tablet Take 1 tablet (5 mg total) by mouth 2 (two) times daily. 60 tablet 11   Cholecalciferol (VITAMIN D3) 50 MCG (2000 UT) capsule Take 1 capsule (2,000 Units total) by mouth daily. 90 capsule 0   digoxin  (LANOXIN ) 0.125 MG tablet Take 1 tablet (125 mcg total) by mouth daily. 30 tablet 6   FARXIGA  10 MG TABS tablet Take 1 tablet (10 mg total) by mouth daily before breakfast. 90 tablet 0   Iron , Ferrous Sulfate , 325 (65 Fe) MG TABS Take 325 mg by mouth every other day. 30 tablet 1   metoprolol  succinate (TOPROL  XL) 25 MG 24 hr tablet Take 1 tablet (25 mg total) by mouth daily. 30  tablet 6   potassium chloride  SA (KLOR-CON  M20) 20 MEQ tablet Take 3 tablets (60 mEq total) by mouth daily. 270 tablet 0   sacubitril -valsartan  (ENTRESTO ) 24-26 MG Take 1 tablet by mouth 2 (two) times daily. 60 tablet 6   spironolactone  (ALDACTONE ) 50 MG tablet Take 1 tablet (50 mg total) by mouth daily. 30 tablet 6   torsemide  (DEMADEX ) 20 MG tablet Take 3 tablets (60 mg total) by mouth daily. 270 tablet 0   tirzepatide  (MOUNJARO ) 2.5 MG/0.5ML Pen Inject 2.5 mg into the skin once a week. (Patient not taking: Reported on 07/15/2024) 2 mL 1   No current facility-administered medications for this encounter.   Wt Readings from Last 3 Encounters:  07/15/24 133.7 kg (294 lb 12.8 oz)  07/07/24 133.6 kg (294 lb 9.6 oz)  06/21/24 132.2 kg (291 lb 7.2 oz)   BP 100/60   Pulse 70   Wt 133.7 kg (294 lb 12.8 oz)   SpO2 94%   BMI 47.58 kg/m   PHYSICAL EXAM: General:  Well appearing. Ambulated into clinic. Cor: No JVD. Regular rate & rhythm. No murmurs. Lungs: clear Abdomen: obese, soft, nontender, nondistended Extremities: no edema Neuro: alert & orientedx3. Affect pleasant   ASSESSMENT & PLAN: 1. Chronic systolic CHF: Nonischemic cardiomyopathy. [employer] ICD. Symptomatic since 9/21 (after episode of peri-tonsillar abscess).  He drinks ETOH but does not seem to describe heavy enough ETOH to cause profound cardiomyopathy (though he did have mild cirrhosis on abdominal US  at [facility]). No FH of cardiomyopathy. Echo 11/21 with EF < 20% with severe LV dilation, moderate RVE with severely decreased systolic function, severe biatrial enlargement, mild MR. [facility] 12/21 showed no coronary disease, markedly elevated filling pressures, and low cardiac output. Cardiac MRI in 11/21 showed severely dilated LV with diffuse hypokinesis, EF 17%, mod dilated RV with EF 20%, diffuse mid-wall LGE in septum, inferior wall & inferolateral wall.  LGE pattern suggests possible prior myocarditis. Echo in 3/22 showed that  EF remains < 20% with mild RV dysfunction. CPX in  4/22 showed only a mild HF limitation.  Trypanosoma cruzi antibodies were negative (had h/o neurocysticercosis), probably not cardiomyopathy related to trypanosomiasis. Echo 1/25 showed EF 20-25%, G3DD, RV mildly reduced. Echo 11/25, with EF <20%, mildly reduced RV. NYHA II. Volume looks good. Continue Torsemide  60 mg daily + KCL 60 mEq daily. - Continue Toprol  XL 25 mg daily. - Continue Farxiga  10 mg daily. - Continue spironolactone  50 mg daily.   - Continue digoxin  0.125 mg daily. Check dig level today. - Continue Entresto  24/26 mg bid. - Scr stable 0.81, K 4.0 on labs 1/7 - He has been referred to CR. They have had trouble reaching him. - Has echo scheduled next month 2. Atrial flutter: new as of ICM remote monitoring 04/17/24; seen on ECG by PCP. Underwent TEE guided DCCV 05/10/24 to NSR. Regular on exam today. SR on ECG 1/7. - Continue amiodarone  200 mg daily. Check TSH okay 12/25, LFTs okay 1/26. He will need a regular eye exam while on amiodarone . - Continue Eliquis  5 mg bid. No bleeding issues. No bleeding issues - Referred to EP for ablation discussions. Has upcoming appointment 3. Anemia: he received IV iron  during recent admission.  - Has seen GI. Eventual endoscopy recommended. Missed last appointment d/t admission, recommended he call to arrange follow-up. 4. ETOH:  He did have early cirrhosis on abdominal US  at [facility].  However, with obesity could be NAFLD. He has cut back ETOH intake - Discussed cessation. 5. OSA: Moderate by sleep study 08/2020. AHI 31.1/hr - He states he never received CPAP - Refer to Pulmonary 6. Morbid Obesity: Body mass index is 47.58 kg/m.  - He self-discontinued tirzepatide . Trying to work on weight loss on his own. Can consider retrial in the future.  Follow up As scheduled in 02/26 with Dr. Rolan with echo Manuelita Dutch, PA-C 07/15/24  "

## 2024-07-15 NOTE — Patient Instructions (Signed)
 Medication Changes:  None, continue current medications  Lab Work:  Labs done today, your results will be available in MyChart, we will contact you for abnormal readings.   Referrals:  You have been referred to EP for possible afib ablation, this is sch for 08/10/24 at 8:45 am with Dr Inocencio Lanius have been referred to [facility] for sleep apnea, they will call you to schedule an appointment  Please follow-up with [facility], you can call them at [phone]  Special Instructions // Education:  Do the following things EVERYDAY: Weigh yourself in the morning before breakfast. Write it down and keep it in a log. Take your medicines as prescribed Eat low salt foods--Limit salt (sodium) to 2000 mg per day.  Stay as active as you can everyday Limit all fluids for the day to less than 2 liters   Follow-Up in: AS SCHEDULED 08/10/23 with echocardiogram and follow-up with Dr Rolan   At the Advanced Heart Failure Clinic, you and your health needs are our priority. We have a designated team specialized in the treatment of Heart Failure. This Care Team includes your primary Heart Failure Specialized Cardiologist (physician), Advanced Practice Providers (APPs- Physician Assistants and Nurse Practitioners), and Pharmacist who all work together to provide you with the care you need, when you need it.   You may see any of the following providers on your designated Care Team at your next follow up:  Dr. Toribio Fuel Dr. Ezra Rolan Dr. Odis Brownie Greig Mosses, NP Caffie Shed, [place] [facility] [place], [place] Beckey Coe, NP Jordan Lee, NP Tinnie Redman, PharmD   Please be sure to bring in all your medications bottles to every appointment.   Need to Contact Us :  If you have any questions or concerns before your next appointment please send us  a message through [place] or call our office at [phone].    TO LEAVE A MESSAGE FOR THE NURSE SELECT OPTION 2, PLEASE  LEAVE A MESSAGE INCLUDING: YOUR NAME DATE OF BIRTH CALL BACK NUMBER REASON FOR CALL**this is important as we prioritize the call backs  YOU WILL RECEIVE A CALL BACK THE SAME DAY AS LONG AS YOU CALL BEFORE 4:00 PM

## 2024-07-19 ENCOUNTER — Ambulatory Visit: Payer: Self-pay

## 2024-07-19 DIAGNOSIS — I5022 Chronic systolic (congestive) heart failure: Secondary | ICD-10-CM | POA: Diagnosis not present

## 2024-07-21 ENCOUNTER — Telehealth (HOSPITAL_COMMUNITY): Payer: Self-pay

## 2024-07-21 LAB — CUP PACEART REMOTE DEVICE CHECK
Battery Remaining Percentage: 59 %
Battery Voltage: 59
Date Time Interrogation Session: 20260120110500
HighPow Impedance: 85 Ohm
Implantable Lead Connection Status: 753985
Implantable Lead Implant Date: 20220729
Implantable Lead Location: 753862
Implantable Lead Model: 3501
Implantable Lead Serial Number: 213685
Implantable Pulse Generator Implant Date: 20220729
Pulse Gen Serial Number: 163995

## 2024-07-21 NOTE — Telephone Encounter (Signed)
 Attempted to call Dr. Orvilla office regarding requesting prior auth for cardiac rehab- no answer, left message on nurse triage line including number for [employer] prior auth ([phone]).

## 2024-07-21 NOTE — Telephone Encounter (Signed)
 Pt insurance is active and benefits verified through [employer]. Co-pay $0, DED $0/$0 met, out of pocket $0/$0 met, co-insurance 0%. YES pre-authorization required. 07/21/2024 @ 1:40pm, spoke with Delilah, REF# [id number] 07/21/2024.  TCR/ICR? ICR Visit(date of service)limitation? No Can multiple codes be used on the same date of service/visit?(IF ITS A LIMIT) N/A  Is this a lifetime maximum or an annual maximum? Annual Has the member used any of these services to date? No Is there a time limit (weeks/months) on start of program and/or program completion? No

## 2024-07-22 ENCOUNTER — Ambulatory Visit: Payer: Self-pay | Admitting: Cardiology

## 2024-07-23 ENCOUNTER — Telehealth (HOSPITAL_COMMUNITY): Payer: Self-pay

## 2024-07-23 NOTE — Telephone Encounter (Signed)
 Called patient to see if he was interested in participating in the Cardiac Rehab Program. Patient will come in for orientation on 2/17 and will attend the 8:15 exercise class.  Pensions consultant.   **Patient is aware his insurance may or may not approve prior auth after his orientation treatment plan is sent to them, he is aware we may have to delay him starting class until they approve or cancel if they do not approve it**

## 2024-07-23 NOTE — Progress Notes (Signed)
 Remote ICD Transmission

## 2024-07-24 ENCOUNTER — Other Ambulatory Visit: Payer: Self-pay | Admitting: Family Medicine

## 2024-08-06 ENCOUNTER — Telehealth (HOSPITAL_COMMUNITY): Payer: Self-pay | Admitting: Cardiology

## 2024-08-06 NOTE — Telephone Encounter (Signed)
 Called to confirm/remind patient of their appointment at the Advanced Heart Failure Clinic on 08/06/2024.   Appointment:   [x] Confirmed  [] Left mess   [] No answer/No voice mail  [] VM Full/unable to leave message  [] Phone not in service  Patient reminded to bring all medications and/or complete list.  Confirmed patient has transportation. Gave directions, instructed to utilize valet parking.

## 2024-08-09 ENCOUNTER — Ambulatory Visit (HOSPITAL_COMMUNITY)

## 2024-08-09 ENCOUNTER — Ambulatory Visit (HOSPITAL_COMMUNITY): Admitting: Cardiology

## 2024-08-10 ENCOUNTER — Ambulatory Visit: Admitting: Cardiology

## 2024-08-17 ENCOUNTER — Encounter (HOSPITAL_COMMUNITY)

## 2024-08-23 ENCOUNTER — Encounter (HOSPITAL_COMMUNITY)

## 2024-08-25 ENCOUNTER — Encounter (HOSPITAL_COMMUNITY)

## 2024-08-27 ENCOUNTER — Encounter (HOSPITAL_COMMUNITY)

## 2024-08-30 ENCOUNTER — Encounter (HOSPITAL_COMMUNITY)

## 2024-09-01 ENCOUNTER — Encounter (HOSPITAL_COMMUNITY)

## 2024-09-03 ENCOUNTER — Encounter (HOSPITAL_COMMUNITY)

## 2024-09-06 ENCOUNTER — Encounter (HOSPITAL_COMMUNITY)

## 2024-09-08 ENCOUNTER — Encounter (HOSPITAL_COMMUNITY)

## 2024-09-10 ENCOUNTER — Encounter (HOSPITAL_COMMUNITY)

## 2024-09-13 ENCOUNTER — Encounter (HOSPITAL_COMMUNITY)

## 2024-09-15 ENCOUNTER — Encounter (HOSPITAL_COMMUNITY)

## 2024-09-17 ENCOUNTER — Encounter (HOSPITAL_COMMUNITY)

## 2024-09-20 ENCOUNTER — Encounter (HOSPITAL_COMMUNITY)

## 2024-09-22 ENCOUNTER — Encounter (HOSPITAL_COMMUNITY)

## 2024-09-24 ENCOUNTER — Encounter (HOSPITAL_COMMUNITY)

## 2024-09-27 ENCOUNTER — Encounter (HOSPITAL_COMMUNITY)

## 2024-09-28 ENCOUNTER — Other Ambulatory Visit

## 2024-09-29 ENCOUNTER — Encounter (HOSPITAL_COMMUNITY)

## 2024-10-01 ENCOUNTER — Encounter (HOSPITAL_COMMUNITY)

## 2024-10-04 ENCOUNTER — Encounter (HOSPITAL_COMMUNITY)

## 2024-10-05 ENCOUNTER — Ambulatory Visit: Admitting: Family Medicine

## 2024-10-06 ENCOUNTER — Encounter (HOSPITAL_COMMUNITY)

## 2024-10-08 ENCOUNTER — Encounter (HOSPITAL_COMMUNITY)

## 2024-10-11 ENCOUNTER — Encounter (HOSPITAL_COMMUNITY)

## 2024-10-13 ENCOUNTER — Encounter (HOSPITAL_COMMUNITY)

## 2024-10-15 ENCOUNTER — Encounter (HOSPITAL_COMMUNITY)

## 2024-10-18 ENCOUNTER — Encounter (HOSPITAL_COMMUNITY)

## 2024-10-18 ENCOUNTER — Ambulatory Visit

## 2024-10-20 ENCOUNTER — Encounter (HOSPITAL_COMMUNITY)

## 2024-10-22 ENCOUNTER — Encounter (HOSPITAL_COMMUNITY)

## 2024-10-25 ENCOUNTER — Encounter (HOSPITAL_COMMUNITY)

## 2024-10-27 ENCOUNTER — Encounter (HOSPITAL_COMMUNITY)

## 2024-10-29 ENCOUNTER — Encounter (HOSPITAL_COMMUNITY)

## 2024-11-01 ENCOUNTER — Encounter (HOSPITAL_COMMUNITY)

## 2024-11-03 ENCOUNTER — Encounter (HOSPITAL_COMMUNITY)

## 2024-11-05 ENCOUNTER — Encounter (HOSPITAL_COMMUNITY)

## 2024-11-08 ENCOUNTER — Encounter (HOSPITAL_COMMUNITY)

## 2024-11-10 ENCOUNTER — Encounter (HOSPITAL_COMMUNITY)

## 2025-01-17 ENCOUNTER — Ambulatory Visit

## 2025-04-18 ENCOUNTER — Ambulatory Visit

## 2025-07-18 ENCOUNTER — Ambulatory Visit

## 2025-10-17 ENCOUNTER — Ambulatory Visit
